# Patient Record
Sex: Female | Born: 1955 | Race: Black or African American | Hispanic: No | Smoking: Never smoker
Health system: Southern US, Community
[De-identification: ages and names within clinical notes are randomized; demographics above are authoritative.]

## PROBLEM LIST (undated history)

## (undated) DIAGNOSIS — J939 Pneumothorax, unspecified: Secondary | ICD-10-CM

## (undated) DIAGNOSIS — I10 Essential (primary) hypertension: Secondary | ICD-10-CM

## (undated) DIAGNOSIS — F0390 Unspecified dementia without behavioral disturbance: Secondary | ICD-10-CM

## (undated) DIAGNOSIS — J45909 Unspecified asthma, uncomplicated: Secondary | ICD-10-CM

## (undated) DIAGNOSIS — J449 Chronic obstructive pulmonary disease, unspecified: Secondary | ICD-10-CM

## (undated) HISTORY — PX: CHEST TUBE INSERTION: SHX231

---

## 2018-01-15 ENCOUNTER — Emergency Department (HOSPITAL_COMMUNITY): Payer: PRIVATE HEALTH INSURANCE

## 2018-01-15 ENCOUNTER — Inpatient Hospital Stay (HOSPITAL_COMMUNITY)
Admission: EM | Admit: 2018-01-15 | Discharge: 2018-01-17 | DRG: 201 | Disposition: A | Discharge status: Home / Self Care | Payer: PRIVATE HEALTH INSURANCE | Attending: Surgery | Admitting: Surgery

## 2018-01-15 ENCOUNTER — Inpatient Hospital Stay (HOSPITAL_COMMUNITY): Payer: PRIVATE HEALTH INSURANCE

## 2018-01-15 ENCOUNTER — Encounter (HOSPITAL_COMMUNITY): Payer: Self-pay | Admitting: Emergency Medicine

## 2018-01-15 DIAGNOSIS — I444 Left anterior fascicular block: Secondary | ICD-10-CM | POA: Diagnosis present

## 2018-01-15 DIAGNOSIS — Z79899 Other long term (current) drug therapy: Secondary | ICD-10-CM

## 2018-01-15 DIAGNOSIS — F0390 Unspecified dementia without behavioral disturbance: Secondary | ICD-10-CM | POA: Diagnosis present

## 2018-01-15 DIAGNOSIS — Z6827 Body mass index (BMI) 27.0-27.9, adult: Secondary | ICD-10-CM

## 2018-01-15 DIAGNOSIS — E668 Other obesity: Secondary | ICD-10-CM | POA: Diagnosis present

## 2018-01-15 DIAGNOSIS — R110 Nausea: Secondary | ICD-10-CM | POA: Diagnosis not present

## 2018-01-15 DIAGNOSIS — J9311 Primary spontaneous pneumothorax: Secondary | ICD-10-CM | POA: Diagnosis present

## 2018-01-15 DIAGNOSIS — J939 Pneumothorax, unspecified: Secondary | ICD-10-CM | POA: Diagnosis present

## 2018-01-15 DIAGNOSIS — J449 Chronic obstructive pulmonary disease, unspecified: Secondary | ICD-10-CM | POA: Diagnosis present

## 2018-01-15 DIAGNOSIS — I10 Essential (primary) hypertension: Secondary | ICD-10-CM | POA: Diagnosis present

## 2018-01-15 HISTORY — DX: Unspecified dementia, unspecified severity, without behavioral disturbance, psychotic disturbance, mood disturbance, and anxiety: F03.90

## 2018-01-15 HISTORY — DX: Pneumothorax, unspecified: J93.9

## 2018-01-15 HISTORY — DX: Essential (primary) hypertension: I10

## 2018-01-15 LAB — CBC WITH DIFFERENTIAL/PLATELET
ABS IMMATURE GRANULOCYTES: 0 10*3/uL (ref 0.0–0.1)
BASOS PCT: 1 %
Basophils Absolute: 0.1 10*3/uL (ref 0.0–0.1)
EOS ABS: 0.5 10*3/uL (ref 0.0–0.7)
Eosinophils Relative: 5 %
HEMATOCRIT: 47.5 % — AB (ref 36.0–46.0)
Hemoglobin: 14.9 g/dL (ref 12.0–15.0)
IMMATURE GRANULOCYTES: 0 %
Lymphocytes Relative: 27 %
Lymphs Abs: 2.5 10*3/uL (ref 0.7–4.0)
MCH: 29.9 pg (ref 26.0–34.0)
MCHC: 31.4 g/dL (ref 30.0–36.0)
MCV: 95.2 fL (ref 78.0–100.0)
MONO ABS: 1 10*3/uL (ref 0.1–1.0)
MONOS PCT: 11 %
NEUTROS ABS: 5.3 10*3/uL (ref 1.7–7.7)
Neutrophils Relative %: 56 %
PLATELETS: 261 10*3/uL (ref 150–400)
RBC: 4.99 MIL/uL (ref 3.87–5.11)
RDW: 12.2 % (ref 11.5–15.5)
WBC: 9.4 10*3/uL (ref 4.0–10.5)

## 2018-01-15 LAB — TROPONIN I: Troponin I: 0.03 ng/mL (ref <0.03)

## 2018-01-15 LAB — COMPREHENSIVE METABOLIC PANEL
ALT: 15 U/L (ref 0–44)
ANION GAP: 12 (ref 5–15)
AST: 26 U/L (ref 15–41)
Albumin: 3.9 g/dL (ref 3.5–5.0)
Alkaline Phosphatase: 110 U/L (ref 38–126)
BILIRUBIN TOTAL: 0.4 mg/dL (ref 0.3–1.2)
BUN: 14 mg/dL (ref 8–23)
CALCIUM: 9.5 mg/dL (ref 8.9–10.3)
CO2: 24 mmol/L (ref 22–32)
Chloride: 105 mmol/L (ref 98–111)
Creatinine, Ser: 0.9 mg/dL (ref 0.44–1.00)
Glucose, Bld: 149 mg/dL — ABNORMAL HIGH (ref 70–99)
POTASSIUM: 3.1 mmol/L — AB (ref 3.5–5.1)
Sodium: 141 mmol/L (ref 135–145)
TOTAL PROTEIN: 8.3 g/dL — AB (ref 6.5–8.1)

## 2018-01-15 LAB — BRAIN NATRIURETIC PEPTIDE: B Natriuretic Peptide: 17.8 pg/mL (ref 0.0–100.0)

## 2018-01-15 LAB — I-STAT TROPONIN, ED: Troponin i, poc: 0.01 ng/mL (ref 0.00–0.08)

## 2018-01-15 LAB — PROTIME-INR
INR: 1.07
PROTHROMBIN TIME: 13.8 s (ref 11.4–15.2)

## 2018-01-15 MED ORDER — LIDOCAINE HCL (PF) 2 % IJ SOLN
INTRAMUSCULAR | Status: AC
Start: 2018-01-15 — End: 2018-01-15
  Filled 2018-01-15: qty 2

## 2018-01-15 MED ORDER — MORPHINE SULFATE (PF) 4 MG/ML IV SOLN
2.0000 mg | Freq: Once | INTRAVENOUS | Status: AC
  Administered 2018-01-15: 2 mg via INTRAVENOUS
  Filled 2018-01-15: qty 1

## 2018-01-15 MED ORDER — FENTANYL CITRATE (PF) 100 MCG/2ML IJ SOLN
INTRAMUSCULAR | Status: AC | PRN
  Administered 2018-01-15: 100 ug via INTRAVENOUS

## 2018-01-15 MED ORDER — ETOMIDATE 2 MG/ML IV SOLN
5.0000 mg | Freq: Once | INTRAVENOUS | Status: AC
  Administered 2018-01-15: 5 mg via INTRAVENOUS
  Filled 2018-01-15: qty 10

## 2018-01-15 MED ORDER — FENTANYL CITRATE (PF) 100 MCG/2ML IJ SOLN
INTRAMUSCULAR | Status: AC
  Filled 2018-01-15: qty 2

## 2018-01-15 MED ORDER — OXYCODONE HCL 5 MG PO TABS
5.0000 mg | ORAL_TABLET | ORAL | Status: DC | PRN
  Administered 2018-01-15 – 2018-01-17 (×3): 5 mg via ORAL
  Filled 2018-01-15 (×3): qty 1

## 2018-01-15 MED ORDER — FENTANYL CITRATE (PF) 100 MCG/2ML IJ SOLN
50.0000 ug | Freq: Once | INTRAMUSCULAR | Status: AC
Start: 2018-01-15 — End: 2018-01-15
  Administered 2018-01-15: 50 ug via INTRAVENOUS
  Filled 2018-01-15: qty 2

## 2018-01-15 MED ORDER — ENOXAPARIN SODIUM 40 MG/0.4ML ~~LOC~~ SOLN
40.0000 mg | SUBCUTANEOUS | Status: DC
  Administered 2018-01-15 – 2018-01-16 (×2): 40 mg via SUBCUTANEOUS
  Filled 2018-01-15 (×2): qty 0.4

## 2018-01-15 MED ORDER — ALBUTEROL SULFATE (2.5 MG/3ML) 0.083% IN NEBU
5.0000 mg | INHALATION_SOLUTION | Freq: Once | RESPIRATORY_TRACT | Status: DC
  Filled 2018-01-15: qty 6

## 2018-01-15 MED ORDER — ETOMIDATE 2 MG/ML IV SOLN
INTRAVENOUS | Status: AC
Start: 2018-01-15 — End: 2018-01-15
  Filled 2018-01-15: qty 10

## 2018-01-15 MED ORDER — ETOMIDATE 2 MG/ML IV SOLN
INTRAVENOUS | Status: AC | PRN
  Administered 2018-01-15 (×2): 5 mg via INTRAVENOUS

## 2018-01-15 MED ORDER — SODIUM CHLORIDE 0.9% FLUSH
3.0000 mL | Freq: Two times a day (BID) | INTRAVENOUS | Status: DC
  Administered 2018-01-15 – 2018-01-16 (×4): 3 mL via INTRAVENOUS

## 2018-01-15 NOTE — ED Notes (Signed)
Upon entry to room chest tube noted to be out of bandages on R axillary area. MD notified, Pt in NAD, VSS.

## 2018-01-15 NOTE — ED Notes (Signed)
Spoke with Dr Laneta SimmersBartle and updated on trop and x-ray results.  He will place new admit order.

## 2018-01-15 NOTE — ED Notes (Signed)
Attempted report 

## 2018-01-15 NOTE — Progress Notes (Signed)
Pt arrived with neb tx running. Per EMS, tx had just been started. Lung sounds clear/diminished on L, Absent on R. Post tx, pt placed on 4L [place]. O2 increased to 6L per SpO2 low 90's.

## 2018-01-15 NOTE — ED Notes (Signed)
Dr Laneta SimmersBartle paged and responded.  Orders placed.

## 2018-01-15 NOTE — ED Provider Notes (Signed)
[facility] Provider Note   CSN: [id number] Arrival date & time: 01/15/18  0215     History   Chief Complaint Chief Complaint  Patient presents with  . Respiratory Distress   Level 5 caveat due to acuity of condition HPI Carolyn Huynh is a 62 y.o. female.  The history is provided by the patient and a relative.  Shortness of Breath  This is a new problem. Duration: Just prior to arrival. The problem has been rapidly worsening. Associated symptoms include cough. She has had prior hospitalizations. Associated medical issues include COPD.  Patient presents with shortness of breath.  Per family, she had an episode of coughing and then became acutely short of breath. She had been at her baseline.  [place] and [place] no local care.  Family reports she has mild dementia, and has had lung issues before.  Family thinks she has had partial lung surgery before.  She has  also required chest tube previously  Past Medical History:  Diagnosis Date  . Dementia   . Hypertension   . Pneumothorax     There are no active problems to display for this patient.   Past Surgical History:  Procedure Laterality Date  . CHEST TUBE INSERTION       OB History   None      Home Medications    Prior to Admission medications   Medication Sig Start Date End Date Taking? Authorizing Provider  albuterol (PROVENTIL HFA;VENTOLIN HFA) 108 (90 Base) MCG/ACT inhaler Inhale 1-2 puffs into the lungs every 6 (six) hours as needed for wheezing or shortness of breath.   Yes [provider]    Family History No family history on file.  Social History Social History   Tobacco Use  . Smoking status: Never Smoker  . Smokeless tobacco: Never Used  Substance Use Topics  . Alcohol use: Not Currently  . Drug use: Never     Allergies   Patient has no known allergies.   Review of Systems Review of Systems  Unable to  perform ROS: Acuity of condition  Respiratory: Positive for cough and shortness of breath.      Physical Exam Updated Vital Signs BP (!) 162/86 (BP Location: Right Arm)   Pulse (!) 122   Temp 98 F (36.7 C) (Oral)   Resp (!) 2   Wt 77.1 kg (170 lb)   SpO2 92%   Physical Exam CONSTITUTIONAL: Elderly, respiratory distress noted HEAD: Normocephalic/atraumatic EYES: EOMI ENMT: Mucous membranes moist NECK: supple no meningeal signs SPINE/BACK:entire spine nontender CV: Tachycardic LUNGS: Tachypnea, decreased breath sounds on the right, wheezing left lungs ABDOMEN: soft, nontender, obese GU:no cva tenderness NEURO: Pt is awake/alert/appropriate, moves all extremitiesx4.   EXTREMITIES: pulses normal/equal, full ROM SKIN: warm, color normal PSYCH: anxious  ED Treatments / Results  Labs (all labs ordered are listed, but only abnormal results are displayed) Labs Reviewed  CBC WITH DIFFERENTIAL/PLATELET - Abnormal; Notable for the following components:      Result Value   HCT 47.5 (*)    All other components within normal limits  COMPREHENSIVE METABOLIC PANEL - Abnormal; Notable for the following components:   Potassium 3.1 (*)    Glucose, Bld 149 (*)    Total Protein 8.3 (*)    All other components within normal limits  BRAIN NATRIURETIC PEPTIDE  PROTIME-INR  I-STAT TROPONIN, ED    EKG EKG Interpretation  Date/Time:  Saturday January 15 2018 05:35:58 EDT Ventricular Rate:  84 PR Interval:    QRS Duration: 88 QT Interval:  377 QTC Calculation: 446 R Axis:   -59 Text Interpretation:  Sinus rhythm Left anterior fascicular block Abnormal R-wave progression, late transition Borderline T abnormalities, anterior leads No previous ECGs available Confirmed by Zadie Rhine ([id number]) on 01/15/2018 6:18:08 AM   Radiology Dg Chest Port 1 View  Result Date: 01/15/2018 CLINICAL DATA:  Chest tube came out. EXAM: PORTABLE CHEST 1 VIEW COMPARISON:  Radiographs earlier this day.  FINDINGS: Right-sided chest tube is no longer present. Small right apical and lateral pneumothorax, slightly increased in size from prior exam, with small inferior component not visualized. No recurrent mediastinal shift. Again seen streaky right basilar opacities. Unchanged heart size and mediastinal contours. Postsurgical change in the left lung with patchy opacities in the perihilar region. IMPRESSION: 1. Slight increased size of right pneumothorax after chest tube came out, remains small in size. Recommend continued radiographic follow-up. 2. Improving right basilar atelectasis. 3. Patchy opacities throughout the left lung, unchanged from most recent comparison. Electronically Signed   By: Rubye Oaks M.D.   On: 01/15/2018 06:23   Dg Chest Port 1 View  Result Date: 01/15/2018 CLINICAL DATA:  Pneumothorax, chest tube placement. EXAM: PORTABLE CHEST 1 VIEW COMPARISON:  Radiograph earlier this day. FINDINGS: Right-sided chest tube in place with significant decreased size of right pneumothorax. The side port of the chest tube is likely in the chest wall. Small residual pneumothorax apical and laterally. Improved and near completely resolved mediastinal shift. Streaky right basilar opacities. Surgical sutures in the left perihilar region. Patchy opacity throughout the left lung, with some improvement. IMPRESSION: 1. Significant decreased size right pneumothorax post chest tube placement with small residual apical and lateral component. Near completely resolved mediastinal shift. The side port of the chest tube is likely within the soft tissues, recommend repositioning. 2. Streaky right lower lobe opacity is likely atelectasis. 3. Patchy opacities throughout the left lung with improvement, likely improving atelectasis. Electronically Signed   By: Rubye Oaks M.D.   On: 01/15/2018 05:11   Dg Chest Portable 1 View  Result Date: 01/15/2018 CLINICAL DATA:  Shortness of breath. EXAM: PORTABLE CHEST 1 VIEW  COMPARISON:  None. FINDINGS: Large right sided pneumothorax with right lung collapse down medially. There does appear to be some tethering a in the right upper lung. Cardiomediastinal anatomy is shifted to the left. Left hemidiaphragm is elevated. There are fairly extensive staple lines in the left upper lung suggesting prior wedge resection or lobectomy. Patchy opacity in the left lung may be atelectatic although infection possible. The visualized bony structures of the thorax are intact. Telemetry leads overlie the chest. IMPRESSION: Large right pneumothorax with shift of cardiomediastinal anatomy to the left, raising concern for tension pneumothorax. Postsurgical changes in the left lung with diffuse patchy left lung opacity potentially atelectasis. Critical Value/emergent results were called by me at the time of interpretation on 01/15/2018 at 2:54 am to Dr. Zadie Rhine , who verbally acknowledged these results. Electronically Signed   By: Kennith Center M.D.   On: 01/15/2018 02:55    Procedures .Sedation Date/Time: 01/15/2018 3:42 AM Performed by: Zadie Rhine, MD Authorized by: Zadie Rhine, MD   Consent:    Consent obtained:  Written   Consent given by:  Patient   Alternatives discussed:  Analgesia without sedation Universal protocol:    Immediately prior to procedure a time out was called: yes   Indications:  Procedure performed:  Chest tube placement   Procedure necessitating sedation performed by:  Physician performing sedation   Intended level of sedation:  Deep Pre-sedation assessment:    Time since last food or drink:  4 hrs   ASA classification: class 2 - patient with mild systemic disease     Neck mobility: normal     Mouth opening:  2 finger widths   Mallampati score:  II - soft palate, uvula, fauces visible   Pre-sedation assessments completed and reviewed: cardiovascular function, mental status and pain level     Pre-sedation assessment completed:  01/15/2018 3:43  AM Immediate pre-procedure details:    Reassessment: Patient reassessed immediately prior to procedure     Reviewed: vital signs, relevant labs/tests and NPO status     Verified: bag valve mask available   Procedure details (see MAR for exact dosages):    Sedation:  Etomidate   Analgesia:  Fentanyl   Total Provider sedation time (minutes):  20 Post-procedure details:    Post-sedation assessment completed:  01/15/2018 4:50 AM   Attendance: Constant attendance by certified staff until patient recovered     Recovery: Patient returned to pre-procedure baseline     Post-sedation assessments completed and reviewed: airway patency, cardiovascular function, mental status, nausea/vomiting and respiratory function     Patient is stable for discharge or admission: yes     Patient tolerance:  Tolerated well, no immediate complications CHEST TUBE INSERTION Date/Time: 01/15/2018 3:43 AM Performed by: Zadie Rhine, MD Authorized by: Zadie Rhine, MD   Consent:    Consent obtained:  Written   Consent given by:  Patient   Alternatives discussed:  No treatment Pre-procedure details:    Skin preparation:  ChloraPrep Sedation:    Sedation type:  Deep Anesthesia (see MAR for exact dosages):    Anesthesia method:  Local infiltration   Local anesthetic:  Lidocaine 2% w/o epi Procedure details:    Placement location:  R lateral   Scalpel size:  11   Tube size (Fr):  20   Dissection instrument:  Kelly clamp and finger   Ultrasound guidance: no     Drainage characteristics:  Air only   Suture material:  0 silk   Dressing:  Xeroform gauze Post-procedure details:    Patient tolerance of procedure:  Tolerated well, no immediate complications  CRITICAL CARE Performed by: Joya Gaskins Total critical care time: 45 minutes Critical care time was exclusive of separately billable procedures and treating other patients. Critical care was necessary to treat or prevent imminent or life-threatening  deterioration. Critical care was time spent personally by me on the following activities: development of treatment plan with patient and/or surrogate as well as nursing, discussions with consultants, evaluation of patient's response to treatment, examination of patient, obtaining history from patient or surrogate, ordering and performing treatments and interventions, ordering and review of laboratory studies, ordering and review of radiographic studies, pulse oximetry and re-evaluation of patient's condition.   Medications Ordered in ED Medications  albuterol (PROVENTIL) (2.5 MG/3ML) 0.083% nebulizer solution 5 mg (5 mg Nebulization Not Given 01/15/18 0441)  lidocaine (XYLOCAINE) 2 % injection (has no administration in time range)  fentaNYL (SUBLIMAZE) 100 MCG/2ML injection (has no administration in time range)  fentaNYL (SUBLIMAZE) injection 50 mcg (50 mcg Intravenous Given 01/15/18 0343)  etomidate (AMIDATE) injection 5 mg (5 mg Intravenous Given 01/15/18 0356)  etomidate (AMIDATE) injection (5 mg Intravenous Given 01/15/18 0406)  fentaNYL (SUBLIMAZE) injection (100 mcg Intravenous Given 01/15/18 0410)  Initial Impression / Assessment and Plan / ED Course  I have reviewed the triage vital signs and the nursing notes.  Pertinent labs & imaging results that were available during my care of the patient were reviewed by me and considered in my medical decision making (see chart for details).     Patient seen on arrival I immediately recognized that she had a right large pneumothorax. She was placed on oxygen, and I consulted cardiothoracic surgery Dr. Laneta Simmers After discussion, we agreed to have me proceed with placing a 20 French tube thoracostomy.  Patient and family were able to consent for this procedure.  I used etomidate for procedural sedation.  Patient tolerated sedation and procedure very well.  A rush of air was noticed when I placed the chest tube and patient's vital signs immediately  improved. I  Was able to secure the tube without difficulty.  However while waiting in the ED the tube fell out movement.  Patient has had no worsening.  Her vital signs are appropriate.  Repeat chest x-ray shows small pneumothorax.  She is not hypoxic or tachycardic.  Discussed the case with Dr. Laneta Simmers, we will elect not to replace the tube at this time.  She will need to have serial radiographs.  He also mentioned that since she has had multiple tube thoracostomies previously she may end up needing a VATS procedure Location of the tube was left with sutures in place.  Patient is resting comfortably awaiting admission Final Clinical Impressions(s) / ED Diagnoses   Final diagnoses:  Primary spontaneous pneumothorax    ED Discharge Orders    None       Zadie Rhine, MD 01/15/18 [phone]

## 2018-01-15 NOTE — ED Notes (Signed)
Lunch tray ordered 

## 2018-01-15 NOTE — ED Triage Notes (Signed)
Pt transported from home by [facility], per EMS pt c/o shob tonight, no lung sounds can be heard on R side, # 18 est, Albuterol 15mg , atr 1, Solumedrol 125mg , Mg 2gm given. Pt baseline confused, per daughter they just moved to the area d/t dementia. Hx of pneumothorax

## 2018-01-15 NOTE — H&P (Signed)
Carolyn Huynh is an 62 y.o. female.   Chief Complaint: Spontaneous right pneumothorax HPI:   The patient is a 62 year old woman with no prior smoking history who presented to the emergency room overnight after developing shortness of breath following a coughing episode.  A chest x-ray showed a large right pneumothorax with some component of tension and shift of the mediastinum to the left.  A chest tube was inserted by the emergency room physician.  A follow-up chest x-ray showed near complete resolution of the pneumothorax but unfortunately due to the laxity of the patient's chest wall tissue when she sat up the chest tube was essentially out of the pleural space.  Since the pneumothorax was nearly completely resolved the tube was removed.  She has had a couple follow-up chest x-rays since then which have shown no reaccumulation of the pneumothorax and it is about 10% on the most recent chest x-ray.  She also developed some substernal chest discomfort in the emergency room and had an electrocardiogram that was unremarkable and negative troponin level x2.  Her chest discomfort resolved with pain medicine.  She reportedly had some family present with her in the emergency room but family is not here at this time.  The patient reports that she had a chest tube in the past and also had surgery on her left chest in 2000.  She does not remember any of the details of this and does have some history of dementia.  Past Medical History:  Diagnosis Date  . Dementia   . Hypertension   . Pneumothorax     Past Surgical History:  Procedure Laterality Date  . CHEST TUBE INSERTION      No family history on file. Social History:  reports that she has never smoked. She has never used smokeless tobacco. She reports that she drank alcohol. She reports that she does not use drugs.  Allergies: No Known Allergies  Medications Prior to Admission  Medication Sig Dispense Refill  . albuterol (PROVENTIL  HFA;VENTOLIN HFA) 108 (90 Base) MCG/ACT inhaler Inhale 1-2 puffs into the lungs every 6 (six) hours as needed for wheezing or shortness of breath.      Results for orders placed or performed during the hospital encounter of 01/15/18 (from the past 48 hour(s))  I-Stat Troponin, ED (not at [facility])     Status: None   Collection Time: 01/15/18  2:33 AM  Result Value Ref Range   Troponin i, poc 0.01 0.00 - 0.08 ng/mL   Comment 3            Comment: Due to the release kinetics of cTnI, a negative result within the first hours of the onset of symptoms does not rule out myocardial infarction with certainty. If myocardial infarction is still suspected, repeat the test at appropriate intervals.   CBC with Differential     Status: Abnormal   Collection Time: 01/15/18  2:35 AM  Result Value Ref Range   WBC 9.4 4.0 - 10.5 K/uL   RBC 4.99 3.87 - 5.11 MIL/uL   Hemoglobin 14.9 12.0 - 15.0 g/dL   HCT 47.5 (H) 36.0 - 46.0 %   MCV 95.2 78.0 - 100.0 fL   MCH 29.9 26.0 - 34.0 pg   MCHC 31.4 30.0 - 36.0 g/dL   RDW 12.2 11.5 - 15.5 %   Platelets 261 150 - 400 K/uL   Neutrophils Relative % 56 %   Neutro Abs 5.3 1.7 - 7.7 K/uL   Lymphocytes Relative  27 %   Lymphs Abs 2.5 0.7 - 4.0 K/uL   Monocytes Relative 11 %   Monocytes Absolute 1.0 0.1 - 1.0 K/uL   Eosinophils Relative 5 %   Eosinophils Absolute 0.5 0.0 - 0.7 K/uL   Basophils Relative 1 %   Basophils Absolute 0.1 0.0 - 0.1 K/uL   Immature Granulocytes 0 %   Abs Immature Granulocytes 0.0 0.0 - 0.1 K/uL    Comment: Performed at [facility], [place] [address], [place], [place] [address]  Brain natriuretic peptide     Status: None   Collection Time: 01/15/18  2:35 AM  Result Value Ref Range   B Natriuretic Peptide 17.8 0.0 - 100.0 pg/mL    Comment: Performed at [facility] [address], [place], [place] [address]  Comprehensive metabolic panel     Status: Abnormal   Collection Time: 01/15/18  2:35 AM  Result Value Ref Range    Sodium 141 135 - 145 mmol/L   Potassium 3.1 (L) 3.5 - 5.1 mmol/L   Chloride 105 98 - 111 mmol/L   CO2 24 22 - 32 mmol/L   Glucose, Bld 149 (H) 70 - 99 mg/dL   BUN 14 8 - 23 mg/dL   Creatinine, Ser 0.90 0.44 - 1.00 mg/dL   Calcium 9.5 8.9 - 10.3 mg/dL   Total Protein 8.3 (H) 6.5 - 8.1 g/dL   Albumin 3.9 3.5 - 5.0 g/dL   AST 26 15 - 41 U/L   ALT 15 0 - 44 U/L   Alkaline Phosphatase 110 38 - 126 U/L   Total Bilirubin 0.4 0.3 - 1.2 mg/dL   GFR calc non Af Amer >60 >60 mL/min   GFR calc Af Amer >60 >60 mL/min    Comment: (NOTE) The eGFR has been calculated using the CKD EPI equation. This calculation has not been validated in all clinical situations. eGFR's persistently <60 mL/min signify possible Chronic Kidney Disease.    Anion gap 12 5 - 15    Comment: Performed at [facility] [address], [place], [place] [address]  Protime-INR     Status: None   Collection Time: 01/15/18  2:43 AM  Result Value Ref Range   Prothrombin Time 13.8 11.4 - 15.2 seconds   INR 1.07     Comment: Performed at [facility] [address], [place], [place] [address]  Troponin I     Status: Abnormal   Collection Time: 01/15/18  8:54 AM  Result Value Ref Range   Troponin I 0.03 (HH) <0.03 ng/mL    Comment: CRITICAL RESULT CALLED TO, READ BACK BY AND VERIFIED WITH: Arville Lime RN AT 1031 01/15/18 BY [facility] Performed at [facility], [place] [address], [place], [place] [address]    Dg Chest Port 1 View  Result Date: 01/15/2018 CLINICAL DATA:  Pneumothorax EXAM: PORTABLE CHEST 1 VIEW COMPARISON:  Chest x-rays from earlier same day. FINDINGS: The small RIGHT apical pneumothorax appears stable compared to the most recent chest x-ray obtained at 8:25 a.m., perhaps slightly smaller compared to the earlier chest x-ray labeled 5:49 a.m. Heart size and mediastinal contours are stable. No new lung findings. Postsurgical changes again noted on the LEFT. IMPRESSION: 1. Small pneumothorax at  the RIGHT lung apex, not significantly changed compared to the most recent chest x-ray obtained at 8:25 a.m., likely smaller compared to the earlier chest x-ray obtained at 5:49 a.m. 2. No new findings. Electronically Signed  By: Franki Cabot M.D.   On: 01/15/2018 12:11   Dg Chest Portable 1 View  Result Date: 01/15/2018 CLINICAL DATA:  Follow-up pneumothorax EXAM: PORTABLE CHEST 1 VIEW COMPARISON:  01/15/2018 FINDINGS: Cardiac shadow is stable. There are changes consistent with small right-sided pneumothorax. The overall appearance is stable. Postsurgical changes on the left are noted with associated scarring. No bony abnormality is noted. IMPRESSION: Stable small right pneumothorax Electronically Signed   By: Inez Catalina M.D.   On: 01/15/2018 08:39   Dg Chest Port 1 View  Result Date: 01/15/2018 CLINICAL DATA:  Chest tube came out. EXAM: PORTABLE CHEST 1 VIEW COMPARISON:  Radiographs earlier this day. FINDINGS: Right-sided chest tube is no longer present. Small right apical and lateral pneumothorax, slightly increased in size from prior exam, with small inferior component not visualized. No recurrent mediastinal shift. Again seen streaky right basilar opacities. Unchanged heart size and mediastinal contours. Postsurgical change in the left lung with patchy opacities in the perihilar region. IMPRESSION: 1. Slight increased size of right pneumothorax after chest tube came out, remains small in size. Recommend continued radiographic follow-up. 2. Improving right basilar atelectasis. 3. Patchy opacities throughout the left lung, unchanged from most recent comparison. Electronically Signed   By: Jeb Levering M.D.   On: 01/15/2018 06:23   Dg Chest Port 1 View  Result Date: 01/15/2018 CLINICAL DATA:  Pneumothorax, chest tube placement. EXAM: PORTABLE CHEST 1 VIEW COMPARISON:  Radiograph earlier this day. FINDINGS: Right-sided chest tube in place with significant decreased size of right pneumothorax. The  side port of the chest tube is likely in the chest wall. Small residual pneumothorax apical and laterally. Improved and near completely resolved mediastinal shift. Streaky right basilar opacities. Surgical sutures in the left perihilar region. Patchy opacity throughout the left lung, with some improvement. IMPRESSION: 1. Significant decreased size right pneumothorax post chest tube placement with small residual apical and lateral component. Near completely resolved mediastinal shift. The side port of the chest tube is likely within the soft tissues, recommend repositioning. 2. Streaky right lower lobe opacity is likely atelectasis. 3. Patchy opacities throughout the left lung with improvement, likely improving atelectasis. Electronically Signed   By: Jeb Levering M.D.   On: 01/15/2018 05:11   Dg Chest Portable 1 View  Result Date: 01/15/2018 CLINICAL DATA:  Shortness of breath. EXAM: PORTABLE CHEST 1 VIEW COMPARISON:  None. FINDINGS: Large right sided pneumothorax with right lung collapse down medially. There does appear to be some tethering a in the right upper lung. Cardiomediastinal anatomy is shifted to the left. Left hemidiaphragm is elevated. There are fairly extensive staple lines in the left upper lung suggesting prior wedge resection or lobectomy. Patchy opacity in the left lung may be atelectatic although infection possible. The visualized bony structures of the thorax are intact. Telemetry leads overlie the chest. IMPRESSION: Large right pneumothorax with shift of cardiomediastinal anatomy to the left, raising concern for tension pneumothorax. Postsurgical changes in the left lung with diffuse patchy left lung opacity potentially atelectasis. Critical Value/emergent results were called by me at the time of interpretation on 01/15/2018 at 2:54 am to Dr. Ripley Fraise , who verbally acknowledged these results. Electronically Signed   By: Misty Stanley M.D.   On: 01/15/2018 02:55    Review of  Systems  Unable to perform ROS: Dementia    Blood pressure (!) 173/85, pulse 91, temperature 98 F (36.7 C), temperature source Oral, resp. rate (!) 29, height '5\' 4"'$  (1.626 m), weight  77.1 kg (170 lb), SpO2 100 %. Physical Exam  Constitutional: She is oriented to person, place, and time. She appears well-developed and well-nourished. No distress.  HENT:  Head: Normocephalic and atraumatic.  Mouth/Throat: Oropharynx is clear and moist.  Eyes: Pupils are equal, round, and reactive to light. EOM are normal.  Neck: Normal range of motion. Neck supple. No JVD present.  Cardiovascular: Normal rate, regular rhythm and normal heart sounds.  No murmur heard. Respiratory: Effort normal and breath sounds normal. No respiratory distress. She has no wheezes. She exhibits no tenderness.  Left posterior lateral thoracotomy scar  GI: Soft. Bowel sounds are normal. She exhibits no distension. There is no tenderness.  Old laparotomy scar  Musculoskeletal: Normal range of motion. She exhibits no edema.  Lymphadenopathy:    She has no cervical adenopathy.  Neurological: She is alert and oriented to person, place, and time.  Skin: Skin is warm and dry.  Psychiatric: She has a normal mood and affect.  She obviously has some dementia.  She has been telling me all kinds of bizarre stories that could not possibly be true.    CLINICAL DATA:  Pneumothorax  EXAM: PORTABLE CHEST 1 VIEW  COMPARISON:  Chest x-rays from earlier same day.  FINDINGS: The small RIGHT apical pneumothorax appears stable compared to the most recent chest x-ray obtained at 8:25 a.m., perhaps slightly smaller compared to the earlier chest x-ray labeled 5:49 a.m.  Heart size and mediastinal contours are stable. No new lung findings. Postsurgical changes again noted on the LEFT.  IMPRESSION: 1. Small pneumothorax at the RIGHT lung apex, not significantly changed compared to the most recent chest x-ray obtained at 8:25 a.m.,  likely smaller compared to the earlier chest x-ray obtained at 5:49 a.m. 2. No new findings.   Electronically Signed   By: Franki Cabot M.D.   On: 01/15/2018 12:11  Assessment/Plan  This 62 year old woman presented with a spontaneous right pneumothorax with complete collapse of the right lung and shift of the mediastinum to the left.  This is been resolved with a chest tube which unfortunately fell out.  Sequential follow-up chest x-rays have shown no reaccumulation of the pneumothorax.  She was admitted for continued observation and will have a follow-up chest x-ray in the morning.  Gaye Pollack, MD 01/15/2018, 1:55 PM

## 2018-01-16 ENCOUNTER — Inpatient Hospital Stay (HOSPITAL_COMMUNITY): Payer: PRIVATE HEALTH INSURANCE

## 2018-01-16 LAB — HIV ANTIBODY (ROUTINE TESTING W REFLEX): HIV Screen 4th Generation wRfx: NONREACTIVE

## 2018-01-16 LAB — GLUCOSE, CAPILLARY: GLUCOSE-CAPILLARY: 104 mg/dL — AB (ref 70–99)

## 2018-01-16 NOTE — Discharge Summary (Signed)
Physician Discharge Summary  Patient ID: Carolyn Huynh MRN: [id number] DOB/AGE: [id number] 64 y.o.  Admit date: 01/15/2018 Discharge date: 01/17/2018  Admission Diagnoses: Patient Active Problem List   Diagnosis Date Noted  . Pneumothorax 01/15/2018    Discharge Diagnoses:  Active Problems:   Pneumothorax   Discharged Condition: good  HPI:  The patient is a 62 year old woman with no prior smoking history who presented to the emergency room overnight after developing shortness of breath following a coughing episode.  A chest x-ray showed a large right pneumothorax with some component of tension and shift of the mediastinum to the left.  A chest tube was inserted by the emergency room physician.  A follow-up chest x-ray showed near complete resolution of the pneumothorax but unfortunately due to the laxity of the patient's chest wall tissue when she sat up the chest tube was essentially out of the pleural space.  Since the pneumothorax was nearly completely resolved the tube was removed.  She has had a couple follow-up chest x-rays since then which have shown no reaccumulation of the pneumothorax and it is about 10% on the most recent chest x-ray.  She also developed some substernal chest discomfort in the emergency room and had an electrocardiogram that was unremarkable and negative troponin level x2.  Her chest discomfort resolved with pain medicine.  She reportedly had some family present with her in the emergency room but family is not here at this time.  The patient reports that she had a chest tube in the past and also had surgery on her left chest in 2000.  She does not remember any of the details of this and does have some history of dementia.    Hospital Course:  Carolyn Huynh is a 62 year old female who was admitted on 01/15/2018 for a pneumothorax with mediastinal shift. A chest tube was inserted nu the emergency room physician however, when the patient sat up the chest tube was pulled out  of the pleural space and subsequently removed. On xray she had near complete resolution of her pneumothorax. We have admitted her for observation. She underwent a chest xray on 01/16/2018 which showed a slightly improved right apical pneumothorax. She is tolerating room air. We encouraged ambulation. On 8/5 her xray was very stable.. Today, she is deemed stable for discharge.    Consults: None  Significant Diagnostic Studies:   CLINICAL DATA:  Pneumothorax.  EXAM: PORTABLE CHEST 1 VIEW  COMPARISON:  01/15/2018  FINDINGS: The cardiomediastinal silhouette is unchanged. Postsurgical changes are again noted in the left upper lung with similar appearance of adjacent lung opacity, presumably scarring. A small right apical pneumothorax has decreased in size. Coarsened appearance of the interstitial markings bilaterally is similar to the prior study and likely reflects an element of chronic lung disease. No sizable pleural effusion is evident.  IMPRESSION: Decreased size of small right apical pneumothorax.   Electronically Signed   By: Sebastian Ache M.D.   On: 01/16/2018 08:04  Treatments:  Right chest tube insertion.   Discharge Exam: Blood pressure (!) 129/59, pulse 61, temperature 98 F (36.7 C), temperature source Oral, resp. rate 14, height 5\' 4"  (1.626 m), weight 73.7 kg (162 lb 7.7 oz), SpO2 94 %.     General appearance: alert, cooperative and no distress Heart: regular rate and rhythm Lungs: clear to auscultation bilaterally Abdomen: soft, non-tender Extremities: no calf tenderness Wound: incis healing well   Disposition: Discharge disposition: 01-Home or Self Care  Discharge Instructions    Discharge patient   Complete by:  As directed    Discharge disposition:  01-Home or Self Care   Discharge patient date:  01/17/2018     Allergies as of 01/17/2018   No Known Allergies     Medication List    TAKE these medications   albuterol 108 (90 Base)  MCG/ACT inhaler Commonly known as:  PROVENTIL HFA;VENTOLIN HFA Inhale 1-2 puffs into the lungs every 6 (six) hours as needed for wheezing or shortness of breath.      Follow-up Information    Alleen BorneBartle, Bryan K, MD Follow up.   Specialty:  Cardiothoracic Surgery Why:  office will contact you.  On the date of the appointment to see Dr. Laneta SimmersBartle obtain a chest [facility] 1/2-hour prior to the appointment.  It is located in the same office complex on the first floor. Contact information: [address] [place] [address] [phone]           Signed: Rowe ClackWayne E Dezmond Downie 01/17/2018, 7:38 AM

## 2018-01-16 NOTE — Progress Notes (Addendum)
      [address]       [place] [id number]             [phone]         Subjective: Had some pain yesterday but the pain medication helped  Objective: Vital signs in last 24 hours: Temp:  [97.9 F (36.6 C)-98.1 F (36.7 C)] 97.9 F (36.6 C) (08/04 0456) Pulse Rate:  [70-95] 74 (08/04 0458) Cardiac Rhythm: Normal sinus rhythm (08/04 0710) Resp:  [17-33] 26 (08/04 0458) BP: (110-173)/(53-85) 120/53 (08/04 0456) SpO2:  [97 %-100 %] 97 % (08/04 0458) Weight:  [74.2 kg (163 lb 9.3 oz)] 74.2 kg (163 lb 9.3 oz) (08/04 0458)     Intake/Output from previous day: 08/03 0701 - 08/04 0700 In: 960 [P.O.:960] Out: -  Intake/Output this shift: No intake/output data recorded.  General appearance: alert, cooperative and no distress Heart: regular rate and rhythm, S1, S2 normal, no murmur, click, rub or gallop Lungs: clear to auscultation bilaterally Abdomen: soft, non-tender; bowel sounds normal; no masses,  no organomegaly Extremities: extremities normal, atraumatic, no cyanosis or edema Wound: chest tube site is c/d/i  Lab Results: Recent Labs    01/15/18 0235  WBC 9.4  HGB 14.9  HCT 47.5*  PLT 261   BMET:  Recent Labs    01/15/18 0235  NA 141  K 3.1*  CL 105  CO2 24  GLUCOSE 149*  BUN 14  CREATININE 0.90  CALCIUM 9.5    PT/INR:  Recent Labs    01/15/18 0243  LABPROT 13.8  INR 1.07   ABG No results found for: PHART, HCO3, TCO2, ACIDBASEDEF, O2SAT CBG (last 3)  Recent Labs    01/16/18 0642  GLUCAP 104*    Assessment/Plan: 1. CV-NSR in the 60s, BP well controlled 2. Pulm-tolerating room air with good oxygen saturation. CXR today shows decreased size of the small right apical pneumothorax. Continue albuterol nebs PRN 3. Blood glucose level well controlled 4. Continue lovenox for DVT prophylaxis.   Plan: CXR this morning is stable. Will plan to get another CXR in the morning and discharge if stable. Of note, the patient is asking  to shower today.    LOS: 1 day    Sharlene Doryessa N Conte 01/16/2018   Chart reviewed, patient examined, agree with above. CXR is stable with small right apical ptx.  She denies chest pain.

## 2018-01-17 ENCOUNTER — Inpatient Hospital Stay (HOSPITAL_COMMUNITY): Payer: PRIVATE HEALTH INSURANCE

## 2018-01-17 LAB — GLUCOSE, CAPILLARY: Glucose-Capillary: 84 mg/dL (ref 70–99)

## 2018-01-17 NOTE — Progress Notes (Signed)
Vitals stable and iv removed and intact. Pt educated and discharged with instructions. Pt has purse and belongings including glasses. Pt tx via wheelchair to meet family. Vitals stable and iv removed and intact.  Lacy DuverneyJennifer Hazeline Charnley, RN

## 2018-01-17 NOTE — Progress Notes (Signed)
[address]       [name] [address]             [phone]         Subjective: Mild nausea, o/w feels pretty well, minor shortness of breath at times  Objective: Vital signs in last 24 hours: Temp:  [98 F (36.7 C)-98.2 F (36.8 C)] 98 F (36.7 C) (08/05 0536) Pulse Rate:  [61-79] 61 (08/05 0536) Cardiac Rhythm: Normal sinus rhythm (08/04 1936) Resp:  [14-26] 14 (08/05 0536) BP: (129-159)/(59-88) 129/59 (08/05 0536) SpO2:  [94 %-100 %] 94 % (08/05 0536) Weight:  [73.7 kg (162 lb 7.7 oz)] 73.7 kg (162 lb 7.7 oz) (08/05 0536)  Hemodynamic parameters for last 24 hours:    Intake/Output from previous day: 08/04 0701 - 08/05 0700 In: 960 [P.O.:960] Out: -  Intake/Output this shift: No intake/output data recorded.  General appearance: alert, cooperative and no distress Heart: regular rate and rhythm Lungs: clear to auscultation bilaterally Abdomen: soft, non-tender Extremities: no calf tenderness Wound: incis healing well  Lab Results: Recent Labs    01/15/18 0235  WBC 9.4  HGB 14.9  HCT 47.5*  PLT 261   BMET:  Recent Labs    01/15/18 0235  NA 141  K 3.1*  CL 105  CO2 24  GLUCOSE 149*  BUN 14  CREATININE 0.90  CALCIUM 9.5    PT/INR:  Recent Labs    01/15/18 0243  LABPROT 13.8  INR 1.07   ABG No results found for: PHART, HCO3, TCO2, ACIDBASEDEF, O2SAT CBG (last 3)  Recent Labs    01/16/18 0642 01/17/18 0624  GLUCAP 104* 84    Meds Scheduled Meds: . albuterol  5 mg Nebulization Once  . enoxaparin (LOVENOX) injection  40 mg Subcutaneous Q24H  . sodium chloride flush  3 mL Intravenous Q12H   Continuous Infusions: PRN Meds:.oxyCODONE  Xrays Portable Chest 1 View  Result Date: 01/16/2018 CLINICAL DATA:  Pneumothorax. EXAM: PORTABLE CHEST 1 VIEW COMPARISON:  01/15/2018 FINDINGS: The cardiomediastinal silhouette is unchanged. Postsurgical changes are again noted in the left upper lung with similar appearance of  adjacent lung opacity, presumably scarring. A small right apical pneumothorax has decreased in size. Coarsened appearance of the interstitial markings bilaterally is similar to the prior study and likely reflects an element of chronic lung disease. No sizable pleural effusion is evident. IMPRESSION: Decreased size of small right apical pneumothorax. Electronically Signed   By: Sebastian Ache M.D.   On: 01/16/2018 08:04   Dg Chest Port 1 View  Result Date: 01/15/2018 CLINICAL DATA:  Pneumothorax EXAM: PORTABLE CHEST 1 VIEW COMPARISON:  Chest x-rays from earlier same day. FINDINGS: The small RIGHT apical pneumothorax appears stable compared to the most recent chest x-ray obtained at 8:25 a.m., perhaps slightly smaller compared to the earlier chest x-ray labeled 5:49 a.m. Heart size and mediastinal contours are stable. No new lung findings. Postsurgical changes again noted on the LEFT. IMPRESSION: 1. Small pneumothorax at the RIGHT lung apex, not significantly changed compared to the most recent chest x-ray obtained at 8:25 a.m., likely smaller compared to the earlier chest x-ray obtained at 5:49 a.m. 2. No new findings. Electronically Signed   By: Bary Richard M.D.   On: 01/15/2018 12:11   Dg Chest Portable 1 View  Result Date: 01/15/2018 CLINICAL DATA:  Follow-up pneumothorax EXAM: PORTABLE CHEST 1 VIEW COMPARISON:  01/15/2018 FINDINGS: Cardiac shadow is stable. There are changes consistent with small  right-sided pneumothorax. The overall appearance is stable. Postsurgical changes on the left are noted with associated scarring. No bony abnormality is noted. IMPRESSION: Stable small right pneumothorax Electronically Signed   By: Alcide CleverMark  Lukens M.D.   On: 01/15/2018 08:39    Assessment/Plan:  1 conts to do well overall, sats good on RA 2 CXR is stable 3 some HTN at times- instructed to follow up with PMD- she understands 4 stable for discharge    LOS: 2 days    Rowe ClackWayne E Bianka Liberati 01/17/2018

## 2018-01-17 NOTE — Discharge Instructions (Signed)

## 2018-01-22 ENCOUNTER — Encounter (HOSPITAL_COMMUNITY): Payer: Self-pay | Admitting: Emergency Medicine

## 2018-01-22 ENCOUNTER — Emergency Department (HOSPITAL_COMMUNITY): Payer: Medicaid - Out of State

## 2018-01-22 ENCOUNTER — Inpatient Hospital Stay (HOSPITAL_COMMUNITY)
Admission: EM | Admit: 2018-01-22 | Discharge: 2018-01-29 | DRG: 200 | Disposition: A | Discharge status: Home / Self Care | Payer: Medicaid - Out of State | Attending: Cardiothoracic Surgery | Admitting: Cardiothoracic Surgery

## 2018-01-22 ENCOUNTER — Other Ambulatory Visit: Payer: Self-pay

## 2018-01-22 ENCOUNTER — Inpatient Hospital Stay (HOSPITAL_COMMUNITY): Payer: Medicaid - Out of State

## 2018-01-22 DIAGNOSIS — J9383 Other pneumothorax: Secondary | ICD-10-CM

## 2018-01-22 DIAGNOSIS — Z886 Allergy status to analgesic agent status: Secondary | ICD-10-CM

## 2018-01-22 DIAGNOSIS — J849 Interstitial pulmonary disease, unspecified: Secondary | ICD-10-CM | POA: Diagnosis present

## 2018-01-22 DIAGNOSIS — J930 Spontaneous tension pneumothorax: Secondary | ICD-10-CM | POA: Diagnosis present

## 2018-01-22 DIAGNOSIS — Z5329 Procedure and treatment not carried out because of patient's decision for other reasons: Secondary | ICD-10-CM | POA: Diagnosis present

## 2018-01-22 DIAGNOSIS — Z6827 Body mass index (BMI) 27.0-27.9, adult: Secondary | ICD-10-CM | POA: Diagnosis not present

## 2018-01-22 DIAGNOSIS — Z419 Encounter for procedure for purposes other than remedying health state, unspecified: Secondary | ICD-10-CM

## 2018-01-22 DIAGNOSIS — J939 Pneumothorax, unspecified: Secondary | ICD-10-CM | POA: Diagnosis present

## 2018-01-22 DIAGNOSIS — F0390 Unspecified dementia without behavioral disturbance: Secondary | ICD-10-CM | POA: Diagnosis present

## 2018-01-22 DIAGNOSIS — J9311 Primary spontaneous pneumothorax: Secondary | ICD-10-CM

## 2018-01-22 DIAGNOSIS — I119 Hypertensive heart disease without heart failure: Secondary | ICD-10-CM | POA: Diagnosis present

## 2018-01-22 DIAGNOSIS — Z09 Encounter for follow-up examination after completed treatment for conditions other than malignant neoplasm: Secondary | ICD-10-CM

## 2018-01-22 DIAGNOSIS — Z9689 Presence of other specified functional implants: Secondary | ICD-10-CM

## 2018-01-22 DIAGNOSIS — R001 Bradycardia, unspecified: Secondary | ICD-10-CM | POA: Diagnosis present

## 2018-01-22 DIAGNOSIS — E669 Obesity, unspecified: Secondary | ICD-10-CM | POA: Diagnosis present

## 2018-01-22 DIAGNOSIS — Z9889 Other specified postprocedural states: Secondary | ICD-10-CM

## 2018-01-22 LAB — BASIC METABOLIC PANEL
ANION GAP: 10 (ref 5–15)
BUN: 10 mg/dL (ref 8–23)
CALCIUM: 9.2 mg/dL (ref 8.9–10.3)
CO2: 28 mmol/L (ref 22–32)
CREATININE: 1.11 mg/dL — AB (ref 0.44–1.00)
Chloride: 104 mmol/L (ref 98–111)
GFR calc non Af Amer: 51 mL/min — ABNORMAL LOW (ref >60)
GFR, EST AFRICAN AMERICAN: 59 mL/min — AB (ref >60)
Glucose, Bld: 121 mg/dL — ABNORMAL HIGH (ref 70–99)
Potassium: 3.8 mmol/L (ref 3.5–5.1)
SODIUM: 142 mmol/L (ref 135–145)

## 2018-01-22 LAB — CBC
HCT: 39.7 % (ref 36.0–46.0)
HEMOGLOBIN: 12.6 g/dL (ref 12.0–15.0)
MCH: 30.2 pg (ref 26.0–34.0)
MCHC: 31.7 g/dL (ref 30.0–36.0)
MCV: 95.2 fL (ref 78.0–100.0)
PLATELETS: 269 10*3/uL (ref 150–400)
RBC: 4.17 MIL/uL (ref 3.87–5.11)
RDW: 12.4 % (ref 11.5–15.5)
WBC: 16.4 10*3/uL — AB (ref 4.0–10.5)

## 2018-01-22 LAB — CREATININE, SERUM
Creatinine, Ser: 0.95 mg/dL (ref 0.44–1.00)
GFR calc Af Amer: >60 mL/min (ref >60)
GFR calc non Af Amer: >60 mL/min (ref >60)

## 2018-01-22 LAB — MRSA PCR SCREENING: MRSA by PCR: NEGATIVE

## 2018-01-22 MED ORDER — KCL IN DEXTROSE-NACL 10-5-0.45 MEQ/L-%-% IV SOLN
INTRAVENOUS | Status: DC
  Administered 2018-01-22 – 2018-01-23 (×2): via INTRAVENOUS
  Filled 2018-01-22 (×2): qty 1000

## 2018-01-22 MED ORDER — SODIUM CHLORIDE 0.9 % IV BOLUS
1000.0000 mL | Freq: Once | INTRAVENOUS | Status: AC
  Administered 2018-01-22: 1000 mL via INTRAVENOUS

## 2018-01-22 MED ORDER — MORPHINE SULFATE (PF) 4 MG/ML IV SOLN
4.0000 mg | Freq: Once | INTRAVENOUS | Status: AC
  Administered 2018-01-22: 4 mg via INTRAVENOUS
  Filled 2018-01-22: qty 1

## 2018-01-22 MED ORDER — ENOXAPARIN SODIUM 40 MG/0.4ML ~~LOC~~ SOLN
40.0000 mg | SUBCUTANEOUS | Status: AC
  Administered 2018-01-22 – 2018-01-23 (×2): 40 mg via SUBCUTANEOUS
  Filled 2018-01-22 (×2): qty 0.4

## 2018-01-22 MED ORDER — FENTANYL CITRATE (PF) 100 MCG/2ML IJ SOLN
100.0000 ug | Freq: Once | INTRAMUSCULAR | Status: AC
Start: 2018-01-22 — End: 2018-01-22
  Administered 2018-01-22: 100 ug via INTRAVENOUS

## 2018-01-22 MED ORDER — DOCUSATE SODIUM 100 MG PO CAPS
100.0000 mg | ORAL_CAPSULE | Freq: Two times a day (BID) | ORAL | Status: DC
  Administered 2018-01-22 – 2018-01-29 (×14): 100 mg via ORAL
  Filled 2018-01-22 (×15): qty 1

## 2018-01-22 MED ORDER — MIDAZOLAM HCL 2 MG/2ML IJ SOLN
1.0000 mg | Freq: Once | INTRAMUSCULAR | Status: AC
  Administered 2018-01-22: 06:00:00 via INTRAVENOUS
  Filled 2018-01-22: qty 2

## 2018-01-22 MED ORDER — ACETAMINOPHEN 650 MG RE SUPP: 650.0000 mg | Freq: Four times a day (QID) | RECTAL | Status: DC | PRN

## 2018-01-22 MED ORDER — FENTANYL CITRATE (PF) 100 MCG/2ML IJ SOLN
50.0000 ug | INTRAMUSCULAR | Status: DC | PRN
  Administered 2018-01-22 – 2018-01-26 (×18): 50 ug via INTRAVENOUS
  Filled 2018-01-22 (×19): qty 2

## 2018-01-22 MED ORDER — IOHEXOL 300 MG/ML  SOLN
75.0000 mL | Freq: Once | INTRAMUSCULAR | Status: AC | PRN
  Administered 2018-01-22: 75 mL via INTRAVENOUS

## 2018-01-22 MED ORDER — LIDOCAINE HCL 2 % IJ SOLN
20.0000 mL | Freq: Once | INTRAMUSCULAR | Status: AC
  Administered 2018-01-22: 400 mg
  Filled 2018-01-22: qty 20

## 2018-01-22 MED ORDER — FENTANYL CITRATE (PF) 100 MCG/2ML IJ SOLN
INTRAMUSCULAR | Status: AC
  Filled 2018-01-22: qty 2

## 2018-01-22 MED ORDER — LEVALBUTEROL HCL 0.63 MG/3ML IN NEBU
0.6300 mg | INHALATION_SOLUTION | Freq: Four times a day (QID) | RESPIRATORY_TRACT | Status: DC | PRN
  Administered 2018-01-27: 0.63 mg via RESPIRATORY_TRACT
  Filled 2018-01-22: qty 3

## 2018-01-22 MED ORDER — HYDROCODONE-ACETAMINOPHEN 5-325 MG PO TABS
1.0000 | ORAL_TABLET | ORAL | Status: DC | PRN
  Administered 2018-01-23 – 2018-01-25 (×6): 2 via ORAL
  Administered 2018-01-25: 1 via ORAL
  Administered 2018-01-26 – 2018-01-27 (×5): 2 via ORAL
  Administered 2018-01-27: 1 via ORAL
  Administered 2018-01-28 (×2): 2 via ORAL
  Administered 2018-01-28: 1 via ORAL
  Administered 2018-01-29 (×2): 2 via ORAL
  Filled 2018-01-22 (×10): qty 2
  Filled 2018-01-22: qty 1
  Filled 2018-01-22 (×2): qty 2
  Filled 2018-01-22: qty 1
  Filled 2018-01-22 (×2): qty 2
  Filled 2018-01-22: qty 1
  Filled 2018-01-22: qty 2

## 2018-01-22 MED ORDER — ONDANSETRON HCL 4 MG/2ML IJ SOLN
4.0000 mg | Freq: Once | INTRAMUSCULAR | Status: AC
  Administered 2018-01-22: 4 mg via INTRAVENOUS
  Filled 2018-01-22: qty 2

## 2018-01-22 MED ORDER — ADULT MULTIVITAMIN W/MINERALS CH
1.0000 | ORAL_TABLET | Freq: Every day | ORAL | Status: DC
  Administered 2018-01-22 – 2018-01-29 (×8): 1 via ORAL
  Filled 2018-01-22 (×8): qty 1

## 2018-01-22 MED ORDER — ACETAMINOPHEN 325 MG PO TABS
650.0000 mg | ORAL_TABLET | Freq: Four times a day (QID) | ORAL | Status: DC | PRN
  Administered 2018-01-22 – 2018-01-27 (×5): 650 mg via ORAL
  Filled 2018-01-22 (×5): qty 2

## 2018-01-22 MED ORDER — POLYETHYLENE GLYCOL 3350 17 G PO PACK
17.0000 g | PACK | Freq: Every day | ORAL | Status: DC | PRN
  Administered 2018-01-25 – 2018-01-27 (×2): 17 g via ORAL
  Filled 2018-01-22 (×3): qty 1

## 2018-01-22 MED ORDER — SODIUM CHLORIDE 0.9% FLUSH
3.0000 mL | Freq: Two times a day (BID) | INTRAVENOUS | Status: DC
  Administered 2018-01-22 – 2018-01-28 (×12): 3 mL via INTRAVENOUS

## 2018-01-22 MED ORDER — ALUM & MAG HYDROXIDE-SIMETH 200-200-20 MG/5ML PO SUSP
30.0000 mL | ORAL | Status: DC | PRN
  Administered 2018-01-22: 30 mL via ORAL
  Filled 2018-01-22: qty 30

## 2018-01-22 MED ORDER — MIDAZOLAM HCL 2 MG/2ML IJ SOLN
1.0000 mg | Freq: Once | INTRAMUSCULAR | Status: AC
  Administered 2018-01-22: 1 mg via INTRAVENOUS

## 2018-01-22 NOTE — ED Notes (Signed)
Carolyn MerinoShery Huynh [phone] (Daughter)

## 2018-01-22 NOTE — ED Notes (Signed)
Patient signed consent form for chest tube placement .

## 2018-01-22 NOTE — H&P (Signed)
[address]       [place] [address]             [phone]        [facility] Medical Record #[id number] Date of Birth: [birth year]  Referring: Dr Patria Mane Primary Care: Patient, [name] Primary Cardiologist:No primary care provider on file.  Chief Complaint:    Chief Complaint  Patient presents with  . Shortness of Breath  Patient examined, chest x-ray images personally reviewed, patient discussed with Dr. Patria Mane for coordination of care  History of Present Illness:     62 year old obese AA female non-smoker presents with recurrent right spontaneous pneumothorax after being hospitalized with same problem last week.  Her x-ray earlier this a.m. showed a 90% right pneumothorax and a 20 French chest tube was placed by Dr. Patria Mane with reexpansion of the lung.  The patient will be admitted for chest tube management and subsequent VATS procedure.  The patient is a 62 year old woman with no prior smoking history who presented to the emergency room overnight after developing shortness of breath following a coughing episode.  A chest x-ray showed a large right pneumothorax with some component of tension and shift of the mediastinum to the left.  A chest tube was inserted by the emergency room physician.  A follow-up chest x-ray showed near complete resolution of the pneumothorax but unfortunately due to the laxity of the patient's chest wall tissue when she sat up the chest tube was essentially out of the pleural space.  Since the pneumothorax was nearly completely resolved the tube was removed.  She has had a couple follow-up chest x-rays since then which have shown no reaccumulation of the pneumothorax and it is about 10% on the most recent chest x-ray.  She also developed some substernal chest discomfort in the emergency room and had an electrocardiogram that was unremarkable and negative troponin level x2.  Her chest discomfort resolved with pain medicine.   She reportedly had some family present with her in the emergency room but family is not here at this time.  The patient reports that she had a chest tube in the past and also had surgery on her left chest in 2000.  She does not remember any of the details of this and does have some history of dementia.    Current Activity/ Functional Status: Patient with a sedentary functional status lives with daughter   Zubrod Score: At the time of surgery this patient's most appropriate activity status/level should be described as: []     0    Normal activity, no symptoms []     1    Restricted in physical strenuous activity but ambulatory, able to do out light work []     2    Ambulatory and capable of self care, unable to do work activities, up and about                 more than 50%  Of the time                            []     3    Only limited self care, in bed greater than 50% of waking hours []     4    Completely disabled, no self care, confined to bed or chair []     5    Moribund  Past Medical History:  Diagnosis Date  .  Dementia   . Hypertension   . Pneumothorax     Past Surgical History:  Procedure Laterality Date  . CHEST TUBE INSERTION      Social History   Tobacco Use  Smoking Status Never Smoker  Smokeless Tobacco Never Used   Social History   Substance and Sexual Activity  Alcohol Use Not Currently     Allergies  Allergen Reactions  . Aspirin Shortness Of Breath    Current Facility-Administered Medications  Medication Dose Route Frequency Provider Last Rate Last Dose  . acetaminophen (TYLENOL) tablet 650 mg  650 mg Oral Q6H PRN Donata Clay, Theron Arista, MD       Or  . acetaminophen (TYLENOL) suppository 650 mg  650 mg Rectal Q6H PRN Donata Clay, Theron Arista, MD      . dextrose 5 % and 0.45 % NaCl with KCl 10 mEq/L infusion   Intravenous Continuous Donata Clay, Theron Arista, MD      . docusate sodium (COLACE) capsule 100 mg  100 mg Oral BID Donata Clay, Theron Arista, MD      . enoxaparin (LOVENOX)  injection 40 mg  40 mg Subcutaneous Q24H Donata Clay, Theron Arista, MD      . HYDROcodone-acetaminophen (NORCO/VICODIN) 5-325 MG per tablet 1-2 tablet  1-2 tablet Oral Q4H PRN Donata Clay, Theron Arista, MD      . levalbuterol Pauline Aus) nebulizer solution 0.63 mg  0.63 mg Nebulization Q6H PRN Kerin Perna, MD      . multivitamin with minerals tablet 1 tablet  1 tablet Oral Daily Donata Clay, Theron Arista, MD      . polyethylene glycol [facility] / Ethelene Hal) packet 17 g  17 g Oral Daily PRN Donata Clay, Theron Arista, MD      . sodium chloride flush (NS) 0.9 % injection 3 mL  3 mL Intravenous Q12H Kerin Perna, MD       Current Outpatient Medications  Medication Sig Dispense Refill  . albuterol (PROVENTIL HFA;VENTOLIN HFA) 108 (90 Base) MCG/ACT inhaler Inhale 1-2 puffs into the lungs every 6 (six) hours as needed for wheezing or shortness of breath.       (Not in a hospital admission)  No family history on file.   Review of Systems:  Patient states she is right-hand dominant Patient denies any previous cardiac disease Patient has a large left thoracotomy scar for surgery in 2000 and [place] for spontaneous pneumothorax Patient has history of weight loss over several years Patient denies dysphasia or active dental complaints Patient denies diabetes  ROS      Cardiac Review of Systems: Y or  [    ]= no  Chest Pain [ y from chest tube   ]  Resting SOB [   ] Exertional SOB  [ y ]  Orthopnea [  ]   Pedal Edema [   ]    Palpitations [  ] Syncope  [  ]   Presyncope [   ]  General Review of Systems: [Y] = yes [  ]=no Constitional: recent weight change [  ]; anorexia [  ]; fatigue [  ]; nausea [  ]; night sweats [  ]; fever [  ]; or chills [  ]  Dental: Last Dentist visit:   Eye : blurred vision [  ]; diplopia [   ]; vision changes [  ];  Amaurosis fugax[  ]; Resp: cough [  ];  wheezing[  ];  hemoptysis[  ]; shortness of breath[  ]; paroxysmal nocturnal  dyspnea[  ]; dyspnea on exertion[  ]; or orthopnea[  ];  GI:  gallstones[  ], vomiting[  ];  dysphagia[  ]; melena[  ];  hematochezia [  ]; heartburn[  ];   Hx of  Colonoscopy[  ]; GU: kidney stones [  ]; hematuria[  ];   dysuria [  ];  nocturia[  ];  history of     obstruction [  ]; urinary frequency [  ]             Skin: rash, swelling[  ];, hair loss[  ];  peripheral edema[  ];  or itching[  ]; Musculosketetal: myalgias[  ];  joint swelling[  ];  joint erythema[  ];  joint pain[  ];  back pain[  ];  Heme/Lymph: bruising[  ];  bleeding[  ];  anemia[  ];  Neuro: TIA[  ];  headaches[  ];  stroke[  ];  vertigo[  ];  seizures[  ];   paresthesias[  ];  difficulty walking[  ]; patient has dementia lives with daughter 69  Psych:depression[  ]; anxiety[  ];  Endocrine: diabetes[  ];  thyroid dysfunction[  ];                 Physical Exam: BP 128/69   Pulse 76   Temp 98.3 F (36.8 C) (Oral)   Resp (!) 25   SpO2 98%       Physical Exam  General: Elderly obese female no acute distress right chest tube in place HEENT: Normocephalic pupils equal , dentition adequate Neck: Supple without JVD, adenopathy, or bruit Chest: Clear to auscultation, symmetrical breath sounds, no rhonchi, no tenderness             or deformity.  No air leak with cough     Left posterior lateral thoracotomy scar Cardiovascular: Regular rate and rhythm, no murmur, no gallop, peripheral pulses             palpable in all extremities Abdomen:  Soft, nontender, no palpable mass or organomegaly.  Previous laparotomy scar Extremities: Warm, well-perfused, no clubbing cyanosis edema or tenderness,              no venous stasis changes of the legs Rectal/GU: Deferred Neuro: Grossly non--focal and symmetrical throughout.  Dementia with evidence of separation from reality Skin: Clean and dry without rash or ulceration  Diagnostic Studies & Laboratory data:     Recent Radiology Findings:   Dg Chest Portable 1  View  Result Date: 01/22/2018 CLINICAL DATA:  Right-sided chest tube placement. EXAM: PORTABLE CHEST 1 VIEW COMPARISON:  Chest radiograph performed earlier today at 4:41 a.m. FINDINGS: There has been interval placement of a right apical chest tube, with re-expansion of the right lung. A residual small right-sided pneumothorax is noted. Mild bilateral atelectasis is seen. No significant pleural effusion is identified. Associated prominent soft tissue air is noted along the right chest wall. The cardiomediastinal silhouette is borderline normal in size. No acute osseous abnormalities are identified. IMPRESSION: 1. Interval placement of right apical chest tube, with re-expansion of the right lung. Residual small right-sided pneumothorax noted. Mild bilateral atelectasis seen. 2. Prominent soft tissue air noted along  the right chest wall. Electronically Signed   By: Roanna RaiderJeffery  Chang M.D.   On: 01/22/2018 06:16   Dg Chest Portable 1 View  Result Date: 01/22/2018 CLINICAL DATA:  Acute onset of shortness of breath and productive cough. Generalized chest congestion. EXAM: PORTABLE CHEST 1 VIEW COMPARISON:  Chest radiograph performed 01/17/2018 FINDINGS: There is complete collapse of the right lung, aside from minimal tethering near the apex, with leftward mediastinal shift, compatible with tension pneumothorax. Mild left-sided atelectasis is noted. Postoperative change and opacity are again noted at the left upper lung zone. No pleural effusion is seen. The cardiomediastinal silhouette is normal in size. No acute osseous abnormalities are identified. IMPRESSION: Complete collapse of the right lung, with leftward mediastinal shift, compatible with tension pneumothorax. Critical Value/emergent results were called by telephone at the time of interpretation on 01/22/2018 at 4:55 am to Dr. Azalia BilisKEVIN CAMPOS, who verbally acknowledged these results. Electronically Signed   By: Roanna RaiderJeffery  Chang M.D.   On: 01/22/2018 04:57     I  have independently reviewed the above radiologic studies and discussed with the patient   Recent Lab Findings: Lab Results  Component Value Date   WBC 9.4 01/15/2018   HGB 14.9 01/15/2018   HCT 47.5 (H) 01/15/2018   PLT 261 01/15/2018   GLUCOSE 121 (H) 01/22/2018   ALT 15 01/15/2018   AST 26 01/15/2018   NA 142 01/22/2018   K 3.8 01/22/2018   CL 104 01/22/2018   CREATININE 1.11 (H) 01/22/2018   BUN 10 01/22/2018   CO2 28 01/22/2018   INR 1.07 01/15/2018      Assessment / Plan:      Second right spontaneous pneumothorax in the past 10 days. Right chest tube placed by Dr. Patria Maneampos with reexpansion of the lung.  Patient will need more definitive therapy.  Plan CT scan of chest and right VATS for blood resection pleurodesis Monday, August 13.  Plan discussed with patient and I will contact patient's daughter discussed plan of care.   @ME1 @ 01/22/2018 9:03 AM

## 2018-01-22 NOTE — ED Notes (Signed)
Dr. Patria Maneampos at bedside inserting chest tube at right chest .

## 2018-01-22 NOTE — ED Provider Notes (Addendum)
[facility] EMERGENCY DEPARTMENT Provider Note   CSN: [id number] Arrival date & time: 01/22/18  0353     History   Chief Complaint Chief Complaint  Patient presents with  . Shortness of Breath    HPI Julianne RiceDorothy A Bonito is a 62 y.o. female.  HPI Patient is a 62 year old female who was recently hospitalized with a spontaneous pneumothorax.  She was treated with chest tube thoracostomy last hospitalization but it unfortunately came out early in her course.  She was managed with oxygen and maintained to 10% pneumothorax that did not increase in size and that she was discharged.  She presents back with worsening shortness of breath and productive cough.  She states that she coughed and her breathing acutely worsened.  No productive cough.  No fevers.  Mild right-sided chest pain at this time  Past Medical History:  Diagnosis Date  . Dementia   . Hypertension   . Pneumothorax     Patient Active Problem List   Diagnosis Date Noted  . Pneumothorax 01/15/2018    Past Surgical History:  Procedure Laterality Date  . CHEST TUBE INSERTION       OB History   None      Home Medications    Prior to Admission medications   Medication Sig Start Date End Date Taking? Authorizing Provider  albuterol (PROVENTIL HFA;VENTOLIN HFA) 108 (90 Base) MCG/ACT inhaler Inhale 1-2 puffs into the lungs every 6 (six) hours as needed for wheezing or shortness of breath.   Yes [provider]    Family History No family history on file.  Social History Social History   Tobacco Use  . Smoking status: Never Smoker  . Smokeless tobacco: Never Used  Substance Use Topics  . Alcohol use: Not Currently  . Drug use: Never     Allergies   Aspirin   Review of Systems Review of Systems  All other systems reviewed and are negative.    Physical Exam Updated Vital Signs BP 109/78 (BP Location: Left Arm)   Pulse 92   Temp 98.3 F (36.8 C) (Oral)   Resp 18    SpO2 100%   Physical Exam  Constitutional: She is oriented to person, place, and time. She appears well-developed and well-nourished. No distress.  HENT:  Head: Normocephalic and atraumatic.  Eyes: EOM are normal.  Neck: Normal range of motion.  Cardiovascular: Normal rate, regular rhythm and normal heart sounds.  Pulmonary/Chest:  Decreased breath sounds on the right  Abdominal: Soft. She exhibits no distension. There is no tenderness.  Musculoskeletal: Normal range of motion.  Neurological: She is alert and oriented to person, place, and time.  Skin: Skin is warm and dry.  Psychiatric: She has a normal mood and affect. Judgment normal.  Nursing note and vitals reviewed.    ED Treatments / Results  Labs (all labs ordered are listed, but only abnormal results are displayed) Labs Reviewed  BASIC METABOLIC PANEL - Abnormal; Notable for the following components:      Result Value   Glucose, Bld 121 (*)    Creatinine, Ser 1.11 (*)    GFR calc non Af Amer 51 (*)    GFR calc Af Amer 59 (*)    All other components within normal limits  CBC    EKG None  Radiology Dg Chest Portable 1 View  Result Date: 01/22/2018 CLINICAL DATA:  Right-sided chest tube placement. EXAM: PORTABLE CHEST 1 VIEW COMPARISON:  Chest radiograph performed earlier today at  4:41 a.m. FINDINGS: There has been interval placement of a right apical chest tube, with re-expansion of the right lung. A residual small right-sided pneumothorax is noted. Mild bilateral atelectasis is seen. No significant pleural effusion is identified. Associated prominent soft tissue air is noted along the right chest wall. The cardiomediastinal silhouette is borderline normal in size. No acute osseous abnormalities are identified. IMPRESSION: 1. Interval placement of right apical chest tube, with re-expansion of the right lung. Residual small right-sided pneumothorax noted. Mild bilateral atelectasis seen. 2. Prominent soft tissue air  noted along the right chest wall. Electronically Signed   By: Roanna Raider M.D.   On: 01/22/2018 06:16   Dg Chest Portable 1 View  Result Date: 01/22/2018 CLINICAL DATA:  Acute onset of shortness of breath and productive cough. Generalized chest congestion. EXAM: PORTABLE CHEST 1 VIEW COMPARISON:  Chest radiograph performed 01/17/2018 FINDINGS: There is complete collapse of the right lung, aside from minimal tethering near the apex, with leftward mediastinal shift, compatible with tension pneumothorax. Mild left-sided atelectasis is noted. Postoperative change and opacity are again noted at the left upper lung zone. No pleural effusion is seen. The cardiomediastinal silhouette is normal in size. No acute osseous abnormalities are identified. IMPRESSION: Complete collapse of the right lung, with leftward mediastinal shift, compatible with tension pneumothorax. Critical Value/emergent results were called by telephone at the time of interpretation on 01/22/2018 at 4:55 am to Dr. Azalia Bilis, who verbally acknowledged these results. Electronically Signed   By: Roanna Raider M.D.   On: 01/22/2018 04:57    Procedures CHEST TUBE INSERTION Performed by: Azalia Bilis, MD Authorized by: Azalia Bilis, MD   Consent:    Consent obtained:  Verbal   Consent given by:  Patient   Risks discussed:  Bleeding, damage to surrounding structures, infection and pain Pre-procedure details:    Skin preparation:  ChloraPrep Sedation:    Sedation type:  Anxiolysis Anesthesia (see MAR for exact dosages):    Anesthesia method:  Local infiltration   Local anesthetic:  Lidocaine 2% WITH epi Procedure details:    Placement location:  R lateral   Scalpel size:  11   Tube size (Fr):  20   Dissection instrument:  Finger and Kelly clamp   Tube connected to:  Suction   Drainage characteristics:  Air only   Suture material:  0 silk   Dressing:  Petrolatum-impregnated gauze Post-procedure details:    Post-insertion  x-ray findings: tube in good position     Patient tolerance of procedure:  Tolerated with difficulty  .Critical Care Performed by: Azalia Bilis, MD Authorized by: Azalia Bilis, MD     CRITICAL CARE Performed by: Azalia Bilis Total critical care time: 31 minutes Critical care time was exclusive of separately billable procedures and treating other patients. Critical care was necessary to treat or prevent imminent or life-threatening deterioration. Critical care was time spent personally by me on the following activities: development of treatment plan with patient and/or surrogate as well as nursing, discussions with consultants, evaluation of patient's response to treatment, examination of patient, obtaining history from patient or surrogate, ordering and performing treatments and interventions, ordering and review of laboratory studies, ordering and review of radiographic studies, pulse oximetry and re-evaluation of patient's condition.   Medications Ordered in ED Medications  midazolam (VERSED) injection 1 mg ( Intravenous Given 01/22/18 0532)  morphine 4 MG/ML injection 4 mg (4 mg Intravenous Given 01/22/18 0533)  lidocaine (XYLOCAINE) 2 % (with pres) injection 400 mg (400  mg Infiltration Given 01/22/18 0533)  midazolam (VERSED) injection 1 mg (1 mg Intravenous Given 01/22/18 0553)  fentaNYL (SUBLIMAZE) injection 100 mcg (100 mcg Intravenous Given 01/22/18 0557)     Initial Impression / Assessment and Plan / ED Course  I have reviewed the triage vital signs and the nursing notes.  Pertinent labs & imaging results that were available during my care of the patient were reviewed by me and considered in my medical decision making (see chart for details).    Patient with recurrent right-sided pneumothorax.  Tachycardic at this time.  On a nonrebreather.  There is some likely tension.  20 French chest tube will be placed in the right lateral chest.  New insertion site will be required.   Patient tolerated the procedure although with some difficulty and pain given the size of her breast and soft tissue on the lateral aspect of her chest wall.  Discussed case with Dr. Maren Beach who will admit the patient to the hospital and continue to manage her spontaneous pneumothorax.  Patient and family updated.     Final Clinical Impressions(s) / ED Diagnoses   Final diagnoses:  Recurrent spontaneous pneumothorax    ED Discharge Orders    None       Azalia Bilis, MD 01/22/18 9147    Azalia Bilis, MD 01/22/18 [phone]

## 2018-01-22 NOTE — Progress Notes (Signed)
CT surgery p.m. Rounds  Patient resting comfortably with right chest tube, shortness of breath improved after chest tube placement I discussed the procedure of right VATS for stapling of blebs to treat recurrent spontaneous pneumothorax with the patient's daughter, Lavonna RuaSheri on the phone with discussion of the benefits of the procedure as well as the risks and the family understands and agrees.  Surgery scheduled for p.m. Monday, August 12

## 2018-01-22 NOTE — ED Triage Notes (Signed)
Patient reports SOB with productive cough and chest congestion this evening , SOB increases with exertion , denies fever or chills , right chest tube placement/admission 01/15/18 for right pneumothorax.

## 2018-01-23 ENCOUNTER — Inpatient Hospital Stay (HOSPITAL_COMMUNITY): Payer: Medicaid - Out of State

## 2018-01-23 LAB — POCT I-STAT 3, ART BLOOD GAS (G3+)
Acid-Base Excess: 2 mmol/L (ref 0.0–2.0)
Bicarbonate: 28 mmol/L (ref 20.0–28.0)
O2 Saturation: 95 %
TCO2: 29 mmol/L (ref 22–32)
pCO2 arterial: 46.8 mmHg (ref 32.0–48.0)
pH, Arterial: 7.385 (ref 7.350–7.450)
pO2, Arterial: 78 mmHg — ABNORMAL LOW (ref 83.0–108.0)

## 2018-01-23 LAB — COMPREHENSIVE METABOLIC PANEL
ALT: 14 U/L (ref 0–44)
ALT: 15 U/L (ref 0–44)
AST: 19 U/L (ref 15–41)
AST: 21 U/L (ref 15–41)
Albumin: 2.7 g/dL — ABNORMAL LOW (ref 3.5–5.0)
Albumin: 2.9 g/dL — ABNORMAL LOW (ref 3.5–5.0)
Alkaline Phosphatase: 75 U/L (ref 38–126)
Alkaline Phosphatase: 83 U/L (ref 38–126)
Anion gap: 8 (ref 5–15)
Anion gap: 4 — ABNORMAL LOW (ref 5–15)
BUN: 10 mg/dL (ref 8–23)
BUN: 6 mg/dL — ABNORMAL LOW (ref 8–23)
CO2: 26 mmol/L (ref 22–32)
CO2: 31 mmol/L (ref 22–32)
Calcium: 8.5 mg/dL — ABNORMAL LOW (ref 8.9–10.3)
Calcium: 8.8 mg/dL — ABNORMAL LOW (ref 8.9–10.3)
Chloride: 105 mmol/L (ref 98–111)
Chloride: 104 mmol/L (ref 98–111)
Creatinine, Ser: 0.83 mg/dL (ref 0.44–1.00)
Creatinine, Ser: 0.82 mg/dL (ref 0.44–1.00)
GFR calc Af Amer: >60 mL/min (ref >60)
GFR calc Af Amer: >60 mL/min (ref >60)
GFR calc non Af Amer: >60 mL/min (ref >60)
GFR calc non Af Amer: >60 mL/min (ref >60)
Glucose, Bld: 121 mg/dL — ABNORMAL HIGH (ref 70–99)
Glucose, Bld: 107 mg/dL — ABNORMAL HIGH (ref 70–99)
Potassium: 3.9 mmol/L (ref 3.5–5.1)
Potassium: 4 mmol/L (ref 3.5–5.1)
Sodium: 139 mmol/L (ref 135–145)
Sodium: 139 mmol/L (ref 135–145)
Total Bilirubin: 0.5 mg/dL (ref 0.3–1.2)
Total Bilirubin: 0.4 mg/dL (ref 0.3–1.2)
Total Protein: 5.5 g/dL — ABNORMAL LOW (ref 6.5–8.1)
Total Protein: 6.5 g/dL (ref 6.5–8.1)

## 2018-01-23 LAB — URINALYSIS, ROUTINE W REFLEX MICROSCOPIC
Bilirubin Urine: NEGATIVE
Glucose, UA: 50 mg/dL — AB
Hgb urine dipstick: NEGATIVE
Ketones, ur: NEGATIVE mg/dL
Leukocytes, UA: NEGATIVE
Nitrite: NEGATIVE
Protein, ur: NEGATIVE mg/dL
Specific Gravity, Urine: 1.006 (ref 1.005–1.030)
pH: 6 (ref 5.0–8.0)

## 2018-01-23 LAB — CBC
HCT: 37.3 % (ref 36.0–46.0)
HCT: 41.6 % (ref 36.0–46.0)
Hemoglobin: 11.6 g/dL — ABNORMAL LOW (ref 12.0–15.0)
Hemoglobin: 13.2 g/dL (ref 12.0–15.0)
MCH: 29.7 pg (ref 26.0–34.0)
MCH: 30.3 pg (ref 26.0–34.0)
MCHC: 31.1 g/dL (ref 30.0–36.0)
MCHC: 31.7 g/dL (ref 30.0–36.0)
MCV: 95.6 fL (ref 78.0–100.0)
MCV: 95.4 fL (ref 78.0–100.0)
Platelets: 215 10*3/uL (ref 150–400)
Platelets: 242 10*3/uL (ref 150–400)
RBC: 3.9 MIL/uL (ref 3.87–5.11)
RBC: 4.36 MIL/uL (ref 3.87–5.11)
RDW: 12.6 % (ref 11.5–15.5)
RDW: 12.4 % (ref 11.5–15.5)
WBC: 6.9 10*3/uL (ref 4.0–10.5)
WBC: 7.4 10*3/uL (ref 4.0–10.5)

## 2018-01-23 LAB — APTT: aPTT: 33 seconds (ref 24–36)

## 2018-01-23 LAB — PROTIME-INR
INR: 1.22
INR: 1.12
Prothrombin Time: 15.3 seconds — ABNORMAL HIGH (ref 11.4–15.2)
Prothrombin Time: 14.3 seconds (ref 11.4–15.2)

## 2018-01-23 LAB — ABO/RH: ABO/RH(D): A POS

## 2018-01-23 LAB — PREPARE RBC (CROSSMATCH)

## 2018-01-23 MED ORDER — CEFAZOLIN SODIUM-DEXTROSE 2-4 GM/100ML-% IV SOLN
2.0000 g | INTRAVENOUS | Status: AC
  Filled 2018-01-23: qty 100

## 2018-01-23 MED ORDER — CEFAZOLIN SODIUM-DEXTROSE 2-4 GM/100ML-% IV SOLN
2.0000 g | INTRAVENOUS | Status: DC
  Filled 2018-01-23: qty 100

## 2018-01-23 MED ORDER — ONDANSETRON HCL 4 MG/2ML IJ SOLN
4.0000 mg | Freq: Four times a day (QID) | INTRAMUSCULAR | Status: DC | PRN
  Administered 2018-01-24 – 2018-01-28 (×5): 4 mg via INTRAVENOUS
  Filled 2018-01-23 (×5): qty 2

## 2018-01-23 MED ORDER — SODIUM CHLORIDE 0.45 % IV SOLN
INTRAVENOUS | Status: DC
  Administered 2018-01-24: 07:00:00 via INTRAVENOUS

## 2018-01-23 MED ORDER — KCL IN DEXTROSE-NACL 10-5-0.45 MEQ/L-%-% IV SOLN
INTRAVENOUS | Status: DC
  Filled 2018-01-23: qty 1000

## 2018-01-23 NOTE — Progress Notes (Signed)
ABG ordered on RA. Pt on 2L , taken off O2 and RT will draw ABG in 30 mins. RN made aware.

## 2018-01-23 NOTE — Plan of Care (Signed)
  Problem: Clinical Measurements: Goal: Respiratory complications will improve Outcome: Progressing   Problem: Clinical Measurements: Goal: Cardiovascular complication will be avoided Outcome: Progressing   Problem: Nutrition: Goal: Adequate nutrition will be maintained Outcome: Progressing   Problem: Safety: Goal: Ability to remain free from injury will improve Outcome: Progressing   Problem: Skin Integrity: Goal: Risk for impaired skin integrity will decrease Outcome: Progressing   

## 2018-01-23 NOTE — Progress Notes (Signed)
Carolyn Huynh, but has a diagnosis of Dementia. Carolyn refusing surgery. Daughter and other family members visited to sign consent for Carolyn. Dr.Van Maudie Flakesrigt spoke with her daughter who she lives with and another daughter on the phone that lives in [place] and explained that they wouldn't force her to have the surgery if she didn't agree to have it. Family tried to convince Carolyn to have surgery and she became very angry and agitated and pushed the call bell and asked that her family be removed from the room and band from returning. Dr.Van Trigt aware.

## 2018-01-23 NOTE — Plan of Care (Signed)
  Problem: Clinical Measurements: Goal: Respiratory complications will improve Outcome: Progressing Goal: Cardiovascular complication will be avoided Outcome: Progressing   Problem: Nutrition: Goal: Adequate nutrition will be maintained Outcome: Progressing   Problem: Elimination: Goal: Will not experience complications related to urinary retention Outcome: Progressing   Problem: Pain Managment: Goal: General experience of comfort will improve Outcome: Progressing   Problem: Safety: Goal: Ability to remain free from injury will improve Outcome: Progressing   Problem: Skin Integrity: Goal: Risk for impaired skin integrity will decrease Outcome: Progressing   

## 2018-01-23 NOTE — Progress Notes (Signed)
CT Surgery  Medically stable but now refusing VATS Will let daughter speak with patient on planned visit this pm May treat spont pntx with chest tube only Keep npo for now

## 2018-01-23 NOTE — Progress Notes (Signed)
Procedure(s) (LRB): VIDEO ASSISTED THORACOSCOPY BLEB RESECTION AND PLEURODESIS (Right) Subjective: cxr without pntx No air leak Pt having some pain at tube site  Objective: Vital signs in last 24 hours: Temp:  [97.1 F (36.2 C)-97.9 F (36.6 C)] 97.1 F (36.2 C) (08/11 0700) Pulse Rate:  [52-77] 76 (08/11 0900) Cardiac Rhythm: Normal sinus rhythm (08/11 0845) Resp:  [15-52] 22 (08/11 0900) BP: (100-155)/(55-113) 152/79 (08/11 0900) SpO2:  [92 %-100 %] 99 % (08/11 0900) Weight:  [74.2 kg] 74.2 kg (08/11 0500)  Hemodynamic parameters for last 24 hours:    Intake/Output from previous day: 08/10 0701 - 08/11 0700 In: 2816.2 [P.O.:840; I.V.:994.6; IV Piggyback:981.6] Out: 1985 [Urine:1900; Chest Tube:85] Intake/Output this shift: Total I/O In: 456.1 [P.O.:360; I.V.:96.1] Out: 40 [Chest Tube:40]       Exam    General- alert and comfortable,     Neck- no JVD, no cervical adenopathy palpable, no carotid bruit   Lungs- clear without rales, wheezes   Cor- regular rate and rhythm, no murmur , gallop   Abdomen- soft, non-tender   Extremities - warm, non-tender, minimal edema   Neuro- oriented, appropriate, no focal weakness- dementia   Lab Results: Recent Labs    01/22/18 1005 01/23/18 0324  WBC 16.4* 6.9  HGB 12.6 11.6*  HCT 39.7 37.3  PLT 269 215   BMET:  Recent Labs    01/22/18 0431 01/22/18 1005 01/23/18 0324  NA 142  --  139  K 3.8  --  3.9  CL 104  --  105  CO2 28  --  26  GLUCOSE 121*  --  121*  BUN 10  --  10  CREATININE 1.11* 0.95 0.83  CALCIUM 9.2  --  8.5*    PT/INR:  Recent Labs    01/23/18 0324  LABPROT 15.3*  INR 1.22   ABG No results found for: PHART, HCO3, TCO2, ACIDBASEDEF, O2SAT CBG (last 3)  No results for input(s): GLUCAP in the last 72 hours.  Assessment/Plan: S/P Procedure(s) (LRB): VIDEO ASSISTED THORACOSCOPY BLEB RESECTION AND PLEURODESIS (Right) L VATS in am I have discussed benefits/risks with patient and  daughter   LOS: 1 day    Kathlee Nationseter Van Trigt III 01/23/2018

## 2018-01-24 ENCOUNTER — Inpatient Hospital Stay (HOSPITAL_COMMUNITY): Payer: Medicaid - Out of State

## 2018-01-24 ENCOUNTER — Encounter (HOSPITAL_COMMUNITY)
Admission: EM | Disposition: A | Discharge status: Home / Self Care | Payer: Self-pay | Source: Home / Self Care | Attending: Cardiothoracic Surgery

## 2018-01-24 LAB — BASIC METABOLIC PANEL
Anion gap: 6 (ref 5–15)
BUN: 6 mg/dL — ABNORMAL LOW (ref 8–23)
CO2: 30 mmol/L (ref 22–32)
Calcium: 8.8 mg/dL — ABNORMAL LOW (ref 8.9–10.3)
Chloride: 103 mmol/L (ref 98–111)
Creatinine, Ser: 0.85 mg/dL (ref 0.44–1.00)
GFR calc Af Amer: >60 mL/min (ref >60)
GFR calc non Af Amer: >60 mL/min (ref >60)
Glucose, Bld: 110 mg/dL — ABNORMAL HIGH (ref 70–99)
Potassium: 4.2 mmol/L (ref 3.5–5.1)
Sodium: 139 mmol/L (ref 135–145)

## 2018-01-24 SURGERY — VIDEO ASSISTED THORACOSCOPY
Anesthesia: General | Site: Chest | Laterality: Right

## 2018-01-24 NOTE — Progress Notes (Signed)
When RN was speaking with patient today regarding surgery, she stated "I don't know why ya'll keep asking about it, I'm not having surgery." RN will express concerns to day shift RN.

## 2018-01-24 NOTE — Progress Notes (Signed)
Procedure(s) (LRB): VIDEO ASSISTED THORACOSCOPY BLEB RESECTION AND PLEURODESIS (Right) Subjective: Refuses VATS even though she understands that spont L PNTX will keep recurring Place chest tube to water seal  Objective: Vital signs in last 24 hours: Temp:  [97.7 F (36.5 C)-98.5 F (36.9 C)] 97.8 F (36.6 C) (08/12 0718) Pulse Rate:  [57-82] 59 (08/12 0700) Cardiac Rhythm: Sinus bradycardia (08/12 0400) Resp:  [15-38] 15 (08/12 0700) BP: (102-168)/(52-85) 119/60 (08/12 0700) SpO2:  [83 %-100 %] 100 % (08/12 0700) Weight:  [75.3 kg] 75.3 kg (08/12 0500)  Hemodynamic parameters for last 24 hours:  nsr  Intake/Output from previous day: 08/11 0701 - 08/12 0700 In: 1295.1 [P.O.:1080; I.V.:215.1] Out: 1900 [Urine:1760; Chest Tube:140] Intake/Output this shift: No intake/output data recorded.  No air leak CXR with expansion of L lung, tube in place  Lab Results: Recent Labs    01/23/18 0324 01/23/18 1147  WBC 6.9 7.4  HGB 11.6* 13.2  HCT 37.3 41.6  PLT 215 242   BMET:  Recent Labs    01/23/18 1147 01/24/18 0249  NA 139 139  K 4.0 4.2  CL 104 103  CO2 31 30  GLUCOSE 107* 110*  BUN 6* 6*  CREATININE 0.82 0.85  CALCIUM 8.8* 8.8*    PT/INR:  Recent Labs    01/23/18 1147  LABPROT 14.3  INR 1.12   ABG    Component Value Date/Time   PHART 7.385 01/23/2018 1129   HCO3 28.0 01/23/2018 1129   TCO2 29 01/23/2018 1129   O2SAT 95.0 01/23/2018 1129   CBG (last 3)  No results for input(s): GLUCAP in the last 72 hours.  Assessment/Plan: S/P Procedure(s) (LRB): VIDEO ASSISTED THORACOSCOPY BLEB RESECTION AND PLEURODESIS (Right) Chest tube therapy for treatment of recurrent spont pntx - patient declines recommended VATS therapy   LOS: 2 days    Kathlee Nationseter Van Trigt III 01/24/2018

## 2018-01-24 NOTE — Plan of Care (Signed)
Patient refusing VATS procedure. Education given by MD and Rn. Pleural chest tube remains at water seal. Stepdown orders in place. Problem: Clinical Measurements: Goal: Ability to maintain clinical measurements within normal limits will improve Outcome: Progressing Goal: Will remain free from infection Outcome: Progressing   Problem: Education: Goal: Knowledge of General Education information will improve Description Including pain rating scale, medication(s)/side effects and non-pharmacologic comfort measures Outcome: Not Progressing   Problem: Health Behavior/Discharge Planning: Goal: Ability to manage health-related needs will improve Outcome: Not Progressing

## 2018-01-25 ENCOUNTER — Inpatient Hospital Stay (HOSPITAL_COMMUNITY): Payer: Medicaid - Out of State

## 2018-01-25 MED ORDER — SORBITOL 70 % SOLN
60.0000 mL | Freq: Every day | Status: AC | PRN
  Administered 2018-01-28: 60 mL via ORAL
  Filled 2018-01-25: qty 60

## 2018-01-25 NOTE — Plan of Care (Signed)
  Problem: Clinical Measurements: Goal: Will remain free from infection 01/25/2018 1147 by Almetta LovelyPittman, Zerline Melchior, RN Outcome: Progressing 01/25/2018 1051 by Almetta LovelyPittman, Maleena Eddleman, RN Outcome: Progressing Goal: Respiratory complications will improve 01/25/2018 1147 by Almetta LovelyPittman, Mivaan Corbitt, RN Outcome: Progressing 01/25/2018 1051 by Almetta LovelyPittman, Damarco Keysor, RN Outcome: Progressing Goal: Cardiovascular complication will be avoided 01/25/2018 1147 by Almetta LovelyPittman, Jennett Tarbell, RN Outcome: Progressing 01/25/2018 1051 by Almetta LovelyPittman, Amri Lien, RN Outcome: Progressing   Problem: Activity: Goal: Risk for activity intolerance will decrease Outcome: Progressing   Problem: Nutrition: Goal: Adequate nutrition will be maintained Outcome: Progressing

## 2018-01-25 NOTE — Progress Notes (Signed)
Procedure(s) (LRB): VIDEO ASSISTED THORACOSCOPY BLEB RESECTION AND PLEURODESIS (Right) Subjective: Mild pain fromchest tube CXR ok Patient will not agree to VATS as recommended Transfer to stepdown  Objective: Vital signs in last 24 hours: Temp:  [97.5 F (36.4 C)-98.2 F (36.8 C)] 98.2 F (36.8 C) (08/13 1145) Pulse Rate:  [51-76] 62 (08/13 1400) Cardiac Rhythm: Sinus bradycardia (08/13 1200) Resp:  [14-26] 22 (08/13 1400) BP: (100-171)/(50-77) 148/72 (08/13 1400) SpO2:  [96 %-100 %] 100 % (08/13 1400) Weight:  [76.2 kg] 76.2 kg (08/13 0600)  Hemodynamic parameters for last 24 hours:  stable  Intake/Output from previous day: 08/12 0701 - 08/13 0700 In: 1323.6 [P.O.:780; I.V.:543.6] Out: 1690 [Urine:1650; Chest Tube:40] Intake/Output this shift: Total I/O In: 660 [P.O.:660] Out: 2000 [Urine:2000]       Exam    General- alert and comfortable    Neck- no JVD, no cervical adenopathy palpable, no carotid bruit   Lungs- clear without rales, wheezes   Cor- regular rate and rhythm, no murmur , gallop   Abdomen- soft, non-tender   Extremities - warm, non-tender, minimal edema   Neuro- oriented, appropriate, no focal weakness    Lab Results: Recent Labs    01/23/18 0324 01/23/18 1147  WBC 6.9 7.4  HGB 11.6* 13.2  HCT 37.3 41.6  PLT 215 242   BMET:  Recent Labs    01/23/18 1147 01/24/18 0249  NA 139 139  K 4.0 4.2  CL 104 103  CO2 31 30  GLUCOSE 107* 110*  BUN 6* 6*  CREATININE 0.82 0.85  CALCIUM 8.8* 8.8*    PT/INR:  Recent Labs    01/23/18 1147  LABPROT 14.3  INR 1.12   ABG    Component Value Date/Time   PHART 7.385 01/23/2018 1129   HCO3 28.0 01/23/2018 1129   TCO2 29 01/23/2018 1129   O2SAT 95.0 01/23/2018 1129   CBG (last 3)  No results for input(s): GLUCAP in the last 72 hours.  Assessment/Plan: S/P Procedure(s) (LRB): VIDEO ASSISTED THORACOSCOPY BLEB RESECTION AND PLEURODESIS (Right) CXR in am Leave tube to water seal Transfer  to 2 C  LOS: 3 days    Kathlee Nationseter Van Trigt III 01/25/2018

## 2018-01-25 NOTE — Plan of Care (Signed)
  Problem: Clinical Measurements: Goal: Will remain free from infection Outcome: Progressing Goal: Respiratory complications will improve Outcome: Progressing Goal: Cardiovascular complication will be avoided Outcome: Progressing   Problem: Activity: Goal: Risk for activity intolerance will decrease Outcome: Progressing   Problem: Nutrition: Goal: Adequate nutrition will be maintained Outcome: Progressing   

## 2018-01-26 ENCOUNTER — Inpatient Hospital Stay (HOSPITAL_COMMUNITY): Payer: Medicaid - Out of State

## 2018-01-26 LAB — TYPE AND SCREEN
ABO/RH(D): A POS
Antibody Screen: NEGATIVE
Unit division: 0
Unit division: 0

## 2018-01-26 LAB — BPAM RBC
Blood Product Expiration Date: 201908112359
Blood Product Expiration Date: 201908202359
ISSUE DATE / TIME: 201908111727
Unit Type and Rh: 600
Unit Type and Rh: 6200

## 2018-01-26 NOTE — Progress Notes (Signed)
Procedure(s) (LRB): VIDEO ASSISTED THORACOSCOPY BLEB RESECTION AND PLEURODESIS (Right) Subjective: recuurent R spontaneous PNTX Chest tube in good position, lung up No air leak with strong cough Patient still refused VATS Plan move to 4 E, prob remove chest tube tomorrow after clamping trial  Objective: Vital signs in last 24 hours: Temp:  [97.7 F (36.5 C)-98.2 F (36.8 C)] 97.7 F (36.5 C) (08/14 0739) Pulse Rate:  [51-78] 64 (08/14 0800) Cardiac Rhythm: Normal sinus rhythm (08/14 0800) Resp:  [13-28] 17 (08/14 0800) BP: (117-171)/(57-92) 159/80 (08/14 0800) SpO2:  [93 %-100 %] 98 % (08/14 0800) Weight:  [74.5 kg] 74.5 kg (08/14 0500)  Hemodynamic parameters for last 24 hours:    Intake/Output from previous day: 08/13 0701 - 08/14 0700 In: 840 [P.O.:840] Out: 2660 [Urine:2650; Chest Tube:10] Intake/Output this shift: No intake/output data recorded.       Exam    General- alert and comfortable    Neck- no JVD, no cervical adenopathy palpable, no carotid bruit   Lungs- clear without rales, wheezes   Cor- regular rate and rhythm, no murmur , gallop   Abdomen- soft, non-tender   Extremities - warm, non-tender, minimal edema   Neuro- oriented, appropriate, no focal weakness   Lab Results: Recent Labs    01/23/18 1147  WBC 7.4  HGB 13.2  HCT 41.6  PLT 242   BMET:  Recent Labs    01/23/18 1147 01/24/18 0249  NA 139 139  K 4.0 4.2  CL 104 103  CO2 31 30  GLUCOSE 107* 110*  BUN 6* 6*  CREATININE 0.82 0.85  CALCIUM 8.8* 8.8*    PT/INR:  Recent Labs    01/23/18 1147  LABPROT 14.3  INR 1.12   ABG    Component Value Date/Time   PHART 7.385 01/23/2018 1129   HCO3 28.0 01/23/2018 1129   TCO2 29 01/23/2018 1129   O2SAT 95.0 01/23/2018 1129   CBG (last 3)  No results for input(s): GLUCAP in the last 72 hours.  Assessment/Plan: S/P Procedure(s) (LRB): VIDEO ASSISTED THORACOSCOPY BLEB RESECTION AND PLEURODESIS (Right) Mobilize leave chest tube  to water seal another day   LOS: 4 days    Kathlee Nationseter Van Trigt III 01/26/2018

## 2018-01-26 NOTE — Progress Notes (Signed)
Patient received from 2H in stable condition and oriented to room and unit. Telemetry applied and CCMD notified. Chest tube to water seal and no leak observed.

## 2018-01-27 ENCOUNTER — Inpatient Hospital Stay (HOSPITAL_COMMUNITY): Payer: Medicaid - Out of State

## 2018-01-27 DIAGNOSIS — J9311 Primary spontaneous pneumothorax: Secondary | ICD-10-CM

## 2018-01-27 NOTE — Discharge Summary (Signed)
Physician Discharge Summary       [address] [place].[address]       [name] [address]             [phone]    Patient ID: Carolyn Huynh MRN: [id number] DOB/AGE: September 12, 1955 62 y.o.  Admit date: 01/22/2018 Discharge date: 01/28/2018  Admission Diagnoses: Recurrent, right spontaneous pneumothorax  Discharge Diagnoses:  1. History of hypertension 2. History of dementia   Procedure (s): 20 French right chest tube placed by Dr. Patria Mane on 01/22/2018.  History of Presenting Illness: This is a 62 year old obese AA female non-smoker who presented with recurrent right spontaneous pneumothorax after being hospitalized with the same problem last week.  Her x-ray earlier this a.m. showed a 90% right pneumothorax and a 20 French chest tube was placed by Dr. Patria Mane with reexpansion of the lung.  The patient will be admitted for chest tube management and subsequent VATS procedure.  The patient is a 62 year old woman with no prior smoking history who presented to the emergency room overnight after developing shortness of breath following a coughing episode. A chest x-ray showed a large right pneumothorax with some component of tension and shift of the mediastinum to the left. A chest tube was inserted by the emergency room physician. A follow-up chest x-ray showed near complete resolution of the pneumothorax but unfortunately due to the laxity of the patient's chest wall tissue when she sat up the chest tube was essentially out of the pleural space. Since the pneumothorax was nearly completely resolved, the tube was removed. She has had a couple follow-up chest x-rays since then which have shown no reaccumulation of the pneumothorax and it is about 10% on the most recent chest x-ray. She also developed some substernal chest discomfort in the emergency room and had an electrocardiogram that was unremarkable and negative troponin level x2. Her chest discomfort resolved with pain medicine. She  reportedly had some family present with her in the emergency room but family was present when Dr. Donata Clay was evaluating the patient. The patient reports that she had a chest tube in the past and also had surgery on her left chest in 2000. She does not remember any of the details of this and does have some history of dementia. The plan was to obtain a CT scan of chest. She likely would need right  VATS for bleb resection and pleurodesis. This was going to be done on  Monday, August 13.  This plan was discussed with patient and the patient's daughter would also be contacted to discuss this  plan of care.  Brief Hospital Course:  She remained afebrile and hemodynamically stable. Chest x ray on 01/23/2018 showed a stable 5% right pneumothorax, mild right base atelectasis, chronic interstitial lung disease, and mild cardiomegaly. Chest tube was on water seal. Patient refused to have surgery. She remained in the ICU and was stable for transfer on 01/26/2018. Daily chest x rays were obtained. She continued to have a minimal right apical pneumothorax.  Her chest tube was accidentally pulled out the morning of 08/15. Chest xray this afternoon shows tiny right apical pneumothorax slightly decreased in size since prior exam . She is on 2 liters of oxygen, will order home oxygen. Follow up chest x ray on 08/16 showed stable right apical pneumothorax .    Latest Vital Signs: Blood pressure 122/61, pulse (!) 58, temperature 97.8 F (36.6 C), temperature source Oral, resp. rate 18, height 5\' 4"  (1.626 m), weight 71 kg,  SpO2 100 %.  Physical Exam:  General appearance: alert, cooperative and no distress Heart: regular rate and rhythm, S1, S2 normal, no murmur, click, rub or gallop Lungs: clear to auscultation bilaterally Abdomen: soft, non-tender; bowel sounds normal; no masses,  no organomegaly Extremities: extremities normal, atraumatic, no cyanosis or edema Wound: clean and dry  Discharge Condition:  good  Recent laboratory studies:  Lab Results  Component Value Date   WBC 7.4 01/23/2018   HGB 13.2 01/23/2018   HCT 41.6 01/23/2018   MCV 95.4 01/23/2018   PLT 242 01/23/2018   Lab Results  Component Value Date   NA 139 01/24/2018   K 4.2 01/24/2018   CL 103 01/24/2018   CO2 30 01/24/2018   CREATININE 0.85 01/24/2018   GLUCOSE 110 (H) 01/24/2018      Diagnostic Studies: Dg Chest 2 View  Result Date: 01/27/2018 CLINICAL DATA:  Follow-up right lung surgery EXAM: CHEST - 2 VIEW COMPARISON:  01/27/2018 FINDINGS: Heart is upper limits normal in size. Tiny right apical pneumothorax noted, slightly decreased in size since prior study. Minimal bibasilar atelectasis or scarring and small right effusion. No acute bony abnormality. IMPRESSION: Tiny right apical pneumothorax. Slightly decreased since prior study. Minimal bibasilar atelectasis or scarring.  Small right effusion. Electronically Signed   By: Charlett NoseKevin  Dover M.D.   On: 01/27/2018 13:26   Dg Chest 2 View  Result Date: 01/24/2018 CLINICAL DATA:  Chest pain EXAM: CHEST - 2 VIEW COMPARISON:  January 23, 2018 FINDINGS: Chest tube on the right is unchanged in position. There is a rather minimal right apical pneumothorax, stable. No tension component. There is subcutaneous air on the right, also present 1 day prior. There is postoperative change in the left upper lobe with areas of scarring. There is mild bibasilar atelectasis. There is no edema or consolidation. Heart size and pulmonary vascularity are normal. No adenopathy. No bone lesions. IMPRESSION: Stable chest tube position on the right with rather minimal right apical pneumothorax. No tension component. Subcutaneous air noted on the right. Postoperative change noted on the left. Areas of scarring and mild atelectatic change are stable. No new opacity. Stable cardiac silhouette. Electronically Signed   By: Bretta BangWilliam  Woodruff III M.D.   On: 01/24/2018 07:41   Ct Chest W Contrast  Result  Date: 01/22/2018 CLINICAL DATA:  Recurrent spontaneous right pneumothorax. Pre pleurodesis evaluation. A history of treated non-small cell lung cancer with locoregional recurrence was also given. EXAM: CT CHEST WITH CONTRAST TECHNIQUE: Multidetector CT imaging of the chest was performed during intravenous contrast administration. CONTRAST:  75mL OMNIPAQUE IOHEXOL 300 MG/ML  SOLN COMPARISON:  Chest radiographs dating back to 01/15/2018. FINDINGS: Cardiovascular: Mildly enlarged heart. Minimal atheromatous aortic calcification. Mediastinum/Nodes: Small bilateral thyroid nodules. The largest is in the left lobe, measuring 8 mm. No enlarged lymph nodes. Lungs/Pleura: Right chest tube and right lower lobe atelectasis. Approximately 5% residual right pneumothorax. Right lateral subcutaneous emphysema. Small amount of patchy and calcific density in the medial and anterior aspects of the left upper lobe. The calcific density is curvilinear, suggesting surgical staples. No lung nodules seen. Bilateral bullous changes. No pleural fluid. Upper Abdomen: Dilated intrahepatic ducts. Musculoskeletal: Thoracic and lower cervical spine degenerative changes. IMPRESSION: 1. Approximately 5% residual right pneumothorax. 2. Right chest tube and right lower lobe atelectasis. 3. Small amount of patchy and calcific density in the medial and anterior aspects of the left upper lobe. This may represent postsurgical changes and scarring. 4. Sub-centimeter thyroid nodule(s) noted, too small  to characterize, but most likely benign in the absence of known clinical risk factors for thyroid carcinoma. 5. Centrilobular emphysema. 6. Mild calcific aortic atherosclerosis 7. Dilated intrahepatic ducts. This could be normal if the patient has had a previous cholecystectomy. If not, this would be suspicious for biliary obstruction. Aortic Atherosclerosis (ICD10-I70.0) and Emphysema (ICD10-J43.9). Electronically Signed   By: Beckie Salts M.D.   On:  01/22/2018 09:45   Dg Chest Port 1 View  Result Date: 01/27/2018 CLINICAL DATA:  Recurrent spontaneous pneumothorax. Chest tube fell out this a.m. EXAM: PORTABLE CHEST 1 VIEW COMPARISON:  01/26/2018. FINDINGS: Previously identified right chest tube is not visualized. Tiny right sided pneumothorax again noted. No prominent pneumothorax. Postsurgical changes left lung. Bilateral pleural-parenchymal thickening again noted consistent scarring. Heart size stable. Degenerative change thoracic spine. IMPRESSION: Previously identified right chest tube is not visualized. This is consistent with the patient's history of the tube falling out this a.m. Tiny right-sided pneumothorax is again noted. No prominent pneumothorax noted. Electronically Signed   By: Maisie Fus  Register   On: 01/27/2018 09:25   Dg Chest Port 1 View  Result Date: 01/26/2018 CLINICAL DATA:  Chest tube placement. EXAM: PORTABLE CHEST 1 VIEW COMPARISON:  Radiograph of January 25, 2018. FINDINGS: Stable cardiomediastinal silhouette. Right-sided chest tube is unchanged in position. Stable minimal right apical pneumothorax is noted. Stable mild right basilar subsegmental atelectasis is noted with minimal right pleural effusion. Stable postsurgical changes are noted in the left upper lobe. Stable left basilar atelectasis or scarring is noted with minimal pleural effusion. Bony thorax is unremarkable. IMPRESSION: Stable position of right-sided chest tube with minimal right apical pneumothorax. Stable bibasilar subsegmental atelectasis or scarring is noted with minimal pleural effusions. Electronically Signed   By: Lupita Raider, M.D.   On: 01/26/2018 09:50   Dg Chest Port 1 View  Result Date: 01/25/2018 CLINICAL DATA:  Chest tube EXAM: PORTABLE CHEST 1 VIEW COMPARISON:  01/24/2018 FINDINGS: Right chest tube remains in place with small residual right apical pneumothorax, stable. Postoperative changes on the left. Mild cardiomegaly, bibasilar atelectasis.  IMPRESSION: No significant change since prior study. Stable residual tiny right apical pneumothorax. Electronically Signed   By: Charlett Nose M.D.   On: 01/25/2018 07:29   Portable Chest 1 View  Result Date: 01/23/2018 CLINICAL DATA:  Chest tube. EXAM: PORTABLE CHEST 1 VIEW COMPARISON:  Yesterday. FINDINGS: Stable right chest tube and approximately 5% right pneumothorax compared to the CT yesterday. This is much smaller than on the previous radiograph. Interval enlargement of the cardiac silhouette. Stable prominence of the interstitial markings. Interval small amount of linear density at the right lung base. Diffuse osteopenia. Thoracic spine degenerative changes. Decreased right lateral subcutaneous emphysema. IMPRESSION: 1. Stable approximately 5% right pneumothorax with a right chest tube in place. 2. Interval mild right basilar atelectasis. 3. Interval mild cardiomegaly. 4. Stable chronic interstitial lung disease. Electronically Signed   By: Beckie Salts M.D.   On: 01/23/2018 07:48   Dg Chest Portable 1 View  Result Date: 01/22/2018 CLINICAL DATA:  Right-sided chest tube placement. EXAM: PORTABLE CHEST 1 VIEW COMPARISON:  Chest radiograph performed earlier today at 4:41 a.m. FINDINGS: There has been interval placement of a right apical chest tube, with re-expansion of the right lung. A residual small right-sided pneumothorax is noted. Mild bilateral atelectasis is seen. No significant pleural effusion is identified. Associated prominent soft tissue air is noted along the right chest wall. The cardiomediastinal silhouette is borderline normal in size. No acute  osseous abnormalities are identified. IMPRESSION: 1. Interval placement of right apical chest tube, with re-expansion of the right lung. Residual small right-sided pneumothorax noted. Mild bilateral atelectasis seen. 2. Prominent soft tissue air noted along the right chest wall. Electronically Signed   By: Roanna Raider M.D.   On: 01/22/2018  06:16   Dg Chest Portable 1 View  Result Date: 01/22/2018 CLINICAL DATA:  Acute onset of shortness of breath and productive cough. Generalized chest congestion. EXAM: PORTABLE CHEST 1 VIEW COMPARISON:  Chest radiograph performed 01/17/2018 FINDINGS: There is complete collapse of the right lung, aside from minimal tethering near the apex, with leftward mediastinal shift, compatible with tension pneumothorax. Mild left-sided atelectasis is noted. Postoperative change and opacity are again noted at the left upper lung zone. No pleural effusion is seen. The cardiomediastinal silhouette is normal in size. No acute osseous abnormalities are identified. IMPRESSION: Complete collapse of the right lung, with leftward mediastinal shift, compatible with tension pneumothorax. Critical Value/emergent results were called by telephone at the time of interpretation on 01/22/2018 at 4:55 am to Dr. Azalia Bilis, who verbally acknowledged these results. Electronically Signed   By: Roanna Raider M.D.   On: 01/22/2018 04:57   Dg Chest Port 1 View  Result Date: 01/17/2018 CLINICAL DATA:  Recent pneumothorax EXAM: PORTABLE CHEST 1 VIEW COMPARISON:  January 16, 2018 FINDINGS: Small right apical pneumothorax is unchanged. No tension component. There is postoperative change on the left with areas of patchy opacity in the left upper lobe region, concerning for potential pneumonia. Lungs elsewhere clear. Heart is upper normal in size with pulmonary vascularity normal. No adenopathy. There is degenerative change in the thoracic spine. IMPRESSION: Small right apical pneumothorax persists without tension component. Patchy opacity left upper lobe is stable. There is postoperative change in this area. Question pneumonia versus scarring in this area. Both entities may exist concurrently. No new opacity evident.  Stable cardiac silhouette. Electronically Signed   By: Bretta Bang III M.D.   On: 01/17/2018 07:41   Portable Chest 1  View  Result Date: 01/16/2018 CLINICAL DATA:  Pneumothorax. EXAM: PORTABLE CHEST 1 VIEW COMPARISON:  01/15/2018 FINDINGS: The cardiomediastinal silhouette is unchanged. Postsurgical changes are again noted in the left upper lung with similar appearance of adjacent lung opacity, presumably scarring. A small right apical pneumothorax has decreased in size. Coarsened appearance of the interstitial markings bilaterally is similar to the prior study and likely reflects an element of chronic lung disease. No sizable pleural effusion is evident. IMPRESSION: Decreased size of small right apical pneumothorax. Electronically Signed   By: Sebastian Ache M.D.   On: 01/16/2018 08:04   Dg Chest Port 1 View  Result Date: 01/15/2018 CLINICAL DATA:  Pneumothorax EXAM: PORTABLE CHEST 1 VIEW COMPARISON:  Chest x-rays from earlier same day. FINDINGS: The small RIGHT apical pneumothorax appears stable compared to the most recent chest x-ray obtained at 8:25 a.m., perhaps slightly smaller compared to the earlier chest x-ray labeled 5:49 a.m. Heart size and mediastinal contours are stable. No new lung findings. Postsurgical changes again noted on the LEFT. IMPRESSION: 1. Small pneumothorax at the RIGHT lung apex, not significantly changed compared to the most recent chest x-ray obtained at 8:25 a.m., likely smaller compared to the earlier chest x-ray obtained at 5:49 a.m. 2. No new findings. Electronically Signed   By: Bary Richard M.D.   On: 01/15/2018 12:11   Dg Chest Portable 1 View  Result Date: 01/15/2018 CLINICAL DATA:  Follow-up pneumothorax EXAM: PORTABLE  CHEST 1 VIEW COMPARISON:  01/15/2018 FINDINGS: Cardiac shadow is stable. There are changes consistent with small right-sided pneumothorax. The overall appearance is stable. Postsurgical changes on the left are noted with associated scarring. No bony abnormality is noted. IMPRESSION: Stable small right pneumothorax Electronically Signed   By: Alcide Clever M.D.   On:  01/15/2018 08:39   Dg Chest Port 1 View  Result Date: 01/15/2018 CLINICAL DATA:  Chest tube came out. EXAM: PORTABLE CHEST 1 VIEW COMPARISON:  Radiographs earlier this day. FINDINGS: Right-sided chest tube is no longer present. Small right apical and lateral pneumothorax, slightly increased in size from prior exam, with small inferior component not visualized. No recurrent mediastinal shift. Again seen streaky right basilar opacities. Unchanged heart size and mediastinal contours. Postsurgical change in the left lung with patchy opacities in the perihilar region. IMPRESSION: 1. Slight increased size of right pneumothorax after chest tube came out, remains small in size. Recommend continued radiographic follow-up. 2. Improving right basilar atelectasis. 3. Patchy opacities throughout the left lung, unchanged from most recent comparison. Electronically Signed   By: Rubye Oaks M.D.   On: 01/15/2018 06:23   Dg Chest Port 1 View  Result Date: 01/15/2018 CLINICAL DATA:  Pneumothorax, chest tube placement. EXAM: PORTABLE CHEST 1 VIEW COMPARISON:  Radiograph earlier this day. FINDINGS: Right-sided chest tube in place with significant decreased size of right pneumothorax. The side port of the chest tube is likely in the chest wall. Small residual pneumothorax apical and laterally. Improved and near completely resolved mediastinal shift. Streaky right basilar opacities. Surgical sutures in the left perihilar region. Patchy opacity throughout the left lung, with some improvement. IMPRESSION: 1. Significant decreased size right pneumothorax post chest tube placement with small residual apical and lateral component. Near completely resolved mediastinal shift. The side port of the chest tube is likely within the soft tissues, recommend repositioning. 2. Streaky right lower lobe opacity is likely atelectasis. 3. Patchy opacities throughout the left lung with improvement, likely improving atelectasis. Electronically  Signed   By: Rubye Oaks M.D.   On: 01/15/2018 05:11   Dg Chest Portable 1 View  Result Date: 01/15/2018 CLINICAL DATA:  Shortness of breath. EXAM: PORTABLE CHEST 1 VIEW COMPARISON:  None. FINDINGS: Large right sided pneumothorax with right lung collapse down medially. There does appear to be some tethering a in the right upper lung. Cardiomediastinal anatomy is shifted to the left. Left hemidiaphragm is elevated. There are fairly extensive staple lines in the left upper lung suggesting prior wedge resection or lobectomy. Patchy opacity in the left lung may be atelectatic although infection possible. The visualized bony structures of the thorax are intact. Telemetry leads overlie the chest. IMPRESSION: Large right pneumothorax with shift of cardiomediastinal anatomy to the left, raising concern for tension pneumothorax. Postsurgical changes in the left lung with diffuse patchy left lung opacity potentially atelectasis. Critical Value/emergent results were called by me at the time of interpretation on 01/15/2018 at 2:54 am to Dr. Zadie Rhine , who verbally acknowledged these results. Electronically Signed   By: Kennith Center M.D.   On: 01/15/2018 02:55         Discharge Medications: Allergies as of 01/28/2018      Reactions   Aspirin Shortness Of Breath      Medication List    TAKE these medications   acetaminophen 325 MG tablet Commonly known as:  TYLENOL Take 2 tablets (650 mg total) by mouth every 6 (six) hours as needed for mild pain (  or Fever >/= 101).   albuterol 108 (90 Base) MCG/ACT inhaler Commonly known as:  PROVENTIL HFA;VENTOLIN HFA Inhale 1-2 puffs into the lungs every 6 (six) hours as needed for wheezing or shortness of breath.   HYDROcodone-acetaminophen 5-325 MG tablet Commonly known as:  NORCO/VICODIN Take 1-2 tablets by mouth every 4 (four) hours as needed for moderate pain.            Durable Medical Equipment  (From admission, onward)         Start      Ordered   01/28/18 0753  For home use only DME oxygen  Once    Question Answer Comment  Mode or (Route) Nasal cannula   Liters per Minute 2   Frequency Continuous (stationary and portable oxygen unit needed)   Oxygen conserving device Yes   Oxygen delivery system Gas      01/28/18 0752          Follow Up Appointments: Follow-up Information    Donata ClayVan Trigt, Theron AristaPeter, MD. Go on 02/07/2018.   Specialty:  Cardiothoracic Surgery Why:  PA[facility]LAT CXR [facility] which is in the same building as Dr. Zenaida NieceVan Trigt's office) on 02/07/2018 at 3:30 pm. Contact information: [address] [place] [address] [phone]           Signed: Bernadette Hoitessa N ContePA-C 01/28/2018, 7:52 AM

## 2018-01-27 NOTE — Progress Notes (Signed)
Pt refused morning ambulation endorsing she would walk after breakfast. Pt educated on necessity of ambulation but still declined.

## 2018-01-27 NOTE — Progress Notes (Signed)
@  [phone] NT called this RN to room 2/2 pt's Chest tube "on floor". Pt assessed and tube found to be completely out. NT was rounding on pt who was in bed. Pt unaware of tube removal and in no acute distress. Pt unable to recall how tube came out. Site immediately covered with vaseline gauze and 4x4s to provide occlusive dressing. Dr. Donata ClayVan Trigt notified and he endorsed to notify PA Tessa [phone]. PA paged and returned page @[phone]  confirming pt would be seen this AM. Morning Chest X-ray presently being taken and all VSS with SpO2 @100 % on 2L [place]. Will continue to monitor.

## 2018-01-27 NOTE — Progress Notes (Addendum)
      [address]       [place] [id number]             [phone]        Procedure(s) (LRB): VIDEO ASSISTED THORACOSCOPY BLEB RESECTION AND PLEURODESIS (Right) Subjective: Chest tube accidentally pulled out per nurse this am CXR doesn't look significantly changed  Objective: Vital signs in last 24 hours: Temp:  [97.6 F (36.4 C)-98.3 F (36.8 C)] 97.8 F (36.6 C) (08/15 0412) Pulse Rate:  [55-81] 75 (08/15 0650) Cardiac Rhythm: Normal sinus rhythm (08/15 0700) Resp:  [15-24] 18 (08/15 0650) BP: (118-167)/(61-88) 139/62 (08/15 0650) SpO2:  [90 %-100 %] 100 % (08/15 0650) Weight:  [71.4 kg] 71.4 kg (08/15 0215)  Hemodynamic parameters for last 24 hours:    Intake/Output from previous day: 08/14 0701 - 08/15 0700 In: 360 [P.O.:360] Out: 920 [Urine:100; Chest Tube:820] Intake/Output this shift: No intake/output data recorded.  General appearance: alert, cooperative and no distress Heart: regular rate and rhythm Lungs: clear to auscultation bilaterally Abdomen: benign Extremities: no calf tenderness Wound: dressing CDI  Lab Results: No results for input(s): WBC, HGB, HCT, PLT in the last 72 hours. BMET: No results for input(s): NA, K, CL, CO2, GLUCOSE, BUN, CREATININE, CALCIUM in the last 72 hours.  PT/INR: No results for input(s): LABPROT, INR in the last 72 hours. ABG    Component Value Date/Time   PHART 7.385 01/23/2018 1129   HCO3 28.0 01/23/2018 1129   TCO2 29 01/23/2018 1129   O2SAT 95.0 01/23/2018 1129   CBG (last 3)  No results for input(s): GLUCAP in the last 72 hours.  Meds Scheduled Meds: . docusate sodium  100 mg Oral BID  . multivitamin with minerals  1 tablet Oral Daily  . sodium chloride flush  3 mL Intravenous Q12H   Continuous Infusions: . dextrose 5 % and 0.45 % NaCl with KCl 10 mEq/L Stopped (01/23/18 1400)   PRN Meds:.acetaminophen **OR** acetaminophen, alum & mag hydroxide-simeth, fentaNYL (SUBLIMAZE) injection,  HYDROcodone-acetaminophen, levalbuterol, ondansetron (ZOFRAN) IV, polyethylene glycol, sorbitol  Xrays Dg Chest Port 1 View  Result Date: 01/26/2018 CLINICAL DATA:  Chest tube placement. EXAM: PORTABLE CHEST 1 VIEW COMPARISON:  Radiograph of January 25, 2018. FINDINGS: Stable cardiomediastinal silhouette. Right-sided chest tube is unchanged in position. Stable minimal right apical pneumothorax is noted. Stable mild right basilar subsegmental atelectasis is noted with minimal right pleural effusion. Stable postsurgical changes are noted in the left upper lobe. Stable left basilar atelectasis or scarring is noted with minimal pleural effusion. Bony thorax is unremarkable. IMPRESSION: Stable position of right-sided chest tube with minimal right apical pneumothorax. Stable bibasilar subsegmental atelectasis or scarring is noted with minimal pleural effusions. Electronically Signed   By: Lupita RaiderJames  Green Jr, M.D.   On: 01/26/2018 09:50    Assessment/Plan: S/P Procedure(s) (LRB): VIDEO ASSISTED THORACOSCOPY BLEB RESECTION AND PLEURODESIS (Right)  1 doing well , CXR is stable- she has some SOB- will repeat CXR around noon. She says she has home O2- poss d/c later today or am     LOS: 5 days    Rowe ClackWayne E Gold 01/27/2018 Observe overnight after chest tube was dislodged Follow-up chest x-ray today shows only tiny right apical pneumothorax. Check x-ray in a.m. if still stable patient will be ready for discharge patient examined and medical record reviewed,agree with above note. Kathlee Nationseter Van Trigt III 01/27/2018

## 2018-01-27 NOTE — Discharge Instructions (Signed)

## 2018-01-28 ENCOUNTER — Inpatient Hospital Stay (HOSPITAL_COMMUNITY): Payer: Medicaid - Out of State

## 2018-01-28 MED ORDER — HYDROCODONE-ACETAMINOPHEN 5-325 MG PO TABS: 1.0000 | ORAL_TABLET | ORAL | 0 refills | Status: DC | PRN

## 2018-01-28 MED ORDER — ACETAMINOPHEN 325 MG PO TABS: 650.0000 mg | ORAL_TABLET | Freq: Four times a day (QID) | ORAL | Status: DC | PRN

## 2018-01-28 MED ORDER — NITROGLYCERIN 0.4 MG SL SUBL: 0.4000 mg | SUBLINGUAL_TABLET | SUBLINGUAL | Status: DC | PRN

## 2018-01-28 NOTE — Progress Notes (Signed)
After trying to print AVS for pt was not able to due to "d/c reconcilliation not complete. Dr. Donata ClayVan Trigt notified. Was asked to pg Gershon CraneWayne Gold PA who was pged. Was notified Gershon CraneWayne Gold who was scrubbed in surgery would fix the problem as soon as he could. Will continue to monitor. Francee Piccoloerek RCharity fundraiser

## 2018-01-28 NOTE — Progress Notes (Addendum)
      [address]       [place] [id number]             [phone]        Procedure(s) (LRB): VIDEO ASSISTED THORACOSCOPY BLEB RESECTION AND PLEURODESIS (Right) Subjective: Feels okay this morning.   Objective: Vital signs in last 24 hours: Temp:  [97.6 F (36.4 C)-98.7 F (37.1 C)] 97.8 F (36.6 C) (08/16 0406) Pulse Rate:  [56-68] 58 (08/16 0406) Cardiac Rhythm: Normal sinus rhythm (08/16 0004) Resp:  [18-19] 18 (08/16 0406) BP: (122-135)/(53-63) 122/61 (08/16 0406) SpO2:  [97 %-100 %] 100 % (08/16 0406) Weight:  [71 kg] 71 kg (08/16 0406)     Intake/Output from previous day: 08/15 0701 - 08/16 0700 In: 480 [P.O.:480] Out: -  Intake/Output this shift: No intake/output data recorded.  General appearance: alert, cooperative and no distress Heart: regular rate and rhythm, S1, S2 normal, no murmur, click, rub or gallop Lungs: clear to auscultation bilaterally Abdomen: soft, non-tender; bowel sounds normal; no masses,  no organomegaly Extremities: extremities normal, atraumatic, no cyanosis or edema Wound: clean and dry  Lab Results: No results for input(s): WBC, HGB, HCT, PLT in the last 72 hours. BMET: No results for input(s): NA, K, CL, CO2, GLUCOSE, BUN, CREATININE, CALCIUM in the last 72 hours.  PT/INR: No results for input(s): LABPROT, INR in the last 72 hours. ABG    Component Value Date/Time   PHART 7.385 01/23/2018 1129   HCO3 28.0 01/23/2018 1129   TCO2 29 01/23/2018 1129   O2SAT 95.0 01/23/2018 1129   CBG (last 3)  No results for input(s): GLUCAP in the last 72 hours.  Assessment/Plan: S/P Procedure(s) (LRB): VIDEO ASSISTED THORACOSCOPY BLEB RESECTION AND PLEURODESIS (Right)  1. Chest tube was dislodged yesterday. Small right apical pneumothorax remains unchanged this morning. She remains on 2L [place] with excellent oxygenation. Will order home oxygen. 2. Remains hemodynamically stable nad in NSR in the 60s. BP well controlled.  3.  No recent labs.  4. Remove all chest tube sutures today before discharge  Plan: Home today if okay with Dr. Donata ClayVan Trigt.    LOS: 6 days    Sharlene Doryessa N Conte 01/28/2018  Patient examined and chest x-ray reviewed No pneumothorax on morning x-ray Patient ready for discharge She understands there is a definite risk of recurrent spontaneous right pneumothorax since she did not agree to recommended VATS with bleb resection and pleurodesis She is recommended to return to the ED this weekend if she has return symptoms of spontaneous pneumothorax Discharge instructions reviewed with patient including review of symptoms of recurrent spontaneous pneumothorax Lovett SoxPeter Cassandria Drew MD

## 2018-01-28 NOTE — Progress Notes (Signed)
Pt called out c/o pain to mid chest 8/10. EKG done shows NSR. Pt also c/o gas pain. Pt states " I have passed gas with some relief. Pt v/s: HR: 69, BP: 134/64. R: 18, O2: 100 on 2L placed. Carolyn Huynh notified.

## 2018-01-28 NOTE — Progress Notes (Addendum)
Spoke with Gershon CraneWayne Gold, PA, patient will be discharge 8/17.

## 2018-01-28 NOTE — Care Management Note (Signed)
Case Management Note Donn PieriniKristi Zareen Jamison RN, BSN Unit 4E- RN Care Coordinator  [phone]  Patient Details  Name: Julianne RiceDorothy A Treadway MRN: [id number] Date of Birth: 02/15/1956  Subjective/Objective:   Pt admitted with spont. pntx s/p chest tube placement                 Action/Plan: PTA pt lived at home- per pt she has home 02 with [facility]. 2L baseline. No further CM needs noted for transition home.   Expected Discharge Date:  01/28/18               Expected Discharge Plan:  Home/Self Care  In-House Referral:  NA  Discharge planning Services  CM Consult  Post Acute Care Choice:  Durable Medical Equipment Choice offered to:     DME Arranged:  Oxygen DME Agency:  [facility]  HH [facility]    [facility]:     Status of Service:  Completed, signed off  If discussed at Long Length of Stay Meetings, dates discussed:    Discharge Disposition: home/self care   Additional Comments:  Darrold SpanWebster, Undra Harriman Hall, RN 01/28/2018, 11:03 AM

## 2018-01-29 LAB — CREATININE, SERUM
Creatinine, Ser: 0.8 mg/dL (ref 0.44–1.00)
GFR calc Af Amer: >60 mL/min (ref >60)
GFR calc non Af Amer: >60 mL/min (ref >60)

## 2018-01-29 NOTE — Plan of Care (Signed)
Patient is adequate for discharge.  

## 2018-01-29 NOTE — Plan of Care (Signed)
Care plans reviewed and patient is progressing.  

## 2018-01-29 NOTE — Progress Notes (Signed)
Carolyn Huynh given discharge instructions.  Discussed new prescriptions and medication changes.  Discussed follow up appointments and activities.  Discussed signs and symptoms to watch for and when to follow up with her physician.  Verbalized understanding.

## 2018-01-29 NOTE — Progress Notes (Signed)
Discharge instructions given to Carolyn Huynh.  Discussed prescriptions, medication changes and side effects.  Discussed follow up appointments and activities.  Discussed signs and symptoms to watch for and when to contact her physician.  Verbalized understanding.

## 2018-01-29 NOTE — Progress Notes (Signed)
      [address]       [place], [id number]             [phone]        Procedure(s) (LRB): VIDEO ASSISTED THORACOSCOPY BLEB RESECTION AND PLEURODESIS (Right) Subjective: Minor nausea, otherwise feels ok  Objective: Vital signs in last 24 hours: Temp:  [98 F (36.7 C)-98.1 F (36.7 C)] 98.1 F (36.7 C) (08/16 2152) Pulse Rate:  [78] 78 (08/16 2152) Cardiac Rhythm: Normal sinus rhythm (08/17 0700) Resp:  [13-22] 22 (08/16 2152) BP: (139-147)/(63-70) 139/63 (08/16 2152) SpO2:  [98 %] 98 % (08/16 2152) Weight:  [71.7 kg] 71.7 kg (08/17 0423)  Hemodynamic parameters for last 24 hours:    Intake/Output from previous day: No intake/output data recorded. Intake/Output this shift: No intake/output data recorded.  General appearance: alert, cooperative and no distress Heart: regular rate and rhythm Lungs: clear to auscultation bilaterally Abdomen: benign Extremities: no edema Wound: incis healing well  Lab Results: No results for input(s): WBC, HGB, HCT, PLT in the last 72 hours. BMET:  Recent Labs    01/29/18 0630  CREATININE 0.80    PT/INR: No results for input(s): LABPROT, INR in the last 72 hours. ABG    Component Value Date/Time   PHART 7.385 01/23/2018 1129   HCO3 28.0 01/23/2018 1129   TCO2 29 01/23/2018 1129   O2SAT 95.0 01/23/2018 1129   CBG (last 3)  No results for input(s): GLUCAP in the last 72 hours.  Meds Scheduled Meds: . docusate sodium  100 mg Oral BID  . multivitamin with minerals  1 tablet Oral Daily  . sodium chloride flush  3 mL Intravenous Q12H   Continuous Infusions: . dextrose 5 % and 0.45 % NaCl with KCl 10 mEq/L Stopped (01/23/18 1400)   PRN Meds:.acetaminophen **OR** acetaminophen, alum & mag hydroxide-simeth, fentaNYL (SUBLIMAZE) injection, HYDROcodone-acetaminophen, levalbuterol, nitroGLYCERIN, ondansetron (ZOFRAN) IV, polyethylene glycol  Xrays Dg Chest 2 View  Result Date: 01/28/2018 CLINICAL DATA:   Pneumothorax on right EXAM: CHEST - 2 VIEW COMPARISON:  01/27/2018 FINDINGS: Small right apical and right lateral pneumothorax unchanged. Small right effusion unchanged. Mild right lower lobe atelectasis. Left upper lobe clips in surrounding density unchanged from prior studies. Negative for heart failure IMPRESSION: Small right pneumothorax and small right effusion unchanged. Mild right lower lobe atelectasis also unchanged. Electronically Signed   By: Marlan Palauharles  Clark M.D.   On: 01/28/2018 09:36   Dg Chest 2 View  Result Date: 01/27/2018 CLINICAL DATA:  Follow-up right lung surgery EXAM: CHEST - 2 VIEW COMPARISON:  01/27/2018 FINDINGS: Heart is upper limits normal in size. Tiny right apical pneumothorax noted, slightly decreased in size since prior study. Minimal bibasilar atelectasis or scarring and small right effusion. No acute bony abnormality. IMPRESSION: Tiny right apical pneumothorax. Slightly decreased since prior study. Minimal bibasilar atelectasis or scarring.  Small right effusion. Electronically Signed   By: Charlett NoseKevin  Dover M.D.   On: 01/27/2018 13:26    Assessment/Plan: S/P Procedure(s) (LRB): VIDEO ASSISTED THORACOSCOPY BLEB RESECTION AND PLEURODESIS (Right) Plan for discharge: see discharge orders hemodyn stable in sinus, good sats on RA   LOS: 7 days    Rowe ClackWayne E Gold 01/29/2018 Pager [phone]

## 2018-01-29 NOTE — Plan of Care (Signed)
°  Problem: Coping: °Goal: Level of anxiety will decrease °Outcome: Progressing °  °

## 2018-01-31 ENCOUNTER — Inpatient Hospital Stay (HOSPITAL_COMMUNITY): Payer: Medicaid - Out of State

## 2018-01-31 ENCOUNTER — Inpatient Hospital Stay (HOSPITAL_COMMUNITY)
Admission: EM | Admit: 2018-01-31 | Discharge: 2018-02-06 | DRG: 163 | Disposition: A | Discharge status: Home / Self Care | Payer: Medicaid - Out of State | Attending: Cardiothoracic Surgery | Admitting: Cardiothoracic Surgery

## 2018-01-31 ENCOUNTER — Other Ambulatory Visit: Payer: Self-pay

## 2018-01-31 ENCOUNTER — Emergency Department (HOSPITAL_COMMUNITY): Payer: Medicaid - Out of State

## 2018-01-31 ENCOUNTER — Encounter (HOSPITAL_COMMUNITY): Payer: Self-pay | Admitting: *Deleted

## 2018-01-31 DIAGNOSIS — I10 Essential (primary) hypertension: Secondary | ICD-10-CM | POA: Diagnosis present

## 2018-01-31 DIAGNOSIS — Z9981 Dependence on supplemental oxygen: Secondary | ICD-10-CM

## 2018-01-31 DIAGNOSIS — Z4682 Encounter for fitting and adjustment of non-vascular catheter: Secondary | ICD-10-CM

## 2018-01-31 DIAGNOSIS — R51 Headache: Secondary | ICD-10-CM | POA: Diagnosis not present

## 2018-01-31 DIAGNOSIS — J9311 Primary spontaneous pneumothorax: Secondary | ICD-10-CM | POA: Diagnosis not present

## 2018-01-31 DIAGNOSIS — Z419 Encounter for procedure for purposes other than remedying health state, unspecified: Secondary | ICD-10-CM

## 2018-01-31 DIAGNOSIS — J9601 Acute respiratory failure with hypoxia: Secondary | ICD-10-CM | POA: Diagnosis not present

## 2018-01-31 DIAGNOSIS — J449 Chronic obstructive pulmonary disease, unspecified: Secondary | ICD-10-CM | POA: Diagnosis not present

## 2018-01-31 DIAGNOSIS — F0390 Unspecified dementia without behavioral disturbance: Secondary | ICD-10-CM | POA: Diagnosis present

## 2018-01-31 DIAGNOSIS — J9383 Other pneumothorax: Secondary | ICD-10-CM | POA: Diagnosis not present

## 2018-01-31 DIAGNOSIS — J9382 Other air leak: Secondary | ICD-10-CM | POA: Diagnosis not present

## 2018-01-31 DIAGNOSIS — R0602 Shortness of breath: Secondary | ICD-10-CM | POA: Diagnosis present

## 2018-01-31 DIAGNOSIS — J939 Pneumothorax, unspecified: Secondary | ICD-10-CM | POA: Diagnosis present

## 2018-01-31 DIAGNOSIS — Z9889 Other specified postprocedural states: Secondary | ICD-10-CM

## 2018-01-31 LAB — BASIC METABOLIC PANEL
ANION GAP: 9 (ref 5–15)
BUN: 12 mg/dL (ref 8–23)
CALCIUM: 9.2 mg/dL (ref 8.9–10.3)
CO2: 29 mmol/L (ref 22–32)
Chloride: 104 mmol/L (ref 98–111)
Creatinine, Ser: 0.8 mg/dL (ref 0.44–1.00)
GFR calc Af Amer: >60 mL/min (ref >60)
GFR calc non Af Amer: >60 mL/min (ref >60)
GLUCOSE: 166 mg/dL — AB (ref 70–99)
Potassium: 3.2 mmol/L — ABNORMAL LOW (ref 3.5–5.1)
Sodium: 142 mmol/L (ref 135–145)

## 2018-01-31 LAB — CBC
HCT: 39.6 % (ref 36.0–46.0)
HEMOGLOBIN: 13.2 g/dL (ref 12.0–15.0)
MCH: 31.4 pg (ref 26.0–34.0)
MCHC: 33.3 g/dL (ref 30.0–36.0)
MCV: 94.1 fL (ref 78.0–100.0)
Platelets: 294 10*3/uL (ref 150–400)
RBC: 4.21 MIL/uL (ref 3.87–5.11)
RDW: 13.3 % (ref 11.5–15.5)
WBC: 8.1 10*3/uL (ref 4.0–10.5)

## 2018-01-31 LAB — I-STAT TROPONIN, ED: TROPONIN I, POC: 0 ng/mL (ref 0.00–0.08)

## 2018-01-31 MED ORDER — MORPHINE SULFATE (PF) 4 MG/ML IV SOLN
4.0000 mg | Freq: Once | INTRAVENOUS | Status: AC
  Administered 2018-01-31: 4 mg via INTRAVENOUS
  Filled 2018-01-31: qty 1

## 2018-01-31 MED ORDER — MORPHINE SULFATE (PF) 2 MG/ML IV SOLN
2.0000 mg | INTRAVENOUS | Status: DC | PRN
  Administered 2018-01-31 – 2018-02-01 (×2): 2 mg via INTRAVENOUS
  Filled 2018-01-31 (×2): qty 1

## 2018-01-31 MED ORDER — LEVALBUTEROL HCL 0.63 MG/3ML IN NEBU
0.6300 mg | INHALATION_SOLUTION | Freq: Three times a day (TID) | RESPIRATORY_TRACT | Status: DC | PRN
  Administered 2018-02-01: 0.63 mg via RESPIRATORY_TRACT
  Filled 2018-01-31: qty 3

## 2018-01-31 MED ORDER — HYDRALAZINE HCL 20 MG/ML IJ SOLN
10.0000 mg | Freq: Four times a day (QID) | INTRAMUSCULAR | Status: DC | PRN
  Administered 2018-02-01: 10 mg via INTRAVENOUS
  Filled 2018-01-31: qty 1

## 2018-01-31 MED ORDER — ALUM & MAG HYDROXIDE-SIMETH 200-200-20 MG/5ML PO SUSP: 30.0000 mL | ORAL | Status: DC | PRN

## 2018-01-31 MED ORDER — CEFAZOLIN SODIUM-DEXTROSE 2-4 GM/100ML-% IV SOLN
2.0000 g | INTRAVENOUS | Status: AC
  Administered 2018-02-01: 2 g via INTRAVENOUS
  Filled 2018-01-31 (×2): qty 100

## 2018-01-31 MED ORDER — ONDANSETRON HCL 4 MG/2ML IJ SOLN
4.0000 mg | Freq: Once | INTRAMUSCULAR | Status: AC
  Administered 2018-01-31: 4 mg via INTRAVENOUS
  Filled 2018-01-31: qty 2

## 2018-01-31 MED ORDER — ACETAMINOPHEN 325 MG PO TABS
650.0000 mg | ORAL_TABLET | Freq: Four times a day (QID) | ORAL | Status: DC | PRN
  Administered 2018-01-31 – 2018-02-01 (×2): 650 mg via ORAL
  Filled 2018-01-31 (×2): qty 2

## 2018-01-31 MED ORDER — OXYCODONE HCL 5 MG PO TABS
5.0000 mg | ORAL_TABLET | ORAL | Status: DC | PRN
  Administered 2018-02-01: 5 mg via ORAL
  Filled 2018-01-31: qty 1

## 2018-01-31 MED ORDER — LIDOCAINE HCL (PF) 1 % IJ SOLN
0.0000 mL | Freq: Once | INTRAMUSCULAR | Status: AC | PRN
  Administered 2018-01-31: 30 mL via INTRADERMAL
  Filled 2018-01-31: qty 30

## 2018-01-31 MED ORDER — KETAMINE HCL 50 MG/5ML IJ SOSY
1.0000 mg/kg | PREFILLED_SYRINGE | Freq: Once | INTRAMUSCULAR | Status: AC
  Administered 2018-01-31: 71 mg via INTRAVENOUS
  Filled 2018-01-31: qty 10

## 2018-01-31 MED ORDER — KETAMINE HCL 10 MG/ML IJ SOLN
INTRAMUSCULAR | Status: AC | PRN
  Administered 2018-01-31: 30 mg via INTRAVENOUS

## 2018-01-31 MED ORDER — ONDANSETRON HCL 4 MG PO TABS: 4.0000 mg | ORAL_TABLET | Freq: Three times a day (TID) | ORAL | Status: DC | PRN

## 2018-01-31 MED ORDER — DOCUSATE SODIUM 100 MG PO CAPS: 100.0000 mg | ORAL_CAPSULE | Freq: Two times a day (BID) | ORAL | Status: DC | PRN

## 2018-01-31 NOTE — H&P (Addendum)
[address]       [place] [address]             [phone]        [facility] Medical Record #[id number] Date of Birth: 1955-10-09  Referring: [facility] ED ( Dr. Rhunette Croft) Primary Care: Patient, [name] Primary Cardiologist:No primary care provider on file.  Chief Complaint:    Chief Complaint  Patient presents with  . Shortness of Breath   History of Present Illness:      Carolyn Huynh is a 62 yo AA female well known to [facility].  She has a history of pneumothorax with surgery to left chest back in 2000, however patient does not remember much of this hospitalization.  She has been admitted several times over the past 2 weeks for spontaneous pneumothorax.  Initially on 01/15/2018, with chest tube placement and resolution of pneumothorax. She was again admitted on 01/22/18 for recurrent pneumothorax which again required chest tube placement.  At that time it was felt patient should undergo Right VATS with bleb resection and pleurodesis. However, she refused to have the procedure.  Her chest tube fell out during that hospitalization with CXR showing a < 5% residual pneumothorax.  She was discharged home on 01/27/2018.  She again presented to the ED early this morning with complaints of shortness of breath with decreased saturations.  She was treated with nebulizers enroute to ED.  CXR was obtained and showed a large right sided pneumothorax.  She underwent a right sided chest tube placement and [facility] was consulted.  Dr. Donata Clay stated the patient would require a VATs procedure and suggested the patient be transferred to [facility] for further care.  Currently the patient complains of a headache.  She also has some pain at her chest tube site.    She originally stated she would proceed with surgery.  However on evaluation of the patient she states she wishes to speak with her family prior to making a final decision.  She was again counseled that she would likely  continue to have recurrent pneumthoraces without surgical intervention.  She states she is aware of the risk.  Current Activity/ Functional Status: Patient is independent with mobility/ambulation, transfers, ADL's, IADL's.   Past Medical History:  Diagnosis Date  . Dementia   . Hypertension   . Pneumothorax     Past Surgical History:  Procedure Laterality Date  . CHEST TUBE INSERTION      Social History   Tobacco Use  Smoking Status Never Smoker  Smokeless Tobacco Never Used   Social History   Substance and Sexual Activity  Alcohol Use Not Currently     Allergies  Allergen Reactions  . Aspirin Shortness Of Breath    No current facility-administered medications for this encounter.    Current Outpatient Medications  Medication Sig Dispense Refill  . acetaminophen (TYLENOL) 325 MG tablet Take 2 tablets (650 mg total) by mouth every 6 (six) hours as needed for mild pain (or Fever >/= 101).    Marland Kitchen albuterol (PROVENTIL HFA;VENTOLIN HFA) 108 (90 Base) MCG/ACT inhaler Inhale 1-2 puffs into the lungs every 6 (six) hours as needed for wheezing or shortness of breath.    Marland Kitchen HYDROcodone-acetaminophen (NORCO/VICODIN) 5-325 MG tablet Take 1-2 tablets by mouth every 4 (four) hours as needed for moderate pain. 30 tablet 0    (Not in a hospital admission)  History reviewed. No pertinent family history.  Review of Systems:   Review of Systems  Musculoskeletal: Positive for myalgias.   Constitutional: negative Ears, nose, mouth, throat, and face: negative Respiratory: positive for asthma, pleurisy/chest pain and shortness of breath Cardiovascular: positive for chest pain and at chest tube site Gastrointestinal: negative Musculoskeletal:negative Neurological: positive for headaches     Cardiac Review of Systems: Y or  [    ]= no  Chest Pain [ Y, at chest tube site   ]  Resting SOB [Y ] Exertional SOB  [  ]  Orthopnea [  ]   Pedal Edema [   ]    Palpitations [  ] Syncope  [  ]    Presyncope [   ]  General Review of Systems: [Y] = yes [  ]=no Constitional: recent weight change [ N ]; anorexia [  ]; fatigue [  ]; nausea Klaus.Mock[N  ]; night sweats [  ]; fever [ N ]; or chills [  ]                                                               Dental: Last Dentist visit:   Eye : blurred vision [  ]; diplopia [   ]; vision changes [  ];  Amaurosis fugax[  ]; Resp: cough [  ];  wheezing[  ];  hemoptysis[  ]; shortness of breath[ Y ]; paroxysmal nocturnal dyspnea[  ]; dyspnea on exertion[  ]; or orthopnea[  ];  GI:  gallstones[  ], vomiting[  ];  dysphagia[  ]; melena[  ];  hematochezia [  ]; heartburn[  ];   Hx of  Colonoscopy[  ]; GU: kidney stones [  ]; hematuria[  ];   dysuria [  ];  nocturia[  ];  history of     obstruction [  ]; urinary frequency [  ]             Skin: rash, swelling[  ];, hair loss[  ];  peripheral edema[  ];  or itching[  ]; Musculosketetal: myalgias[  ];  joint swelling[  ];  joint erythema[  ];  joint pain[  ];  back pain[  ];  Heme/Lymph: bruising[  ];  bleeding[  ];  anemia[  ];  Neuro: TIA[  ];  headaches[Y  ];  stroke[  ];  vertigo[  ];  seizures[  ];   paresthesias[  ];  difficulty walking[  ];  Psych:depression[  ]; anxiety[  ];  Endocrine: diabetes[  ];  thyroid dysfunction[  ];       Physical Exam: BP (!) 152/74   Pulse (!) 59   Temp 98.1 F (36.7 C) (Oral)   Resp 17   Ht 5\' 4"  (1.626 m)   Wt 70.8 kg   SpO2 100%   BMI 26.78 kg/m    General appearance: alert, cooperative and no distress Head: Normocephalic, without obvious abnormality, atraumatic Neck: no adenopathy, no carotid bruit, no JVD, supple, symmetrical, trachea midline and thyroid not enlarged, symmetric, no tenderness/mass/nodules Resp: clear to auscultation bilaterally Cardio: regular rate and rhythm, S1, S2 normal, no murmur, click, rub or gallop GI: soft, non-tender; bowel sounds normal; no masses,  no organomegaly Extremities: extremities normal, atraumatic, no  cyanosis or edema Neurologic: Grossly normal  Diagnostic Studies & Laboratory data:     Recent Radiology Findings:   Dg Chest 1 View  Result Date: 01/31/2018 CLINICAL DATA:  Right-sided pneumothorax status post chest tube treatment now placed on water seal. EXAM: CHEST  1 VIEW COMPARISON:  Portable chest x-ray of earlier today FINDINGS: There has been near total re-expansion of the right pneumothorax. The mediastinal shift has been reduced. There is patchy airspace opacity at the right lung base. The left lung is adequately inflated. Patchy airspace opacity persists in the mid upper left lung. The heart is mildly enlarged. The pulmonary vascularity is not engorged. There is calcification in the wall of the aortic arch. The chest tube tip projects over the posterior interspace of the right seventh and eighth ribs. IMPRESSION: Successful near total re-expansion of the right lung. Patchy airspace opacity at the right lung base. Persistent patchy density in the left upper lobe. Electronically Signed   By: David  SwazilandJordan M.D.   On: 01/31/2018 10:40   Dg Chest 1 View  Result Date: 01/31/2018 CLINICAL DATA:  Chest tube placement EXAM: CHEST  1 VIEW COMPARISON:  01/31/2018 FINDINGS: Right chest tube has been placed with continued near complete collapse of the right lung and near 100% pneumothorax. This is not significantly changed. Patchy airspace opacities in the left lung, both left upper lobe and left base have increased slightly since prior study. IMPRESSION: No significant change in near complete right pneumothorax following right chest tube placement. Worsening patchy left airspace disease. Electronically Signed   By: Charlett NoseKevin  Dover M.D.   On: 01/31/2018 10:09   Dg Chest Portable 1 View  Result Date: 01/31/2018 CLINICAL DATA:  Increased shortness of breath and decreased oxygen saturation this morning. History of COPD, previous pneumothorax. EXAM: PORTABLE CHEST 1 VIEW COMPARISON:  Portable chest x-ray of  January 28, 2018 FINDINGS: There is been interval reaccumulation of a near-total right-sided pneumothorax. Minimal shift of the mediastinum from right to left is present and new. On the left there is patchy increased density in the upper lobe which is stable. There is no left-sided pneumothorax nor pleural effusion. The cardiac silhouette is mildly enlarged though stable. IMPRESSION: Near-total right-sided pneumothorax has developed since the earlier study of August 16. This report was discussed by me by telephone with Dr. Harolyn RutherfordShawn Joy at 7:30 a.m. on 31 January 2018. Electronically Signed   By: David  SwazilandJordan M.D.   On: 01/31/2018 07:31     I have independently reviewed the above radiologic studies and discussed with the patient   Recent Lab Findings: Lab Results  Component Value Date   WBC 8.1 01/31/2018   HGB 13.2 01/31/2018   HCT 39.6 01/31/2018   PLT 294 01/31/2018   GLUCOSE 166 (H) 01/31/2018   ALT 15 01/23/2018   AST 21 01/23/2018   NA 142 01/31/2018   K 3.2 (L) 01/31/2018   CL 104 01/31/2018   CREATININE 0.80 01/31/2018   BUN 12 01/31/2018   CO2 29 01/31/2018   INR 1.12 01/23/2018    Assessment / Plan:    1. 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 She will continue to have recurrent pneumothorax without treatment.  We continue to recommend VATS procedure, which is planned for tomorrow morning... Currently patient is agreeable to proceed.  However she does wish to speak with her husband and family prior to proceeding 2. CV- NSR, +HTN- will order prn hydralazine for HTN 3. Headache- chronic for patient, will start  Tylenol 4. Pulm- chest tube in place, no air leak present, follow up CXR shows near total re-expansion of her right lung 5. Admit to [facility], NPO at midnight, for VATs in AM  I  spent 55 minutes counseling the patient face to face.   Carolyn Peon Barrett PA-C 01/31/2018 4:27 PM  Agree with above  assessment , exam, and plan. I have discussed the benefits and risks of VATS for recurrent spontaneous pneumothorax with the patient. We discussed the anesthesia, the incisions, and expected recovery.We hope to proceed with surgery in am with patients consent.

## 2018-01-31 NOTE — ED Provider Notes (Signed)
Physical Exam  BP (!) 159/83 (BP Location: Right Arm)   Pulse 64   Temp 98.1 F (36.7 C) (Oral)   Resp (!) 23   Ht 5\' 4"  (1.626 m)   Wt 70.8 kg   SpO2 100%   BMI 26.78 kg/m   Physical Exam  ED Course/Procedures   Clinical Course as of Feb 01 1415  Mon Jan 31, 2018  [id number] Spoke with Dr. Dorris FetchHendrickson, CT surgery. States he is unavailable for several hours due to currently being in surgery. Recommends we call Dr. Donata ClayVan Trigt directly since he treated her last time.    [SJ]  [id number] Paged Dr. Donata ClayVan Trigt using the number given to me by Dr. Dorris FetchHendrickson.   [SJ]  0913 Thomas from [facility] called stating Dr. Donata ClayVan Trigt requesting [facility]. Requesting admit orders be placed, Telemetry inpatient on 4E or 2C.   [SJ]    Clinical Course User Index [SJ] Joy, Shawn C, PA-C   Procedure notes: Sedation Critical Care Chest tube placement   .Sedation Date/Time: 01/31/2018 9:35 AM Performed by: Derwood KaplanNanavati, Mohamad Bruso, MD Authorized by: Derwood KaplanNanavati, Gaelyn Tukes, MD   Consent:    Consent obtained:  Verbal and written   Consent given by:  Patient   Risks discussed:  Allergic reaction, dysrhythmia, inadequate sedation, nausea, prolonged hypoxia resulting in organ damage, prolonged sedation necessitating reversal, respiratory compromise necessitating ventilatory assistance and intubation and vomiting   Alternatives discussed:  Analgesia without sedation, anxiolysis and regional anesthesia Universal protocol:    Procedure explained and questions answered to patient or proxy's satisfaction: yes     Relevant documents present and verified: yes     Test results available and properly labeled: yes     Imaging studies available: yes     Required blood products, implants, devices, and special equipment available: yes     Site/side marked: yes     Immediately prior to procedure a time out was called: yes     Patient identity confirmation method:  Verbally with patient Indications:   Procedure necessitating sedation performed by:  Physician performing sedation   Intended level of sedation:  Deep Pre-sedation assessment:    Time since last food or drink:  9    ASA classification: class 4 - patient with severe systemic disease that is a constant threat to life     Neck mobility: normal     Mouth opening:  3 or more finger widths   Thyromental distance:  4 finger widths   Mallampati score:  I - soft palate, uvula, fauces, pillars visible   Pre-sedation assessments completed and reviewed: airway patency, cardiovascular function, hydration status, mental status, nausea/vomiting, pain level, respiratory function and temperature   Immediate pre-procedure details:    Reassessment: Patient reassessed immediately prior to procedure     Reviewed: vital signs, relevant labs/tests and NPO status     Verified: bag valve mask available, emergency equipment available, intubation equipment available, IV patency confirmed, oxygen available and suction available   Procedure details (see MAR for exact dosages):    Preoxygenation:  Nasal cannula   Sedation:  Ketamine   Intra-procedure monitoring:  Blood pressure monitoring, cardiac monitor, continuous pulse oximetry, frequent LOC assessments, frequent vital sign checks and continuous capnometry   Intra-procedure events: none     Total Provider sedation time (minutes):  35 Post-procedure details:    Attendance: Constant attendance by certified staff until patient recovered     Recovery: Patient returned to pre-procedure baseline  Post-sedation assessments completed and reviewed: airway patency, cardiovascular function, hydration status, mental status, nausea/vomiting, pain level, respiratory function and temperature     Patient is stable for discharge or admission: yes     Patient tolerance:  Tolerated well, no immediate complications CHEST TUBE INSERTION Date/Time: 01/31/2018 10:10 AM Performed by: Derwood KaplanNanavati, Farhana Fellows, MD Authorized by:  Derwood KaplanNanavati, Waynetta Metheny, MD   Consent:    Consent obtained:  Written   Consent given by:  Patient   Risks discussed:  Bleeding and incomplete drainage   Alternatives discussed:  No treatment Universal protocol:    Procedure explained and questions answered to patient or proxy's satisfaction: yes     Relevant documents present and verified: yes     Test results available and properly labeled: yes     Imaging studies available: yes     Required blood products, implants, devices, and special equipment available: yes     Site/side marked: yes     Immediately prior to procedure a time out was called: yes     Patient identity confirmed:  Arm band Pre-procedure details:    Skin preparation:  ChloraPrep   Preparation: Patient was prepped and draped in the usual sterile fashion   Sedation:    Sedation type:  Deep Anesthesia (see MAR for exact dosages):    Anesthesia method:  Local infiltration   Local anesthetic:  Lidocaine 1% WITH epi Procedure details:    Placement location:  R lateral   Scalpel size:  10   Tube size (Fr):  20   Dissection instrument:  Finger and Kelly clamp   Ultrasound guidance: no     Tension pneumothorax: no     Tube connected to:  Suction   Suture material:  0 silk   Dressing:  4x4 sterile gauze and Xeroform gauze Post-procedure details:    Post-insertion x-ray findings: tube in good position     Patient tolerance of procedure:  Tolerated well, no immediate complications Comments:     Xray was done while I still had not secured the tube to confirm placement given the multiple tracts patients had formed and since it seemed like the tube might be too deep, Tube was pulled back as indeed it was deep. .Critical Care Performed by: Derwood KaplanNanavati, Woodard Perrell, MD Authorized by: Derwood KaplanNanavati, Alajah Witman, MD   Critical care provider statement:    Critical care time (minutes):  36   Critical care start time:  01/31/2018 9:30 AM   Critical care end time:  01/31/2018 2:32 PM   Critical care time  was exclusive of:  Separately billable procedures and treating other patients   Critical care was necessary to treat or prevent imminent or life-threatening deterioration of the following conditions:  Respiratory failure   Critical care was time spent personally by me on the following activities:  Development of treatment plan with patient or surrogate, ordering and review of laboratory studies, ordering and review of radiographic studies, pulse oximetry, re-evaluation of patient's condition, review of old charts, ordering and performing treatments and interventions, obtaining history from patient or surrogate, examination of patient, evaluation of patient's response to treatment and discussions with consultants    MDM         Derwood KaplanNanavati, Marqueze Ramcharan, MD 01/31/18 1433

## 2018-01-31 NOTE — Sedation Documentation (Signed)
Patient pain 7/10 after medication intervention.

## 2018-01-31 NOTE — ED Notes (Signed)
Bed: [id number] Expected date:  Expected time:  Means of arrival:  Comments: EMS 62 yo female Resp distress/COPD/hx pneumothorax O2 sat 86%

## 2018-01-31 NOTE — ED Provider Notes (Signed)
[facility]-EMERGENCY DEPT Provider Note   CSN: [id number] Arrival date & time: 01/31/18  0554     History   Chief Complaint Chief Complaint  Patient presents with  . Shortness of Breath    HPI Carolyn RiceDorothy A Huynh is a 62 y.o. female.  HPI   Carolyn Huynh is a 62 y.o. female, with a history of HTN, dementia, and right pneumothorax, presenting to the ED with shortness of breath beginning around 3 AM this morning.  States she got out of bed to use the restroom and felt short of breath with pain on the right side of the chest.  Pain is currently moderate, throughout the right chest, nonradiating.  Patient states, "It feels just like it did when I had my collapsed long before." Denies cough, fever, N/V/D, abdominal pain, or any other complaints.   I spoke with the patient regarding the need for VATS procedure.  I explained the purpose of the procedure and the likelihood that she will continue to have pneumothoraces if the underlying problem is not found and corrected.  She states she is now ready to have this procedure performed.   Past Medical History:  Diagnosis Date  . Dementia   . Hypertension   . Pneumothorax     Patient Active Problem List   Diagnosis Date Noted  . Pneumothorax on right 01/22/2018  . Pneumothorax 01/15/2018    Past Surgical History:  Procedure Laterality Date  . CHEST TUBE INSERTION       OB History   None      Home Medications    Prior to Admission medications   Medication Sig Start Date End Date Taking? Authorizing Provider  acetaminophen (TYLENOL) 325 MG tablet Take 2 tablets (650 mg total) by mouth every 6 (six) hours as needed for mild pain (or Fever >/= 101). 01/28/18  Yes Conte, Tessa N, PA-C  albuterol (PROVENTIL HFA;VENTOLIN HFA) 108 (90 Base) MCG/ACT inhaler Inhale 1-2 puffs into the lungs every 6 (six) hours as needed for wheezing or shortness of breath.   Yes [provider]  HYDROcodone-acetaminophen  (NORCO/VICODIN) 5-325 MG tablet Take 1-2 tablets by mouth every 4 (four) hours as needed for moderate pain. 01/28/18  Yes Sharlene Doryonte, Tessa N, PA-C    Family History History reviewed. No pertinent family history.  Social History Social History   Tobacco Use  . Smoking status: Never Smoker  . Smokeless tobacco: Never Used  Substance Use Topics  . Alcohol use: Not Currently  . Drug use: Never     Allergies   Aspirin   Review of Systems Review of Systems  Constitutional: Negative for chills, diaphoresis and fever.  Respiratory: Positive for shortness of breath. Negative for cough.   Cardiovascular: Positive for chest pain. Negative for leg swelling.  Gastrointestinal: Negative for abdominal pain, diarrhea, nausea and vomiting.  All other systems reviewed and are negative.    Physical Exam Updated Vital Signs Pulse (!) 111   Temp 98.3 F (36.8 C) (Oral)   Resp (!) 36   SpO2 97%   Physical Exam  Constitutional: She appears well-developed and well-nourished. No distress.  HENT:  Head: Normocephalic and atraumatic.  Eyes: Conjunctivae are normal.  Neck: Neck supple.  Cardiovascular: Regular rhythm, normal heart sounds and intact distal pulses. Tachycardia present.  Pulmonary/Chest: Tachypnea noted. She has decreased breath sounds in the right middle field and the right lower field.  Abdominal: Soft. There is no tenderness. There is no guarding.  Musculoskeletal: She  exhibits no edema.  Lymphadenopathy:    She has no cervical adenopathy.  Neurological: She is alert.  Skin: Skin is warm and dry. She is not diaphoretic.  Psychiatric: She has a normal mood and affect. Her behavior is normal.  Nursing note and vitals reviewed.    ED Treatments / Results  Labs (all labs ordered are listed, but only abnormal results are displayed) Labs Reviewed  BASIC METABOLIC PANEL - Abnormal; Notable for the following components:      Result Value   Potassium 3.2 (*)    Glucose, Bld  166 (*)    All other components within normal limits  CBC  I-STAT TROPONIN, ED    EKG None  Radiology Dg Chest 1 View  Result Date: 01/31/2018 CLINICAL DATA:  Right-sided pneumothorax status post chest tube treatment now placed on water seal. EXAM: CHEST  1 VIEW COMPARISON:  Portable chest x-ray of earlier today FINDINGS: There has been near total re-expansion of the right pneumothorax. The mediastinal shift has been reduced. There is patchy airspace opacity at the right lung base. The left lung is adequately inflated. Patchy airspace opacity persists in the mid upper left lung. The heart is mildly enlarged. The pulmonary vascularity is not engorged. There is calcification in the wall of the aortic arch. The chest tube tip projects over the posterior interspace of the right seventh and eighth ribs. IMPRESSION: Successful near total re-expansion of the right lung. Patchy airspace opacity at the right lung base. Persistent patchy density in the left upper lobe. Electronically Signed   By: David  SwazilandJordan M.D.   On: 01/31/2018 10:40   Dg Chest 1 View  Result Date: 01/31/2018 CLINICAL DATA:  Chest tube placement EXAM: CHEST  1 VIEW COMPARISON:  01/31/2018 FINDINGS: Right chest tube has been placed with continued near complete collapse of the right lung and near 100% pneumothorax. This is not significantly changed. Patchy airspace opacities in the left lung, both left upper lobe and left base have increased slightly since prior study. IMPRESSION: No significant change in near complete right pneumothorax following right chest tube placement. Worsening patchy left airspace disease. Electronically Signed   By: Charlett NoseKevin  Dover M.D.   On: 01/31/2018 10:09   Dg Chest Portable 1 View  Result Date: 01/31/2018 CLINICAL DATA:  Increased shortness of breath and decreased oxygen saturation this morning. History of COPD, previous pneumothorax. EXAM: PORTABLE CHEST 1 VIEW COMPARISON:  Portable chest x-ray of January 28, 2018 FINDINGS: There is been interval reaccumulation of a near-total right-sided pneumothorax. Minimal shift of the mediastinum from right to left is present and new. On the left there is patchy increased density in the upper lobe which is stable. There is no left-sided pneumothorax nor pleural effusion. The cardiac silhouette is mildly enlarged though stable. IMPRESSION: Near-total right-sided pneumothorax has developed since the earlier study of August 16. This report was discussed by me by telephone with Dr. Harolyn RutherfordShawn Joy at 7:30 a.m. on 31 January 2018. Electronically Signed   By: David  [place]Jordan M.D.   On: 01/31/2018 07:31    Procedures .Critical Care Performed by: Anselm PancoastJoy, Shawn C, PA-C Authorized by: Anselm PancoastJoy, Shawn C, PA-C   Critical care provider statement:    Critical care time (minutes):  35   Critical care time was exclusive of:  Separately billable procedures and treating other patients   Critical care was necessary to treat or prevent imminent or life-threatening deterioration of the following conditions:  Respiratory failure   Critical care was  time spent personally by me on the following activities:  Development of treatment plan with patient or surrogate, discussions with consultants, evaluation of patient's response to treatment, examination of patient, obtaining history from patient or surrogate, review of old charts, re-evaluation of patient's condition, pulse oximetry, ordering and review of radiographic studies, ordering and review of laboratory studies and ordering and performing treatments and interventions   I assumed direction of critical care for this patient from another provider in my specialty: no     (including critical care time)  Medications Ordered in ED Medications  ondansetron (ZOFRAN) injection 4 mg (4 mg Intravenous Given 01/31/18 0825)  morphine 4 MG/ML injection 4 mg (4 mg Intravenous Given 01/31/18 0827)  lidocaine (PF) (XYLOCAINE) 1 % injection 0-30 mL (30 mLs Intradermal  Given by Other 01/31/18 0941)  ketamine 50 mg in normal saline 5 mL (10 mg/mL) syringe (71 mg Intravenous Given 01/31/18 0944)  ketamine (KETALAR) injection (30 mg Intravenous Given 01/31/18 0951)  morphine 4 MG/ML injection 4 mg (4 mg Intravenous Given 01/31/18 1056)  morphine 4 MG/ML injection 4 mg (4 mg Intravenous Given 01/31/18 1431)     Initial Impression / Assessment and Plan / ED Course  I have reviewed the triage vital signs and the nursing notes.  Pertinent labs & imaging results that were available during my care of the patient were reviewed by me and considered in my medical decision making (see chart for details).  Clinical Course as of Jan 31 1625  Mon Jan 31, 2018  2130 Spoke with Dr. Dorris Fetch, CT surgery. States he is unavailable for several hours due to currently being in surgery. Recommends we call Dr. Donata Clay directly since he treated her last time.    [SJ]  [phone] Paged Dr. Donata Clay using the number given to me by Dr. Dorris Fetch.   [SJ]  0913 Thomas from [facility] called stating Dr. Donata Clay requesting patient be transferred to [facility]. Requesting admit orders be placed, Telemetry inpatient on 4E or 2C.   [SJ]  1435 Spoke with Dr. Donata Clay.  States at 4:00 if the patient does not yet have an assigned bed at [facility], please admit via the hospitalist here at [facility].  Patient would then be transferred early tomorrow morning. Patient may eat and drink, n.p.o. at midnight.   [SJ]  1505 Spoke with Dr. Marland Mcalpine, hospitalist. States patient can not be admitted here at [facility] and then be transferred for admission at [facility].   [SJ]    Clinical Course User Index [SJ] Joy, Shawn C, PA-C    Presents with shortness of breath, increased work of breathing and hypoxia.  Noted to have large pneumothorax on the right.  Chest tube placed by Dr. Rhunette Croft with evidence of reinflation of the right lung.  Patient also clinically improved with resolution in her shortness of  breath. Admitted via CT surgery.  End of shift patient care handoff report given to Arthor Captain, PA-C. Plan: Patient is awaiting telemetry bed at [facility].  Plan is for VATS procedure in the morning at [facility]. May need to transfer to [facility] ED later this evening so patient is not still in the [facility] ED when her surgery time arrives.     Findings and plan of care discussed with Dr. Bebe Shaggy and then Dr. Rhunette Croft after EDP shift change. Drs. [place]line and [place] both personally evaluated and examined this patient.  Vitals:   01/31/18 1530 01/31/18 1545 01/31/18 1600 01/31/18 1615  BP: Marland Kitchen)  149/74 (!) 158/77 (!) 147/83 (!) 152/74  Pulse: 64  63 (!) 59  Resp: (!) 22 (!) 24 17 17   Temp:      TempSrc:      SpO2: 100%  100% 100%  Weight:      Height:         Final Clinical Impressions(s) / ED Diagnoses   Final diagnoses:  Pneumothorax on right  Acute respiratory failure with hypoxia [facility])    ED Discharge Orders    None       Concepcion Living 01/31/18 1626    Zadie Rhine, MD 02/01/18 [phone]

## 2018-01-31 NOTE — ED Notes (Signed)
NRB placed per dr. Bebe Shaggywickline

## 2018-01-31 NOTE — Sedation Documentation (Signed)
Patient adequately sedated. Patient VSS at this time.  Provider first cut to right chest at this time. Patient tolerated well. Respiratory at bedside monitoring patient respiratory status.

## 2018-01-31 NOTE — ED Provider Notes (Signed)
Patient re-evaluated. Remains stable from respiratory and VS standpoint. [facility] to [facility].   Pricilla LovelessGoldston, Milia Warth, MD 01/31/18 [phone]

## 2018-01-31 NOTE — Progress Notes (Signed)
2 attempts at PIV unsuccessful. Pt refuses 2nd assess by VAS Team at this time. RN made aware.

## 2018-01-31 NOTE — Sedation Documentation (Signed)
Physician securing chest tube in place with sutures after xray verification.

## 2018-01-31 NOTE — Sedation Documentation (Addendum)
Patient pain 10/10. Pain located where right sided chest tube was inserted. Morphine given to help with pain. Patient vitals remain stable at this time.

## 2018-01-31 NOTE — Sedation Documentation (Signed)
Physician placed 20G chest tube to right patient chest. Xray at patient bedside to verify tube placement

## 2018-01-31 NOTE — ED Notes (Signed)
ED TO INPATIENT HANDOFF REPORT  Name/Age/Gender Carolyn Huynh 62 y.o. female  Code Status Code Status History    Date Active Date Inactive Code Status Order ID Comments User Context   01/22/2018 0825 01/29/2018 1719 Full Code [id number]  Ivin Poot, MD ED   01/15/2018 1101 01/17/2018 1509 Full Code [id number]  [facility]      Home/SNF/Other Home  Chief Complaint shob  Level of Care/Admitting Diagnosis ED Disposition    ED Disposition Condition [facility] Area: [place] [[id number]]  Level of Care: Telemetry [5]  Diagnosis: Recurrent spontaneous pneumothorax [735329]  Admitting Physician: Ivin Poot [[id number]]  Attending Physician: Prescott Gum, PETER [[id number]]  Estimated length of stay: 5 - 7 days  Certification:: I certify this patient will need inpatient services for at least 2 midnights  PT Class (Do Not Modify): Inpatient [101]  PT Acc Code (Do Not Modify): Private [1]       Medical History Past Medical History:  Diagnosis Date  . Dementia   . Hypertension   . Pneumothorax     Allergies Allergies  Allergen Reactions  . Aspirin Shortness Of Breath    IV Location/Drains/Wounds Patient Lines/Drains/Airways Status   Active Line/Drains/Airways    Name:   Placement date:   Placement time:   Site:   Days:   Peripheral IV 01/31/18 Right;Anterior Wrist   01/31/18    0650    Wrist   less than 1   Chest Tube 1 Right;Lateral Pleural 20 Fr.   01/31/18    1022    Pleural   less than 1   Incision (Closed) 01/25/18 Other (Comment) Right   01/25/18    1847     6          Labs/Imaging Results for orders placed or performed during the hospital encounter of 01/31/18 (from the past 48 hour(s))  CBC     Status: None   Collection Time: 01/31/18  6:19 AM  Result Value Ref Range   WBC 8.1 4.0 - 10.5 K/uL   RBC 4.21 3.87 - 5.11 MIL/uL   Hemoglobin 13.2 12.0 - 15.0 g/dL   HCT 39.6 36.0 - 46.0 %   MCV 94.1 78.0 - 100.0 fL   MCH  31.4 26.0 - 34.0 pg   MCHC 33.3 30.0 - 36.0 g/dL   RDW 13.3 11.5 - 15.5 %   Platelets 294 150 - 400 K/uL    Comment: Performed at [facility], [place] [address], [place], [place] [address]  I-Stat Troponin, ED (not at [facility])     Status: None   Collection Time: 01/31/18  6:23 AM  Result Value Ref Range   Troponin i, poc 0.00 0.00 - 0.08 ng/mL   Comment 3            Comment: Due to the release kinetics of cTnI, a negative result within the first hours of the onset of symptoms does not rule out myocardial infarction with certainty. If myocardial infarction is still suspected, repeat the test at appropriate intervals.   Basic metabolic panel     Status: Abnormal   Collection Time: 01/31/18  6:48 AM  Result Value Ref Range   Sodium 142 135 - 145 mmol/L   Potassium 3.2 (L) 3.5 - 5.1 mmol/L   Chloride 104 98 - 111 mmol/L   CO2 29 22 - 32 mmol/L   Glucose, Bld 166 (H) 70 - 99 mg/dL  BUN 12 8 - 23 mg/dL   Creatinine, Ser 0.80 0.44 - 1.00 mg/dL   Calcium 9.2 8.9 - 10.3 mg/dL   GFR calc non Af Amer >60 >60 mL/min   GFR calc Af Amer >60 >60 mL/min    Comment: (NOTE) The eGFR has been calculated using the CKD EPI equation. This calculation has not been validated in all clinical situations. eGFR's persistently <60 mL/min signify possible Chronic Kidney Disease.    Anion gap 9 5 - 15    Comment: Performed at [facility], [place] [address], [place], [place] [address]   Dg Chest 1 View  Result Date: 01/31/2018 CLINICAL DATA:  Right-sided pneumothorax status post chest tube treatment now placed on water seal. EXAM: CHEST  1 VIEW COMPARISON:  Portable chest x-ray of earlier today FINDINGS: There has been near total re-expansion of the right pneumothorax. The mediastinal shift has been reduced. There is patchy airspace opacity at the right lung base. The left lung is adequately inflated. Patchy airspace opacity persists in the mid upper left lung. The heart is  mildly enlarged. The pulmonary vascularity is not engorged. There is calcification in the wall of the aortic arch. The chest tube tip projects over the posterior interspace of the right seventh and eighth ribs. IMPRESSION: Successful near total re-expansion of the right lung. Patchy airspace opacity at the right lung base. Persistent patchy density in the left upper lobe. Electronically Signed   By: David  Martinique M.D.   On: 01/31/2018 10:40   Dg Chest 1 View  Result Date: 01/31/2018 CLINICAL DATA:  Chest tube placement EXAM: CHEST  1 VIEW COMPARISON:  01/31/2018 FINDINGS: Right chest tube has been placed with continued near complete collapse of the right lung and near 100% pneumothorax. This is not significantly changed. Patchy airspace opacities in the left lung, both left upper lobe and left base have increased slightly since prior study. IMPRESSION: No significant change in near complete right pneumothorax following right chest tube placement. Worsening patchy left airspace disease. Electronically Signed   By: Rolm Baptise M.D.   On: 01/31/2018 10:09   Dg Chest Portable 1 View  Result Date: 01/31/2018 CLINICAL DATA:  Increased shortness of breath and decreased oxygen saturation this morning. History of COPD, previous pneumothorax. EXAM: PORTABLE CHEST 1 VIEW COMPARISON:  Portable chest x-ray of January 28, 2018 FINDINGS: There is been interval reaccumulation of a near-total right-sided pneumothorax. Minimal shift of the mediastinum from right to left is present and new. On the left there is patchy increased density in the upper lobe which is stable. There is no left-sided pneumothorax nor pleural effusion. The cardiac silhouette is mildly enlarged though stable. IMPRESSION: Near-total right-sided pneumothorax has developed since the earlier study of August 16. This report was discussed by me by telephone with Dr. Arlean Hopping at 7:30 a.m. on 31 January 2018. Electronically Signed   By: David  Martinique M.D.   On:  01/31/2018 07:31    Pending Labs [employer] (From admission, onward)    Start     Ordered   Signed and Held  CBC  Once,   R     Signed and Held   Signed and Held  Blood gas, arterial on room air  Once,   R     Signed and Held   Signed and [employer]  Once,   R     Signed and Held   Signed and Held  APTT  Once,   R  Signed and Held   Signed and Held  Urinalysis, Routine w reflex microscopic  Once,   R     Signed and Held   Signed and Held  Type and screen [place]  Once,   R    Comments:  [place]    Signed and Held          Vitals/Pain Today's Vitals   01/31/18 1615 01/31/18 1630 01/31/18 1645 01/31/18 1700  BP: (!) 152/74 (!) 168/77 (!) 144/74 (!) 151/77  Pulse: (!) 59 64 62 (!) 59  Resp: 17 (!) 25 (!) 23 19  Temp:      TempSrc:      SpO2: 100% 100% 100% 100%  Weight:      Height:      PainSc:        Isolation Precautions No active isolations  Medications Medications  acetaminophen (TYLENOL) tablet 650 mg (has no administration in time range)  hydrALAZINE (APRESOLINE) injection 10 mg (has no administration in time range)  ondansetron (ZOFRAN) tablet 4 mg (has no administration in time range)  levalbuterol (XOPENEX) nebulizer solution 0.63 mg (has no administration in time range)  docusate sodium (COLACE) capsule 100 mg (has no administration in time range)  alum & mag hydroxide-simeth (MAALOX/MYLANTA) 200-200-20 MG/5ML suspension 30 mL (has no administration in time range)  oxyCODONE (Oxy IR/ROXICODONE) immediate release tablet 5 mg (has no administration in time range)  morphine 2 MG/ML injection 2 mg (has no administration in time range)  ondansetron (ZOFRAN) injection 4 mg (4 mg Intravenous Given 01/31/18 0825)  morphine 4 MG/ML injection 4 mg (4 mg Intravenous Given 01/31/18 0827)  lidocaine (PF) (XYLOCAINE) 1 % injection 0-30 mL (30 mLs Intradermal Given by Other 01/31/18 0941)  ketamine 50 mg in  normal saline 5 mL (10 mg/mL) syringe (71 mg Intravenous Given 01/31/18 0944)  ketamine (KETALAR) injection (30 mg Intravenous Given 01/31/18 0951)  morphine 4 MG/ML injection 4 mg (4 mg Intravenous Given 01/31/18 1056)  morphine 4 MG/ML injection 4 mg (4 mg Intravenous Given 01/31/18 1431)    Mobility walks with person assist

## 2018-01-31 NOTE — Progress Notes (Signed)
RN attempted to get pt to sign consent form for VATS procedure in the morning. Pt refused to sign consent form saying, "I need to talk to my husband and family before I have a procedure and will not give consent."

## 2018-01-31 NOTE — ED Notes (Signed)
Per report from admitting physician, patient is scheduled for 2nd case of the day tomorrow (02/01/18) [facility]. Patient is to be NPO after midnight.

## 2018-01-31 NOTE — ED Triage Notes (Signed)
Pt arrives home with c/o SOB. Originally sats were 86% and diminished throughout. Pt rec'd albuterol 5, and also a duoneb en route. Hx of COPD and right pneumothorax.

## 2018-01-31 NOTE — ED Provider Notes (Signed)
Patient seen/examined in the Emergency Department in conjunction with Midlevel Provider Joy Patient reports shortness of breath and chest pain.  History of pneumothorax Exam : Awake alert, tachypnea, decreased breath sounds on right. Plan: Strong suspicion for recurrent pneumothorax.  This will be her third PTX in 1 month.  She has already had 2 tube thoracostomies this month.  If chest x-ray confirms recurrent PTX, will consult CT surgery     ED ECG REPORT   Date: 01/31/2018 06:05am  Rate: 112   Rhythm: sinus tachycardia  QRS Axis: normal  Intervals: normal  ST/T Wave abnormalities: nonspecific ST changes  Conduction Disutrbances:none  Narrative Interpretation: PVCs noted   I have personally reviewed the EKG tracing and agree with the computerized printout as noted.    Zadie RhineWickline, Kenyon Eshleman, MD 01/31/18 [phone]

## 2018-01-31 NOTE — Sedation Documentation (Signed)
Chest tube dressed by EDMD Nanavati with xeroform gauze, 4x4 guaze, and mepore tape. Patient tolerated well. Patient AxOx4. Chest xray ordered to verify placement.

## 2018-01-31 NOTE — ED Notes (Signed)
[facility].

## 2018-02-01 ENCOUNTER — Inpatient Hospital Stay (HOSPITAL_COMMUNITY): Payer: Medicaid - Out of State

## 2018-02-01 ENCOUNTER — Ambulatory Visit: Payer: Self-pay

## 2018-02-01 ENCOUNTER — Inpatient Hospital Stay (HOSPITAL_COMMUNITY): Payer: Medicaid - Out of State | Admitting: Anesthesiology

## 2018-02-01 ENCOUNTER — Encounter (HOSPITAL_COMMUNITY)
Admission: EM | Disposition: A | Discharge status: Home / Self Care | Payer: Self-pay | Source: Home / Self Care | Attending: Cardiothoracic Surgery

## 2018-02-01 ENCOUNTER — Encounter (HOSPITAL_COMMUNITY): Payer: Self-pay | Admitting: Anesthesiology

## 2018-02-01 DIAGNOSIS — J939 Pneumothorax, unspecified: Secondary | ICD-10-CM | POA: Diagnosis present

## 2018-02-01 HISTORY — PX: VIDEO ASSISTED THORACOSCOPY: SHX5073

## 2018-02-01 LAB — BLOOD GAS, ARTERIAL
Acid-Base Excess: 4.7 mmol/L — ABNORMAL HIGH (ref 0.0–2.0)
Bicarbonate: 29.4 mmol/L — ABNORMAL HIGH (ref 20.0–28.0)
Drawn by: 51133
FIO2: 21
O2 Saturation: 94.6 %
Patient temperature: 98.6
pCO2 arterial: 49.8 mmHg — ABNORMAL HIGH (ref 32.0–48.0)
pH, Arterial: 7.389 (ref 7.350–7.450)
pO2, Arterial: 70.3 mmHg — ABNORMAL LOW (ref 83.0–108.0)

## 2018-02-01 LAB — CBC WITH DIFFERENTIAL/PLATELET
Abs Immature Granulocytes: 0 10*3/uL (ref 0.0–0.1)
Basophils Absolute: 0.1 10*3/uL (ref 0.0–0.1)
Basophils Relative: 1 %
Eosinophils Absolute: 0.4 10*3/uL (ref 0.0–0.7)
Eosinophils Relative: 5 %
HCT: 35.2 % — ABNORMAL LOW (ref 36.0–46.0)
Hemoglobin: 11.2 g/dL — ABNORMAL LOW (ref 12.0–15.0)
Immature Granulocytes: 0 %
Lymphocytes Relative: 19 %
Lymphs Abs: 1.3 10*3/uL (ref 0.7–4.0)
MCH: 30.6 pg (ref 26.0–34.0)
MCHC: 31.8 g/dL (ref 30.0–36.0)
MCV: 96.2 fL (ref 78.0–100.0)
Monocytes Absolute: 0.6 10*3/uL (ref 0.1–1.0)
Monocytes Relative: 9 %
Neutro Abs: 4.3 10*3/uL (ref 1.7–7.7)
Neutrophils Relative %: 66 %
Platelets: 222 10*3/uL (ref 150–400)
RBC: 3.66 MIL/uL — ABNORMAL LOW (ref 3.87–5.11)
RDW: 12.9 % (ref 11.5–15.5)
WBC: 6.6 10*3/uL (ref 4.0–10.5)

## 2018-02-01 LAB — URINALYSIS, ROUTINE W REFLEX MICROSCOPIC
Bilirubin Urine: NEGATIVE
Glucose, UA: NEGATIVE mg/dL
Hgb urine dipstick: NEGATIVE
Ketones, ur: NEGATIVE mg/dL
Nitrite: NEGATIVE
Protein, ur: NEGATIVE mg/dL
Specific Gravity, Urine: 1.028 (ref 1.005–1.030)
pH: 5 (ref 5.0–8.0)

## 2018-02-01 LAB — POCT I-STAT 3, ART BLOOD GAS (G3+)
Acid-Base Excess: 2 mmol/L (ref 0.0–2.0)
Bicarbonate: 29 mmol/L — ABNORMAL HIGH (ref 20.0–28.0)
O2 Saturation: 97 %
Patient temperature: 97.5
TCO2: 31 mmol/L (ref 22–32)
pCO2 arterial: 53.2 mmHg — ABNORMAL HIGH (ref 32.0–48.0)
pH, Arterial: 7.342 — ABNORMAL LOW (ref 7.350–7.450)
pO2, Arterial: 99 mmHg (ref 83.0–108.0)

## 2018-02-01 LAB — GLUCOSE, CAPILLARY: Glucose-Capillary: 138 mg/dL — ABNORMAL HIGH (ref 70–99)

## 2018-02-01 LAB — SURGICAL PCR SCREEN
MRSA, PCR: NEGATIVE
Staphylococcus aureus: NEGATIVE

## 2018-02-01 LAB — PREPARE RBC (CROSSMATCH)

## 2018-02-01 LAB — APTT: aPTT: 34 seconds (ref 24–36)

## 2018-02-01 LAB — PROTIME-INR
INR: 1.19
Prothrombin Time: 15 seconds (ref 11.4–15.2)

## 2018-02-01 SURGERY — VIDEO ASSISTED THORACOSCOPY
Anesthesia: General | Site: Chest | Laterality: Right

## 2018-02-01 MED ORDER — VASOPRESSIN 20 UNIT/ML IV SOLN
INTRAVENOUS | Status: DC | PRN
  Administered 2018-02-01: 3 [IU] via INTRAVENOUS

## 2018-02-01 MED ORDER — CEFAZOLIN SODIUM-DEXTROSE 2-4 GM/100ML-% IV SOLN
2.0000 g | Freq: Three times a day (TID) | INTRAVENOUS | Status: AC
  Administered 2018-02-01 – 2018-02-02 (×2): 2 g via INTRAVENOUS
  Filled 2018-02-01 (×3): qty 100

## 2018-02-01 MED ORDER — ROCURONIUM BROMIDE 50 MG/5ML IV SOSY
PREFILLED_SYRINGE | INTRAVENOUS | Status: DC | PRN
  Administered 2018-02-01: 50 mg via INTRAVENOUS
  Administered 2018-02-01: 10 mg via INTRAVENOUS

## 2018-02-01 MED ORDER — FENTANYL CITRATE (PF) 100 MCG/2ML IJ SOLN
INTRAMUSCULAR | Status: AC
  Filled 2018-02-01: qty 2

## 2018-02-01 MED ORDER — LACTATED RINGERS IV SOLN
INTRAVENOUS | Status: DC | PRN
  Administered 2018-02-01: 11:00:00 via INTRAVENOUS

## 2018-02-01 MED ORDER — ONDANSETRON HCL 4 MG/2ML IJ SOLN: 4.0000 mg | Freq: Once | INTRAMUSCULAR | Status: DC | PRN

## 2018-02-01 MED ORDER — ACETAMINOPHEN 160 MG/5ML PO SOLN: 1000.0000 mg | Freq: Four times a day (QID) | ORAL | Status: DC

## 2018-02-01 MED ORDER — TRAMADOL HCL 50 MG PO TABS
50.0000 mg | ORAL_TABLET | Freq: Four times a day (QID) | ORAL | Status: DC | PRN
  Administered 2018-02-02 – 2018-02-06 (×3): 100 mg via ORAL
  Filled 2018-02-01: qty 1
  Filled 2018-02-01 (×3): qty 2

## 2018-02-01 MED ORDER — SODIUM CHLORIDE 0.9% FLUSH: 9.0000 mL | INTRAVENOUS | Status: DC | PRN

## 2018-02-01 MED ORDER — LIDOCAINE 2% (20 MG/ML) 5 ML SYRINGE
INTRAMUSCULAR | Status: AC
  Filled 2018-02-01: qty 5

## 2018-02-01 MED ORDER — SODIUM CHLORIDE 0.9 % IV SOLN
INTRAVENOUS | Status: DC | PRN
  Administered 2018-02-01: 20 ug/min via INTRAVENOUS

## 2018-02-01 MED ORDER — ACETAMINOPHEN 500 MG PO TABS
1000.0000 mg | ORAL_TABLET | Freq: Four times a day (QID) | ORAL | Status: DC
  Administered 2018-02-01 – 2018-02-06 (×17): 1000 mg via ORAL
  Filled 2018-02-01 (×21): qty 2

## 2018-02-01 MED ORDER — SODIUM CHLORIDE 0.9 % IV SOLN
INTRAVENOUS | Status: DC | PRN
  Administered 2018-02-01: 250 mL via INTRAVENOUS

## 2018-02-01 MED ORDER — ONDANSETRON HCL 4 MG/2ML IJ SOLN: 4.0000 mg | Freq: Four times a day (QID) | INTRAMUSCULAR | Status: DC | PRN

## 2018-02-01 MED ORDER — ONDANSETRON HCL 4 MG/2ML IJ SOLN
4.0000 mg | Freq: Four times a day (QID) | INTRAMUSCULAR | Status: DC | PRN
  Administered 2018-02-01 – 2018-02-06 (×6): 4 mg via INTRAVENOUS
  Filled 2018-02-01 (×6): qty 2

## 2018-02-01 MED ORDER — NALOXONE HCL 0.4 MG/ML IJ SOLN: 0.4000 mg | INTRAMUSCULAR | Status: DC | PRN

## 2018-02-01 MED ORDER — SUGAMMADEX SODIUM 200 MG/2ML IV SOLN
INTRAVENOUS | Status: DC | PRN
  Administered 2018-02-01: 200 mg via INTRAVENOUS

## 2018-02-01 MED ORDER — PHENYLEPHRINE 40 MCG/ML (10ML) SYRINGE FOR IV PUSH (FOR BLOOD PRESSURE SUPPORT)
PREFILLED_SYRINGE | INTRAVENOUS | Status: DC | PRN
  Administered 2018-02-01 (×3): 80 ug via INTRAVENOUS
  Administered 2018-02-01: 120 ug via INTRAVENOUS

## 2018-02-01 MED ORDER — DIPHENHYDRAMINE HCL 50 MG/ML IJ SOLN: 12.5000 mg | Freq: Four times a day (QID) | INTRAMUSCULAR | Status: DC | PRN

## 2018-02-01 MED ORDER — ALBUTEROL SULFATE HFA 108 (90 BASE) MCG/ACT IN AERS
INHALATION_SPRAY | RESPIRATORY_TRACT | Status: DC | PRN
  Administered 2018-02-01 (×2): 6 via RESPIRATORY_TRACT

## 2018-02-01 MED ORDER — PROPOFOL 10 MG/ML IV BOLUS
INTRAVENOUS | Status: DC | PRN
  Administered 2018-02-01: 20 mg via INTRAVENOUS
  Administered 2018-02-01: 100 mg via INTRAVENOUS
  Administered 2018-02-01 (×2): 50 mg via INTRAVENOUS
  Administered 2018-02-01: 10 mg via INTRAVENOUS
  Administered 2018-02-01: 20 mg via INTRAVENOUS

## 2018-02-01 MED ORDER — BISACODYL 5 MG PO TBEC
10.0000 mg | DELAYED_RELEASE_TABLET | Freq: Every day | ORAL | Status: DC
  Administered 2018-02-01 – 2018-02-06 (×6): 10 mg via ORAL
  Filled 2018-02-01 (×6): qty 2

## 2018-02-01 MED ORDER — LIDOCAINE 2% (20 MG/ML) 5 ML SYRINGE
INTRAMUSCULAR | Status: DC | PRN
  Administered 2018-02-01: 10 mg via INTRAVENOUS
  Administered 2018-02-01: 50 mg via INTRAVENOUS

## 2018-02-01 MED ORDER — KCL IN DEXTROSE-NACL 20-5-0.45 MEQ/L-%-% IV SOLN
INTRAVENOUS | Status: DC
  Administered 2018-02-01 – 2018-02-04 (×3): via INTRAVENOUS
  Filled 2018-02-01 (×7): qty 1000

## 2018-02-01 MED ORDER — EPHEDRINE SULFATE-NACL 50-0.9 MG/10ML-% IV SOSY
PREFILLED_SYRINGE | INTRAVENOUS | Status: DC | PRN
  Administered 2018-02-01: 5 mg via INTRAVENOUS

## 2018-02-01 MED ORDER — DEXAMETHASONE SODIUM PHOSPHATE 10 MG/ML IJ SOLN
INTRAMUSCULAR | Status: AC
  Filled 2018-02-01: qty 1

## 2018-02-01 MED ORDER — FENTANYL CITRATE (PF) 250 MCG/5ML IJ SOLN
INTRAMUSCULAR | Status: AC
  Filled 2018-02-01: qty 5

## 2018-02-01 MED ORDER — MIDAZOLAM HCL 5 MG/5ML IJ SOLN
INTRAMUSCULAR | Status: DC | PRN
  Administered 2018-02-01: 1 mg via INTRAVENOUS

## 2018-02-01 MED ORDER — FENTANYL CITRATE (PF) 100 MCG/2ML IJ SOLN
INTRAMUSCULAR | Status: AC
  Administered 2018-02-01: 100 ug
  Filled 2018-02-01: qty 2

## 2018-02-01 MED ORDER — ENOXAPARIN SODIUM 40 MG/0.4ML ~~LOC~~ SOLN
40.0000 mg | SUBCUTANEOUS | Status: DC
  Administered 2018-02-02 – 2018-02-05 (×4): 40 mg via SUBCUTANEOUS
  Filled 2018-02-01 (×5): qty 0.4

## 2018-02-01 MED ORDER — ONDANSETRON HCL 4 MG/2ML IJ SOLN
INTRAMUSCULAR | Status: DC | PRN
  Administered 2018-02-01: 4 mg via INTRAVENOUS

## 2018-02-01 MED ORDER — DEXAMETHASONE SODIUM PHOSPHATE 10 MG/ML IJ SOLN
INTRAMUSCULAR | Status: DC | PRN
  Administered 2018-02-01: 10 mg via INTRAVENOUS

## 2018-02-01 MED ORDER — DIPHENHYDRAMINE HCL 12.5 MG/5ML PO ELIX
12.5000 mg | ORAL_SOLUTION | Freq: Four times a day (QID) | ORAL | Status: DC | PRN
  Filled 2018-02-01: qty 5

## 2018-02-01 MED ORDER — EPHEDRINE 5 MG/ML INJ
INTRAVENOUS | Status: AC
  Filled 2018-02-01: qty 10

## 2018-02-01 MED ORDER — SENNOSIDES-DOCUSATE SODIUM 8.6-50 MG PO TABS
1.0000 | ORAL_TABLET | Freq: Every day | ORAL | Status: DC
  Administered 2018-02-01 – 2018-02-05 (×5): 1 via ORAL
  Filled 2018-02-01 (×5): qty 1

## 2018-02-01 MED ORDER — PANTOPRAZOLE SODIUM 40 MG PO TBEC
40.0000 mg | DELAYED_RELEASE_TABLET | Freq: Every day | ORAL | Status: DC
  Administered 2018-02-01 – 2018-02-06 (×6): 40 mg via ORAL
  Filled 2018-02-01 (×6): qty 1

## 2018-02-01 MED ORDER — FENTANYL CITRATE (PF) 100 MCG/2ML IJ SOLN
INTRAMUSCULAR | Status: DC | PRN
  Administered 2018-02-01 (×2): 50 ug via INTRAVENOUS
  Administered 2018-02-01: 150 ug via INTRAVENOUS

## 2018-02-01 MED ORDER — PHENYLEPHRINE 40 MCG/ML (10ML) SYRINGE FOR IV PUSH (FOR BLOOD PRESSURE SUPPORT)
PREFILLED_SYRINGE | INTRAVENOUS | Status: AC
  Filled 2018-02-01: qty 10

## 2018-02-01 MED ORDER — 0.9 % SODIUM CHLORIDE (POUR BTL) OPTIME
TOPICAL | Status: DC | PRN
  Administered 2018-02-01: 1000 mL

## 2018-02-01 MED ORDER — FENTANYL CITRATE (PF) 100 MCG/2ML IJ SOLN
25.0000 ug | INTRAMUSCULAR | Status: DC | PRN
  Administered 2018-02-01 (×3): 50 ug via INTRAVENOUS

## 2018-02-01 MED ORDER — INSULIN ASPART 100 UNIT/ML ~~LOC~~ SOLN: 0.0000 [IU] | Freq: Four times a day (QID) | SUBCUTANEOUS | Status: DC

## 2018-02-01 MED ORDER — GLYCOPYRROLATE PF 0.2 MG/ML IJ SOSY
PREFILLED_SYRINGE | INTRAMUSCULAR | Status: AC
  Filled 2018-02-01: qty 1

## 2018-02-01 MED ORDER — ORAL CARE MOUTH RINSE
15.0000 mL | Freq: Two times a day (BID) | OROMUCOSAL | Status: DC
  Administered 2018-02-02 – 2018-02-04 (×6): 15 mL via OROMUCOSAL

## 2018-02-01 MED ORDER — FENTANYL 40 MCG/ML IV SOLN
INTRAVENOUS | Status: DC
  Administered 2018-02-01: 16:00:00 via INTRAVENOUS
  Administered 2018-02-01: 80 ug via INTRAVENOUS
  Administered 2018-02-02 (×3): 40 ug via INTRAVENOUS
  Administered 2018-02-02 (×2): 80 ug via INTRAVENOUS
  Administered 2018-02-02: 90 ug via INTRAVENOUS
  Administered 2018-02-02: 70 ug via INTRAVENOUS
  Administered 2018-02-03: 150 ug via INTRAVENOUS
  Administered 2018-02-03: 60 ug via INTRAVENOUS
  Administered 2018-02-03: 1000 ug via INTRAVENOUS
  Administered 2018-02-03: 50 ug via INTRAVENOUS
  Administered 2018-02-04: 80 ug via INTRAVENOUS
  Filled 2018-02-01 (×4): qty 25

## 2018-02-01 MED ORDER — ONDANSETRON HCL 4 MG/2ML IJ SOLN
INTRAMUSCULAR | Status: AC
  Filled 2018-02-01: qty 2

## 2018-02-01 MED ORDER — ROCURONIUM BROMIDE 50 MG/5ML IV SOSY
PREFILLED_SYRINGE | INTRAVENOUS | Status: AC
  Filled 2018-02-01: qty 10

## 2018-02-01 MED ORDER — PROPOFOL 10 MG/ML IV BOLUS
INTRAVENOUS | Status: AC
  Filled 2018-02-01: qty 20

## 2018-02-01 MED ORDER — POTASSIUM CHLORIDE 10 MEQ/50ML IV SOLN: 10.0000 meq | Freq: Every day | INTRAVENOUS | Status: DC | PRN

## 2018-02-01 MED ORDER — MIDAZOLAM HCL 2 MG/2ML IJ SOLN
INTRAMUSCULAR | Status: AC
  Filled 2018-02-01: qty 2

## 2018-02-01 MED ORDER — OXYCODONE HCL 5 MG PO TABS
5.0000 mg | ORAL_TABLET | ORAL | Status: DC | PRN
  Administered 2018-02-03: 5 mg via ORAL
  Administered 2018-02-03 – 2018-02-05 (×6): 10 mg via ORAL
  Filled 2018-02-01 (×2): qty 2
  Filled 2018-02-01: qty 1
  Filled 2018-02-01 (×5): qty 2

## 2018-02-01 SURGICAL SUPPLY — 71 items
BAG DECANTER FOR FLEXI CONT (MISCELLANEOUS) IMPLANT
BLADE SURG 10 STRL SS (BLADE) ×3 IMPLANT
BLADE SURG 11 STRL SS (BLADE) IMPLANT
CANISTER SUCT 3000ML PPV (MISCELLANEOUS) ×3 IMPLANT
CATH KIT ON Q 5IN SLV (PAIN MANAGEMENT) IMPLANT
CATH ROBINSON RED A/P 22FR (CATHETERS) IMPLANT
CATH THORACIC 28FR (CATHETERS) ×3 IMPLANT
CATH THORACIC 36FR (CATHETERS) IMPLANT
CATH THORACIC 36FR RT ANG (CATHETERS) IMPLANT
CLEANER TIP ELECTROSURG 2X2 (MISCELLANEOUS) ×3 IMPLANT
CONT SPEC 4OZ CLIKSEAL STRL BL (MISCELLANEOUS) ×6 IMPLANT
COVER SURGICAL LIGHT HANDLE (MISCELLANEOUS) ×3 IMPLANT
CUTTER ECHEON FLEX ENDO 45 340 (ENDOMECHANICALS) ×3 IMPLANT
DERMABOND ADVANCED (GAUZE/BANDAGES/DRESSINGS) ×2
DERMABOND ADVANCED .7 DNX12 (GAUZE/BANDAGES/DRESSINGS) ×1 IMPLANT
DRAPE LAPAROSCOPIC ABDOMINAL (DRAPES) ×3 IMPLANT
DRAPE SLUSH/WARMER DISC (DRAPES) ×3 IMPLANT
ELECT BLADE 4.0 EZ CLEAN MEGAD (MISCELLANEOUS) ×3
ELECT BLADE 6.5 EXT (BLADE) ×3 IMPLANT
ELECT REM PT RETURN 9FT ADLT (ELECTROSURGICAL) ×3
ELECTRODE BLDE 4.0 EZ CLN MEGD (MISCELLANEOUS) ×1 IMPLANT
ELECTRODE REM PT RTRN 9FT ADLT (ELECTROSURGICAL) ×1 IMPLANT
GAUZE SPONGE 4X4 12PLY STRL (GAUZE/BANDAGES/DRESSINGS) ×3 IMPLANT
GAUZE SPONGE 4X4 12PLY STRL LF (GAUZE/BANDAGES/DRESSINGS) ×3 IMPLANT
GLOVE BIO SURGEON STRL SZ7.5 (GLOVE) ×6 IMPLANT
GOWN STRL REUS W/ TWL LRG LVL3 (GOWN DISPOSABLE) ×3 IMPLANT
GOWN STRL REUS W/TWL LRG LVL3 (GOWN DISPOSABLE) ×6
KIT BASIN OR (CUSTOM PROCEDURE TRAY) ×3 IMPLANT
KIT SUCTION CATH 14FR (SUCTIONS) ×3 IMPLANT
KIT TURNOVER KIT B (KITS) ×3 IMPLANT
NS IRRIG 1000ML POUR BTL (IV SOLUTION) ×3 IMPLANT
PACK CHEST (CUSTOM PROCEDURE TRAY) ×3 IMPLANT
PAD ARMBOARD 7.5X6 YLW CONV (MISCELLANEOUS) ×6 IMPLANT
SEALANT SURG COSEAL 4ML (VASCULAR PRODUCTS) IMPLANT
SEALANT SURG COSEAL 8ML (VASCULAR PRODUCTS) ×3 IMPLANT
SOLUTION ANTI FOG 6CC (MISCELLANEOUS) ×3 IMPLANT
SPONGE TONSIL TAPE 1 RFD (DISPOSABLE) ×3 IMPLANT
STAPLE RELOAD 45MM GOLD (STAPLE) ×9 IMPLANT
SUT ETHILON 3 0 FSL (SUTURE) ×6 IMPLANT
SUT ETHILON 3 0 PS 1 (SUTURE) ×3 IMPLANT
SUT PROLENE 3 0 SH DA (SUTURE) IMPLANT
SUT PROLENE 4 0 RB 1 (SUTURE)
SUT PROLENE 4-0 RB1 .5 CRCL 36 (SUTURE) IMPLANT
SUT SILK  1 MH (SUTURE) ×4
SUT SILK 1 MH (SUTURE) ×2 IMPLANT
SUT SILK 2 0SH CR/8 30 (SUTURE) IMPLANT
SUT SILK 3 0SH CR/8 30 (SUTURE) IMPLANT
SUT VIC AB 1 CTX 18 (SUTURE) IMPLANT
SUT VIC AB 2 TP1 27 (SUTURE) IMPLANT
SUT VIC AB 2-0 CT1 27 (SUTURE) ×4
SUT VIC AB 2-0 CT1 TAPERPNT 27 (SUTURE) ×2 IMPLANT
SUT VIC AB 2-0 CT2 18 VCP726D (SUTURE) IMPLANT
SUT VIC AB 2-0 CTX 36 (SUTURE) IMPLANT
SUT VIC AB 2-0 UR6 27 (SUTURE) ×21 IMPLANT
SUT VIC AB 3-0 SH 18 (SUTURE) IMPLANT
SUT VIC AB 3-0 X1 27 (SUTURE) IMPLANT
SUT VICRYL 0 UR6 27IN ABS (SUTURE) ×3 IMPLANT
SUT VICRYL 2 TP 1 (SUTURE) IMPLANT
SWAB COLLECTION DEVICE MRSA (MISCELLANEOUS) IMPLANT
SWAB CULTURE ESWAB REG 1ML (MISCELLANEOUS) IMPLANT
SYSTEM SAHARA CHEST DRAIN ATS (WOUND CARE) ×3 IMPLANT
TAPE CLOTH SURG 4X10 WHT LF (GAUZE/BANDAGES/DRESSINGS) ×3 IMPLANT
TIP APPLICATOR SPRAY EXTEND 16 (VASCULAR PRODUCTS) IMPLANT
TOWEL GREEN STERILE (TOWEL DISPOSABLE) ×3 IMPLANT
TOWEL GREEN STERILE FF (TOWEL DISPOSABLE) ×3 IMPLANT
TRAP SPECIMEN MUCOUS 40CC (MISCELLANEOUS) IMPLANT
TRAY FOLEY MTR SLVR 16FR STAT (SET/KITS/TRAYS/PACK) ×3 IMPLANT
TROCAR BLADELESS 5MM (ENDOMECHANICALS) IMPLANT
TROCAR XCEL BLADELESS 5X75MML (TROCAR) ×6 IMPLANT
TUNNELER SHEATH ON-Q 11GX8 DSP (PAIN MANAGEMENT) IMPLANT
WATER STERILE IRR 1000ML POUR (IV SOLUTION) ×6 IMPLANT

## 2018-02-01 NOTE — Transfer of Care (Signed)
Immediate Anesthesia Transfer of Care Note  Patient: Carolyn Huynh  Procedure(s) Performed: VIDEO ASSISTED THORACOSCOPY, BLEB STAPLING (Right Chest)  Patient Location: PACU  Anesthesia Type:General  Level of Consciousness: awake, alert  and oriented  Airway & Oxygen Therapy: Patient Spontanous Breathing and Patient connected to face mask oxygen  Post-op Assessment: Report given to RN and Post -op Vital signs reviewed and stable  Post vital signs: Reviewed and stable  Last Vitals:  Vitals Value Taken Time  BP 138/75 02/01/2018  2:25 PM  Temp    Pulse 75 02/01/2018  2:31 PM  Resp 14 02/01/2018  2:31 PM  SpO2 100 % 02/01/2018  2:31 PM  Vitals shown include unvalidated device data.  Last Pain:  Vitals:   02/01/18 0745  TempSrc: Oral  PainSc:       Patients Stated Pain Goal: 2 (02/01/18 0057)  Complications: No apparent anesthesia complications

## 2018-02-01 NOTE — Op Note (Signed)
NAME: Carolyn RiceLEWIS, Sariyah A. MEDICAL RECORD [id number] ACCOUNT [id number] DATE OF BIRTH:[birth year] FACILITY[facility] LOCATION: [facility] PHYSICIAN:PETER VAN TRIGT III, MD  OPERATIVE REPORT  DATE OF PROCEDURE:  02/01/2018  OPERATION: 1.  Right VATS (video-assisted thoracoscopic surgery) with resection of right apical blebs. 2.  Mechanical right pleurodesis.  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   POSTOPERATIVE DIAGNOSES:  Recurrent spontaneous right pneumothorax with chronic obstructive pulmonary disease.   SURGEON:  Mikey BussingPeter Van Trigt III, MD  ASSISTANT:  Doree Fudgeonielle Zimmerman, PA-C  ANESTHESIA:  General.  INDICATIONS:  The patient is a 62 year old reformed smoker with history of COPD and remote history of left thoracotomy for treatment of recurrent left spontaneous or pneumothorax.  Within the past 3 to 4 weeks, the patient has had 3 episodes of  significant right spontaneous pneumothorax.  The first episode was treated with a chest tube.  The second episode was treated with a chest tube in right VATS.  Resection of apical blebs was recommended, but the patient refused.  The third episode  occurred within the past 36 hours and was treated with a chest tube placed in the Emergency Department.  At this time, the patient agreed to proceed with a right VATS for resection of the apical blebs to prevent more recurrent right pneumothorax.  I  discussed the procedure in detail with the patient and her family including the indications and benefits as well as the associated risks of recurrent pneumothorax, infection, stroke, bleeding, and death.  She agreed to proceed with surgery under what I  felt was an informed consent.  DESCRIPTION OF PROCEDURE:  The patient was brought to the operating room and placed supine on the operating table after informed consent had been documented and all final issues addressed with the patient and  family.  A double-lumen endotracheal tube was  placed by the anesthesia team and checked with bronchoscopy.  The patient was turned right side up, and the right chest was prepped and draped as a sterile field.  A proper time-out was performed.  A small incision was made at the tip of the scapula,  and the VATS camera was inserted.  The lung was moderately well collapsed.  There were adhesions at the apex.  There was no malignancy noted.  Two additional VATS incisions were made anteriorly and posteriorly, and using the VATS instruments, camera and the Endo-GIA stapling device, 2 large blebs were resected from the right lung apex, and the staple lines were coated with a fine layer of  medical adhesive--CoSeal.  No further blebs were noted.  A mechanical pleurodesis was then performed using the cautery scratch pad around the circumference of the right upper chest.  A 28-French chest tube was placed and directed to the apex and brought  out through a separate incision.  All 3 of the VATS incisions were closed in layers using Vicryl.  The chest tube site anteriorly, which was the site of the Emergency Department chest tube, was cleaned and closed with interrupted layers of Vicryl and nylon.  The patient was then rolled  supine and the chest tube connected to the Pleur-Evac drainage system.  A chest x-ray was performed in the operating room which showed the tube in good position without pneumothorax, and she was then transported to the recovery room in stable condition.  LN/NUANCE  D:02/01/2018 T:02/01/2018 JOB:[id number]

## 2018-02-01 NOTE — Plan of Care (Signed)
Discussed with family and patient about her concerns for surgery and wants family to be present when doctor explains before signing her consent.  Called family and asked them to be here at [phone] for her surgery at [phone]

## 2018-02-01 NOTE — Anesthesia Procedure Notes (Signed)
Procedure Name: Intubation Date/Time: 02/01/2018 11:46 AM Performed by: Leonides GrillsEllender, Ryan P, MD Pre-anesthesia Checklist: Patient identified, Emergency Drugs available, Suction available and Patient being monitored Patient Re-evaluated:Patient Re-evaluated prior to induction Oxygen Delivery Method: Circle System Utilized Preoxygenation: Pre-oxygenation with 100% oxygen Induction Type: IV induction Ventilation: Mask ventilation without difficulty Laryngoscope Size: Miller and 2 Grade View: Grade I Tube type: Oral Endobronchial tube: Left, EBT position confirmed by auscultation and EBT position confirmed by fiberoptic bronchoscope Number of attempts: 1 Airway Equipment and Method: Stylet and Oral airway Placement Confirmation: ETT inserted through vocal cords under direct vision,  positive ETCO2 and breath sounds checked- equal and bilateral Secured at: 28 cm Tube secured with: Tape Dental Injury: Teeth and Oropharynx as per pre-operative assessment  Comments: Unable to determine position of first DLT, Vt dropped from 500 to 40 with unkown cause during bronchoscope and listening for right sided breast sounds. Decision made to pull original EBT and place new EBT - performed by Dr. Bradley FerrisEllender.

## 2018-02-01 NOTE — Anesthesia Procedure Notes (Signed)
Arterial Line Insertion Start/End8/20/2019 10:08 AM, 02/01/2018 10:12 AM Performed by: Elliot DallyHuggins, Jolissa Kapral, CRNA, CRNA  Preanesthetic checklist: patient identified, IV checked, site marked, risks and benefits discussed, surgical consent, monitors and equipment checked, pre-op evaluation, timeout performed and anesthesia consent Patient sedated Right, radial was placed Catheter size: 20 G Hand hygiene performed   Attempts: 2 Procedure performed without using ultrasound guided technique. Following insertion, Biopatch. Post procedure assessment: normal  Patient tolerated the procedure well with no immediate complications.

## 2018-02-01 NOTE — Plan of Care (Signed)
  Problem: Clinical Measurements: Goal: Ability to maintain clinical measurements within normal limits will improve Outcome: Progressing   Problem: Activity: Goal: Risk for activity intolerance will decrease Outcome: Progressing   Problem: Nutrition: Goal: Adequate nutrition will be maintained Outcome: Progressing   Problem: Elimination: Goal: Will not experience complications related to urinary retention Outcome: Progressing   Problem: Pain Managment: Goal: General experience of comfort will improve Outcome: Progressing   

## 2018-02-01 NOTE — Anesthesia Procedure Notes (Signed)
Central Venous Catheter Insertion Performed by: Leonides GrillsEllender, Ryan P, MD, anesthesiologist Start/End8/20/2019 10:35 AM, 02/01/2018 10:45 AM Patient location: Pre-op. Preanesthetic checklist: patient identified, IV checked, site marked, risks and benefits discussed, surgical consent, monitors and equipment checked, pre-op evaluation, timeout performed and anesthesia consent Position: Trendelenburg Lidocaine 1% used for infiltration and patient sedated Hand hygiene performed  and maximum sterile barriers used  Catheter size: 8 Fr Total catheter length 16. Central line was placed.Double lumen Procedure performed using ultrasound guided technique. Ultrasound Notes:anatomy identified, needle tip was noted to be adjacent to the nerve/plexus identified, no ultrasound evidence of intravascular and/or intraneural injection and image(s) printed for medical record Attempts: 1 Following insertion, dressing applied, line sutured and Biopatch. Post procedure assessment: blood return through all ports, free fluid flow and no air  Patient tolerated the procedure well with no immediate complications.

## 2018-02-01 NOTE — Progress Notes (Signed)
Pre Procedure note for inpatients:   Carolyn Huynh has been scheduled for Procedure(s): VIDEO ASSISTED THORACOSCOPY, BLEB STAPLING (Right) today. The various methods of treatment have been discussed with the patient. After consideration of the risks, benefits and treatment options the patient has consented to the planned procedure.   The patient has been seen and labs reviewed. There are no changes in the patient's condition to prevent proceeding with the planned procedure today.  Recent labs:  Lab Results  Component Value Date   WBC 6.6 02/01/2018   HGB 11.2 (L) 02/01/2018   HCT 35.2 (L) 02/01/2018   PLT 222 02/01/2018   GLUCOSE 166 (H) 01/31/2018   ALT 15 01/23/2018   AST 21 01/23/2018   NA 142 01/31/2018   K 3.2 (L) 01/31/2018   CL 104 01/31/2018   CREATININE 0.80 01/31/2018   BUN 12 01/31/2018   CO2 29 01/31/2018   INR 1.19 02/01/2018    Mikey BussingPeter Van Trigt III, MD 02/01/2018 8:19 AM

## 2018-02-01 NOTE — Brief Op Note (Addendum)
01/31/2018 - 02/01/2018  2:07 PM  PATIENT:  Carolyn Huynh  62 y.o. female  PRE-OPERATIVE DIAGNOSIS:  RECURRENT RIGHT PTX  POST-OPERATIVE DIAGNOSIS:  RECURRENT RIGHT PTX  PROCEDURE:  RIGHT VIDEO ASSISTED THORACOSCOPY, STAPLING BLEBS APEX RIGHT UPPER LOBE x 2, and MECHANICAL PLEURODESIS  SURGEON:  Surgeon(s) and Role:    Kerin PernaVan Trigt, Peter, MD - Primary  PHYSICIAN ASSISTANT: Doree Fudgeonielle Zimmerman PA-C  ANESTHESIA:   general  EBL:  150 mL   BLOOD ADMINISTERED: 1 unit PRBC  DRAINS: 28 French chest tube placed in the right pleural space   SPECIMEN:  Source of Specimen:  Apex RUL blebs  DISPOSITION OF SPECIMEN:  PATHOLOGY  COUNTS CORRECT:  YES  DICTATION: .Dragon Dictation  PLAN OF CARE: Admit to inpatient   PATIENT DISPOSITION:  PACU - hemodynamically stable.   Delay start of Pharmacological VTE agent (>24hrs) due to surgical blood loss or risk of bleeding: yes

## 2018-02-01 NOTE — Anesthesia Procedure Notes (Signed)
Procedure Name: Intubation Date/Time: 02/01/2018 11:31 AM Performed by: Hart RobinsonsByrd, Andreyah Natividad R, CRNA Pre-anesthesia Checklist: Patient identified, Emergency Drugs available, Suction available and Patient being monitored Patient Re-evaluated:Patient Re-evaluated prior to induction Oxygen Delivery Method: Circle System Utilized Preoxygenation: Pre-oxygenation with 100% oxygen Induction Type: IV induction Ventilation: Mask ventilation without difficulty Laryngoscope Size: Miller and 2 Grade View: Grade I Tube type: Oral Endobronchial tube: Left, EBT position confirmed by fiberoptic bronchoscope and EBT position confirmed by auscultation and 37 Fr Number of attempts: 1 Airway Equipment and Method: Stylet and Oral airway Placement Confirmation: ETT inserted through vocal cords under direct vision,  positive ETCO2 and breath sounds checked- equal and bilateral Secured at: 29 cm Tube secured with: Tape Dental Injury: Teeth and Oropharynx as per pre-operative assessment  Comments: Intubated without difficulty, C-spine neutrality maintained, Dentition unchanged from pre-procedure.

## 2018-02-01 NOTE — Progress Notes (Signed)
Patient ID: Carolyn Huynh, female   DOB: 07/11/1955, 62 y.o.   MRN: [id number] EVENING ROUNDS NOTE :     [address]       [place] [id number]             [phone]                 Day of Surgery Procedure(s) (LRB): VIDEO ASSISTED THORACOSCOPY, BLEB STAPLING (Right)  Total Length of Stay:  LOS: 1 day  BP (!) 148/71   Pulse (!) 55   Temp (!) 97.5 F (36.4 C)   Resp 18   Ht 5\' 4"  (1.626 m)   Wt 70.8 kg   SpO2 100%   BMI 26.78 kg/m   .Intake/Output      08/19 0701 - 08/20 0700 08/20 0701 - 08/21 0700   P.O. 330    I.V. (mL/kg)  1200 (16.9)   Other  100   Total Intake(mL/kg) 330 (4.7) 1300 (18.4)   Urine (mL/kg/hr) 200 (0.1) 1615 (2.2)   Stool 0    Blood  150   Chest Tube 26 75   Total Output 226 1840   Net +104 -540        Urine Occurrence 1 x      .  ceFAZolin (ANCEF) IV    . dextrose 5 % and 0.45 % NaCl with KCl 20 mEq/L    . potassium chloride       Lab Results  Component Value Date   WBC 6.6 02/01/2018   HGB 11.2 (L) 02/01/2018   HCT 35.2 (L) 02/01/2018   PLT 222 02/01/2018   GLUCOSE 166 (H) 01/31/2018   ALT 15 01/23/2018   AST 21 01/23/2018   NA 142 01/31/2018   K 3.2 (L) 01/31/2018   CL 104 01/31/2018   CREATININE 0.80 01/31/2018   BUN 12 01/31/2018   CO2 29 01/31/2018   INR 1.19 02/01/2018    Surgery today  Stable No air leak  Delight OvensEdward B Abdulahad Mederos MD  Beeper [phone] Office [phone] 02/01/2018 5:23 PM

## 2018-02-01 NOTE — Anesthesia Preprocedure Evaluation (Addendum)
Anesthesia Evaluation  Patient identified by MRN, date of birth, ID band Patient awake    Reviewed: Allergy & Precautions, NPO status , Patient's Chart, lab work & pertinent test results  Airway Mallampati: III  TM Distance: >3 FB Neck ROM: Full    Dental  (+) Edentulous Upper   Pulmonary asthma , COPD,  COPD inhaler and oxygen dependent,    Pulmonary exam normal breath sounds clear to auscultation       Cardiovascular hypertension, Normal cardiovascular exam Rhythm:Regular Rate:Normal  ECG: ST, rate 112   Neuro/Psych PSYCHIATRIC DISORDERS Dementia negative neurological ROS     GI/Hepatic negative GI ROS, Neg liver ROS,   Endo/Other  negative endocrine ROS  Renal/GU negative Renal ROS     Musculoskeletal negative musculoskeletal ROS (+)   Abdominal   Peds  Hematology  (+) anemia ,   Anesthesia Other Findings  RECURRENT RIGHT PTX  Reproductive/Obstetrics                            Anesthesia Physical Anesthesia Plan  ASA: IV  Anesthesia Plan: General   Post-op Pain Management:    Induction: Intravenous  PONV Risk Score and Plan: 3 and Ondansetron, Dexamethasone, Midazolam and Treatment may vary due to age or medical condition  Airway Management Planned: Double Lumen EBT  Additional Equipment: Arterial line, CVP and Ultrasound Guidance Line Placement  Intra-op Plan:   Post-operative Plan: Extubation in OR  Informed Consent: I have reviewed the patients History and Physical, chart, labs and discussed the procedure including the risks, benefits and alternatives for the proposed anesthesia with the patient or authorized representative who has indicated his/her understanding and acceptance.   Dental advisory given  Plan Discussed with: CRNA  Anesthesia Plan Comments:       Anesthesia Quick Evaluation

## 2018-02-02 ENCOUNTER — Inpatient Hospital Stay (HOSPITAL_COMMUNITY): Payer: Medicaid - Out of State

## 2018-02-02 ENCOUNTER — Encounter (HOSPITAL_COMMUNITY): Payer: Self-pay | Admitting: Cardiothoracic Surgery

## 2018-02-02 ENCOUNTER — Ambulatory Visit: Payer: Self-pay | Admitting: Surgery

## 2018-02-02 LAB — BASIC METABOLIC PANEL
ANION GAP: 4 — AB (ref 5–15)
BUN: 7 mg/dL — ABNORMAL LOW (ref 8–23)
CHLORIDE: 106 mmol/L (ref 98–111)
CO2: 28 mmol/L (ref 22–32)
Calcium: 8.2 mg/dL — ABNORMAL LOW (ref 8.9–10.3)
Creatinine, Ser: 0.7 mg/dL (ref 0.44–1.00)
Glucose, Bld: 141 mg/dL — ABNORMAL HIGH (ref 70–99)
POTASSIUM: 3.8 mmol/L (ref 3.5–5.1)
SODIUM: 138 mmol/L (ref 135–145)

## 2018-02-02 LAB — POCT I-STAT 3, ART BLOOD GAS (G3+)
Acid-Base Excess: 3 mmol/L — ABNORMAL HIGH (ref 0.0–2.0)
Bicarbonate: 28.2 mmol/L — ABNORMAL HIGH (ref 20.0–28.0)
O2 Saturation: 99 %
Patient temperature: 98.4
TCO2: 30 mmol/L (ref 22–32)
pCO2 arterial: 43.7 mmHg (ref 32.0–48.0)
pH, Arterial: 7.417 (ref 7.350–7.450)
pO2, Arterial: 133 mmHg — ABNORMAL HIGH (ref 83.0–108.0)

## 2018-02-02 LAB — GLUCOSE, CAPILLARY
Glucose-Capillary: 130 mg/dL — ABNORMAL HIGH (ref 70–99)
Glucose-Capillary: 156 mg/dL — ABNORMAL HIGH (ref 70–99)
Glucose-Capillary: 97 mg/dL (ref 70–99)

## 2018-02-02 LAB — CBC
HCT: 35.8 % — ABNORMAL LOW (ref 36.0–46.0)
HEMOGLOBIN: 11.2 g/dL — AB (ref 12.0–15.0)
MCH: 30.1 pg (ref 26.0–34.0)
MCHC: 31.3 g/dL (ref 30.0–36.0)
MCV: 96.2 fL (ref 78.0–100.0)
PLATELETS: 238 10*3/uL (ref 150–400)
RBC: 3.72 MIL/uL — AB (ref 3.87–5.11)
RDW: 12.7 % (ref 11.5–15.5)
WBC: 12.1 10*3/uL — AB (ref 4.0–10.5)

## 2018-02-02 MED ORDER — SODIUM CHLORIDE 0.9% FLUSH
10.0000 mL | Freq: Two times a day (BID) | INTRAVENOUS | Status: DC
  Administered 2018-02-02 – 2018-02-05 (×8): 10 mL

## 2018-02-02 MED ORDER — SODIUM CHLORIDE 0.9% FLUSH: 10.0000 mL | INTRAVENOUS | Status: DC | PRN

## 2018-02-02 MED ORDER — POTASSIUM CHLORIDE CRYS ER 10 MEQ PO TBCR
10.0000 meq | EXTENDED_RELEASE_TABLET | Freq: Every day | ORAL | Status: DC
  Administered 2018-02-02 – 2018-02-06 (×5): 10 meq via ORAL
  Filled 2018-02-02 (×5): qty 1

## 2018-02-02 MED ORDER — METOCLOPRAMIDE HCL 5 MG/ML IJ SOLN
10.0000 mg | Freq: Four times a day (QID) | INTRAMUSCULAR | Status: AC
  Administered 2018-02-02 (×4): 10 mg via INTRAVENOUS
  Filled 2018-02-02 (×4): qty 2

## 2018-02-02 MED ORDER — ONDANSETRON HCL 4 MG/2ML IJ SOLN: 4.0000 mg | Freq: Four times a day (QID) | INTRAMUSCULAR | Status: DC | PRN

## 2018-02-02 MED ORDER — CHLORHEXIDINE GLUCONATE CLOTH 2 % EX PADS
6.0000 | MEDICATED_PAD | Freq: Every day | CUTANEOUS | Status: DC
  Administered 2018-02-02 – 2018-02-04 (×3): 6 via TOPICAL

## 2018-02-02 MED ORDER — HYDROCHLOROTHIAZIDE 12.5 MG PO CAPS
12.5000 mg | ORAL_CAPSULE | Freq: Every day | ORAL | Status: DC
  Administered 2018-02-02 – 2018-02-06 (×5): 12.5 mg via ORAL
  Filled 2018-02-02 (×5): qty 1

## 2018-02-02 NOTE — Plan of Care (Signed)
Pt refused to get up to chair this morning prior to rounds. RN educated pt on need for early ambulation/activity in order to progress. Pt accepting and understood. Pt agreed to get up to chair after breakfast along with getting washed up.  RN will pass this information to oncoming day shift RN.  Problem: Activity: Goal: Risk for activity intolerance will decrease Outcome: Progressing

## 2018-02-02 NOTE — Progress Notes (Addendum)
      [address]       [place] [id number]             [phone]      1 Day Post-Op Procedure(s) (LRB): VIDEO ASSISTED THORACOSCOPY, BLEB STAPLING (Right)   Subjective:  Patient complains of pain 9/10.  She also has some nausea this morning. Objective: Vital signs in last 24 hours: Temp:  [97.5 F (36.4 C)-98.4 F (36.9 C)] 97.5 F (36.4 C) (08/21 0700) Pulse Rate:  [55-85] 76 (08/21 0800) Cardiac Rhythm: Normal sinus rhythm;Sinus bradycardia (08/21 0408) Resp:  [9-31] 20 (08/21 0734) BP: (116-161)/(50-88) 133/55 (08/21 0800) SpO2:  [97 %-100 %] 100 % (08/21 0800) Arterial Line BP: (105-169)/(73-119) 141/89 (08/20 1630)  Intake/Output from previous day: 08/20 0701 - 08/21 0700 In: 2758.4 [I.V.:2418.4; IV Piggyback:240.1] Out: 3652 [Urine:3365; Blood:150; Chest Tube:137]  General appearance: alert, cooperative and no distress Heart: regular rate and rhythm Lungs: clear to auscultation bilaterally Abdomen: soft, non-tender; bowel sounds normal; no masses,  no organomegaly Extremities: extremities normal, atraumatic, no cyanosis or edema Wound: clean and dry  Lab Results: Recent Labs    02/01/18 0519 02/02/18 0500  WBC 6.6 12.1*  HGB 11.2* 11.2*  HCT 35.2* 35.8*  PLT 222 238   BMET:  Recent Labs    01/31/18 0648 02/02/18 0500  NA 142 138  K 3.2* 3.8  CL 104 106  CO2 29 28  GLUCOSE 166* 141*  BUN 12 7*  CREATININE 0.80 0.70  CALCIUM 9.2 8.2*    PT/INR:  Recent Labs    02/01/18 0519  LABPROT 15.0  INR 1.19   ABG    Component Value Date/Time   PHART 7.417 02/02/2018 0502   HCO3 28.2 (H) 02/02/2018 0502   TCO2 30 02/02/2018 0502   O2SAT 99.0 02/02/2018 0502   CBG (last 3)  Recent Labs    02/01/18 1820 02/02/18 0011 02/02/18 0644  GLUCAP 138* 156* 130*    Assessment/Plan: S/P Procedure(s) (LRB): VIDEO ASSISTED THORACOSCOPY, BLEB STAPLING (Right)  1. Chest tube- + air leak, leave chest tube on suction today 2. CV-  NSR, remains hypertensive, will start low dose HCTZ, continue prn Hydralazine 3. Pulm- CXR with trace apical pneumothorax on right, continue IS 4. Renal-creatinine is stable 5. GI- nausea, will start Zofran, Reglan 6. Decrease IV fluids, tolerated oral intake this morning 7. Lovenox for DVT prophylaxis 8. Dispo- patient stable, will [facility], repeat CXR in AM   LOS: 2 days    [name] Barrett 02/02/2018 Patient examined, agree with above assessment and plan. Doing well 1 day after right VATS and resection of apical blebs.  Minimal air leak today.  Chest x-ray satisfactory.  Postoperative pain treated with PCA, tramadol.  Holding Toradol since patient is allergic to aspirin.  Transfer to stepdown 2C Lovett SoxPeter Aren Cherne MD

## 2018-02-02 NOTE — Plan of Care (Addendum)
Pt in bed, states wants to finish dinner before transfer. [facility]. Pt on fentanyl, PCA pump. PRN meds given as ordered. Pt ambulated this shift, [id number], tolerated well. Pt sat in chair as well for 4 and half hours. Chest tube still in place. No new changes at this time.   Transported to new unit without any difficulty at 1730. Vital signs stable throughout.  Foley taken out upon transfer to new unit. Dois DavenportSandra RN, assuming care. No new complaints at this time.    Problem: Health Behavior/Discharge Planning: Goal: Ability to manage health-related needs will improve 02/02/2018 1809 by Araceli BoucheJammeh-Loum, Lejend Dalby, RN Outcome: Progressing 02/02/2018 1809 by Araceli BoucheJammeh-Loum, Wadell Craddock, RN Outcome: Progressing   Problem: Clinical Measurements: Goal: Ability to maintain clinical measurements within normal limits will improve 02/02/2018 1809 by Araceli BoucheJammeh-Loum, Emmily Pellegrin, RN Outcome: Progressing 02/02/2018 1809 by Araceli BoucheJammeh-Loum, Aleda Madl, RN Outcome: Progressing Goal: Will remain free from infection 02/02/2018 1809 by Araceli BoucheJammeh-Loum, Kowen Kluth, RN Outcome: Progressing 02/02/2018 1809 by Araceli BoucheJammeh-Loum, Merl Guardino, RN Outcome: Progressing Goal: Diagnostic test results will improve 02/02/2018 1809 by Araceli BoucheJammeh-Loum, Durante Violett, RN Outcome: Progressing 02/02/2018 1809 by Araceli BoucheJammeh-Loum, Randle Shatzer, RN Outcome: Progressing Goal: Respiratory complications will improve 02/02/2018 1809 by Araceli BoucheJammeh-Loum, Ilea Hilton, RN Outcome: Progressing 02/02/2018 1809 by Araceli BoucheJammeh-Loum, Idil Maslanka, RN Outcome: Progressing Goal: Cardiovascular complication will be avoided 02/02/2018 1809 by Araceli BoucheJammeh-Loum, Deionna Marcantonio, RN Outcome: Progressing 02/02/2018 1809 by Araceli BoucheJammeh-Loum, Namon Villarin, RN Outcome: Progressing   Problem: Activity: Goal: Risk for activity intolerance will decrease 02/02/2018 1809 by Araceli BoucheJammeh-Loum, Aleaya Latona, RN Outcome: Progressing 02/02/2018 1809 by Araceli BoucheJammeh-Loum, Jayah Balthazar, RN Outcome: Progressing   Problem: Nutrition: Goal: Adequate nutrition will be maintained 02/02/2018 1809 by  Araceli BoucheJammeh-Loum, Hedi Barkan, RN Outcome: Progressing 02/02/2018 1809 by Araceli BoucheJammeh-Loum, Herby Amick, RN Outcome: Progressing   Problem: Coping: Goal: Level of anxiety will decrease 02/02/2018 1809 by Araceli BoucheJammeh-Loum, Varnell Donate, RN Outcome: Progressing 02/02/2018 1809 by Araceli BoucheJammeh-Loum, Udell Mazzocco, RN Outcome: Progressing   Problem: Elimination: Goal: Will not experience complications related to bowel motility 02/02/2018 1809 by Araceli BoucheJammeh-Loum, Hadlei Stitt, RN Outcome: Progressing 02/02/2018 1809 by Araceli BoucheJammeh-Loum, Kalista Laguardia, RN Outcome: Progressing Goal: Will not experience complications related to urinary retention 02/02/2018 1809 by Araceli BoucheJammeh-Loum, Emarion Toral, RN Outcome: Progressing 02/02/2018 1809 by Araceli BoucheJammeh-Loum, Margit Batte, RN Outcome: Progressing   Problem: Pain Managment: Goal: General experience of comfort will improve 02/02/2018 1809 by Araceli BoucheJammeh-Loum, Khamiyah Grefe, RN Outcome: Progressing 02/02/2018 1809 by Araceli BoucheJammeh-Loum, Isaiah Cianci, RN Outcome: Progressing   Problem: Safety: Goal: Ability to remain free from injury will improve 02/02/2018 1809 by Araceli BoucheJammeh-Loum, Lucynda Rosano, RN Outcome: Progressing 02/02/2018 1809 by Araceli BoucheJammeh-Loum, Kaniyah Lisby, RN Outcome: Progressing   Problem: Skin Integrity: Goal: Risk for impaired skin integrity will decrease 02/02/2018 1809 by Araceli BoucheJammeh-Loum, Fronnie Urton, RN Outcome: Progressing 02/02/2018 1809 by Araceli BoucheJammeh-Loum, Mahreen Schewe, RN Outcome: Progressing

## 2018-02-02 NOTE — Plan of Care (Signed)

## 2018-02-02 NOTE — Anesthesia Postprocedure Evaluation (Signed)
Anesthesia Post Note  Patient: Carolyn Huynh  Procedure(s) Performed: VIDEO ASSISTED THORACOSCOPY, BLEB STAPLING (Right Chest)     Patient location during evaluation: PACU Anesthesia Type: General Level of consciousness: awake Pain management: pain level controlled Vital Signs Assessment: post-procedure vital signs reviewed and stable Respiratory status: spontaneous breathing, nonlabored ventilation, respiratory function stable and patient connected to nasal cannula oxygen Cardiovascular status: blood pressure returned to baseline and stable Postop Assessment: no apparent nausea or vomiting Anesthetic complications: no    Last Vitals:  Vitals:   02/02/18 0700 02/02/18 0734  BP: (!) 121/50   Pulse: 63   Resp: (!) 23 20  Temp: (!) 36.4 C   SpO2: 100% 100%    Last Pain:  Vitals:   02/02/18 0734  TempSrc:   PainSc: 0-No pain                 Ryan P Ellender

## 2018-02-02 NOTE — Discharge Summary (Addendum)
Physician Discharge Summary  Patient ID: Carolyn RiceDorothy A Huynh MRN: [id number] DOB/AGE: 64/11/1955 64 y.o.  Admit date: 01/31/2018 Discharge date: 02/06/2018  Admission Diagnoses:  Patient Active Problem List   Diagnosis Date Noted  . Pneumothorax, right 02/01/2018  . 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 Pneumothorax on right 01/22/2018  . Pneumothorax 01/15/2018   Discharge Diagnoses:   Patient Active Problem List   Diagnosis Date Noted   S/P Right VATS with Resection of Blebs, Pleurodesis   . Pneumothorax, right 02/01/2018  . 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 Pneumothorax on right 01/22/2018  . Pneumothorax 01/15/2018   Discharged Condition: good  History of Present Illness:  Mrs. Carolyn Huynh is a 62 yo AA female well known to [facility].  She has a history of pneumothorax with surgery to left chest back in 2000, however patient does not remember much of this hospitalization.  She has been admitted several times over the past 2 weeks for spontaneous pneumothorax.  Initially on 01/15/2018, with chest tube placement and resolution of pneumothorax. She was again admitted on 01/22/18 for recurrent pneumothorax which again required chest tube placement.  At that time it was felt patient should undergo Right VATS with bleb resection and pleurodesis. However, she refused to have the procedure.  Her chest tube fell out during that hospitalization with CXR showing a < 5% residual pneumothorax.  She was discharged home on 01/27/2018.  She again presented to the ED early this morning with complaints of shortness of breath with decreased saturations.  She was treated with nebulizers enroute to ED.  CXR was obtained and showed a large right sided pneumothorax.  She underwent a right sided chest tube placement and [facility] was consulted.  Dr. Donata ClayVan Huynh stated the patient would require a VATs procedure and suggested the patient be [facility].  Currently the  patient complains of a headache.  She also has some pain at her chest tube site.    She originally stated she would proceed with surgery.  However on evaluation of the patient she states she wishes to speak with her family prior to making a final decision.  She was again counseled that she would likely continue to have recurrent pneumthoraces without surgical intervention.  She states she is aware of the risk.    Hospital Course:   [facility] the patient remained clinically stable.  She remained agreeable to proceed with VATS procedure.  She was taken to the operating room on 02/01/2018.  She underwent Right VATS with stapling of apical blebs x 2 and mechanical pleurodesis.  She tolerated the procedure without difficulty, was extubated and taken to the SICU in stable condition.  The patient remained clinically stable post operatively.  Her arterial line was removed without difficulty.  Her chest tube initially had an air leak and remained on suction.  CXR showed a trace apical pneumothorax.  She was nauseated and treated with zofran and reglan with relief of symptoms.  The patient was hypertensive and started on low dose HCTZ.  She was medically stable for transfer to the telemetry unit on 02/02/2018.  She continued to make progress.  Her blood pressure was improved with use of HCTZ.  Her chest x-ray remained stable with a tiny apical pneumothorax.  Her chest tube advanced out of the chest.  She remained free from air leak and her chest tube was removed on 02/04/2018.  Follow up CXR showed stable appearance of right hydropneumothorax.  The patient has been weaned off oxygen.  She is ambulating independently.  Her pain is well controlled.  She is medically stable for discharge home today.  Significant Diagnostic Studies: CT SCAN  1. Approximately 5% residual right pneumothorax. 2. Right chest tube and right lower lobe atelectasis. 3. Small amount of patchy and calcific density in the  medial and anterior aspects of the left upper lobe. This may represent postsurgical changes and scarring. 4. Sub-centimeter thyroid nodule(s) noted, too small to characterize, but most likely benign in the absence of known clinical risk factors for thyroid carcinoma. 5. Centrilobular emphysema. 6. Mild calcific aortic atherosclerosis 7. Dilated intrahepatic ducts. This could be normal if the patient has had a previous cholecystectomy. If not, this would be suspicious for biliary obstruction.  Treatments: surgery:   1.  Right VATS (video-assisted thoracoscopic surgery) with resection of right apical blebs. 2.  Mechanical right pleurodesis.  Discharge Exam: Blood pressure 107/85, pulse 76, temperature 97.9 F (36.6 C), temperature source Oral, resp. rate 20, height 5\' 4"  (1.626 m), weight 70.8 kg, SpO2 99 %.  General appearance: alert, cooperative and no distress Heart: regular rate and rhythm Lungs: clear to auscultation bilaterally Abdomen: soft, non-tender; bowel sounds normal; no masses,  no organomegaly Extremities: extremities normal, atraumatic, no cyanosis or edema Wound: clean and dry  Disposition: Home  Discharge Medications:   Allergies as of 02/06/2018      Reactions   Aspirin Shortness Of Breath      Medication List    STOP taking these medications   HYDROcodone-acetaminophen 5-325 MG tablet Commonly known as:  NORCO/VICODIN     TAKE these medications   acetaminophen 325 MG tablet Commonly known as:  TYLENOL Take 2 tablets (650 mg total) by mouth every 6 (six) hours as needed for mild pain (or Fever >/= 101).   albuterol 108 (90 Base) MCG/ACT inhaler Commonly known as:  PROVENTIL HFA;VENTOLIN HFA Inhale 1-2 puffs into the lungs every 6 (six) hours as needed for wheezing or shortness of breath.   hydrochlorothiazide 12.5 MG capsule Commonly known as:  MICROZIDE Take 1 capsule (12.5 mg total) by mouth daily.   ondansetron 4 MG tablet Commonly known  as:  ZOFRAN Take 1 tablet (4 mg total) by mouth every 8 (eight) hours as needed for nausea or vomiting.   oxyCODONE 5 MG immediate release tablet Commonly known as:  Oxy IR/ROXICODONE Take 1 tablet (5 mg total) by mouth every 4 (four) hours as needed for severe pain.   potassium chloride 10 MEQ tablet Commonly known as:  K-DUR,KLOR-CON Take 1 tablet (10 mEq total) by mouth daily.      Follow-up Information    [facility] Follow up on 02/21/2018.   Specialty:  Cardiothoracic Surgery Why:  Appointment is at 3:00, please get CXR at 2:30 at [facility] located on first floor of our office building Contact information: [address], [address] [place] [id number] [phone]       Primary Care Physician Follow up.   Why:  Please establish care with a family medicine doctor, for further management of hypertension          Signed: [name] Barrett 02/06/2018, 10:00 AM  patient examined and medical record reviewed,agree with above note. Kathlee Nationseter Van Huynh III 02/17/2018

## 2018-02-03 ENCOUNTER — Inpatient Hospital Stay (HOSPITAL_COMMUNITY): Payer: Medicaid - Out of State

## 2018-02-03 LAB — CBC
HEMATOCRIT: 34.3 % — AB (ref 36.0–46.0)
Hemoglobin: 10.8 g/dL — ABNORMAL LOW (ref 12.0–15.0)
MCH: 30.3 pg (ref 26.0–34.0)
MCHC: 31.5 g/dL (ref 30.0–36.0)
MCV: 96.3 fL (ref 78.0–100.0)
PLATELETS: 220 10*3/uL (ref 150–400)
RBC: 3.56 MIL/uL — AB (ref 3.87–5.11)
RDW: 13 % (ref 11.5–15.5)
WBC: 11.6 10*3/uL — AB (ref 4.0–10.5)

## 2018-02-03 LAB — COMPREHENSIVE METABOLIC PANEL
ALT: 22 U/L (ref 0–44)
AST: 24 U/L (ref 15–41)
Albumin: 2.6 g/dL — ABNORMAL LOW (ref 3.5–5.0)
Alkaline Phosphatase: 75 U/L (ref 38–126)
Anion gap: 5 (ref 5–15)
BUN: 6 mg/dL — AB (ref 8–23)
CHLORIDE: 105 mmol/L (ref 98–111)
CO2: 31 mmol/L (ref 22–32)
CREATININE: 0.7 mg/dL (ref 0.44–1.00)
Calcium: 8.4 mg/dL — ABNORMAL LOW (ref 8.9–10.3)
GFR calc Af Amer: >60 mL/min (ref >60)
Glucose, Bld: 103 mg/dL — ABNORMAL HIGH (ref 70–99)
Potassium: 3.5 mmol/L (ref 3.5–5.1)
Sodium: 141 mmol/L (ref 135–145)
Total Bilirubin: 0.6 mg/dL (ref 0.3–1.2)
Total Protein: 6 g/dL — ABNORMAL LOW (ref 6.5–8.1)

## 2018-02-03 NOTE — Progress Notes (Addendum)
patient examined and medical record reviewed,agree with above note. Carolyn Huynh 02/04/2018       [address]       [place] [id number]             [phone]      2 Days Post-Op Procedure(s) (LRB): VIDEO ASSISTED THORACOSCOPY, BLEB STAPLING (Right)   Subjective:  Feels a little more short of breath this morning.  Pain is a little better.  Objective: Vital signs in last 24 hours: Temp:  [97.5 F (36.4 C)-98.7 F (37.1 C)] 98 F (36.7 C) (08/22 0355) Pulse Rate:  [54-121] 121 (08/21 1930) Cardiac Rhythm: Normal sinus rhythm (08/21 2000) Resp:  [17-27] 18 (08/22 0402) BP: (98-164)/(50-84) 126/76 (08/22 0355) SpO2:  [95 %-100 %] 100 % (08/22 0402)  Intake/Output from previous day: 08/21 0701 - 08/22 0700 In: 808.8 [P.O.:240; I.V.:568.8] Out: 1524 [Urine:1370; Chest Tube:154]  General appearance: alert, cooperative and no distress Heart: regular rate and rhythm Lungs: clear to auscultation bilaterally Abdomen: soft, non-tender; bowel sounds normal; no masses,  no organomegaly Extremities: extremities normal, atraumatic, no cyanosis or edema Wound: clean and dry,   Lab Results: Recent Labs    02/02/18 0500 02/03/18 0407  WBC 12.1* 11.6*  HGB 11.2* 10.8*  HCT 35.8* 34.3*  PLT 238 220   BMET:  Recent Labs    02/02/18 0500 02/03/18 0407  NA 138 141  K 3.8 3.5  CL 106 105  CO2 28 31  GLUCOSE 141* 103*  BUN 7* 6*  CREATININE 0.70 0.70  CALCIUM 8.2* 8.4*    PT/INR:  Recent Labs    02/01/18 0519  LABPROT 15.0  INR 1.19   ABG    Component Value Date/Time   PHART 7.417 02/02/2018 0502   HCO3 28.2 (H) 02/02/2018 0502   TCO2 30 02/02/2018 0502   O2SAT 99.0 02/02/2018 0502   CBG (last 3)  Recent Labs    02/02/18 0011 02/02/18 0644 02/02/18 1109  GLUCAP 156* 130* 97    Assessment/Plan: S/P Procedure(s) (LRB): VIDEO ASSISTED THORACOSCOPY, BLEB STAPLING (Right)  1. Chest tube- no air leak present, however CXR shows  increase in pneumothorax on right this morning, will leave on suction 2. CV- NSR, HTN improved, continue low dose HCTZ 3. Pulm- no acute issues, weaning oxygen as tolerated, good use of IS... Increase in pneumothorax, CT remains in place on suction 4. IV fluids to KVO 5. Lovenox for DVT 6. Dispo- patient stable, air leak resolved, however increase in pneumothorax on CXR, will eave chest tube on suction today   LOS: 3 days    Carolyn Huynh 02/03/2018  Path shows no malignancy, apical blebs patient  and medical record reviewed,agree with above note. Carolyn Huynh 02/04/2018

## 2018-02-03 NOTE — Progress Notes (Signed)
Refused to walk around claiming she still have pain. Encouraged to use fentanyl pca. Explained  the importance of ambulation.

## 2018-02-03 NOTE — Care Management Note (Signed)
Case Management Note  Patient Details  Name: Carolyn Huynh MRN: [id number] Date of Birth: 05/25/1956  Subjective/Objective:     Pt readmitted with pnemothrorax   - pt is now s/p VATs (recommended on last admit however pt refused at that time).            Action/Plan:PTA pt lived at home- pt [facility]  [facility].  [facility] - CM will follow   Expected Discharge Date:  (unknown)               Expected Discharge Plan:  Home w Home Health Services  In-House Referral:     Discharge planning Services  CM Consult  Post Acute Care Choice:    Choice offered to:     DME Arranged:    DME Agency:     HH Arranged:    HH Agency:     Status of Service:     If discussed at [employer] Meetings, dates discussed:    Additional Comments:  Cherylann ParrClaxton, Rochell Mabie S, RN 02/03/2018, 3:37 PM

## 2018-02-04 ENCOUNTER — Inpatient Hospital Stay (HOSPITAL_COMMUNITY): Payer: Medicaid - Out of State

## 2018-02-04 LAB — BPAM RBC
Blood Product Expiration Date: 201908272359
Unit Type and Rh: 600

## 2018-02-04 LAB — TYPE AND SCREEN
ABO/RH(D): A POS
Antibody Screen: NEGATIVE
Unit division: 0

## 2018-02-04 MED ORDER — FENTANYL 40 MCG/ML IV SOLN: INTRAVENOUS | Status: AC

## 2018-02-04 NOTE — Progress Notes (Signed)
PA took  off suction from chest tube  and clamped the tube,  Not in any distress continue to monitor.

## 2018-02-04 NOTE — Progress Notes (Signed)
Fentanyl PCA 17 ml wasted in the sink as witnessed by another Charity fundraiserN .

## 2018-02-04 NOTE — Progress Notes (Addendum)
      [address]       [place], [id number]             [phone]       3 Days Post-Op Procedure(s) (LRB): VIDEO ASSISTED THORACOSCOPY, BLEB STAPLING (Right)   Subjective:  No new complaints.  Continues to have pain.   Objective: Vital signs in last 24 hours: Temp:  [97.8 F (36.6 C)-98.5 F (36.9 C)] 97.8 F (36.6 C) (08/23 0750) Pulse Rate:  [68-80] 80 (08/22 1121) Cardiac Rhythm: Normal sinus rhythm (08/23 0739) Resp:  [12-28] 15 (08/23 0750) BP: (129-155)/(61-71) 155/71 (08/23 0750) SpO2:  [93 %-100 %] 100 % (08/23 0357) FiO2 (%):  [0 %-100 %] 0 % (08/22 1740)  Intake/Output from previous day: 08/22 0701 - 08/23 0700 In: 514 [P.O.:220; I.V.:294] Out: 2730 [Urine:2350; Chest Tube:380]  General appearance: alert, cooperative and no distress Heart: regular rate and rhythm Lungs: clear to auscultation bilaterally Abdomen: soft, non-tender; bowel sounds normal; no masses,  no organomegaly Extremities: extremities normal, atraumatic, no cyanosis or edema Wound: clean and dry  Lab Results: Recent Labs    02/02/18 0500 02/03/18 0407  WBC 12.1* 11.6*  HGB 11.2* 10.8*  HCT 35.8* 34.3*  PLT 238 220   BMET:  Recent Labs    02/02/18 0500 02/03/18 0407  NA 138 141  K 3.8 3.5  CL 106 105  CO2 28 31  GLUCOSE 141* 103*  BUN 7* 6*  CREATININE 0.70 0.70  CALCIUM 8.2* 8.4*    PT/INR: No results for input(s): LABPROT, INR in the last 72 hours. ABG    Component Value Date/Time   PHART 7.417 02/02/2018 0502   HCO3 28.2 (H) 02/02/2018 0502   TCO2 30 02/02/2018 0502   O2SAT 99.0 02/02/2018 0502   CBG (last 3)  Recent Labs    02/02/18 0011 02/02/18 0644 02/02/18 1109  GLUCAP 156* 130* 97    Assessment/Plan: S/P Procedure(s) (LRB): VIDEO ASSISTED THORACOSCOPY, BLEB STAPLING (Right)  1. Chest tube- no air leak, however chest tube is out of the chest----will speak about removing chest tube today 2. CV- NSR, + HTN- continue low dose  HCTZ, will require close follow up with PCP 3. Pulm- CXR with stable pneumothorax, + atelectasis, continue IS, possibly d/c chest tube today 4. Continue Lovenox 5. Dispo- patient stable, apical pneumothorax stable, however chest tube is not fully in chest, will discuss removal with Dr. Dorris FetchHendrickson   LOS: 4 days    [name] Barrett 02/04/2018  Chest x-ray with tiny apical space No air leak from chest tube after brief clamp trial for approximately 2 hours We will remove chest tube today, check PA lateral chest x-ray in a.m.  patient examined and medical record reviewed,agree with above note. Kathlee Nationseter Van Trigt III 02/04/2018

## 2018-02-05 ENCOUNTER — Inpatient Hospital Stay (HOSPITAL_COMMUNITY): Payer: Medicaid - Out of State

## 2018-02-05 NOTE — Progress Notes (Signed)
      [address]       [place] [id number]             [phone]      4 Days Post-Op Procedure(s) (LRB): VIDEO ASSISTED THORACOSCOPY, BLEB STAPLING (Right)   Subjective:  Continues to have pain.  Some intermittent shortness of breath at times.  Objective: Vital signs in last 24 hours: Temp:  [97.6 F (36.4 C)-98.4 F (36.9 C)] 97.7 F (36.5 C) (08/24 0823) Pulse Rate:  [87] 87 (08/23 1558) Cardiac Rhythm: Sinus bradycardia (08/24 0756) Resp:  [15-29] 17 (08/24 0823) BP: (133-153)/(65-73) 143/73 (08/24 0823) SpO2:  [100 %] 100 % (08/24 0823) FiO2 (%):  [0 %] 0 % (08/23 1303)  Intake/Output from previous day: 08/23 0701 - 08/24 0700 In: 939.9 [P.O.:720; I.V.:219.9] Out: 1200 [Urine:1200]  General appearance: alert, cooperative and no distress Heart: regular rate and rhythm Lungs: clear to auscultation bilaterally Abdomen: soft, non-tender; bowel sounds normal; no masses,  no organomegaly Extremities: extremities normal, atraumatic, no cyanosis or edema Wound: clean and dry  Lab Results: Recent Labs    02/03/18 0407  WBC 11.6*  HGB 10.8*  HCT 34.3*  PLT 220   BMET:  Recent Labs    02/03/18 0407  NA 141  K 3.5  CL 105  CO2 31  GLUCOSE 103*  BUN 6*  CREATININE 0.70  CALCIUM 8.4*    PT/INR: No results for input(s): LABPROT, INR in the last 72 hours. ABG    Component Value Date/Time   PHART 7.417 02/02/2018 0502   HCO3 28.2 (H) 02/02/2018 0502   TCO2 30 02/02/2018 0502   O2SAT 99.0 02/02/2018 0502   CBG (last 3)  Recent Labs    02/02/18 1109  GLUCAP 97    Assessment/Plan: S/P Procedure(s) (LRB): VIDEO ASSISTED THORACOSCOPY, BLEB STAPLING (Right)  1. CV- hemodynamically stable, continue HCTZ for BP control 2. Pulm- chest tube removed yesterday, stable appearance of apical space, small right pleural effusion, wean oxygen as tolerated 3. Dispo- patient stable, states her family wont be able to get her today, if she remains  clinically stable will plan for d/c in AM   LOS: 5 days    Carolyn Huynh 02/05/2018

## 2018-02-06 MED ORDER — ONDANSETRON HCL 4 MG PO TABS: 4.0000 mg | ORAL_TABLET | Freq: Three times a day (TID) | ORAL | 0 refills | Status: DC | PRN

## 2018-02-06 MED ORDER — POTASSIUM CHLORIDE CRYS ER 10 MEQ PO TBCR: 10.0000 meq | EXTENDED_RELEASE_TABLET | Freq: Every day | ORAL | 3 refills | Status: DC

## 2018-02-06 MED ORDER — OXYCODONE HCL 5 MG PO TABS: 5.0000 mg | ORAL_TABLET | ORAL | 0 refills | Status: DC | PRN

## 2018-02-06 MED ORDER — HYDROCHLOROTHIAZIDE 12.5 MG PO CAPS: 12.5000 mg | ORAL_CAPSULE | Freq: Every day | ORAL | 3 refills | Status: DC

## 2018-02-06 NOTE — Progress Notes (Addendum)
      [address]       [place], [id number]             [phone]      5 Days Post-Op Procedure(s) (LRB): VIDEO ASSISTED THORACOSCOPY, BLEB STAPLING (Right)   Subjective:  No new complaints.  Continues to have expected incisional soreness.  She does request a prescription for nausea medicine at discharge.    Objective: Vital signs in last 24 hours: Temp:  [97.8 F (36.6 C)-98.1 F (36.7 C)] 97.9 F (36.6 C) (08/25 0801) Pulse Rate:  [72-76] 76 (08/25 0300) Cardiac Rhythm: Normal sinus rhythm (08/25 0745) Resp:  [15-24] 20 (08/25 0801) BP: (107-142)/(60-85) 107/85 (08/25 0801) SpO2:  [94 %-100 %] 99 % (08/25 0300)  Intake/Output from previous day: 08/24 0701 - 08/25 0700 In: 87.3 [I.V.:87.3] Out: 1250 [Urine:1250] Intake/Output this shift: Total I/O In: -  Out: 200 [Urine:200]  General appearance: alert, cooperative and no distress Heart: regular rate and rhythm Lungs: clear to auscultation bilaterally Abdomen: soft, non-tender; bowel sounds normal; no masses,  no organomegaly Extremities: extremities normal, atraumatic, no cyanosis or edema Wound: clean and dry  Lab Results: No results for input(s): WBC, HGB, HCT, PLT in the last 72 hours. BMET: No results for input(s): NA, K, CL, CO2, GLUCOSE, BUN, CREATININE, CALCIUM in the last 72 hours.  PT/INR: No results for input(s): LABPROT, INR in the last 72 hours. ABG    Component Value Date/Time   PHART 7.417 02/02/2018 0502   HCO3 28.2 (H) 02/02/2018 0502   TCO2 30 02/02/2018 0502   O2SAT 99.0 02/02/2018 0502   CBG (last 3)  No results for input(s): GLUCAP in the last 72 hours.  Assessment/Plan: S/P Procedure(s) (LRB): VIDEO ASSISTED THORACOSCOPY, BLEB STAPLING (Right)  1. CV- hemodynamically stable, good BP control with HCTZ- will need to follow up with PCP 2.  Pulm- off oxygen, instructed for continued use of IS at discharge 3. Gi- nausea at times, requests prescription for discharge,  will provide 4. Dispo- patient stable, will d/c home today   LOS: 6 days    Carolyn Huynh 02/06/2018 Patient seen and examined, agree with above  Viviann SpareSteven C. Dorris FetchHendrickson, MD [facility] [phone]

## 2018-02-06 NOTE — Progress Notes (Signed)
Discharge instructions reviewed with pt, pt had no questions at this time. IV removed and cardiac monitor removed pt awaiting ride home.

## 2018-02-06 NOTE — Discharge Instructions (Signed)
Discharge Instructions:  1. You may shower, please wash incisions daily with soap and water and keep dry.  If you wish to cover wounds with dressing you may do so but please keep clean and change daily.  No tub baths or swimming until incisions have completely healed.  If your incisions become red or develop any drainage please call our office at [phone]  2. No Driving until cleared by Dr. Zenaida NieceVan Trigt's office and you are no longer using narcotic pain medications  3. Fever of 101.5 for at least 24 hours with no source, please contact our office at [phone]  4. Activity- up as tolerated, please walk at least 3 times per day.  Avoid strenuous activity for several weeks  6. If any questions or concerns arise, please do not hesitate to contact our office at [phone]    Pneumothorax A pneumothorax, commonly called a collapsed lung, is a condition in which air leaks from a lung and builds up in the space between the lung and the chest wall (pleural space). The air in a pneumothorax is trapped outside the lung and takes up space, preventing the lung from fully expanding. This is a condition that usually occurs suddenly. The buildup of air may be small or large. A small pneumothorax may go away on its own. When a pneumothorax is larger, it will often require medical treatment and hospitalization. What are the causes? A pneumothorax can sometimes happen quickly with no apparent cause. People with underlying lung problems, particularly COPD or emphysema, are at higher risk of pneumothorax. However, pneumothorax can happen quickly even in people with no prior known lung problems. Trauma, surgery, medical procedures, or injury to the chest wall can also cause a pneumothorax. What are the signs or symptoms? Sometimes a pneumothorax will have no symptoms. When symptoms are present, they can include:  Chest pain.  Shortness of breath.  Increased rate of breathing.  Bluish color to your lips or  skin (cyanosis).  How is this diagnosed? Pneumothorax is usually diagnosed by a chest X-ray or chest CT scan. Your health care provider will also take a medical history and perform a physical exam to determine why you may have a pneumothorax. How is this treated? A small pneumothorax may go away on its own without treatment. Extra oxygen can sometimes help a small pneumothorax go away more quickly. For a larger pneumothorax or a pneumothorax that is causing symptoms, a procedure is usually needed to drain the air.In some cases, the health care provider may drain the air using a needle. In other cases, a chest tube may be inserted into the pleural space. A chest tube is a small tube placed between the ribs and into the pleural space. This removes the extra air and allows the lung to expand back to its normal size. A large pneumothorax will usually require a hospital stay. If there is ongoing air leakage into the pleural space, then the chest tube may need to remain in place for several days until the air leak has healed. In some cases, surgery may be needed. Follow these instructions at home:  Only take over-the-counter or prescription medicines as directed by your health care provider.  If a cough or pain makes it difficult for you to sleep at night, try sleeping in a semi-upright position in a recliner or by using 2 or 3 pillows.  Rest and limit activity as directed by your health care provider.  If you had a chest tube and it was  removed, ask your health care provider when it is okay to remove the dressing. Until your health care provider says you can remove the dressing, do not allow it to get wet.  Do not smoke. Smoking is a risk factor for pneumothorax.  Do not fly in an airplane or scuba dive until your health care provider says it is okay.  Follow up with your health care provider as directed. Get help right away if:  You have increasing chest pain or shortness of breath.  You have a  cough that is not controlled with suppressants.  You begin coughing up blood.  You have pain that is getting worse or is not controlled with medicines.  You cough up thick, discolored mucus (sputum) that is yellow to green in color.  You have redness, increasing pain, or discharge at the site where a chest tube had been in place (if your pneumothorax was treated with a chest tube).  The site where your chest tube was located opens up.  You feel air coming out of the site where the chest tube was placed.  You have a fever or persistent symptoms for more than 2-3 days.  You have a fever and your symptoms suddenly get worse. This information is not intended to replace advice given to you by your health care provider. Make sure you discuss any questions you have with your health care provider. Document Released: 06/01/2005 Document Revised: 11/07/2015 Document Reviewed: 10/25/2013 Elsevier Interactive Patient Education  2018 [employer].   Hypertension Hypertension is another name for high blood pressure. High blood pressure forces your heart to work harder to pump blood. This can cause problems over time. There are two numbers in a blood pressure reading. There is a top number (systolic) over a bottom number (diastolic). It is best to have a blood pressure below 120/80. Healthy choices can help lower your blood pressure. You may need medicine to help lower your blood pressure if:  Your blood pressure cannot be lowered with healthy choices.  Your blood pressure is higher than 130/80.  Follow these instructions at home: Eating and drinking  If directed, follow the DASH eating plan. This diet includes: ? Filling half of your plate at each meal with fruits and vegetables. ? Filling one quarter of your plate at each meal with whole grains. Whole grains include whole wheat pasta, brown rice, and whole grain bread. ? Eating or drinking low-fat dairy products, such as skim milk or low-fat  yogurt. ? Filling one quarter of your plate at each meal with low-fat (lean) proteins. Low-fat proteins include fish, skinless chicken, eggs, beans, and tofu. ? Avoiding fatty meat, cured and processed meat, or chicken with skin. ? Avoiding premade or processed food.  Eat less than 1,500 mg of salt (sodium) a day.  Limit alcohol use to no more than 1 drink a day for nonpregnant women and 2 drinks a day for men. One drink equals 12 oz of beer, 5 oz of wine, or 1 oz of hard liquor. Lifestyle  Work with your doctor to stay at a healthy weight or to lose weight. Ask your doctor what the best weight is for you.  Get at least 30 minutes of exercise that causes your heart to beat faster (aerobic exercise) most days of the week. This may include walking, swimming, or biking.  Get at least 30 minutes of exercise that strengthens your muscles (resistance exercise) at least 3 days a week. This may include lifting weights or  pilates.  Do not use any products that contain nicotine or tobacco. This includes cigarettes and e-cigarettes. If you need help quitting, ask your doctor.  Check your blood pressure at home as told by your doctor.  Keep all follow-up visits as told by your doctor. This is important. Medicines  Take over-the-counter and prescription medicines only as told by your doctor. Follow directions carefully.  Do not skip doses of blood pressure medicine. The medicine does not work as well if you skip doses. Skipping doses also puts you at risk for problems.  Ask your doctor about side effects or reactions to medicines that you should watch for. Contact a doctor if:  You think you are having a reaction to the medicine you are taking.  You have headaches that keep coming back (recurring).  You feel dizzy.  You have swelling in your ankles.  You have trouble with your vision. Get help right away if:  You get a very bad headache.  You start to feel confused.  You feel weak or  numb.  You feel faint.  You get very bad pain in your: ? Chest. ? Belly (abdomen).  You throw up (vomit) more than once.  You have trouble breathing. Summary  Hypertension is another name for high blood pressure.  Making healthy choices can help lower blood pressure. If your blood pressure cannot be controlled with healthy choices, you may need to take medicine. This information is not intended to replace advice given to you by your health care provider. Make sure you discuss any questions you have with your health care provider. Document Released: 11/18/2007 Document Revised: 04/29/2016 Document Reviewed: 04/29/2016 Elsevier Interactive Patient Education  [employer].

## 2018-02-07 ENCOUNTER — Ambulatory Visit: Payer: Medicaid - Out of State

## 2018-02-18 ENCOUNTER — Other Ambulatory Visit: Payer: Self-pay | Admitting: Cardiothoracic Surgery

## 2018-02-18 DIAGNOSIS — J939 Pneumothorax, unspecified: Secondary | ICD-10-CM

## 2018-02-21 ENCOUNTER — Ambulatory Visit (INDEPENDENT_AMBULATORY_CARE_PROVIDER_SITE_OTHER): Payer: Self-pay | Admitting: Physician Assistant

## 2018-02-21 ENCOUNTER — Ambulatory Visit
Admission: RE | Admit: 2018-02-21 | Discharge: 2018-02-21 | Disposition: A | Discharge status: Home / Self Care | Payer: PRIVATE HEALTH INSURANCE | Source: Ambulatory Visit | Attending: Cardiothoracic Surgery | Admitting: Cardiothoracic Surgery

## 2018-02-21 VITALS — BP 165/96 | HR 85 | Resp 20 | Ht 64.0 in | Wt 160.0 lb

## 2018-02-21 DIAGNOSIS — J939 Pneumothorax, unspecified: Secondary | ICD-10-CM

## 2018-02-21 DIAGNOSIS — Z09 Encounter for follow-up examination after completed treatment for conditions other than malignant neoplasm: Secondary | ICD-10-CM

## 2018-02-21 MED ORDER — OXYCODONE HCL 5 MG PO TABS: 5.0000 mg | ORAL_TABLET | Freq: Four times a day (QID) | ORAL | 0 refills | Status: DC | PRN

## 2018-02-21 MED ORDER — ALBUTEROL SULFATE HFA 108 (90 BASE) MCG/ACT IN AERS: 1.0000 | INHALATION_SPRAY | Freq: Four times a day (QID) | RESPIRATORY_TRACT | 3 refills | Status: DC | PRN

## 2018-02-21 NOTE — Progress Notes (Signed)
HPI: Patient returns for routine postoperative follow-up having undergone Right VATs on 02/01/2018. The patient's early postoperative recovery while in the hospital was notable for Hypertension and was started on low dose HCTZ for control of that. Since hospital discharge the patient reports that she continues to have a lot of pain and is requesting a refill on her oxycodone.  She is hard to get any other information out of, than her request for pain medication.  She has not changed her dressing or washed her incisions since her hospitalization.  She also admits to a cough with some production of sputum that is yellow in color with occasional blood at times.  She states she is mostly staying in the house.  She is ambulating minimal distance, but states that she requires assistance.  States that she just isn't strong enough to walk on her own.  She is requesting a referral to PT.  Current Outpatient Medications  Medication Sig Dispense Refill  . acetaminophen (TYLENOL) 325 MG tablet Take 2 tablets (650 mg total) by mouth every 6 (six) hours as needed for mild pain (or Fever >/= 101).    . hydrochlorothiazide (MICROZIDE) 12.5 MG capsule Take 1 capsule (12.5 mg total) by mouth daily. 30 capsule 3  . ondansetron (ZOFRAN) 4 MG tablet Take 1 tablet (4 mg total) by mouth every 8 (eight) hours as needed for nausea or vomiting. 20 tablet 0  . potassium chloride (K-DUR,KLOR-CON) 10 MEQ tablet Take 1 tablet (10 mEq total) by mouth daily. 30 tablet 3  . albuterol (PROVENTIL HFA;VENTOLIN HFA) 108 (90 Base) MCG/ACT inhaler Inhale 1-2 puffs into the lungs every 6 (six) hours as needed for wheezing or shortness of breath.    . oxyCODONE (OXY IR/ROXICODONE) 5 MG immediate release tablet Take 1 tablet (5 mg total) by mouth every 4 (four) hours as needed for severe pain. (Patient not taking: Reported on 02/21/2018) 30 tablet 0   No current facility-administered medications for this visit.     Physical Exam:  BP (!)  165/96   Pulse 85   Resp 20   Ht 5\' 4"  (1.626 m)   Wt 160 lb (72.6 kg)   SpO2 95% Comment: RA  BMI 27.46 kg/m    Gen: no apparent distress Heart: RRR Lungs: CTA bilaterally Incisions: well healed, no acute signs of infection  Diagnostic Tests:  CXR: no pneumothorax, significant pleural effusion  A/P:  1. S/p Right VATs with resection of Apical Blebs 2. Pain- patient with persistent pain, requesting refill on narcotic pain medication, encouraged her to use Tylenol for minor pain, and save Oxy 5 mg every 6 hours prn pain, 20 tablets given as she will not be provided with additional pain medication 3. Refer to PT per request for exercises to help patient get strength and endurance back 4. Refer to family medicine to establish primary care, she remains hypertensive at today's visit, will continue HCTZ and potassium supplementation. 5. RTC prn  Lowella Dandy, PA-C [facility] [phone]

## 2018-08-25 ENCOUNTER — Emergency Department (HOSPITAL_COMMUNITY): Payer: PRIVATE HEALTH INSURANCE

## 2018-08-25 ENCOUNTER — Encounter (HOSPITAL_COMMUNITY): Payer: Self-pay | Admitting: Emergency Medicine

## 2018-08-25 ENCOUNTER — Emergency Department (HOSPITAL_COMMUNITY)
Admission: EM | Admit: 2018-08-25 | Discharge: 2018-08-25 | Disposition: A | Discharge status: Home / Self Care | Payer: PRIVATE HEALTH INSURANCE | Attending: Emergency Medicine | Admitting: Emergency Medicine

## 2018-08-25 DIAGNOSIS — I10 Essential (primary) hypertension: Secondary | ICD-10-CM | POA: Insufficient documentation

## 2018-08-25 DIAGNOSIS — K529 Noninfective gastroenteritis and colitis, unspecified: Secondary | ICD-10-CM | POA: Insufficient documentation

## 2018-08-25 DIAGNOSIS — F0390 Unspecified dementia without behavioral disturbance: Secondary | ICD-10-CM | POA: Insufficient documentation

## 2018-08-25 DIAGNOSIS — Z79899 Other long term (current) drug therapy: Secondary | ICD-10-CM | POA: Insufficient documentation

## 2018-08-25 LAB — CBC WITH DIFFERENTIAL/PLATELET
Abs Immature Granulocytes: 0.04 10*3/uL (ref 0.00–0.07)
Basophils Absolute: 0 10*3/uL (ref 0.0–0.1)
Basophils Relative: 0 %
Eosinophils Absolute: 0.1 10*3/uL (ref 0.0–0.5)
Eosinophils Relative: 1 %
HCT: 42.3 % (ref 36.0–46.0)
Hemoglobin: 13.4 g/dL (ref 12.0–15.0)
Immature Granulocytes: 0 %
Lymphocytes Relative: 9 %
Lymphs Abs: 0.8 10*3/uL (ref 0.7–4.0)
MCH: 29.3 pg (ref 26.0–34.0)
MCHC: 31.7 g/dL (ref 30.0–36.0)
MCV: 92.6 fL (ref 80.0–100.0)
Monocytes Absolute: 0.3 10*3/uL (ref 0.1–1.0)
Monocytes Relative: 3 %
Neutro Abs: 8.4 10*3/uL — ABNORMAL HIGH (ref 1.7–7.7)
Neutrophils Relative %: 87 %
PLATELETS: 281 10*3/uL (ref 150–400)
RBC: 4.57 MIL/uL (ref 3.87–5.11)
RDW: 12.9 % (ref 11.5–15.5)
WBC: 9.7 10*3/uL (ref 4.0–10.5)
nRBC: 0 % (ref 0.0–0.2)

## 2018-08-25 LAB — URINALYSIS, ROUTINE W REFLEX MICROSCOPIC
Bacteria, UA: NONE SEEN
Bilirubin Urine: NEGATIVE
Glucose, UA: NEGATIVE mg/dL
Ketones, ur: NEGATIVE mg/dL
LEUKOCYTE UA: NEGATIVE
Nitrite: NEGATIVE
Protein, ur: NEGATIVE mg/dL
Specific Gravity, Urine: 1.005 (ref 1.005–1.030)
pH: 8 (ref 5.0–8.0)

## 2018-08-25 LAB — COMPREHENSIVE METABOLIC PANEL
ALT: 14 U/L (ref 0–44)
ANION GAP: 11 (ref 5–15)
AST: 19 U/L (ref 15–41)
Albumin: 3.4 g/dL — ABNORMAL LOW (ref 3.5–5.0)
Alkaline Phosphatase: 119 U/L (ref 38–126)
BUN: 10 mg/dL (ref 8–23)
CO2: 24 mmol/L (ref 22–32)
Calcium: 9.2 mg/dL (ref 8.9–10.3)
Chloride: 103 mmol/L (ref 98–111)
Creatinine, Ser: 0.83 mg/dL (ref 0.44–1.00)
GFR calc Af Amer: >60 mL/min (ref >60)
GFR calc non Af Amer: >60 mL/min (ref >60)
Glucose, Bld: 124 mg/dL — ABNORMAL HIGH (ref 70–99)
POTASSIUM: 3.7 mmol/L (ref 3.5–5.1)
Sodium: 138 mmol/L (ref 135–145)
Total Bilirubin: 0.7 mg/dL (ref 0.3–1.2)
Total Protein: 8.3 g/dL — ABNORMAL HIGH (ref 6.5–8.1)

## 2018-08-25 LAB — GRAM STAIN

## 2018-08-25 LAB — LIPASE, BLOOD: Lipase: 19 U/L (ref 11–51)

## 2018-08-25 MED ORDER — ONDANSETRON HCL 4 MG/2ML IJ SOLN
4.0000 mg | Freq: Once | INTRAMUSCULAR | Status: AC
  Administered 2018-08-25: 4 mg via INTRAVENOUS
  Filled 2018-08-25: qty 2

## 2018-08-25 MED ORDER — ONDANSETRON HCL 4 MG PO TABS: 4.0000 mg | ORAL_TABLET | Freq: Three times a day (TID) | ORAL | 0 refills | Status: DC | PRN

## 2018-08-25 MED ORDER — IOHEXOL 300 MG/ML  SOLN
100.0000 mL | Freq: Once | INTRAMUSCULAR | Status: AC | PRN
  Administered 2018-08-25: 100 mL via INTRAVENOUS

## 2018-08-25 MED ORDER — LACTATED RINGERS IV BOLUS
1000.0000 mL | Freq: Once | INTRAVENOUS | Status: AC
  Administered 2018-08-25: 1000 mL via INTRAVENOUS

## 2018-08-25 NOTE — ED Notes (Signed)
Pt ambulated to the bathroom w/o any difficulties  
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
## 2018-08-25 NOTE — ED Triage Notes (Addendum)
Pt arrives via EMS. Pt started feeling nauseous yesterday and having abd cramping; vomited once. BP 190/90, HR 70 CBG 159. Pt alert oriented for EMS. Denies CP/SOB.

## 2018-08-25 NOTE — ED Notes (Signed)
Patient transported to CT 

## 2018-08-26 LAB — URINE CULTURE: Culture: <10000 — AB

## 2018-09-27 ENCOUNTER — Encounter (HOSPITAL_COMMUNITY): Payer: Self-pay

## 2018-09-27 ENCOUNTER — Other Ambulatory Visit: Payer: Self-pay

## 2018-09-27 ENCOUNTER — Emergency Department (HOSPITAL_COMMUNITY): Payer: PRIVATE HEALTH INSURANCE

## 2018-09-27 ENCOUNTER — Emergency Department (HOSPITAL_COMMUNITY)
Admission: EM | Admit: 2018-09-27 | Discharge: 2018-09-27 | Disposition: A | Discharge status: Home / Self Care | Payer: PRIVATE HEALTH INSURANCE | Attending: Emergency Medicine | Admitting: Emergency Medicine

## 2018-09-27 DIAGNOSIS — J45909 Unspecified asthma, uncomplicated: Secondary | ICD-10-CM | POA: Insufficient documentation

## 2018-09-27 DIAGNOSIS — I10 Essential (primary) hypertension: Secondary | ICD-10-CM | POA: Insufficient documentation

## 2018-09-27 DIAGNOSIS — F0390 Unspecified dementia without behavioral disturbance: Secondary | ICD-10-CM | POA: Insufficient documentation

## 2018-09-27 DIAGNOSIS — J449 Chronic obstructive pulmonary disease, unspecified: Secondary | ICD-10-CM | POA: Insufficient documentation

## 2018-09-27 DIAGNOSIS — J9801 Acute bronchospasm: Secondary | ICD-10-CM | POA: Insufficient documentation

## 2018-09-27 HISTORY — DX: Chronic obstructive pulmonary disease, unspecified: J44.9

## 2018-09-27 HISTORY — DX: Unspecified asthma, uncomplicated: J45.909

## 2018-09-27 MED ORDER — ACETAMINOPHEN 325 MG PO TABS
650.0000 mg | ORAL_TABLET | Freq: Once | ORAL | Status: AC
  Administered 2018-09-27: 650 mg via ORAL
  Filled 2018-09-27: qty 2

## 2018-09-27 MED ORDER — ALBUTEROL SULFATE HFA 108 (90 BASE) MCG/ACT IN AERS
2.0000 | INHALATION_SPRAY | Freq: Once | RESPIRATORY_TRACT | Status: AC
  Administered 2018-09-27: 2 via RESPIRATORY_TRACT
  Filled 2018-09-27: qty 6.7

## 2018-09-27 MED ORDER — PREDNISONE 20 MG PO TABS
60.0000 mg | ORAL_TABLET | Freq: Once | ORAL | Status: AC
  Administered 2018-09-27: 60 mg via ORAL
  Filled 2018-09-27: qty 3
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
## 2018-09-27 NOTE — Discharge Instructions (Addendum)
We think that your symptoms are because of bronchospasm. Take 2 puffs of the inhaler every 4 hours if you have shortness of breath or cough. See your primary care doctor in 1 week.
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
## 2018-09-27 NOTE — ED Notes (Signed)
Bed: WA17 Expected date: 09/27/18 Expected time: 8:39 AM Means of arrival: Ambulance Comments: EMS SHOB, cough, no fever

## 2019-08-14 ENCOUNTER — Encounter (HOSPITAL_COMMUNITY): Payer: Self-pay | Admitting: *Deleted

## 2019-08-14 ENCOUNTER — Emergency Department (HOSPITAL_COMMUNITY)
Admission: EM | Admit: 2019-08-14 | Discharge: 2019-08-17 | Disposition: A | Discharge status: Skilled Nursing Facility | Payer: Self-pay | Attending: Emergency Medicine | Admitting: Emergency Medicine

## 2019-08-14 ENCOUNTER — Other Ambulatory Visit: Payer: Self-pay

## 2019-08-14 DIAGNOSIS — J449 Chronic obstructive pulmonary disease, unspecified: Secondary | ICD-10-CM | POA: Insufficient documentation

## 2019-08-14 DIAGNOSIS — Z20822 Contact with and (suspected) exposure to covid-19: Secondary | ICD-10-CM | POA: Insufficient documentation

## 2019-08-14 DIAGNOSIS — J45909 Unspecified asthma, uncomplicated: Secondary | ICD-10-CM | POA: Insufficient documentation

## 2019-08-14 DIAGNOSIS — Z751 Person awaiting admission to adequate facility elsewhere: Secondary | ICD-10-CM

## 2019-08-14 DIAGNOSIS — I10 Essential (primary) hypertension: Secondary | ICD-10-CM | POA: Insufficient documentation

## 2019-08-14 DIAGNOSIS — F22 Delusional disorders: Secondary | ICD-10-CM | POA: Insufficient documentation

## 2019-08-14 DIAGNOSIS — Z79899 Other long term (current) drug therapy: Secondary | ICD-10-CM | POA: Insufficient documentation

## 2019-08-14 DIAGNOSIS — F0390 Unspecified dementia without behavioral disturbance: Secondary | ICD-10-CM | POA: Insufficient documentation

## 2019-08-14 LAB — SALICYLATE LEVEL: Salicylate Lvl: <7 mg/dL — ABNORMAL LOW (ref 7.0–30.0)

## 2019-08-14 LAB — RESPIRATORY PANEL BY RT PCR (FLU A&B, COVID)
Influenza A by PCR: NEGATIVE
Influenza B by PCR: NEGATIVE
SARS Coronavirus 2 by RT PCR: NEGATIVE

## 2019-08-14 LAB — COMPREHENSIVE METABOLIC PANEL
ALT: 15 U/L (ref 0–44)
AST: 21 U/L (ref 15–41)
Albumin: 3.9 g/dL (ref 3.5–5.0)
Alkaline Phosphatase: 117 U/L (ref 38–126)
Anion gap: 8 (ref 5–15)
BUN: 14 mg/dL (ref 8–23)
CO2: 28 mmol/L (ref 22–32)
Calcium: 9.2 mg/dL (ref 8.9–10.3)
Chloride: 104 mmol/L (ref 98–111)
Creatinine, Ser: 1.09 mg/dL — ABNORMAL HIGH (ref 0.44–1.00)
GFR calc Af Amer: >60 mL/min (ref >60)
GFR calc non Af Amer: 53 mL/min — ABNORMAL LOW (ref >60)
Glucose, Bld: 131 mg/dL — ABNORMAL HIGH (ref 70–99)
Potassium: 3.7 mmol/L (ref 3.5–5.1)
Sodium: 140 mmol/L (ref 135–145)
Total Bilirubin: 0.4 mg/dL (ref 0.3–1.2)
Total Protein: 9 g/dL — ABNORMAL HIGH (ref 6.5–8.1)

## 2019-08-14 LAB — RAPID URINE DRUG SCREEN, HOSP PERFORMED
Amphetamines: NOT DETECTED
Barbiturates: NOT DETECTED
Benzodiazepines: NOT DETECTED
Cocaine: NOT DETECTED
Opiates: NOT DETECTED
Tetrahydrocannabinol: NOT DETECTED

## 2019-08-14 LAB — CBC
HCT: 44.9 % (ref 36.0–46.0)
Hemoglobin: 14.4 g/dL (ref 12.0–15.0)
MCH: 30.4 pg (ref 26.0–34.0)
MCHC: 32.1 g/dL (ref 30.0–36.0)
MCV: 94.9 fL (ref 80.0–100.0)
Platelets: 284 10*3/uL (ref 150–400)
RBC: 4.73 MIL/uL (ref 3.87–5.11)
RDW: 12.5 % (ref 11.5–15.5)
WBC: 10.6 10*3/uL — ABNORMAL HIGH (ref 4.0–10.5)
nRBC: 0 % (ref 0.0–0.2)

## 2019-08-14 LAB — URINALYSIS, ROUTINE W REFLEX MICROSCOPIC
Bilirubin Urine: NEGATIVE
Glucose, UA: NEGATIVE mg/dL
Hgb urine dipstick: NEGATIVE
Ketones, ur: 5 mg/dL — AB
Nitrite: NEGATIVE
Protein, ur: NEGATIVE mg/dL
Specific Gravity, Urine: 1.018 (ref 1.005–1.030)
pH: 5 (ref 5.0–8.0)

## 2019-08-14 LAB — ACETAMINOPHEN LEVEL: Acetaminophen (Tylenol), Serum: <10 ug/mL — ABNORMAL LOW (ref 10–30)

## 2019-08-14 LAB — ETHANOL: Alcohol, Ethyl (B): <10 mg/dL (ref <10)

## 2019-08-14 MED ORDER — GUAIFENESIN-DM 100-10 MG/5ML PO SYRP
5.0000 mL | ORAL_SOLUTION | ORAL | Status: DC | PRN
  Administered 2019-08-14 – 2019-08-17 (×5): 5 mL via ORAL
  Filled 2019-08-14 (×7): qty 5

## 2019-08-14 MED ORDER — HYDROCHLOROTHIAZIDE 12.5 MG PO CAPS
12.5000 mg | ORAL_CAPSULE | Freq: Every day | ORAL | Status: DC
  Administered 2019-08-15 – 2019-08-17 (×3): 12.5 mg via ORAL
  Filled 2019-08-14 (×3): qty 1

## 2019-08-14 MED ORDER — ACETAMINOPHEN 325 MG PO TABS
650.0000 mg | ORAL_TABLET | Freq: Four times a day (QID) | ORAL | Status: DC | PRN
  Administered 2019-08-14: 650 mg via ORAL
  Filled 2019-08-14: qty 2

## 2019-08-14 MED ORDER — ALBUTEROL SULFATE HFA 108 (90 BASE) MCG/ACT IN AERS: 1.0000 | INHALATION_SPRAY | Freq: Four times a day (QID) | RESPIRATORY_TRACT | Status: DC | PRN
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
## 2019-08-14 NOTE — ED Triage Notes (Signed)
Pt was in front of the office of her apartment complex.  Pt was refusing to leave and exhibiting delusion and hallucinations.  Pt has been wondering off.  Pt lives with her daughter.  Pt also doesn't eat because she believes the food is poisonous.  Pt refuses to sign paperwork to get social security or any other aid to help get her placed.  Pt IVC'd by [employer]. Pt is not currently on any medication.

## 2019-08-14 NOTE — ED Notes (Signed)
Report given to TCU, pt moved to room 31.

## 2019-08-14 NOTE — Progress Notes (Signed)
Patient meets inpatient criteria per Assunta Found, NP. Patient has been faxed out to the following facilities for review:   [facility]  [facility]  CSW will continue to follow and assist with disposition planning.   Drucilla Schmidt, MSW, LCSW-A Clinical Disposition Social Worker [facility]/TTS [phone]

## 2019-08-14 NOTE — BH Assessment (Signed)
[facility] Assessment Progress Note  Case was staffed with Rankin NP who recommended a inpatient admission to assist with stabilization (Geropsychiatry).

## 2019-08-14 NOTE — ED Notes (Signed)
COVID test resulted negative. Frequent dry cough noted. She is also overheard by Clinical research associate talking to self nearly constantly. She is appropriate when asks Clinical research associate a question. She did request some graham crackers and was given them along with drinking a ginger ale and a cup of water.
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
## 2019-08-14 NOTE — ED Notes (Signed)
Pt believes her last name is "Thurmond Butts" but daughter confirmed to [employer] that pt's last name is "Melvyn Neth."

## 2019-08-14 NOTE — ED Notes (Signed)
Consulted with Dr Effie Shy for cough med for her frequent cough.
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
## 2019-08-14 NOTE — ED Notes (Signed)
Dr Long at bedside

## 2019-08-15 ENCOUNTER — Encounter (HOSPITAL_COMMUNITY): Payer: Self-pay | Admitting: Registered Nurse

## 2019-08-15 ENCOUNTER — Emergency Department (HOSPITAL_COMMUNITY): Payer: Self-pay

## 2019-08-15 DIAGNOSIS — F22 Delusional disorders: Secondary | ICD-10-CM

## 2019-08-15 MED ORDER — QUETIAPINE FUMARATE 50 MG PO TABS
50.0000 mg | ORAL_TABLET | Freq: Every day | ORAL | Status: DC
  Administered 2019-08-16: 50 mg via ORAL
  Filled 2019-08-15 (×2): qty 1

## 2019-08-15 NOTE — ED Notes (Signed)
Awake and again chronically dry coughing and also can be heard talking to herself in a quiet tone of voice.
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
## 2019-08-15 NOTE — ED Notes (Signed)
Awake and coughing again. Asked if she wanted anything, if she needed her inhaler. She declined stating she wasn't short of breath but wanted a Coke. She ws given it and vitals rechecked. O2 little low, showing no signs of SOB or distress.
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
## 2019-08-16 LAB — URINE CULTURE: Culture: <10000 — AB
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
## 2019-08-16 NOTE — BH Assessment (Signed)
Per Assunta Found, NP, patient continues to meet criteria for inpatient treatment.  *Under Review:  Carolyn Huynh *Re-faxed referrals to the following facilities:   [facility]       [facility]    [facility]       [facility]       [facility]      [facility]        [facility]       [facility]       [facility]

## 2019-08-17 NOTE — BH Assessment (Signed)
[facility] Assessment Progress Note  Per Ophelia Shoulder, NP, this pt continues to require psychiatric hospitalization at this time.  Pt remains under IVC initiated by law enforcement and upheld by EDP Alona Bene, MD on 08/14/2019.  The following facilities have been contacted to seek placement for this pt, with results as noted:  Beds available, information sent, decision pending: [facility]  Not referred: [facility] (dementia is exclusionary) [facility] (dementia is exclusionary) [facility] (dementia is exclusionary) [facility] (dementia is exclusionary)  Unable to reach: [facility] (left message at 13:07)   At capacity: [facility] (possible availability on 08/18/2019) [facility] (closed due to Covid-19) [facility] (closed due to Covid-19)   Doylene Canning, [place] Behavioral Health Coordinator [phone]

## 2019-08-17 NOTE — Progress Notes (Signed)
Sheriff here to transport patient. Called [facility] to let them know she was on her way.

## 2019-08-17 NOTE — Consult Note (Signed)
[facility] Psych ED Progress Note  08/17/2019 11:50 AM Carolyn Huynh  MRN:  [id number] Subjective:  "I see what I see and hear what I hear"   Patient seen via telepsych by this provider; chart reviewed and consulted with Dr. Lucianne Muss on 08/17/19. On evaluation Carolyn Huynh is guarded, and appears irritated with asked orientation questions.  She believes she spoke with this writer in the past and seems to perseverate on this fixed delusion so much that she demonstrates difficulty in moving on subject wise.  She is somewhat difficult to verbally redirect, looks away and appears to shut down.  She denies audible or visual hallucinations but during the assessment is seen looking around the room responding to external stimulus.  Per nursing, she has been compliant with her medications but continues to present delusional.        Principal Problem: Paranoia (HCC) Diagnosis:  Principal Problem:   Paranoia (HCC)  Total Time spent with patient: 30 minutes  Past Psychiatric History: as outlined below  Past Medical History:  Past Medical History:  Diagnosis Date  . Asthma   . COPD (chronic obstructive pulmonary disease) (HCC)   . Dementia (HCC)   . Hypertension   . Pneumothorax     Past Surgical History:  Procedure Laterality Date  . CHEST TUBE INSERTION    . VIDEO ASSISTED THORACOSCOPY Right 02/01/2018   Procedure: VIDEO ASSISTED THORACOSCOPY, BLEB STAPLING;  Surgeon: Kerin Perna, MD;  Location: [facility] OR;  Service: Thoracic;  Laterality: Right;   Family History: History reviewed. No pertinent family history. Family Psychiatric  History: unknown Social History:  Social History   Substance and Sexual Activity  Alcohol Use Not Currently     Social History   Substance and Sexual Activity  Drug Use Never    Social History   Socioeconomic History  . Marital status: Significant Other    Spouse name: Not on file  . Number of children: Not on file  . Years of education: Not on file  .  Highest education level: Not on file  Occupational History  . Not on file  Tobacco Use  . Smoking status: Never Smoker  . Smokeless tobacco: Never Used  Substance and Sexual Activity  . Alcohol use: Not Currently  . Drug use: Never  . Sexual activity: Not on file  Other Topics Concern  . Not on file  Social History Narrative  . Not on file   Social Determinants of Health   Financial Resource Strain:   . Difficulty of Paying Living Expenses: Not on file  Food Insecurity:   . Worried About Programme researcher, broadcasting/film/video in the Last Year: Not on file  . Ran Out of Food in the Last Year: Not on file  Transportation Needs:   . Lack of Transportation (Medical): Not on file  . Lack of Transportation (Non-Medical): Not on file  Physical Activity:   . Days of Exercise per Week: Not on file  . Minutes of Exercise per Session: Not on file  Stress:   . Feeling of Stress : Not on file  Social Connections:   . Frequency of Communication with Friends and Family: Not on file  . Frequency of Social Gatherings with Friends and Family: Not on file  . Attends Religious Services: Not on file  . Active Member of Clubs or Organizations: Not on file  . Attends Banker Meetings: Not on file  . Marital Status: Not on file    Sleep:  Fair  Appetite:  Good  Current Medications: Current Facility-Administered Medications  Medication Dose Route Frequency Provider Last Rate Last Admin  . acetaminophen (TYLENOL) tablet 650 mg  650 mg Oral Q6H PRN Margette Fast, MD   650 mg at 08/14/19 2234  . albuterol (VENTOLIN HFA) 108 (90 Base) MCG/ACT inhaler 1-2 puff  1-2 puff Inhalation Q6H PRN Long, Wonda Olds, MD      . guaiFENesin-dextromethorphan (ROBITUSSIN DM) 100-10 MG/5ML syrup 5 mL  5 mL Oral Q4H PRN Daleen Bo, MD   5 mL at 08/17/19 0422  . hydrochlorothiazide (MICROZIDE) capsule 12.5 mg  12.5 mg Oral Daily Long, Wonda Olds, MD   12.5 mg at 08/17/19 0904  . QUEtiapine (SEROQUEL) tablet 50 mg  50 mg  Oral QHS Rankin, Shuvon B, NP   50 mg at 08/16/19 2110   Current Outpatient Medications  Medication Sig Dispense Refill  . acetaminophen (TYLENOL) 325 MG tablet Take 2 tablets (650 mg total) by mouth every 6 (six) hours as needed for mild pain (or Fever >/= 101).    Marland Kitchen albuterol (PROVENTIL HFA;VENTOLIN HFA) 108 (90 Base) MCG/ACT inhaler Inhale 1-2 puffs into the lungs every 6 (six) hours as needed for wheezing or shortness of breath. 8 g 3  . hydrochlorothiazide (MICROZIDE) 12.5 MG capsule Take 1 capsule (12.5 mg total) by mouth daily. 30 capsule 3  . ondansetron (ZOFRAN) 4 MG tablet Take 1 tablet (4 mg total) by mouth every 8 (eight) hours as needed for up to 10 doses for nausea or vomiting. 10 tablet 0  . oxyCODONE (OXY IR/ROXICODONE) 5 MG immediate release tablet Take 1 tablet (5 mg total) by mouth every 6 (six) hours as needed for severe pain. 20 tablet 0  . potassium chloride (K-DUR,KLOR-CON) 10 MEQ tablet Take 1 tablet (10 mEq total) by mouth daily. 30 tablet 3    Lab Results: No results found for this or any previous visit (from the past 48 hour(s)).  Blood Alcohol level:  Lab Results  Component Value Date   ETH <10 08/14/2019    Physical Findings: AIMS:  , ,  ,  ,    CIWA:    COWS:     Musculoskeletal: Strength & Muscle Tone: within normal limits Gait & Station: did not assess, patient laying in bed Patient leans: N/A  Psychiatric Specialty Exam: Physical Exam  Review of Systems  Blood pressure 109/73, pulse 64, temperature 97.7 F (36.5 C), temperature source Oral, resp. rate 20, SpO2 91 %.There is no height or weight on file to calculate BMI.  General Appearance: Bizarre and hair uncombed, fairly groomed in scrubs  Eye Contact:  Fair  Speech:  Clear and Coherent and Normal Rate  Volume:  Normal  Mood:  Anxious, Dysphoric and Irritable  Affect:  Congruent and Flat  Thought Process:  Descriptions of Associations: Tangential  Orientation:  Other:  due to active  psychosis only able to assess orientation to self  Thought Content:  Illogical, Delusions, Hallucinations: Visual patient appears to respond to external stimulus during assessment but minimizes this and Rumination  Suicidal Thoughts:  No  Homicidal Thoughts:  No  Memory:  Immediate;   Poor Recent;   Poor Remote;   Poor  Judgement:  Impaired  Insight:  Lacking  Psychomotor Activity:  Normal  Concentration:  Concentration: Poor and Attention Span: Poor  Recall:  unable to assess   Fund of Knowledge:  Fair  Language:  Fair  Akathisia:  NA  Handed:  Right  AIMS (if indicated):     Assets:  Desire for Improvement Social Support  ADL's:  Intact  Cognition:  Impaired,  Moderate  Sleep:   <6 hours    Treatment Plan Summary: Daily contact with patient to assess and evaluate symptoms and progress in treatment and Medication management   Continue Seroquel 50 mg Q hs for mood stabilization Continue hctz 12.5mg  daily for HTN Continue Robitussin DM 5ml orally q 4 hours for cough, prn Continue Albuterol (ventolin hfa) 108(90 base) MCG/ACT inhaler 1-2 puffs q 6 hours prn wheezing and SOB   SW to continue to seek gero inpatient psychiatric placement   Chales Abrahams, NP 08/17/2019, 11:50 AM

## 2019-08-17 NOTE — BH Assessment (Signed)
[facility] Assessment Progress Note  Per Ophelia Shoulder. FNP, this pt requires psychiatric hospitalization at this time.  Pt presents under IVC initiated by law enforcement, which EDP Alona Bene, MF has upheld.  At 13:58 Thayer Ohm calls from [facility] to report that pt has been accepted to their facility by Dr Beatris Ship.  Marlinda Mike concurs with this decision.  Pt's nurse, Raoul Pitch, has been notified, and agrees to call report to [phone].  Pt is to be transported via [facility].  [facility] stipulates that pt must arrive either before 23:00 tonight, or after 06:00 tomorrow.    Doylene Canning Behavioral Health Coordinator [phone]

## 2019-08-17 NOTE — ED Notes (Signed)
Sheriff called for transport for [employer]. States that they are currently on their way to [place] and once they are back they will take Ms. Prunty.

## 2019-09-03 DIAGNOSIS — F1021 Alcohol dependence, in remission: Secondary | ICD-10-CM

## 2019-11-13 IMAGING — DX DG CHEST 1V PORT
1 series · 1 of 1 positions shown · non-contrast
Comparison: 01/15/2018

CLINICAL DATA: Follow-up pneumothorax

EXAM:
PORTABLE CHEST 1 VIEW

[chest ap]
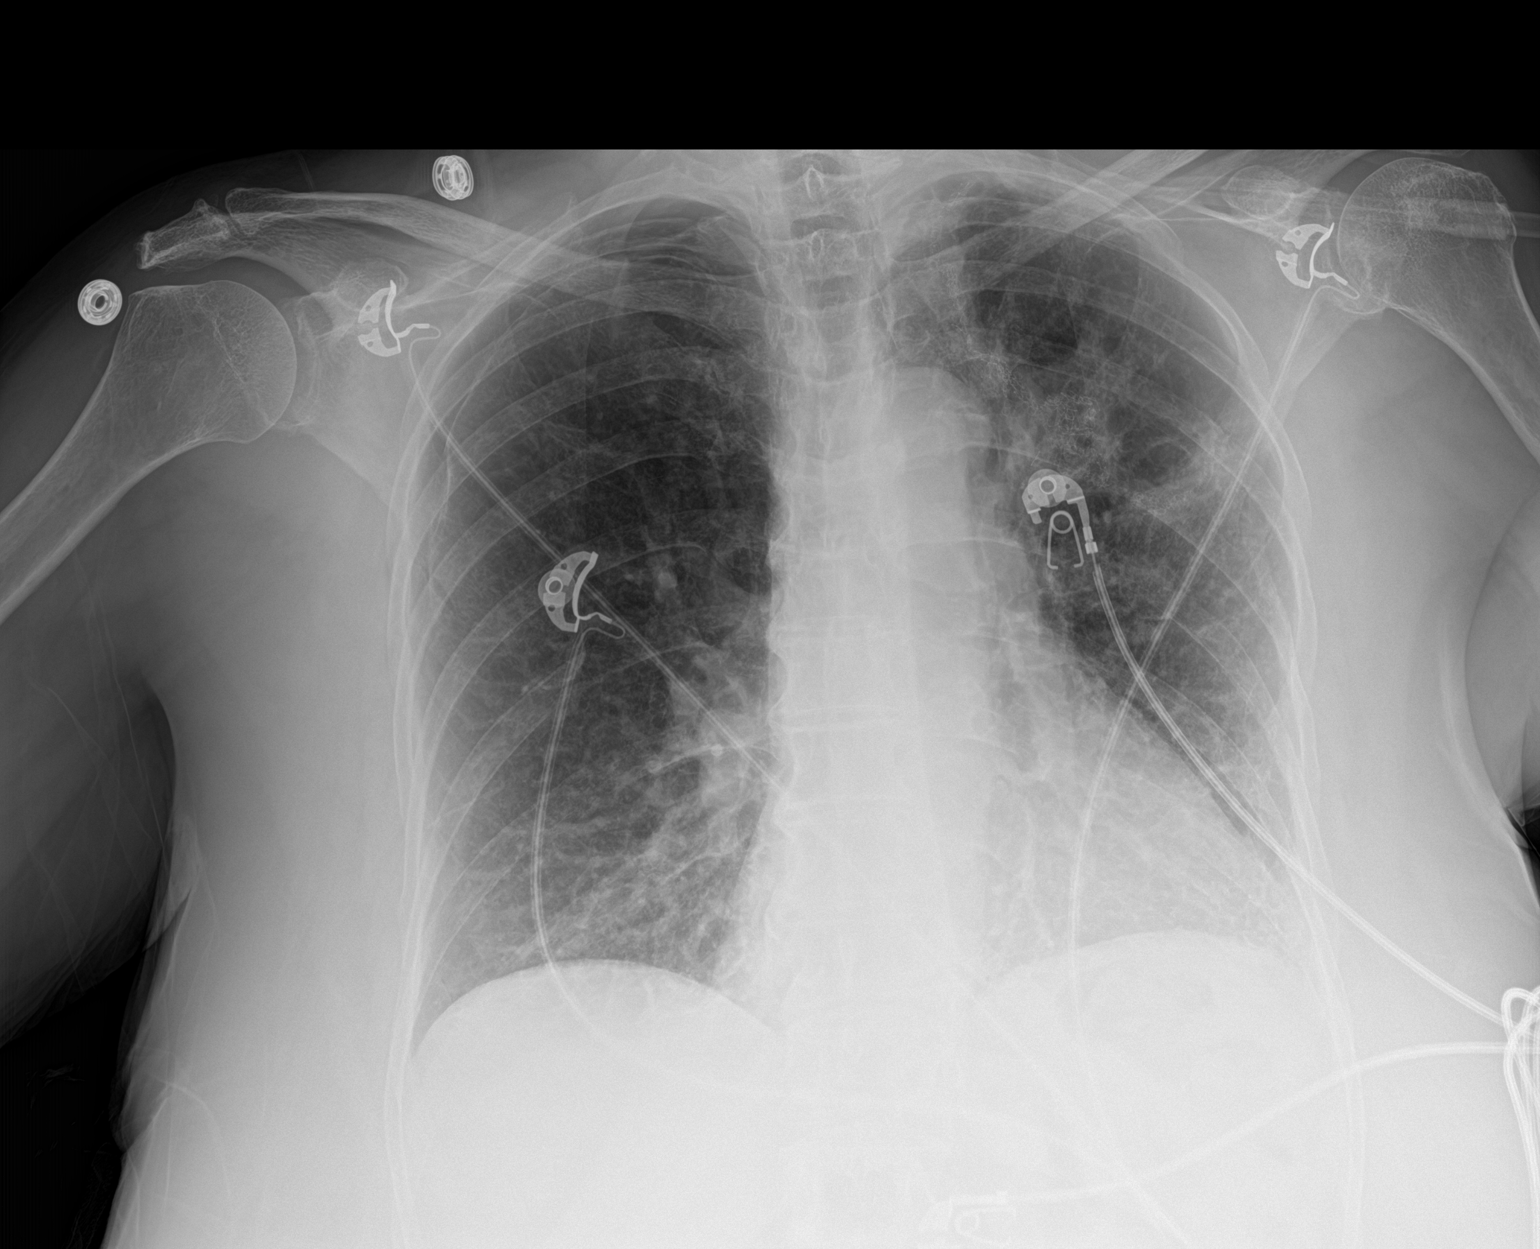

[1 of 1 positions shown; findings below may reference images not displayed]

FINDINGS: Cardiac shadow is stable. There are changes consistent with small
right-sided pneumothorax. The overall appearance is stable.
Postsurgical changes on the left are noted with associated scarring.

No bony abnormality is noted.
IMPRESSION: Stable small right pneumothorax

## 2019-11-13 IMAGING — DX DG CHEST 1V PORT
1 series · 1 of 1 positions shown · non-contrast
Comparison: Radiograph earlier this day.

CLINICAL DATA: Pneumothorax, chest tube placement.

EXAM:
PORTABLE CHEST 1 VIEW

[chest]
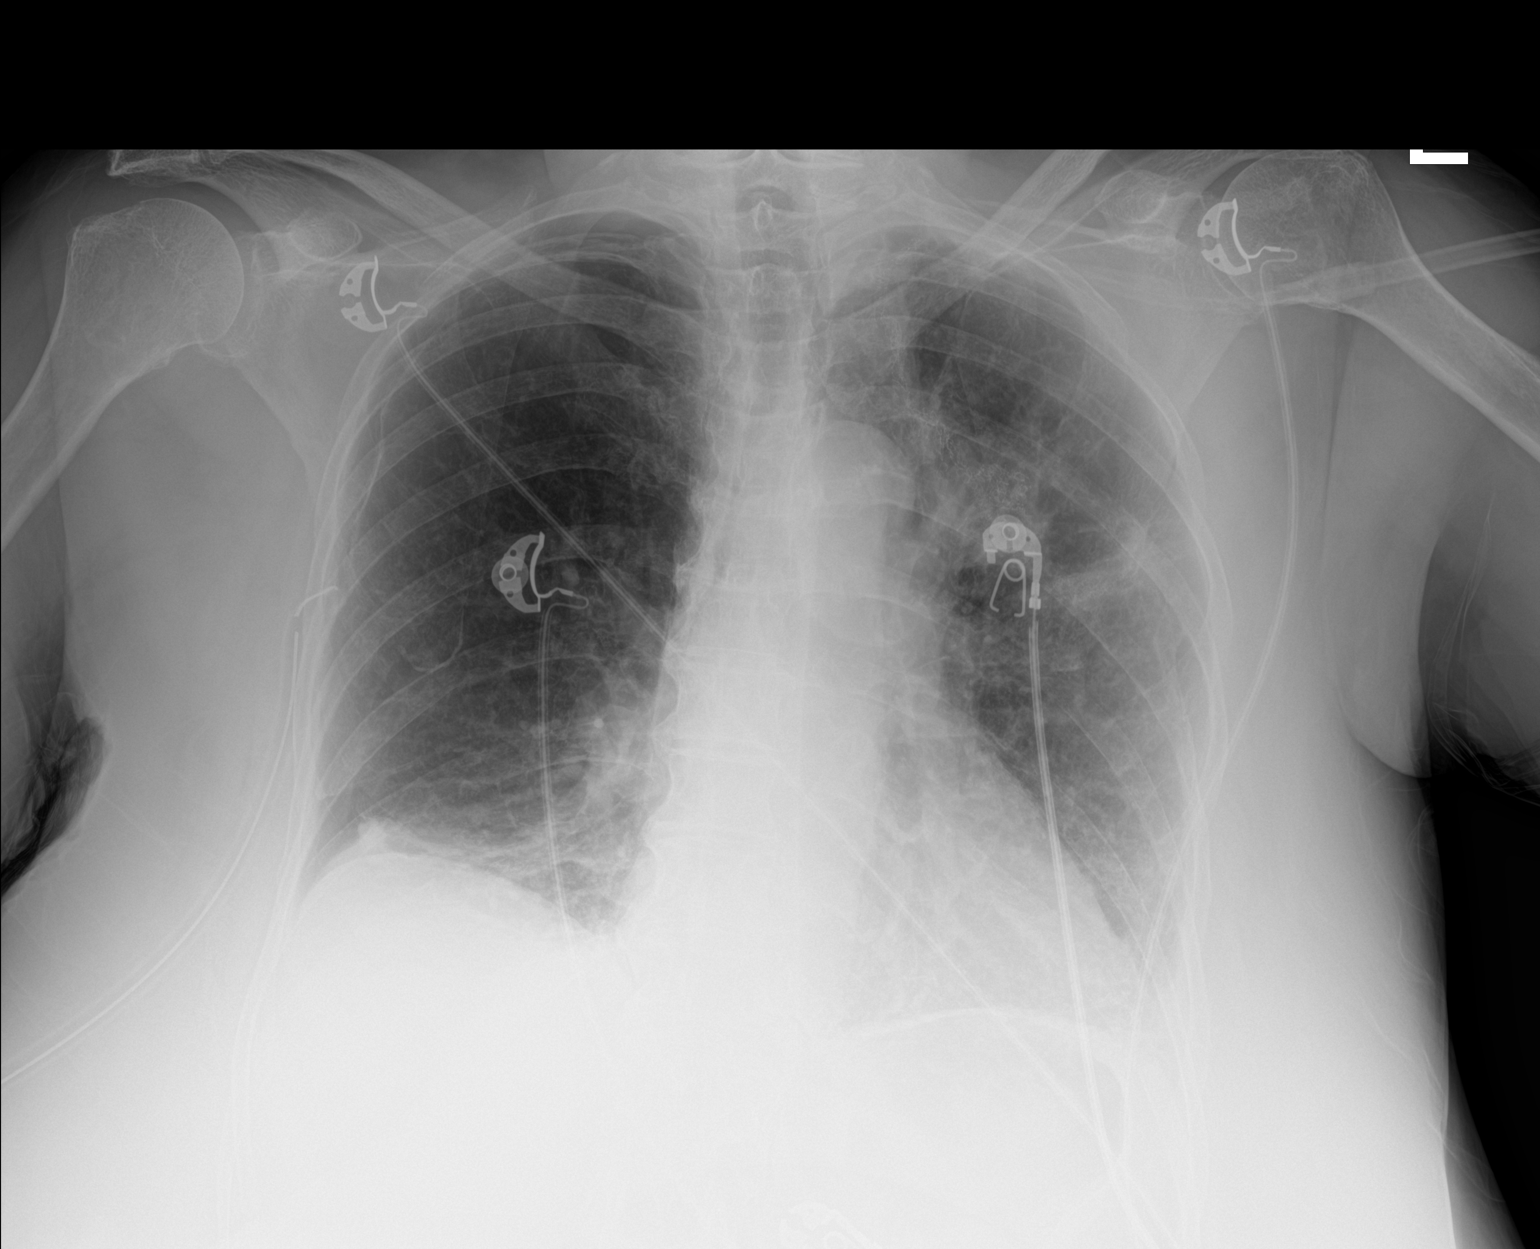

[1 of 1 positions shown; findings below may reference images not displayed]

FINDINGS: Right-sided chest tube in place with significant decreased size of
right pneumothorax. The side port of the chest tube is likely in the
chest wall. Small residual pneumothorax apical and laterally.
Improved and near completely resolved mediastinal shift. Streaky
right basilar opacities. Surgical sutures in the left perihilar
region. Patchy opacity throughout the left lung, with some
improvement.
IMPRESSION: 1. Significant decreased size right pneumothorax post chest tube
placement with small residual apical and lateral component. Near
completely resolved mediastinal shift. The side port of the chest
tube is likely within the soft tissues, recommend repositioning.
2. Streaky right lower lobe opacity is likely atelectasis.
3. Patchy opacities throughout the left lung with improvement,
likely improving atelectasis.

## 2019-11-13 IMAGING — DX DG CHEST 1V PORT
1 series · 1 of 1 positions shown · non-contrast
Comparison: None.

CLINICAL DATA: Shortness of breath.

EXAM:
PORTABLE CHEST 1 VIEW

[chest]
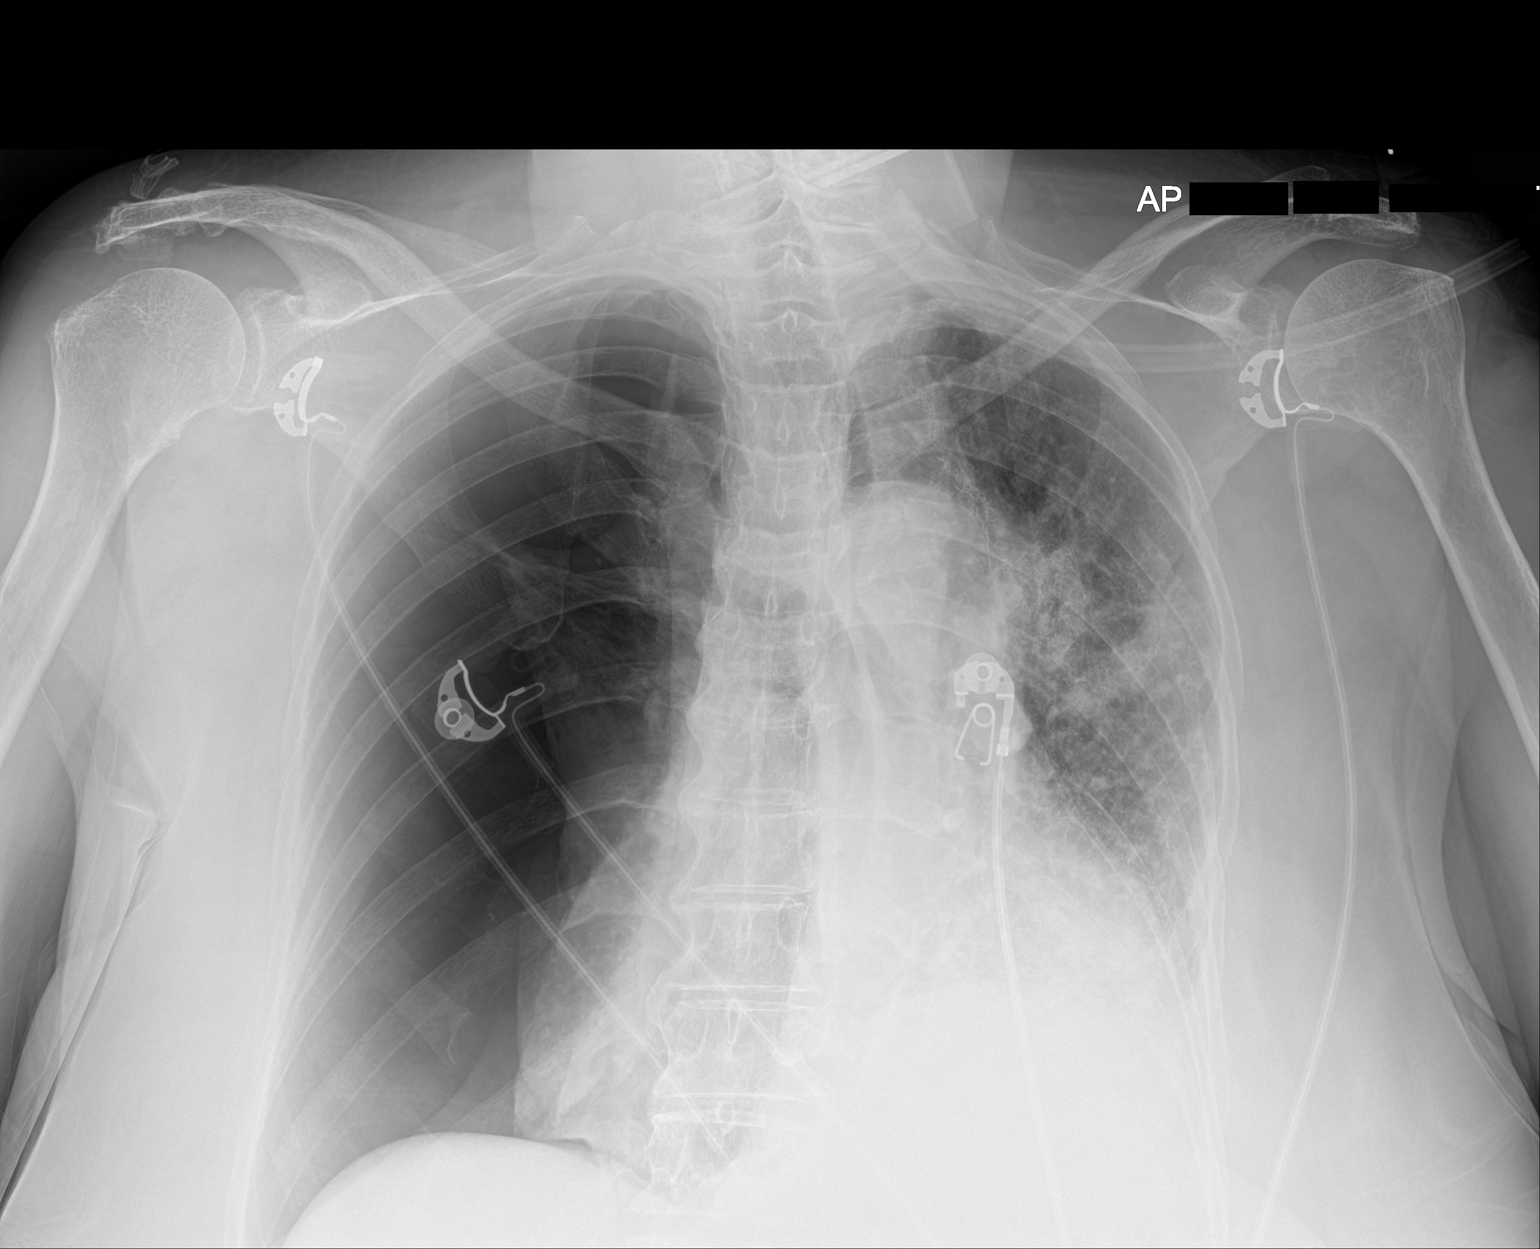

[1 of 1 positions shown; findings below may reference images not displayed]

FINDINGS: Large right sided pneumothorax with right lung collapse down
medially. There does appear to be some tethering a in the right
upper lung. Cardiomediastinal anatomy is shifted to the left. Left
hemidiaphragm is elevated. There are fairly extensive staple lines
in the left upper lung suggesting prior wedge resection or
lobectomy. Patchy opacity in the left lung may be atelectatic
although infection possible. The visualized bony structures of the
thorax are intact. Telemetry leads overlie the chest.
IMPRESSION: Large right pneumothorax with shift of cardiomediastinal anatomy to
the left, raising concern for tension pneumothorax.

Postsurgical changes in the left lung with diffuse patchy left lung
opacity potentially atelectasis.

Critical Value/emergent results were called by me at the time of
interpretation on 01/15/2018 at [DATE] to Dr. LJUBINKO NANCY , who
verbally acknowledged these results.

## 2019-11-20 IMAGING — DX DG CHEST 1V PORT
1 series · 1 of 1 positions shown · non-contrast
Comparison: Chest radiograph performed 01/17/2018

CLINICAL DATA: Acute onset of shortness of breath and productive
cough. Generalized chest congestion.

EXAM:
PORTABLE CHEST 1 VIEW

[chest]
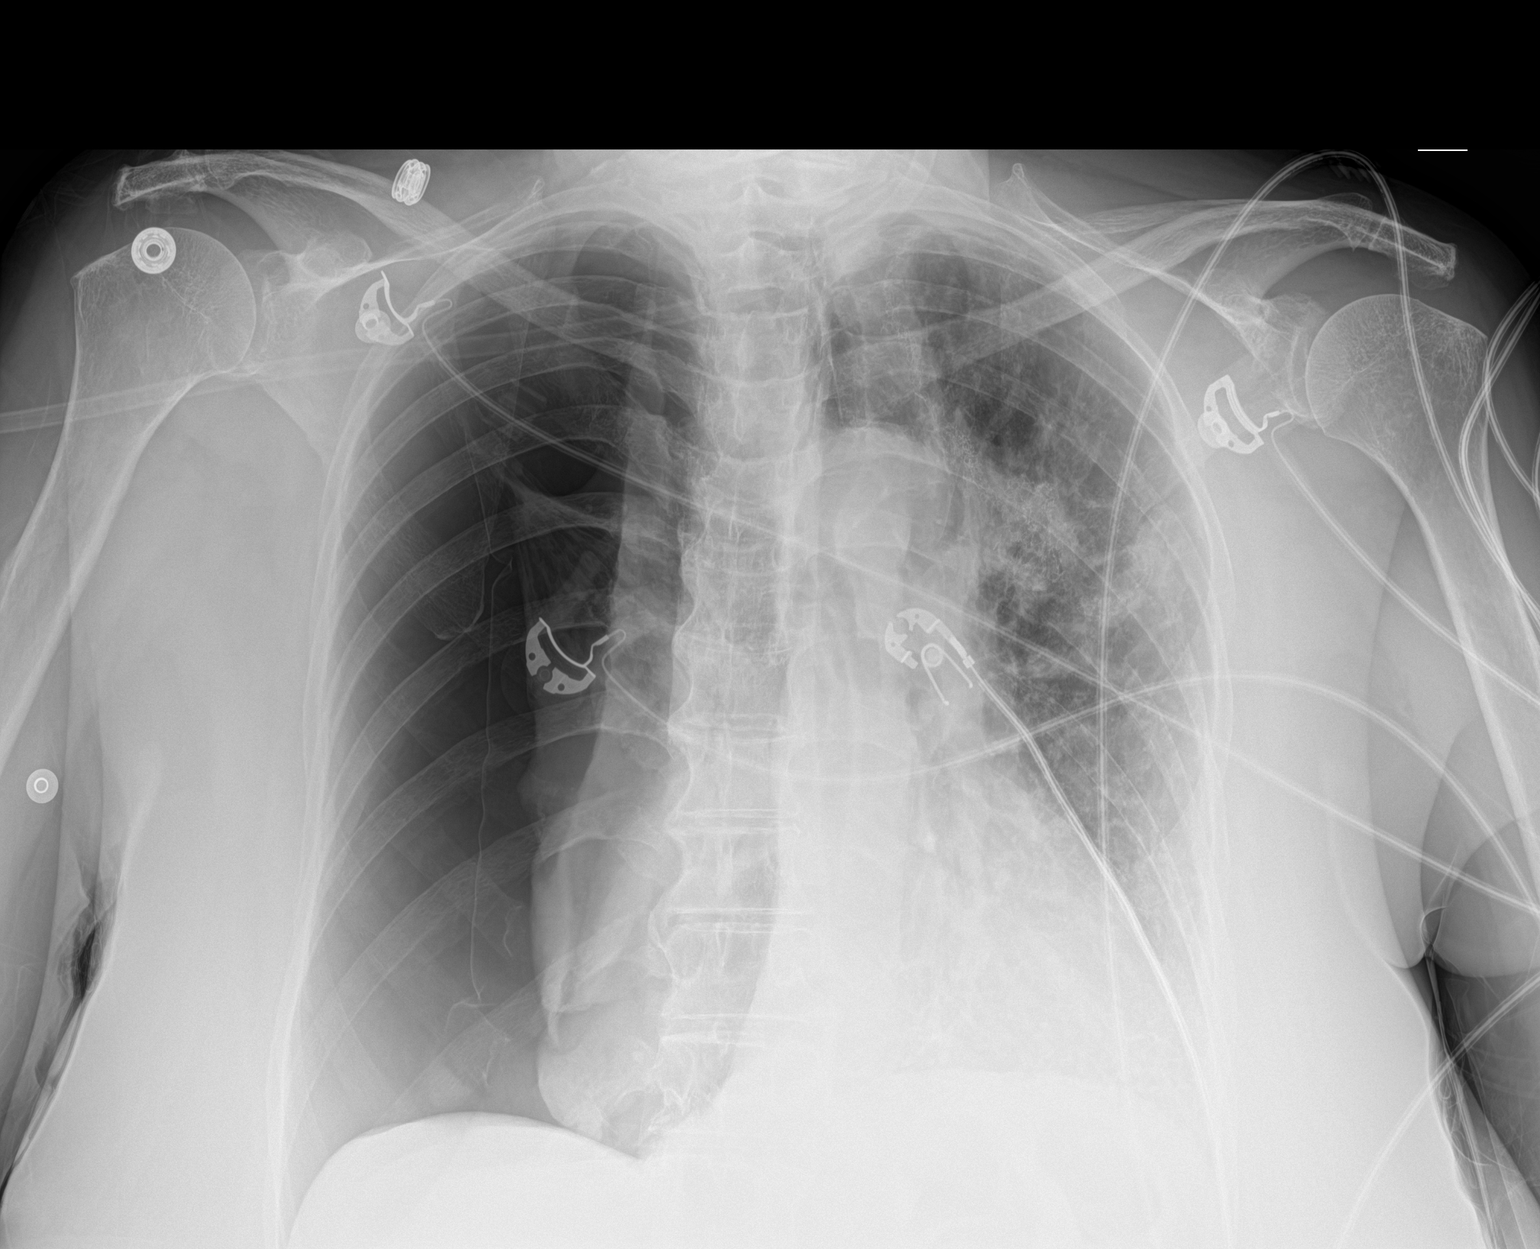

[1 of 1 positions shown; findings below may reference images not displayed]

FINDINGS: There is complete collapse of the right lung, aside from minimal
tethering near the apex, with leftward mediastinal shift, compatible
with tension pneumothorax.

Mild left-sided atelectasis is noted. Postoperative change and
opacity are again noted at the left upper lung zone. No pleural
effusion is seen.

The cardiomediastinal silhouette is normal in size. No acute osseous
abnormalities are identified.
IMPRESSION: Complete collapse of the right lung, with leftward mediastinal
shift, compatible with tension pneumothorax.

Critical Value/emergent results were called by telephone at the time
of interpretation on 01/22/2018 at [DATE] to Dr. SAVIO LOCKLEAR, who
verbally acknowledged these results.

## 2019-11-21 IMAGING — DX DG CHEST 1V PORT
1 series · 1 of 1 positions shown · non-contrast
Comparison: Yesterday.

CLINICAL DATA: Chest tube.

EXAM:
PORTABLE CHEST 1 VIEW

[chest ap]
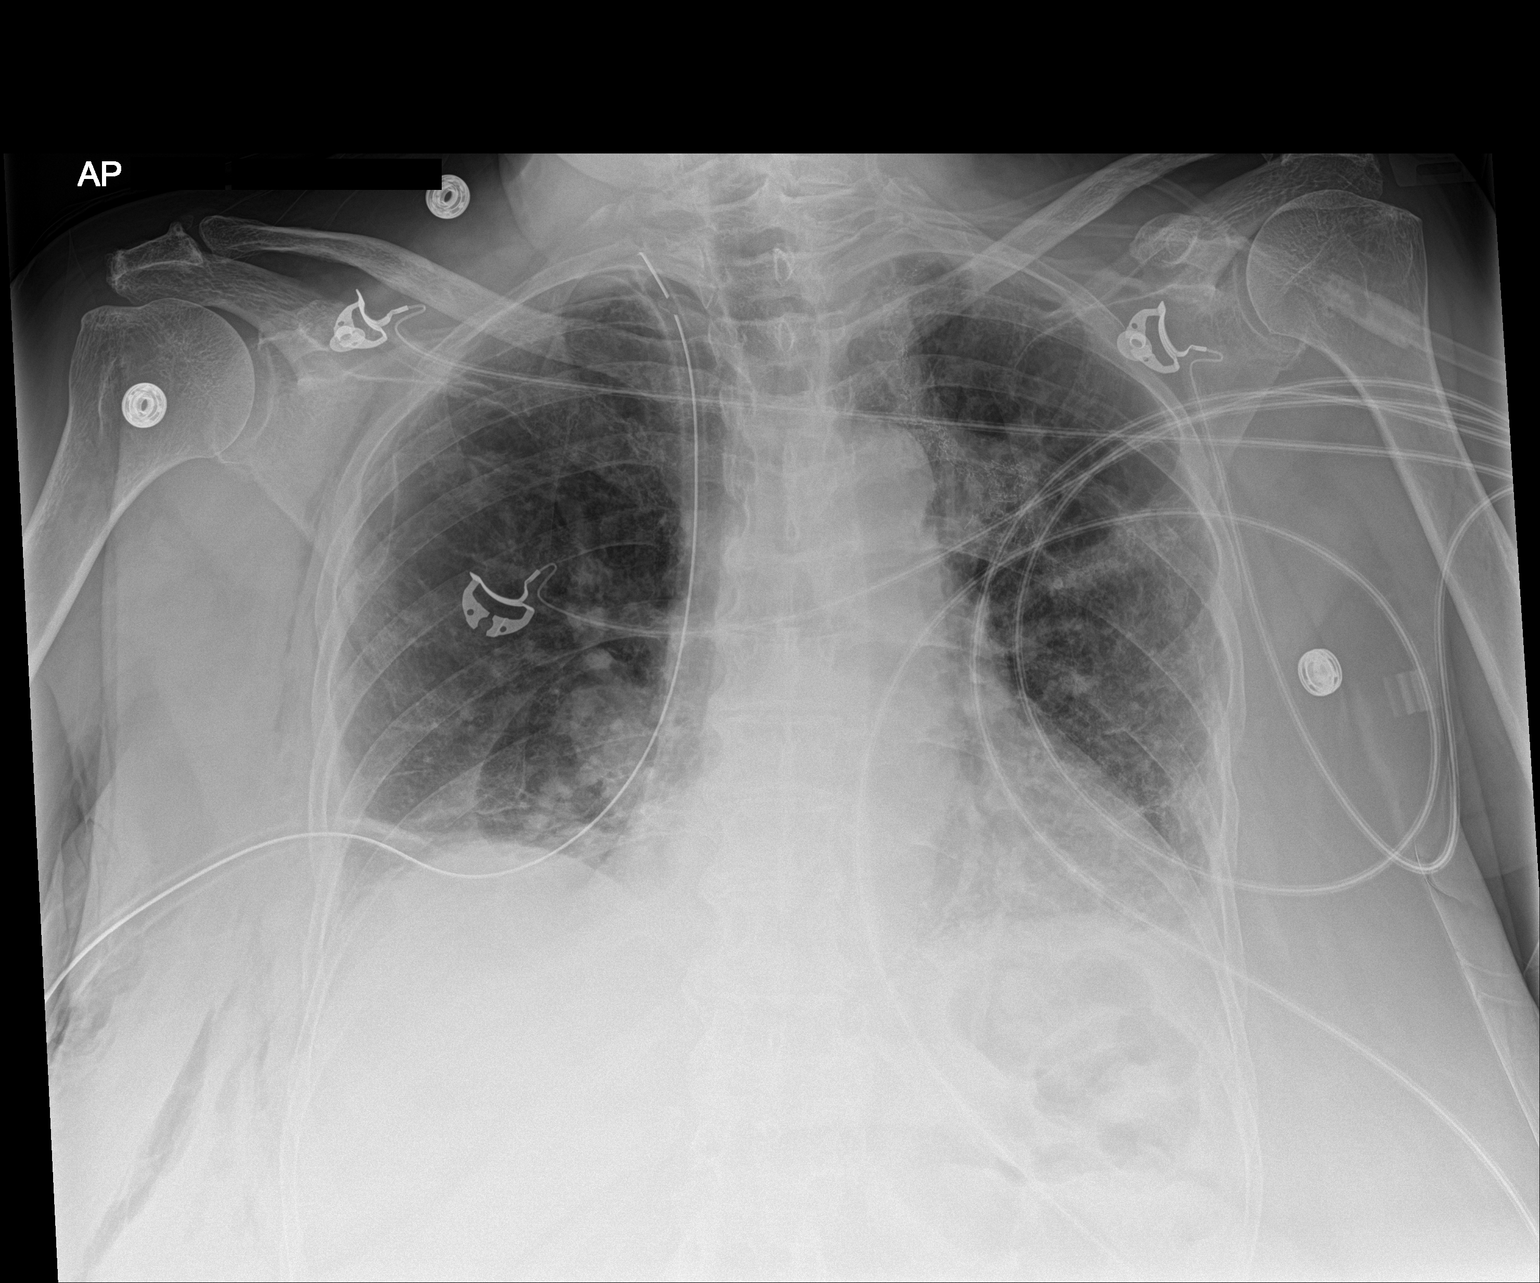

[1 of 1 positions shown; findings below may reference images not displayed]

FINDINGS: Stable right chest tube and approximately 5% right pneumothorax
compared to the CT yesterday. This is much smaller than on the
previous radiograph. Interval enlargement of the cardiac silhouette.
Stable prominence of the interstitial markings. Interval small
amount of linear density at the right lung base. Diffuse osteopenia.
Thoracic spine degenerative changes. Decreased right lateral
subcutaneous emphysema.
IMPRESSION: 1. Stable approximately 5% right pneumothorax with a right chest
tube in place.
2. Interval mild right basilar atelectasis.
3. Interval mild cardiomegaly.
4. Stable chronic interstitial lung disease.

## 2021-09-05 ENCOUNTER — Encounter (HOSPITAL_COMMUNITY): Payer: Self-pay | Admitting: Emergency Medicine

## 2021-09-05 ENCOUNTER — Telehealth: Payer: Self-pay

## 2021-09-05 ENCOUNTER — Other Ambulatory Visit: Payer: Self-pay

## 2021-09-05 ENCOUNTER — Emergency Department (HOSPITAL_COMMUNITY)
Admission: EM | Admit: 2021-09-05 | Discharge: 2021-09-09 | Disposition: A | Discharge status: Other Acute Inpatient Hospital | Payer: Self-pay | Attending: Emergency Medicine | Admitting: Emergency Medicine

## 2021-09-05 DIAGNOSIS — F0280 Dementia in other diseases classified elsewhere without behavioral disturbance: Secondary | ICD-10-CM | POA: Diagnosis present

## 2021-09-05 DIAGNOSIS — Z87898 Personal history of other specified conditions: Secondary | ICD-10-CM

## 2021-09-05 DIAGNOSIS — K922 Gastrointestinal hemorrhage, unspecified: Secondary | ICD-10-CM

## 2021-09-05 DIAGNOSIS — Z20822 Contact with and (suspected) exposure to covid-19: Secondary | ICD-10-CM | POA: Insufficient documentation

## 2021-09-05 DIAGNOSIS — N939 Abnormal uterine and vaginal bleeding, unspecified: Secondary | ICD-10-CM | POA: Insufficient documentation

## 2021-09-05 DIAGNOSIS — F22 Delusional disorders: Secondary | ICD-10-CM | POA: Insufficient documentation

## 2021-09-05 DIAGNOSIS — F29 Unspecified psychosis not due to a substance or known physiological condition: Secondary | ICD-10-CM | POA: Insufficient documentation

## 2021-09-05 DIAGNOSIS — R4182 Altered mental status, unspecified: Secondary | ICD-10-CM | POA: Insufficient documentation

## 2021-09-05 DIAGNOSIS — F0150 Vascular dementia without behavioral disturbance: Secondary | ICD-10-CM | POA: Diagnosis present

## 2021-09-05 DIAGNOSIS — F200 Paranoid schizophrenia: Secondary | ICD-10-CM | POA: Diagnosis present

## 2021-09-05 LAB — CBC WITH DIFFERENTIAL/PLATELET
Abs Immature Granulocytes: 0.02 10*3/uL (ref 0.00–0.07)
Basophils Absolute: 0 10*3/uL (ref 0.0–0.1)
Basophils Relative: 1 %
Eosinophils Absolute: 0.1 10*3/uL (ref 0.0–0.5)
Eosinophils Relative: 2 %
HCT: 38.7 % (ref 36.0–46.0)
Hemoglobin: 12.7 g/dL (ref 12.0–15.0)
Immature Granulocytes: 0 %
Lymphocytes Relative: 13 %
Lymphs Abs: 0.9 10*3/uL (ref 0.7–4.0)
MCH: 29.7 pg (ref 26.0–34.0)
MCHC: 32.8 g/dL (ref 30.0–36.0)
MCV: 90.6 fL (ref 80.0–100.0)
Monocytes Absolute: 0.5 10*3/uL (ref 0.1–1.0)
Monocytes Relative: 8 %
Neutro Abs: 5 10*3/uL (ref 1.7–7.7)
Neutrophils Relative %: 76 %
Platelets: 166 10*3/uL (ref 150–400)
RBC: 4.27 MIL/uL (ref 3.87–5.11)
RDW: 13.1 % (ref 11.5–15.5)
WBC: 6.6 10*3/uL (ref 4.0–10.5)
nRBC: 0 % (ref 0.0–0.2)

## 2021-09-05 LAB — URINALYSIS, ROUTINE W REFLEX MICROSCOPIC
Bacteria, UA: NONE SEEN
Bilirubin Urine: NEGATIVE
Glucose, UA: NEGATIVE mg/dL
Ketones, ur: NEGATIVE mg/dL
Nitrite: NEGATIVE
Protein, ur: NEGATIVE mg/dL
Specific Gravity, Urine: 1.01 (ref 1.005–1.030)
pH: 7 (ref 5.0–8.0)

## 2021-09-05 LAB — BASIC METABOLIC PANEL
Anion gap: 10 (ref 5–15)
BUN: 8 mg/dL (ref 8–23)
CO2: 24 mmol/L (ref 22–32)
Calcium: 8.6 mg/dL — ABNORMAL LOW (ref 8.9–10.3)
Chloride: 103 mmol/L (ref 98–111)
Creatinine, Ser: 0.86 mg/dL (ref 0.44–1.00)
GFR, Estimated: >60 mL/min (ref >60)
Glucose, Bld: 102 mg/dL — ABNORMAL HIGH (ref 70–99)
Potassium: 3.5 mmol/L (ref 3.5–5.1)
Sodium: 137 mmol/L (ref 135–145)

## 2021-09-05 LAB — POC OCCULT BLOOD, ED: Fecal Occult Bld: POSITIVE — AB

## 2021-09-05 LAB — RESP PANEL BY RT-PCR (FLU A&B, COVID) ARPGX2
Influenza A by PCR: NEGATIVE
Influenza B by PCR: NEGATIVE
SARS Coronavirus 2 by RT PCR: NEGATIVE

## 2021-09-05 MED ORDER — ACETAMINOPHEN 325 MG PO TABS
650.0000 mg | ORAL_TABLET | Freq: Once | ORAL | Status: AC
  Administered 2021-09-05: 650 mg via ORAL
  Filled 2021-09-05: qty 2
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
## 2021-09-05 NOTE — ED Notes (Signed)
Patient daughter, Kendriana Forston, updated about the status of the patient and plan of care.  ?

## 2021-09-05 NOTE — Discharge Instructions (Addendum)
Memory Care Facilities in [place], [place]  ?Also serving communities of [place]. ? ?[place] [facility] ?[address] [place], [place] [address] ?[phone] ?  ?[facility] ?[address] [place], [place] [address] ?[phone] ? ?[facility] ?[address] [place], [place] [address] ?[phone] ? ?[facility] ?[address], [place], [place] [address] ?[phone] ? ?[facility] ?[address] [place], [place] [address] ?[phone] ? ?[facility] ?[address] [place], [place] [address] ?[phone] ? ?[facility] ?[address] [place], [place] [address] ?[phone] ? ?[facility] ?[address] [place], [place] [address] ?[phone] ? ?[place] [facility] ?[address] [place], [place] [address] ?[phone] ? ?[address] [place], [place] [facility] ?[address], [place], [place] [address] ?[phone] ? ?[facility] [place] ?[address] [place], [place] [address] ?[phone] ? ?[facility] ?[address] [place], [place] [address] ?[phone] ? ? ?*The average cost of memory care in [place] is [phone] per month. This is higher than the national median of $5,395. Cheaper nearby regions include [place] with an average starting cost of $5,067. ? ? ? ? ? ? ?

## 2021-09-05 NOTE — Telephone Encounter (Signed)
RN case manager called requested a HFU for  pt being discharged today ws given 3/31 @ 9 :67 with Dr Posey Pronto  ?

## 2021-09-05 NOTE — ED Notes (Signed)
Daughter at bedside.

## 2021-09-05 NOTE — Discharge Planning (Signed)
Gwynevere Lizana J. Clydene Laming, [place], [place], [place] [phone] ? ?RNCM set up appointment with [facility] on 3/31 @ 0945.  Spoke with pt daughter at bedside and advised to please arrive 15 min early and take a picture ID and your current medications.   ?

## 2021-09-05 NOTE — Progress Notes (Addendum)
Inpatient Behavioral Health Gero Placement ? ?Pt meets inpatient criteria per Cliffton Asters, NP. There are no appropriate bed at [facility]. CSW sent [facility] notification requesting to review pt. CSW was informed by [facility] staff Rosalita Chessman. LCAS and there are no appropriate beds at this time at [facility]. Referral was sent to the following facilities;  ? ?Destination ?Service Provider Address Phone Fax  ?[facility]  [address] [place] [place] [address] [phone]  ?[facility]  [address] [place] [address] [phone]  ?[facility]  [address] [place] [address] [phone]  ?[facility]  [address] [place] [address] [phone]  ?[facility]  [address] [place] [address] [phone]  ?[facility]  [address], [place] [address] [phone]  ?[facility]  [address], [place] [address] [phone]  ?[facility]  [address] [place] [address] [place] [address] [phone]  ?[facility]  [address] [place] [address] [phone]  ?[facility]  [address] [place] [place] [address] [phone]  ?[facility]  [address] [place] [address] [phone]  ?[facility]  [address] [place], [place] [address] [phone]  ?[facility]  [address] [place] [address] [phone]  ? ? ?Situation ongoing,  CSW will follow up. ? ? ?Maryjean Ka, MSW, LCSWA ?09/05/2021  @ 9:54 PM ? ?

## 2021-09-05 NOTE — Progress Notes (Signed)
CSW spoke with patients family to inform them of patients PCP appointment next week and how to file for guardianship to place patient into a facility. CSW also told the family that they need to get patient insurance first. CSW suggested going to social services and applying for medicaid.  ?

## 2021-09-05 NOTE — ED Triage Notes (Signed)
Pt BIB EMS for vaginal bleeding that started today but is no longer occurring. Pt has both auditory and visual hallucinations with EMS stating that she has travelled all over [place] her [place] today  ? ? ?142/92 ?95% ?88NSR ?
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
## 2021-09-06 MED ORDER — QUETIAPINE FUMARATE 25 MG PO TABS
25.0000 mg | ORAL_TABLET | Freq: Every day | ORAL | Status: DC
  Filled 2021-09-06 (×2): qty 1

## 2021-09-06 MED ORDER — LORAZEPAM 2 MG/ML IJ SOLN: 1.0000 mg | Freq: Three times a day (TID) | INTRAMUSCULAR | Status: DC | PRN

## 2021-09-06 MED ORDER — LORAZEPAM 1 MG PO TABS: 1.0000 mg | ORAL_TABLET | Freq: Three times a day (TID) | ORAL | Status: DC | PRN

## 2021-09-06 NOTE — ED Provider Notes (Signed)
Emergency Medicine Observation Re-evaluation Note ? ?Carolyn Huynh is a 66 y.o. female, seen on rounds today.  Pt initially presented to the ED for complaints of Vaginal Bleeding and Hallucinations ?Currently, the patient is resting in room. ? ?Physical Exam  ?BP (!) 143/90 (BP Location: Right Arm)   Pulse 78   Temp 98.9 ?F (37.2 ?C) (Oral)   Resp 18   Ht 5\' 4"  (1.626 m)   Wt 47.2 kg   SpO2 99%   BMI 17.85 kg/m?  ?Physical Exam ?General: resting comfortably, NAD ?Lungs: normal WOB ?Psych: currently calm and resting ? ?ED Course / MDM  ?EKG:  ? ?I have reviewed the labs performed to date as well as medications administered while in observation.  Recent changes in the last 24 hours include none. ? ?Plan  ?Current plan is for gero psych admission. ? is not under involuntary commitment. ? ? ?  ?[name], DO ?09/06/21 1132 ? ?

## 2021-09-06 NOTE — ED Notes (Signed)
Patient lying quietly in the bed. Alert to self, states it is March 23. Unaware of where she is. Denies any needs at this time. ?
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
## 2021-09-06 NOTE — ED Notes (Signed)
Patient sitting on the side of the bed.inquires when lunch will be coming, told her 1230-1330. Denies any further needs. ?

## 2021-09-06 NOTE — ED Notes (Signed)
Updated daughter on pt and plan of care. ?

## 2021-09-06 NOTE — ED Notes (Signed)
Daughter Melvinia Ashby [phone] would like an update ?

## 2021-09-06 NOTE — ED Notes (Signed)
Patient ambulatory to the restroom without assistance. 
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
## 2021-09-06 NOTE — ED Notes (Signed)
Patient resting quietly in her bed, denies any needs at this time. ?

## 2021-09-07 NOTE — ED Provider Notes (Signed)
Emergency Medicine Observation Re-evaluation Note ? ?Carolyn Huynh is a 66 y.o. female, seen on rounds today.  Pt initially presented to the ED for complaints of Hallucinations ?Currently, the patient is sitting in room eating. ? ?Physical Exam  ?BP 136/87   Pulse 85   Temp 98.6 ?F (37 ?C) (Oral)   Resp 16   Ht 5\' 4"  (1.626 m)   Wt 47.2 kg   SpO2 100%   BMI 17.85 kg/m?  ?Physical Exam ?General: resting comfortably, NAD ?Lungs: normal WOB ?Psych: currently calm and resting ? ?ED Course / MDM  ?EKG:EKG Interpretation ? ?Date/Time:  Saturday September 06 2021 17:04:45 EDT ?Ventricular Rate:  71 ?PR Interval:  132 ?QRS Duration: 90 ?QT Interval:  400 ?QTC Calculation: 434 ?R Axis:   -44 ?Text Interpretation: rhythm indeterminate Left axis deviation Abnormal ECG No previous ECGs available Confirmed by 04-18-1975 (Zadie Rhine) on 09/07/2021 11:23:47 AM ? ?I have reviewed the labs performed to date as well as medications administered while in observation.  Recent changes in the last 24 hours include none. ? ?Plan  ?Current plan is for geri psych admission, TOC following. ? 09/09/2021 is not under involuntary commitment. ? ? ?  ?[name], DO ?09/07/21 1806 ? ?

## 2021-09-07 NOTE — Progress Notes (Signed)
CSW spoke with [facility] with [facility] who is requesting additional documentation of medical clearance. CSW faxed note stating medical clearance. ? ?Crissie Reese, MSW, LCSW-A, LCAS-A ?Phone: [phone] ?Disposition/TOC ? ?

## 2021-09-07 NOTE — ED Notes (Signed)
Patient in her room talking to no one, actively hallucinating. ?

## 2021-09-07 NOTE — ED Notes (Addendum)
Patient taking a shower. Bed linens changed.  ?

## 2021-09-07 NOTE — Progress Notes (Signed)
Per Elpidio Galea, patient meets criteria for inpatient treatment. There are no available or appropriate beds at [facility] today. CSW re-faxed referrals to the following facilities for review: ? ?[place] [address] [place] [address] [phone] --  ?[place] N/A [address], [place] [address] [id number] [phone] --  ?[place] [address] [place] [address] [phone] --  ?[place] N/A [address], [place] [id number] [phone] --  ?[facility]  Pending - Request Sent N/A [address] [place] [address] [phone] --  ?[place] [address] [place] [address] [phone] --  ?[facility]  Pending - Request Sent N/A [address] [place] [address] [phone] --  ?[place] N/A [address], [place] [address] [phone] --  ?[facility]  Pending - Request Sent N/A [place], [place] [address] [id number] --  ?[facility]  Pending - Request Sent N/A [address] [place] [address] [phone] --  ?[facility]  Pending - Request Sent N/A [address] [place] [address] [phone] --  ?[place] N/A [address] [place] [address] [id number] --  ?[place], [place] [address] [phone] --  ?[facility]  Pending - No Request Sent N/A [address] [place] [address] [phone] --  ?[facility]  Pending - No Request Sent N/A [address] [place], [place] [address] [phone] --  ?[facility]  Pending - No Request Sent N/A [address] [place] [address] [phone] --  ? ?TTS will continue to seek bed placement. ? ?Glennie Isle, MSW, LCSW-A, LCAS-A ?Phone: [phone] ?Disposition/TOC ? ?

## 2021-09-08 ENCOUNTER — Encounter (HOSPITAL_COMMUNITY): Payer: Self-pay | Admitting: Registered Nurse

## 2021-09-08 DIAGNOSIS — F0280 Dementia in other diseases classified elsewhere without behavioral disturbance: Secondary | ICD-10-CM | POA: Diagnosis present

## 2021-09-08 DIAGNOSIS — F0150 Vascular dementia without behavioral disturbance: Secondary | ICD-10-CM | POA: Diagnosis present

## 2021-09-08 DIAGNOSIS — F200 Paranoid schizophrenia: Secondary | ICD-10-CM

## 2021-09-08 DIAGNOSIS — Z87898 Personal history of other specified conditions: Secondary | ICD-10-CM

## 2021-09-08 LAB — CBC WITH DIFFERENTIAL/PLATELET
Abs Immature Granulocytes: 0.01 10*3/uL (ref 0.00–0.07)
Basophils Absolute: 0.1 10*3/uL (ref 0.0–0.1)
Basophils Relative: 1 %
Eosinophils Absolute: 0.4 10*3/uL (ref 0.0–0.5)
Eosinophils Relative: 7 %
HCT: 39.3 % (ref 36.0–46.0)
Hemoglobin: 12.5 g/dL (ref 12.0–15.0)
Immature Granulocytes: 0 %
Lymphocytes Relative: 19 %
Lymphs Abs: 1.2 10*3/uL (ref 0.7–4.0)
MCH: 29.2 pg (ref 26.0–34.0)
MCHC: 31.8 g/dL (ref 30.0–36.0)
MCV: 91.8 fL (ref 80.0–100.0)
Monocytes Absolute: 0.5 10*3/uL (ref 0.1–1.0)
Monocytes Relative: 7 %
Neutro Abs: 4.1 10*3/uL (ref 1.7–7.7)
Neutrophils Relative %: 66 %
Platelets: 328 10*3/uL (ref 150–400)
RBC: 4.28 MIL/uL (ref 3.87–5.11)
RDW: 13.2 % (ref 11.5–15.5)
WBC: 6.2 10*3/uL (ref 4.0–10.5)
nRBC: 0 % (ref 0.0–0.2)

## 2021-09-08 LAB — RESP PANEL BY RT-PCR (FLU A&B, COVID) ARPGX2
Influenza A by PCR: NEGATIVE
Influenza B by PCR: NEGATIVE
SARS Coronavirus 2 by RT PCR: NEGATIVE

## 2021-09-08 MED ORDER — PALIPERIDONE ER 3 MG PO TB24
3.0000 mg | ORAL_TABLET | Freq: Every day | ORAL | Status: DC
  Filled 2021-09-08 (×2): qty 1

## 2021-09-08 NOTE — ED Notes (Signed)
pt laying in bed turing her head to the R and states shes on the phone with her son. no phone present. 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 ?

## 2021-09-08 NOTE — ED Notes (Signed)
Tele-psych at bedside.

## 2021-09-08 NOTE — ED Notes (Addendum)
Pt accepted to [facility] by MD Marlou Porch to room L 30. RN gave report to [employer] ?

## 2021-09-08 NOTE — ED Notes (Addendum)
RN went in room to have pt sign voluntary paperwork for facility. Pt refused. Pt stated "if I leave here im going home" MD Effie Shy notified awaiting IVC paperwork ?
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
## 2021-09-08 NOTE — ED Provider Notes (Signed)
Emergency Medicine Observation Re-evaluation Note ? ?Carolyn Huynh is a 66 y.o. female, seen on rounds today.  Pt initially presented to the ED for complaints of Hallucinations ?Currently, the patient is resting in bed. ? ?Physical Exam  ?BP (!) 149/92   Pulse 77   Temp 98.2 ?F (36.8 ?C)   Resp 16   Ht 5\' 4"  (1.626 m)   Wt 47.2 kg   SpO2 99%   BMI 17.85 kg/m?  ?Physical Exam ?General: Nontoxic appearing ?Cardiac: Normal heart rate ?Lungs: Normal respiratory rate ?Psych: No internal responsiveness when evaluated at 1730 p.m. ? ?ED Course / MDM  ?EKG:EKG Interpretation ? ?Date/Time:  Saturday September 06 2021 17:04:45 EDT ?Ventricular Rate:  71 ?PR Interval:  132 ?QRS Duration: 90 ?QT Interval:  400 ?QTC Calculation: 434 ?R Axis:   -44 ?Text Interpretation: rhythm indeterminate Left axis deviation Abnormal ECG No previous ECGs available Confirmed by 04-18-1975 (Zadie Rhine) on 09/07/2021 11:23:47 AM ? ?I have reviewed the labs performed to date as well as medications administered while in observation.  Recent changes in the last 24 hours include she has been intermittently hallucinating, and refusing to take medications.  She has declined to take medicines because they were not given to her by her doctor.  She is defensive and seems paranoid.  Patient was delusional when talking to psychiatry today.  She requires inpatient psychiatry treatment.  I carried out involuntary commitment.  CBC repeated today and is improved from 3 days ago.  Doubt active GI bleed at this time.  She remains EPIC is cleared for treatment by psychiatry. ? ?Plan  ?Current plan is for admission to psychiatric facility. ? 09/09/2021 is  under involuntary commitment. ? ? ?  ?Carolyn Rice, MD ?09/08/21 1751 ? ?

## 2021-09-08 NOTE — ED Notes (Signed)
ED Provider at bedside. 

## 2021-09-08 NOTE — ED Notes (Signed)
Pt refusing meds. Pt stated "I only take medicine from my doctor" RN encouraged pt about meds, and what MD requesting. Pt becoming irritated and raising voice and stated "I aint taking them". RN hold med and will try again later  ?

## 2021-09-08 NOTE — ED Notes (Signed)
RN tried to call pts daughter, no answer  ?

## 2021-09-08 NOTE — Progress Notes (Signed)
[facility] LCSW Progress Note ?  ?09/08/2021    2:24 PM ? ?Carolyn Huynh  ? ?[id number]  ? ?Type of Contact and Topic:  Psychiatric Bed Placement  ? ?Pt accepted to [facility] L30   ? ?Patient meets inpatient criteria per Darrol Angel, NP  ? ?The attending provider will be Dr. Louis Meckel ? ?Call report to [phone] ? ?Marilu Favre, RN @ [facility] ED notified.    ? ?Pt scheduled  to arrive at [place].  ? ? ?Mariea Clonts, MSW, LCSW-A  ?2:25 PM 09/08/2021   ?  ? ?  ?  ? ? ? ? ?  ?

## 2021-09-09 ENCOUNTER — Other Ambulatory Visit: Payer: Self-pay

## 2021-09-09 ENCOUNTER — Inpatient Hospital Stay
Admission: AD | Admit: 2021-09-09 | Discharge: 2021-09-29 | DRG: 885 | Disposition: A | Discharge status: Home / Self Care | Payer: 59 | Source: Intra-hospital | Attending: Psychiatry | Admitting: Psychiatry

## 2021-09-09 ENCOUNTER — Encounter: Payer: Self-pay | Admitting: Psychiatry

## 2021-09-09 DIAGNOSIS — Z79899 Other long term (current) drug therapy: Secondary | ICD-10-CM

## 2021-09-09 DIAGNOSIS — F312 Bipolar disorder, current episode manic severe with psychotic features: Secondary | ICD-10-CM | POA: Diagnosis present

## 2021-09-09 DIAGNOSIS — F0152 Vascular dementia, unspecified severity, with psychotic disturbance: Secondary | ICD-10-CM | POA: Diagnosis present

## 2021-09-09 DIAGNOSIS — F22 Delusional disorders: Secondary | ICD-10-CM | POA: Diagnosis present

## 2021-09-09 DIAGNOSIS — J449 Chronic obstructive pulmonary disease, unspecified: Secondary | ICD-10-CM | POA: Diagnosis present

## 2021-09-09 DIAGNOSIS — F200 Paranoid schizophrenia: Principal | ICD-10-CM | POA: Diagnosis present

## 2021-09-09 DIAGNOSIS — Z20822 Contact with and (suspected) exposure to covid-19: Secondary | ICD-10-CM | POA: Diagnosis present

## 2021-09-09 DIAGNOSIS — I10 Essential (primary) hypertension: Secondary | ICD-10-CM | POA: Diagnosis present

## 2021-09-09 DIAGNOSIS — Z91199 Patient's noncompliance with other medical treatment and regimen due to unspecified reason: Secondary | ICD-10-CM

## 2021-09-09 MED ORDER — MAGNESIUM HYDROXIDE 400 MG/5ML PO SUSP: 30.0000 mL | Freq: Every day | ORAL | Status: DC | PRN

## 2021-09-09 MED ORDER — PALIPERIDONE ER 3 MG PO TB24
3.0000 mg | ORAL_TABLET | Freq: Every day | ORAL | Status: DC
  Filled 2021-09-09: qty 1

## 2021-09-09 MED ORDER — QUETIAPINE FUMARATE 25 MG PO TABS
25.0000 mg | ORAL_TABLET | Freq: Every day | ORAL | Status: DC
  Filled 2021-09-09: qty 1

## 2021-09-09 MED ORDER — LORAZEPAM 2 MG/ML IJ SOLN: 1.0000 mg | Freq: Three times a day (TID) | INTRAMUSCULAR | Status: DC | PRN

## 2021-09-09 MED ORDER — TRAZODONE HCL 100 MG PO TABS
100.0000 mg | ORAL_TABLET | Freq: Every evening | ORAL | Status: DC | PRN
  Filled 2021-09-09 (×2): qty 1

## 2021-09-09 MED ORDER — ALUM & MAG HYDROXIDE-SIMETH 200-200-20 MG/5ML PO SUSP: 30.0000 mL | ORAL | Status: DC | PRN

## 2021-09-09 MED ORDER — LORAZEPAM 1 MG PO TABS
1.0000 mg | ORAL_TABLET | Freq: Three times a day (TID) | ORAL | Status: DC | PRN
Start: 2021-09-09 — End: 2021-09-29

## 2021-09-09 NOTE — ED Notes (Addendum)
Room checked for contraband. Patient sitting up on side of the bed, calm and cooperative. Denies any needs at this time. ?

## 2021-09-09 NOTE — ED Notes (Signed)
Call to sheriff ?

## 2021-09-09 NOTE — ED Notes (Signed)
Patient's daughter called to make aware that she was being sent to [facility] today. ?

## 2021-09-09 NOTE — Tx Team (Signed)
Initial Treatment Plan ?09/09/2021 ?6:25 PM ?Carolyn Huynh ?[id number] ? ? ? ?PATIENT STRESSORS: ?Other: Does not know why she is here.   ? ? ?PATIENT STRENGTHS: ?Supportive family/friends  ? ? ?PATIENT IDENTIFIED PROBLEMS: ?States her issues are revolved around her COPD  ?  ?  ?  ?  ?  ?  ?  ?  ?  ? ?DISCHARGE CRITERIA:  ?Ability to meet basic life and health needs ?Improved stabilization in mood, thinking, and/or behavior ? ?PRELIMINARY DISCHARGE PLAN: ?Return to previous living arrangement ? ?PATIENT/FAMILY INVOLVEMENT: ?This treatment plan has been presented to and reviewed with the patient, Carolyn Huynh.  The patient has been given the opportunity to ask questions and make suggestions. ? ?Nolon Bussing, RN ?09/09/2021, 6:25 PM ?

## 2021-09-09 NOTE — Progress Notes (Signed)
Pt was accepted to [facility] This CSW will request that 1st shift follow up due to IVC being in process. And due to IVC being processed late which caused a delay for transport placement was delayed. CSW to assist and follow. ? ?Maryjean Ka, MSW, LCSWA ?09/09/2021 12:10 AM ? ? ?

## 2021-09-09 NOTE — ED Provider Notes (Signed)
Emergency Medicine Observation Re-evaluation Note ? ?Carolyn Huynh is a 66 y.o. female, seen on rounds today.  Pt initially presented to the ED for complaints of Hallucinations ?Currently, the patient is upright awake, alert, speaking clearly, though with grossly evident delusions. ? ?Physical Exam  ?BP 138/80   Pulse 79   Temp 97.9 ?F (36.6 ?C) (Oral)   Resp 16   Ht 5\' 4"  (1.626 m)   Wt 47.2 kg   SpO2 99%   BMI 17.85 kg/m?  ?Physical Exam ?General: No distress ?Cardiac: Regular rate and rhythm ?Lungs: No increased work of breathing ?Psych: Delusional, but pleasant ? ?ED Course / MDM  ?EKG:EKG Interpretation ? ?Date/Time:  Saturday September 06 2021 17:04:45 EDT ?Ventricular Rate:  71 ?PR Interval:  132 ?QRS Duration: 90 ?QT Interval:  400 ?QTC Calculation: 434 ?R Axis:   -44 ?Text Interpretation: rhythm indeterminate Left axis deviation Abnormal ECG No previous ECGs available Confirmed by 04-18-1975 (Zadie Rhine) on 09/07/2021 11:23:47 AM ? ?I have reviewed the labs performed to date as well as medications administered while in observation.  Recent changes in the last 24 hours include acceptance to affiliated behavioral health facility, Dr. 09/09/2021 is the accepting physician at [facility]. ? ?Plan  ?Current plan is for transfer to [facility]. ?[facility] is under involuntary commitment. ?  ? ?  ?[name], MD ?09/09/21 1004 ? ?

## 2021-09-09 NOTE — Progress Notes (Signed)
Patient admitted IVC  to the [facility] unit from [facility] ED with diagnosis of delusional thoughts and paranoia. Patient presents to unit  ambulatory, A&Ox2.  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 Patient also stated " I'm white under the mask". Patient is delusional. Patient denies SI, HI, AVH, anxiety and depression. Patient stated " I don't know why I'm here my daughter called EMS to pick me up because I'm sick, I got COPD, respiratory infection".  Patient  denies smoking or drug abuse and ETOH use. Emotional support and reassurance provided throughout admission intake. Afterwards, oriented patient to unit, room and call light, reviewed POC with all questions answered and understanding verbalized. Body assessment done patient observed with dry scaly skin, and long and thick toenails. Denies any needs at this time. Currently sitting in the day room among peers eating supper.  Will continue to monitor with ongoing Q 15 minute safety checks per unit protocol. ?

## 2021-09-09 NOTE — ED Notes (Signed)
Called [place] [employer] to arrange transport to [facility] ?

## 2021-09-09 NOTE — ED Notes (Signed)
Pt reports she did not want to take 10AM meds. ?

## 2021-09-10 DIAGNOSIS — F312 Bipolar disorder, current episode manic severe with psychotic features: Secondary | ICD-10-CM

## 2021-09-10 LAB — URINE CULTURE: Culture: >=100000 — AB

## 2021-09-10 MED ORDER — OLANZAPINE 5 MG PO TBDP
10.0000 mg | ORAL_TABLET | Freq: Every day | ORAL | Status: DC
  Filled 2021-09-10: qty 2

## 2021-09-10 NOTE — H&P (Signed)
Psychiatric Admission Assessment Adult ? ?Patient Identification: Carolyn Riceorothy A Huynh ?MRN:  [id number] ?Date of Evaluation:  09/10/2021 ?Chief Complaint:  Severe manic bipolar 1 disorder with psychotic behavior (HCC) [F31.2] ?Principal Diagnosis: Severe manic bipolar 1 disorder with psychotic behavior (HCC) ?Diagnosis:  Principal Problem: ?  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 ?History of Present Illness: Nicole CellaDorothy is a 66 year old African-American female with a long history of schizophrenia, delusional disorder, and alcohol abuse.  She presented to [facility] emergency room with her daughter stating that she was not caring for herself, not eating and drinking, losing weight and making statements like she is a doctor of internal medicine etc. during our interview she is pleasant and cooperative but quite delusional.  According to the notes she had not been drinking.  She denies depression.  She denies auditory or visual hallucinations although she is seen talking to people that are not there.  She has a history of auditory hallucinations.  She denies suicidal ideation or any past suicide attempts. ? ?Carolyn RiceDorothy A Huynh is a 66 y.o. year-old female presents to the ED with chief complaint of vaginal bleeding and dementia.  She is accompanied by her daughter.  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  Daughter reports that the symptoms have been gradually worsening over the past year or so.  Daughter does not think that she can take care of herself.  She currently lives at home with daughter.  Daughter reports that she has been not eating or drinking ? ?Associated Signs/Symptoms: ?Depression Symptoms:  Denied ?Duration of Depression Symptoms: No data recorded ?(Hypo) Manic Symptoms:  Delusions, ?Anxiety Symptoms:   None ?Psychotic Symptoms:  Delusions, ?Hallucinations:  Auditory ?Visual ?Ideas of Reference, ?PTSD Symptoms: ?NA ?Total Time spent with patient: 1 hour ? ?Past Psychiatric History: As above ? ?Is the patient at risk to self? Yes.    ?Has the patient been a risk to self in the past 6 months? Yes.    ?Has the patient been a risk to self within the distant past? Yes.    ?Is the patient a risk to others? No.  ?Has the patient been a risk to others in the past 6 months? No.  ?Has the patient been a risk to others within the distant past? No.  ? ?Prior Inpatient Therapy:   ?Prior Outpatient Therapy:   ? ?Alcohol Screening: 1. How often do you have a drink containing alcohol?: Never ?2. How many drinks containing alcohol do you have on a typical day when you are drinking?: 1 or 2 ?3. How often do you have six or more drinks on one occasion?: Never ?AUDIT-C Score: 0 ?4. How often during the last year have you found that you were not able to stop drinking once you had started?: Never ?5. How often during the last year have you failed to do what was normally expected from you because of drinking?: Never ?6. How often during the last year have you needed a first drink in the morning to get yourself going after a heavy drinking session?: Never ?7. How often during the last year have you had a feeling of guilt of remorse after drinking?: Never ?8. How often during the last year have you been unable to remember what happened the night before because you had been drinking?: Never ?9. Have you or someone else  been injured as a result of your drinking?: No ?10. Has a relative or friend or a doctor or another health worker been concerned about your drinking or suggested you cut down?: No ?Alcohol Use Disorder Identification Test Final Score (AUDIT): 0 ?Substance Abuse History in the last 12 months:  Yes.   ?Consequences of Substance Abuse: ?NA ?Previous Psychotropic Medications: Yes  ?Psychological Evaluations: No  ?Past Medical History:  ?Past Medical History:  ?Diagnosis Date  ? Asthma    ? COPD (chronic obstructive pulmonary disease) (HCC)   ? Dementia (HCC)   ? Hypertension   ? Pneumothorax   ?  ?Past Surgical History:  ?Procedure Laterality Date  ? CHEST TUBE INSERTION    ? VIDEO ASSISTED THORACOSCOPY Right 02/01/2018  ? Procedure: VIDEO ASSISTED THORACOSCOPY, BLEB STAPLING;  Surgeon: Kerin Perna, MD;  Location: [facility] OR;  Service: Thoracic;  Laterality: Right;  ? ?Family History: History reviewed. No pertinent family history. ?Family Psychiatric  History: Unknown ?Tobacco Screening:   ?Social History:  ?Social History  ? ?Substance and Sexual Activity  ?Alcohol Use Not Currently  ?   ?Social History  ? ?Substance and Sexual Activity  ?Drug Use Never  ?  ?Additional Social History: ?  ?   ?  ?  ?  ?  ?  ?  ?  ?  ?  ?  ? ?Allergies:   ?Allergies  ?Allergen Reactions  ? Aspirin Shortness Of Breath  ? Shellfish Allergy Anaphylaxis  ? Ivp Dye [Iodinated Contrast Media] Other (See Comments)  ?  Chest pain  ? ?Lab Results:  ?Results for orders placed or performed during the hospital encounter of 09/05/21 (from the past 48 hour(s))  ?Urine Culture     Status: Abnormal  ? Collection Time: 09/08/21 11:09 AM  ? Specimen: Urine, Clean Catch  ?Result Value Ref Range  ? Specimen Description URINE, CLEAN CATCH   ? Special Requests NONE   ? Culture (A)   ?  >=100,000 COLONIES/mL DIPHTHEROIDS(CORYNEBACTERIUM SPECIES) ?Standardized susceptibility testing for this organism is not available. ?Performed at [facility], [address] [place], [place] [address] ?  ? Report Status 09/10/2021 FINAL   ?Resp Panel by RT-PCR (Flu A&B, Covid) Urine, Clean Catch     Status: None  ? Collection Time: 09/08/21 12:14 PM  ? Specimen: Urine, Clean Catch; Nasopharyngeal(NP) swabs in vial transport medium  ?Result Value Ref Range  ? SARS Coronavirus 2 by RT PCR NEGATIVE NEGATIVE  ?  Comment: (NOTE) ?SARS-CoV-2 target nucleic acids are NOT DETECTED. ? ?The SARS-CoV-2 RNA is generally detectable in upper  respiratory ?specimens during the acute phase of infection. The lowest ?concentration of SARS-CoV-2 viral copies this assay can detect is ?138 copies/mL. A negative result does not preclude SARS-Cov-2 ?infection and should not be used as the sole basis for treatment or ?other patient management decisions. A negative result may occur with  ?improper specimen collection/handling, submission of specimen other ?than nasopharyngeal swab, presence of viral mutation(s) within the ?areas targeted by this assay, and inadequate number of viral ?copies(<138 copies/mL). A negative result must be combined with ?clinical observations, patient history, and epidemiological ?information. The expected result is Negative. ? ?Fact Sheet for Patients:  ?BloggerCourse.com ? ?Fact Sheet for Healthcare Providers:  ?SeriousBroker.it ? ?This test is no t yet approved or cleared by the [place] FDA and  ?has been authorized for detection and/or diagnosis of SARS-CoV-2 by ?FDA under an Emergency Use Authorization (EUA). This EUA will remain  ?  in effect (meaning this test can be used) for the duration of the ?COVID-19 declaration under Section 564(b)(1) of the Act, 21 ?U.S.C.section 360bbb-3(b)(1), unless the authorization is terminated  ?or revoked sooner.  ? ? ?  ? Influenza A by PCR NEGATIVE NEGATIVE  ? Influenza B by PCR NEGATIVE NEGATIVE  ?  Comment: (NOTE) ?The Xpert Xpress SARS-CoV-2/FLU/RSV plus assay is intended as an aid ?in the diagnosis of influenza from Nasopharyngeal swab specimens and ?should not be used as a sole basis for treatment. Nasal washings and ?aspirates are unacceptable for Xpert Xpress SARS-CoV-2/FLU/RSV ?testing. ? ?Fact Sheet for Patients: ?BloggerCourse.com ? ?Fact Sheet for Healthcare Providers: ?SeriousBroker.it ? ?This test is not yet approved or cleared by the [place] FDA and ?has been authorized for  detection and/or diagnosis of SARS-CoV-2 by ?FDA under an Emergency Use Authorization (EUA). This EUA will remain ?in effect (meaning this test can be used) for the duration of the ?COVID-19 declaration under Section 564(b)(1) of

## 2021-09-10 NOTE — Progress Notes (Signed)
Pt lying in bed with eyes closed; easily aroused when name called . Pt states that she feels "okay". She currently denies pain, Si/HI/AVH, anxiety and depression. She reports that she does not have any trouble sleeping and describes her appetites as "in the middle" and confirmed that her appetite is fair. No acute distress noted. ?

## 2021-09-10 NOTE — Progress Notes (Signed)
Recreation Therapy Notes ? ?  ?Date: 09/10/2021 ?  ?Time: 1:15pm  ?  ?Location: Craft room   ?  ?Behavioral response: N/A ?  ?Intervention Topic: Self-care ? ?Discussion/Intervention: ?Patient refused to attend group. ?  ?Clinical Observations/Feedback: ?Patient refused to attend group. ?  ?Santasia Rew LRT/CTRS ? ? ? ? ? ? ? ?Staci Carver ?09/10/2021 3:07 PM ?

## 2021-09-10 NOTE — BHH Suicide Risk Assessment (Signed)
[facility] Admission Suicide Risk Assessment ? ? ?Nursing information obtained from:  Patient ?Demographic factors:  Age 66 or older ?Current Mental Status:  NA ?Loss Factors:  NA ?Historical Factors:  NA ?Risk Reduction Factors:  NA ? ?Total Time spent with patient: 1 hour ?Principal Problem: Severe manic bipolar 1 disorder with psychotic behavior (HCC) ?Diagnosis:  Principal Problem: ?  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 ?Subjective Data: Carolyn Huynh is a 66 y.o. year-old female presents to the ED with chief complaint of vaginal bleeding and dementia.  She is accompanied by her daughter.  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  Daughter reports that the symptoms have been gradually worsening over the past year or so.  Daughter does not think that she can take care of herself.  She currently lives at home with daughter.  Daughter reports that she has been not eating or drinking ? ?Continued Clinical Symptoms:  ?Alcohol Use Disorder Identification Test Final Score (AUDIT): 0 ?The "Alcohol Use Disorders Identification Test", Guidelines for Use in Primary Care, Second Edition.  World Science writer [facility]). ?Score between 0-7:  no or low risk or alcohol related problems. ?Score between 8-15:  moderate risk of alcohol related problems. ?Score between 16-19:  high risk of alcohol related problems. ?Score 20 or above:  warrants further diagnostic evaluation for alcohol dependence and treatment. ? ? ?CLINICAL FACTORS:  ? Currently Psychotic ? ? ?Musculoskeletal: ?Strength & Muscle Tone: within normal limits ?Gait & Station: normal ?Patient leans: N/A ? ?Psychiatric Specialty Exam: ? ?Presentation  ?General Appearance: Appropriate for Environment ? ?Eye Contact:Good ? ?Speech:Clear and Coherent; Normal Rate ? ?Speech Volume:Normal ? ?Handedness:Right ? ? ?Mood and  Affect  ?Mood:Euthymic ? ?Affect:Congruent ? ? ?Thought Process  ?Thought Processes:Coherent; Disorganized ? ?Descriptions of Associations:Loose ? ?Orientation:Partial (self and place) ? ?Thought Content:Delusions ? ?History of Schizophrenia/Schizoaffective disorder:No ? ?Duration of Psychotic Symptoms:N/A ? ?Hallucinations:No data recorded ?Ideas of Reference:Delusions ? ?Suicidal Thoughts:No data recorded ?Homicidal Thoughts:No data recorded ? ?Sensorium  ?Memory:Immediate Poor; Remote Poor; Recent Poor ? ?Judgment:Impaired ? ?Insight:Poor ? ? ?Executive Functions  ?Concentration:Fair ? ?Attention Span:Fair ? ?Recall:Poor ? ?Fund of Knowledge:Fair ? ?Language:Good ? ? ?Psychomotor Activity  ?Psychomotor Activity:No data recorded ? ?Assets  ?Assets:Communication Skills; Desire for Improvement; Housing; Social Support ? ? ?Sleep  ?Sleep:No data recorded ? ? ?Physical Exam: ?Physical Exam ?ROS ?Blood pressure (!) 151/80, pulse 82, temperature (!) 97.4 ?F (36.3 ?C), resp. rate 20, height 5\' 2"  (1.575 m), weight 47.2 kg, SpO2 97 %. Body mass index is 19.02 kg/m?. ? ? ?COGNITIVE FEATURES THAT CONTRIBUTE TO RISK:  ?Loss of executive function   ? ?SUICIDE RISK:  ? Minimal: No identifiable suicidal ideation.  Patients presenting with no risk factors but with morbid ruminations; may be classified as minimal risk based on the severity of the depressive symptoms ? ?PLAN OF CARE: See orders ? ?I certify that inpatient services furnished can reasonably be expected to improve the patient's condition.  ? ? , DO ?09/10/2021, 10:30 AM ? ?

## 2021-09-10 NOTE — BHH Counselor (Signed)
CSW made attempt to complete PSA with pt. CSW will make second attempt to complete PSA at another time.  ? ?Susette Seminara [place], MSW, LCSW-A ?3/29/20234:56 PM  ?

## 2021-09-10 NOTE — Progress Notes (Signed)
Recreation Therapy Notes ? ?INPATIENT RECREATION THERAPY ASSESSMENT ? ?Patient Details ?Name: Carolyn Huynh ?MRN: [id number] ?DOB: 1956/05/11 ?Today's Date: 09/10/2021 ?      ?Information Obtained From: ?Patient ? ?Able to Participate in Assessment/Interview: ?Yes ? ?Patient Presentation: ?Responsive ? ?Reason for Admission (Per Patient): ?Active Symptoms ? ?Patient Stressors: ?  ? ?Coping Skills:   ?Isolation, Talk, Art, Avoidance ? ?Leisure Interests (2+):  ?Games - Jig-saw puzzles, Music - Listen, Individual - TV, Sports - [employer], Sports - Basketball, Sports - Exercise (Comment) Architect) ? ?Frequency of Recreation/Participation: ?Monthly ? ?Awareness of Community Resources:  ?Yes ? ?Community Resources:  ?Park ? ?Current Use: ?Yes ? ?If no, Barriers?: ?  ? ?Expressed Interest in [employer] Information: ?  ? ?[place] of Residence:  ?[place] ? ?Patient Main Form of Transportation: ?Car ? ?Patient Strengths:  ?Helping people ? ?Patient Identified Areas of Improvement:  ?Money ? ?Patient Goal for Hospitalization:  ?Get better to go home to my family ? ?Current SI (including self-harm):  ?No ? ?Current HI:  ?No ? ?Current AVH: ?No ? ?Staff Intervention Plan: ?Group Attendance, Collaborate with Interdisciplinary Treatment Team ? ?Consent to Intern Participation: ?N/A ? ?Brant Peets ?09/10/2021, 4:11 PM ?

## 2021-09-10 NOTE — Progress Notes (Signed)
Recreation Therapy Notes ? ?INPATIENT RECREATION TR PLAN ? ?Patient Details ?Name: Carolyn Huynh ?MRN: [id number] ?DOB: June 26, 1955 ?Today's Date: 09/10/2021 ? ?Rec Therapy Plan ?Is patient appropriate for Therapeutic Recreation?: Yes ?Treatment times per week: at least 3 ?Estimated Length of Stay: 5-7 days ?TR Treatment/Interventions: Group participation (Comment) ? ?Discharge Criteria ?Pt will be discharged from therapy if:: Discharged ?Treatment plan/goals/alternatives discussed and agreed upon by:: Patient/family ? ?Discharge Summary ?  ? ? ?Allysson Rinehimer ?09/10/2021, 4:14 PM ?

## 2021-09-10 NOTE — Progress Notes (Signed)
Patient presents alert and oriented to self and place, disoriented to situation. Patient thoughts are delusional and paranoia. Patient denies SI, HI, and denies AVH though observed responding to voices that are not there. Patient denies depression, anxiety or pain. Patient reports having trouble sleeping last might but then says she slept perfectly well. Patient non- compliant with scheduled meds.  Patient ate breakfast and lunch in the day room and spent most of shift in day room. Ongoing Q15 minutes safety check rounds per unit protocol.  ?

## 2021-09-10 NOTE — BHH Group Notes (Signed)
Pt refused group: chair yoga  ?

## 2021-09-10 NOTE — BH IP Treatment Plan (Signed)
Interdisciplinary Treatment and Diagnostic Plan Update ? ?09/10/2021 ?Time of Session: 9:30AM ?Carolyn Huynh ?MRN: [id number] ? ?Principal Diagnosis: Severe manic bipolar 1 disorder with psychotic behavior ([place]) ? ?Secondary Diagnoses: Principal Problem: ?  Severe manic bipolar 1 disorder with psychotic behavior ([place]) ? ? ?Current Medications:  ?Current Facility-Administered Medications  ?Medication Dose Route Frequency Provider Last Rate Last Admin  ? alum & mag hydroxide-simeth (MAALOX/MYLANTA) 200-200-20 MG/5ML suspension 30 mL  30 mL Oral Q4H PRN Parks Ranger, DO      ? LORazepam (ATIVAN) tablet 1 mg  1 mg Oral TID PRN Parks Ranger, DO      ? Or  ? LORazepam (ATIVAN) injection 1 mg  1 mg Intramuscular TID PRN Parks Ranger, DO      ? magnesium hydroxide (MILK OF MAGNESIA) suspension 30 mL  30 mL Oral Daily PRN Parks Ranger, DO      ? OLANZapine zydis (ZYPREXA) disintegrating tablet 10 mg  10 mg Oral QHS Parks Ranger, DO      ? traZODone (DESYREL) tablet 100 mg  100 mg Oral QHS PRN Parks Ranger, DO      ? ?PTA Medications: ?Medications Prior to Admission  ?Medication Sig Dispense Refill Last Dose  ? acetaminophen (TYLENOL) 325 MG tablet Take 2 tablets (650 mg total) by mouth every 6 (six) hours as needed for mild pain (or Fever >/= 101). (Patient not taking: Reported on 09/05/2021)     ? hydrochlorothiazide (MICROZIDE) 12.5 MG capsule Take 1 capsule (12.5 mg total) by mouth daily. (Patient not taking: Reported on 09/05/2021) 30 capsule 3   ? ondansetron (ZOFRAN) 4 MG tablet Take 1 tablet (4 mg total) by mouth every 8 (eight) hours as needed for up to 10 doses for nausea or vomiting. (Patient not taking: Reported on 09/05/2021) 10 tablet 0   ? potassium chloride (K-DUR,KLOR-CON) 10 MEQ tablet Take 1 tablet (10 mEq total) by mouth daily. (Patient not taking: Reported on 09/05/2021) 30 tablet 3   ? ? ?Patient Stressors: Other: Does not know why she is  here.   ? ?Patient Strengths: Supportive family/friends  ? ?Treatment Modalities: Medication Management, Group therapy, Case management,  ?1 to 1 session with clinician, Psychoeducation, Recreational therapy. ? ? ?Physician Treatment Plan for Primary Diagnosis: Severe manic bipolar 1 disorder with psychotic behavior ([place]) ?Long Term Goal(s): Improvement in symptoms so as ready for discharge  ? ?Short Term Goals: Compliance with prescribed medications will improve ? ?Medication Management: Evaluate patient's response, side effects, and tolerance of medication regimen. ? ?Therapeutic Interventions: 1 to 1 sessions, Unit Group sessions and Medication administration. ? ?Evaluation of Outcomes: Not Met ? ?Physician Treatment Plan for Secondary Diagnosis: Principal Problem: ?  Severe manic bipolar 1 disorder with psychotic behavior ([place]) ? ?Long Term Goal(s): Improvement in symptoms so as ready for discharge  ? ?Short Term Goals: Compliance with prescribed medications will improve    ? ?Medication Management: Evaluate patient's response, side effects, and tolerance of medication regimen. ? ?Therapeutic Interventions: 1 to 1 sessions, Unit Group sessions and Medication administration. ? ?Evaluation of Outcomes: Not Met ? ? ?RN Treatment Plan for Primary Diagnosis: Severe manic bipolar 1 disorder with psychotic behavior ([place]) ?Long Term Goal(s): Knowledge of disease and therapeutic regimen to maintain health will improve ? ?Short Term Goals: Ability to remain free from injury will improve, Ability to verbalize frustration and anger appropriately will improve, Ability to demonstrate self-control, Ability to participate in decision making will improve,  Ability to verbalize feelings will improve, Ability to identify and develop effective coping behaviors will improve, and Compliance with prescribed medications will improve ? ?Medication Management: RN will administer medications as ordered by provider, will assess and evaluate  patient's response and provide education to patient for prescribed medication. RN will report any adverse and/or side effects to prescribing provider. ? ?Therapeutic Interventions: 1 on 1 counseling sessions, Psychoeducation, Medication administration, Evaluate responses to treatment, Monitor vital signs and CBGs as ordered, Perform/monitor CIWA, COWS, AIMS and Fall Risk screenings as ordered, Perform wound care treatments as ordered. ? ?Evaluation of Outcomes: Not Met ? ? ?LCSW Treatment Plan for Primary Diagnosis: Severe manic bipolar 1 disorder with psychotic behavior ([place]) ?Long Term Goal(s): Safe transition to appropriate next level of care at discharge, Engage patient in therapeutic group addressing interpersonal concerns. ? ?Short Term Goals: Engage patient in aftercare planning with referrals and resources, Increase social support, Increase ability to appropriately verbalize feelings, Increase emotional regulation, Facilitate acceptance of mental health diagnosis and concerns, Identify triggers associated with mental health/substance abuse issues, and Increase skills for wellness and recovery ? ?Therapeutic Interventions: Assess for all discharge needs, 1 to 1 time with Education officer, museum, Explore available resources and support systems, Assess for adequacy in community support network, Educate family and significant other(s) on suicide prevention, Complete Psychosocial Assessment, Interpersonal group therapy. ? ?Evaluation of Outcomes: Not Met ? ? ?Progress in Treatment: ?Attending groups: No. ?Participating in groups: No. ?Taking medication as prescribed: No. ?Toleration medication: No. ?Family/Significant other contact made: No, will contact:  when given permission ?Patient understands diagnosis: No. ?Discussing patient identified problems/goals with staff: Yes. ?Medical problems stabilized or resolved: Yes. ?Denies suicidal/homicidal ideation: Yes. ?Issues/concerns per patient self-inventory: No. ?Other:  None ? ?New problem(s) identified: No, Describe:  none ? ?New Short Term/Long Term Goal(s): Patient to work towards elimination of symptoms of psychosis, medication management for mood stabilization; development of comprehensive mental wellness plan. ? ? ?Patient Goals:  "wanna go home" ? ? ?Discharge Plan or Barriers: CSW will assist pt with development of appropriate discharge/aftercare plan. ? ?Reason for Continuation of Hospitalization: Delusions  ?Hallucinations ?Medication stabilization ? ?Estimated Length of Stay: TBD ? ? ?Scribe for Treatment Team: ?Andersen Mckiver A [place], [place] ?09/10/2021 ?12:38 PM ?

## 2021-09-11 DIAGNOSIS — F312 Bipolar disorder, current episode manic severe with psychotic features: Secondary | ICD-10-CM | POA: Diagnosis not present

## 2021-09-11 MED ORDER — OLANZAPINE 5 MG PO TABS
10.0000 mg | ORAL_TABLET | Freq: Every day | ORAL | Status: DC
  Administered 2021-09-12 – 2021-09-28 (×16): 10 mg via ORAL
  Filled 2021-09-11 (×17): qty 2

## 2021-09-11 MED ORDER — OLANZAPINE 10 MG IM SOLR
10.0000 mg | Freq: Every day | INTRAMUSCULAR | Status: DC
  Administered 2021-09-11 – 2021-09-16 (×2): 10 mg via INTRAMUSCULAR
  Filled 2021-09-11 (×2): qty 10

## 2021-09-11 NOTE — Progress Notes (Signed)
[facility] MD Progress Note ? ?09/11/2021 10:20 AM ?Carolyn Huynh  ?MRN:  [id number] ?Subjective:  Carolyn Huynh is refusing her medications.  She was brought to the emergency room by her daughter for worsening psychosis and not eating or drinking.  She continues to believe that she is a physician.  She is very delusional.  She says that she does not need the medication and I told her that she can either take the shot or the pill is up to her. ? ?Principal Problem: Severe manic bipolar 1 disorder with psychotic behavior ([place]) ?Diagnosis: Principal Problem: ?  Severe manic bipolar 1 disorder with psychotic behavior ([place]) ? ?Total Time spent with patient: 15 minutes ? ?Past Psychiatric History: Noncompliance ? ?Past Medical History:  ?Past Medical History:  ?Diagnosis Date  ? Asthma   ? COPD (chronic obstructive pulmonary disease) ([place])   ? Dementia ([place])   ? Hypertension   ? Pneumothorax   ?  ?Past Surgical History:  ?Procedure Laterality Date  ? CHEST TUBE INSERTION    ? VIDEO ASSISTED THORACOSCOPY Right 02/01/2018  ? Procedure: VIDEO ASSISTED THORACOSCOPY, BLEB STAPLING;  Surgeon: Ivin Poot, MD;  Location: [facility];  Service: Thoracic;  Laterality: Right;  ? ?Family History: History reviewed. No pertinent family history. ? ?Social History:  ?Social History  ? ?Substance and Sexual Activity  ?Alcohol Use Not Currently  ?   ?Social History  ? ?Substance and Sexual Activity  ?Drug Use Never  ?  ?Social History  ? ?Socioeconomic History  ? Marital status: Significant Other  ?  Spouse name: Not on file  ? Number of children: Not on file  ? Years of education: Not on file  ? Highest education level: Not on file  ?Occupational History  ? Not on file  ?Tobacco Use  ? Smoking status: Never  ? Smokeless tobacco: Never  ?Vaping Use  ? Vaping Use: Never used  ?Substance and Sexual Activity  ? Alcohol use: Not Currently  ? Drug use: Never  ? Sexual activity: Not Currently  ?Other Topics Concern  ? Not on file  ?Social History Narrative   ? Not on file  ? ?Social Determinants of Health  ? ?Financial Resource Strain: Not on file  ?Food Insecurity: Not on file  ?Transportation Needs: Not on file  ?Physical Activity: Not on file  ?Stress: Not on file  ?Social Connections: Not on file  ? ?Additional Social History:  ?  ?  ?  ?  ?  ?  ?  ?  ?  ?  ?  ? ?Sleep: Poor ? ?Appetite:  Poor ? ?Current Medications: ?Current Facility-Administered Medications  ?Medication Dose Route Frequency Provider Last Rate Last Admin  ? alum & mag hydroxide-simeth (MAALOX/MYLANTA) 200-200-20 MG/5ML suspension 30 mL  30 mL Oral Q4H PRN Parks Ranger, DO      ? LORazepam (ATIVAN) tablet 1 mg  1 mg Oral TID PRN Parks Ranger, DO      ? Or  ? LORazepam (ATIVAN) injection 1 mg  1 mg Intramuscular TID PRN Parks Ranger, DO      ? magnesium hydroxide (MILK OF MAGNESIA) suspension 30 mL  30 mL Oral Daily PRN Parks Ranger, DO      ? OLANZapine (ZYPREXA) injection 10 mg  10 mg Intramuscular QHS Parks Ranger, DO      ? Or  ? OLANZapine (ZYPREXA) tablet 10 mg  10 mg Oral QHS Parks Ranger, DO      ?  traZODone (DESYREL) tablet 100 mg  100 mg Oral QHS PRN Parks Ranger, DO      ? ? ?Lab Results: No results found for this or any previous visit (from the past 48 hour(s)). ? ?Blood Alcohol level:  ?Lab Results  ?Component Value Date  ? ETH <10 08/14/2019  ? ? ?Metabolic Disorder Labs: ?No results found for: HGBA1C, MPG ?No results found for: PROLACTIN ?No results found for: CHOL, TRIG, HDL, CHOLHDL, VLDL, LDLCALC ? ?Physical Findings: ?AIMS:  , ,  ,  ,    ?CIWA:    ?COWS:    ? ?Musculoskeletal: ?Strength & Muscle Tone: within normal limits ?Gait & Station: normal ?Patient leans: N/A ? ?Psychiatric Specialty Exam: ? ?Presentation  ?General Appearance: Appropriate for Environment ? ?Eye Contact:Good ? ?Speech:Clear and Coherent; Normal Rate ? ?Speech Volume:Normal ? ?Handedness:Right ? ? ?Mood and Affect   ?Mood:Euthymic ? ?Affect:Congruent ? ? ?Thought Process  ?Thought Processes:Coherent; Disorganized ? ?Descriptions of Associations:Loose ? ?Orientation:Partial (self and place) ? ?Thought Content:Delusions ? ?History of Schizophrenia/Schizoaffective disorder:No ? ?Duration of Psychotic Symptoms:N/A ? ?Hallucinations:No data recorded ?Ideas of Reference:Delusions ? ?Suicidal Thoughts:No data recorded ?Homicidal Thoughts:No data recorded ? ?Sensorium  ?Memory:Immediate Poor; Remote Poor; Recent Poor ? ?Judgment:Impaired ? ?Insight:Poor ? ? ?Executive Functions  ?Concentration:Fair ? ?Attention Span:Fair ? ?Recall:Poor ? ?[place] ? ?Language:Good ? ? ?Psychomotor Activity  ?Psychomotor Activity:No data recorded ? ?Assets  ?Assets:Communication Skills; Desire for Improvement; Housing; Social Support ? ? ?Sleep  ?Sleep:No data recorded ? ? ?Physical Exam: ?Physical Exam ?Vitals and nursing note reviewed.  ?Constitutional:   ?   Appearance: Normal appearance. She is normal weight.  ?Neurological:  ?   General: No focal deficit present.  ?   Mental Status: She is alert and oriented to person, place, and time.  ?Psychiatric:     ?   Attention and Perception: Attention normal. She perceives auditory and visual hallucinations.     ?   Mood and Affect: Mood normal. Affect is angry.     ?   Speech: Speech normal.     ?   Behavior: Behavior is uncooperative and agitated.     ?   Thought Content: Thought content is paranoid and delusional.     ?   Cognition and Memory: Memory normal. Cognition is impaired.     ?   Judgment: Judgment is impulsive and inappropriate.  ? ?Review of Systems  ?Constitutional: Negative.   ?HENT: Negative.    ?Eyes: Negative.   ?Respiratory: Negative.    ?Cardiovascular: Negative.   ?Gastrointestinal: Negative.   ?Genitourinary: Negative.   ?Musculoskeletal: Negative.   ?Skin: Negative.   ?Neurological: Negative.   ?Endo/Heme/Allergies: Negative.   ?Psychiatric/Behavioral:  Positive for  hallucinations.   ?Blood pressure (!) 151/79, pulse 74, temperature 97.7 ?F (36.5 ?C), temperature source Oral, resp. rate 18, height 5\' 2"  (1.575 m), weight 47.2 kg, SpO2 96 %. Body mass index is 19.02 kg/m?. ? ? ?Treatment Plan Summary: ?Daily contact with patient to assess and evaluate symptoms and progress in treatment, Medication management, and Plan Zyprexa 10 mg at bedtime p.o. or IM. ? ?Parks Ranger, DO ?09/11/2021, 10:20 AM ? ?

## 2021-09-11 NOTE — Progress Notes (Signed)
Pt lying in bed with eyes open; calm, cooperative. Pt states that she feels "okay". She currently denies pain, SI/HI/AVH, anxiety and depression. She describes her sleep and appetite as "good" and says "I ate good." She states that her primary doctor gave her medication to her earlier today. No acute distress noted. ?

## 2021-09-11 NOTE — BHH Counselor (Signed)
Adult Comprehensive Assessment ? ?Patient ID: [name], female   DOB: [birth year], 66 y.o.   MRN: [id number] ? ?Information Source: ?Information source: Patient (Partial assessment completed with pt's daughter, Yajahira Tison) ? ?Current Stressors:  ?Patient states their primary concerns and needs for treatment are:: "hospital sent me here" ?Patient states their goals for this hospitilization and ongoing recovery are:: "getting better so I can go home" ?Educational / Learning stressors: Pt denies ?Employment / Job issues: Pt denies ?Family Relationships: Pt denies ?Financial / Lack of resources (include bankruptcy): Pt denies ?Housing / Lack of housing: Pt denies ?Physical health (include injuries & life threatening diseases): COPD ?Social relationships: Pt denies ?Substance abuse: Pt denies ?Bereavement / Loss: Pt denies ? ?Living/Environment/Situation:  ?Living Arrangements: Children ?Living conditions (as described by patient or guardian): Pt states that she lives in [place], sometimes lives alone and sometimes lives with her daughter. Her daughter states that she lives with her permanently ?How long has patient lived in current situation?: Daughter states pt has been living with her in [place] for at least a year ?What is atmosphere in current home: Comfortable ? ?Family History:  ?Marital status: Married ("married to [place]") ?Are you sexually active?:  (unable to assess) ?What is your sexual orientation?: unable to assess ?Has your sexual activity been affected by drugs, alcohol, medication, or emotional stress?: unable to assess ?Does patient have children?: Yes ?How many children?: 6 (4 girls, 2 boys) ?How is patient's relationship with their children?: "close with all of them" ? ?Childhood History:  ?By whom was/is the patient raised?: Both parents ?Additional childhood history information: "you don't wanna know" ?Description of patient's relationship with caregiver when they were a child: "idk, I didn't grow up  with my mom" ?Patient's description of current relationship with people who raised him/her: "they living yeah" ?How were you disciplined when you got in trouble as a child/adolescent?: "don't remember" ?Does patient have siblings?: Yes ?Number of Siblings: 3 (pt states she has one twin sister and 2 brothers) ?Description of patient's current relationship with siblings: "don't get along with my brother" ?Did patient suffer any verbal/emotional/physical/sexual abuse as a child?: No ?Did patient suffer from severe childhood neglect?: No ?Has patient ever been sexually abused/assaulted/raped as an adolescent or adult?: No ?Was the patient ever a victim of a crime or a disaster?: No ?Witnessed domestic violence?: No ?Has patient been affected by domestic violence as an adult?: No ? ?Education:  ?Highest grade of school patient has completed: "just got finished" ?Currently a student?: No ?Learning disability?: No ? 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 ?Financial Resources:   ?Financial resources: No income (Daughter states that pt didn't complete her disability application and her payments stopped) ?Does patient have a representative payee or guardian?: No (Pt's daughter states that she is working to get guardianship over pt in [place]) ? ?Alcohol/Substance Abuse:   ?What has been your use of drugs/alcohol within the last 12 months?: Pt denies ?If attempted suicide, did drugs/alcohol play a role in this?: No ?Alcohol/Substance Abuse Treatment Hx: Denies past history ?Has alcohol/substance abuse ever caused  legal  problems?: No ? ?Social Support System:   ?Forensic psychologist System: Fair ?Describe Community Support System: daughters, "have some friends" ?Type of faith/religion: "just Baptist" ?How does patient's faith help to cope with current illness?: "pray, go to church" ? ?Leisure/Recreation:   ?Do You Have Hobbies?: Yes ?Leisure and Hobbies: "knitting, puzzles" ? ?Strengths/Needs:   ?What is the patient's perception of their strengths?: Pt denies ?Patient states they can use these personal strengths during their treatment to contribute to their recovery: Pt denies ?Patient states these barriers may affect/interfere with their treatment: Pt denies ?Patient states these barriers may affect their return to the community: Pt denies ? ?Discharge Plan:   ?Currently receiving community mental health services: No ?Patient states concerns and preferences for aftercare planning are: Pt states that she is not interested in follow up services or referrals ?Patient states they will know when they are safe and ready for discharge when: "when my primary care doctor discharges me" ?Does patient have access to transportation?: Yes ?Does patient have financial barriers related to discharge medications?: No ?Will patient be returning to same living situation after discharge?: Yes (Pt's daughter states she can return home) ? ?Summary/Recommendations:   ?Summary and Recommendations (to be completed by the evaluator): Patient is a 66 year old female, [place] [facility] [place] [place] Patient reports she is married but daughter, Francille Wittmann, reports that she is single. Patient has no income and daughter reports that she used to received SSDI but did not file the paperwork and no longer receives an income. Patient is unemployed. She presents to the hospital following medical issues and bizarre behavior. Recent stressors include delusions, AVH, not taking medications and worsening dementia symptoms. She has a primary  diagnosis of Severe manic bipolar 1 disorder with psychotic behavior. 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 Patient does not have outpatient follow up and is not interested in referral. Daughter reports that she has a dementia diagnosis and pt gets angry when it is mentioned. Daughter stated that pt can return home but is interested in support for home aid resources.  Recommendations include: crisis stabilization, therapeutic milieu, encourage group attendance and participation, medication management for mood stabilization and development of comprehensive mental wellness plan. ? ?Brysan Mcevoy A [place]. 09/11/2021 ?

## 2021-09-11 NOTE — Progress Notes (Signed)
Recreation Therapy Notes ? ?Date: 09/11/2021 ? ?Time: 1:15pm   ? ?Location: Craft room  ? ?Behavioral response: Appropriate ? ?Intervention Topic:  Goals   ? ?Discussion/Intervention:  ?Group content on today was focused on goals. Patients described what goals are and how they define goals. Individuals expressed how they go about setting goals and reaching them. The group identified how important goals are and if they make short term goals to reach long term goals. Patients described how many goals they work on at a time and what affects them not reaching their goal. Individuals described how much time they put into planning and obtaining their goals. The group participated in the intervention ?My Goal Board? and made personal goal boards to help them achieve their goal. ?Clinical Observations/Feedback: ?Patient came to group late and was focused on what peers and staff had to say about goals. Individual was social with peers and staff while participating in the intervention.    ?[name] LRT/CTRS  ? ? ? ? ? ? ? ?Carolyn Huynh ?09/11/2021 3:10 PM ?

## 2021-09-11 NOTE — BHH Counselor (Signed)
CSW received verbal permission to contact pt's daughter, Janesa Dockery, [phone].  ? ?Carolyn Huynh, MSW, LCSW-A ?3/30/202312:29 PM  ? ?

## 2021-09-11 NOTE — BHH Group Notes (Signed)
B[facility] Group Notes:  (Nursing/MHT/Case Management/Adjunct) ? ?Date:  09/11/2021  ?Time:  10:00 AM ? ?Type of Therapy:  Psychoeducational Skills ? ?Participation Level:  Active ? ?Participation Quality:  Appropriate and Attentive ? ?Affect:  Appropriate ? ?Cognitive:  Appropriate ? ?Insight:  Appropriate and Good ? ?Engagement in Group:  Engaged ? ?Modes of Intervention:  Discussion ? ?Summary of Progress/Problems: ?The pt attended group and was actively engaged. The pt shared during discussions. ?[name] ?09/11/2021, 11:00 AM ?

## 2021-09-11 NOTE — Plan of Care (Signed)
  Problem: Education: Goal: Knowledge of [facility] Education information/materials will improve Outcome: Progressing Goal: Emotional status will improve Outcome: Progressing Goal: Mental status will improve Outcome: Progressing Goal: Verbalization of understanding the information provided will improve Outcome: Progressing   Problem: Activity: Goal: Interest or engagement in activities will improve Outcome: Progressing Goal: Sleeping patterns will improve Outcome: Progressing   Problem: Coping: Goal: Ability to verbalize frustrations and anger appropriately will improve Outcome: Progressing Goal: Ability to demonstrate self-control will improve Outcome: Progressing   Problem: Health Behavior/Discharge Planning: Goal: Identification of resources available to assist in meeting health care needs will improve Outcome: Progressing Goal: Compliance with treatment plan for underlying cause of condition will improve Outcome: Progressing   Problem: Physical Regulation: Goal: Ability to maintain clinical measurements within normal limits will improve Outcome: Progressing   Problem: Safety: Goal: Periods of time without injury will increase Outcome: Progressing   

## 2021-09-11 NOTE — BHH Suicide Risk Assessment (Signed)
BHH INPATIENT:  Family/Significant Other Suicide Prevention Education ? ?Suicide Prevention Education:  ?Education Completed; Emilia Beck,  (name of family member/significant other) has been identified by the patient as the family member/significant other with whom the patient will be residing, and identified as the person(s) who will aid the patient in the event of a mental health crisis (suicidal ideations/suicide attempt).  With written consent from the patient, the family member/significant other has been provided the following suicide prevention education, prior to the and/or following the discharge of the patient. ? ?The suicide prevention education provided includes the following: ?Suicide risk factors ?Suicide prevention and interventions ?National Suicide Hotline telephone number ?[facility] assessment telephone number ?[facility] Emergency Assistance 911 ?[place] and/or Residential Mobile Crisis Unit telephone number ? ?Request made of family/significant other to: ?Remove weapons (e.g., guns, rifles, knives), all items previously/currently identified as safety concern.   ?Remove drugs/medications (over-the-counter, prescriptions, illicit drugs), all items previously/currently identified as a safety concern. ? ?The family member/significant other verbalizes understanding of the suicide prevention education information provided.  The family member/significant other agrees to remove the items of safety concern listed above. ? ?Dominque Levandowski A [place] ?09/11/2021, 12:34 PM ?

## 2021-09-11 NOTE — Plan of Care (Signed)
°  Problem: Group Participation °Goal: STG - Patient will engage in groups without prompting or encouragement from LRT x3 group sessions within 5 recreation therapy group sessions °Description: STG - Patient will engage in groups without prompting or encouragement from LRT x3 group sessions within 5 recreation therapy group sessions °Outcome: Progressing °  °

## 2021-09-11 NOTE — Progress Notes (Signed)
Patient up and ambulating around the unit this morning. She states she slept well last night. Ate her breakfast in the dayroom, noted to be conversing with peers minimally. Patient denies any SI/HI at present. Continues to appear to be talking to someone who isn't in the room at times. Denies any A/v hallucinations though. She is cooperative with assessments, vital signs and the day to day processes on the unit.  ?

## 2021-09-11 NOTE — Progress Notes (Signed)
[facility] Second Physician Opinion Progress Note for Medication Administration to Non-consenting Patients (For Involuntarily Committed Patients) ? ?Patient: Carolyn Huynh ?Date of Birth: [phone] ?MRN: [id number] ? ?Reason for the Medication: The patient, without the benefit of the specific treatment measure, is incapable of participating in any available treatment plan that will give the patient a realistic opportunity of improving the patient's condition. There is, without the benefit of the specific treatment measure, a significant possibility that the patient will harm self or others before improvement of the patient's condition is realized. ? ?Consideration of Side Effects: Consideration of the side effects related to the medication plan has been given. ? ?Rationale for Medication Administration: Patient continues to show evidence of psychotic behavior with dangerous consequences for herself and those around her.  Her refusal of medication is based on psychotic delusional thinking.  Agree that the potential benefit outweighs risk and that it is appropriate to administer forced antipsychotic medication ? ? ? ?Mordecai Rasmussen, MD ?09/11/21  10:17 AM ? ? ?This documentation is good for (7) seven days from the date of the MD signature. New documentation must be completed every seven (7) days with detailed justification in the medical record if the patient requires continued non-emergent administration of psychotropic medications. ?
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
## 2021-09-12 ENCOUNTER — Encounter: Payer: Self-pay | Admitting: Internal Medicine

## 2021-09-12 DIAGNOSIS — F312 Bipolar disorder, current episode manic severe with psychotic features: Secondary | ICD-10-CM | POA: Diagnosis not present

## 2021-09-12 NOTE — Progress Notes (Signed)
Recreation Therapy Notes ? ? Date: 09/12/2021 ? ?Time: 1:15pm  ?  ?Location: Craft room   ?  ?Behavioral response: N/A ?  ?Intervention Topic: Social-Skills  ?  ?Discussion/Intervention: ?Patient refused to attend group. ?  ?Clinical Observations/Feedback: ?Patient refused to attend group. ?  ?Arnette Driggs LRT/CTRS ? ? ? ? ? ? ? ?Sabir Charters ?09/12/2021 3:37 PM ?

## 2021-09-12 NOTE — Progress Notes (Signed)
Patient presents alert and oriented to self only.  States she does not know the year, place or why she is here.  Patient expressed delusions of grandeur stating, "I'm a judge." , disoriented to situation. Patient affect is calm, thoughts of grandeur and behavior withdrawn and isolative.  Denies AVH, SI, HI, depression, anxiety or pain.  VSS.  Patient compliant with all scheduled meds.  Patient spent most of shift isolated to her room.  Ongoing Q15 minute safety check rounds per unit protocol. ? ? 09/12/21 2000  ?Psych Admission Type (Psych Patients Only)  ?Admission Status Involuntary  ?Psychosocial Assessment  ?Patient Complaints None  ?Eye Contact Brief  ?Facial Expression Flat  ?Affect Appropriate to circumstance  ?Speech Unremarkable  ?Interaction Seclusive  ?Motor Activity Slow  ?Appearance/Hygiene Unremarkable;In scrubs  ?Behavior Characteristics Guarded;Calm  ?Mood Euthymic  ?Thought Process  ?Coherency WDL  ?Content Other (Comment) ?(granduer thoughts "I'm a judge")  ?Delusions UTA ?(grandeur "I'm a gudge")  ?Perception WDL  ?Hallucination None reported or observed  ?Judgment Poor  ?Confusion Mild  ?Danger to Self  ?Current suicidal ideation? Denies  ?Danger to Others  ?Danger to Others None reported or observed  ? ? ?
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
## 2021-09-12 NOTE — Progress Notes (Signed)
[facility] MD Progress Note ? ?09/12/2021 11:20 AM ?Carolyn Huynh  ?MRN:  [id number] ?Subjective: Carolyn Huynh is pleasant and cooperative this morning.  She required Zyprexa IM last night because she refuses to take oral medication.  Her insight is very poor.  She remains delusional but pleasant.  She is on forced medications and hopefully after a few injections she will agree to take oral medication. No side effects. ? ?Principal Problem: Severe manic bipolar 1 disorder with psychotic behavior (HCC) ?Diagnosis: Principal Problem: ?  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 ?Total Time spent with patient: 15 minutes ? ?Past Psychiatric History: yes ? ?Past Medical History:  ?Past Medical History:  ?Diagnosis Date  ? Asthma   ? COPD (chronic obstructive pulmonary disease) (HCC)   ? Dementia (HCC)   ? Hypertension   ? Pneumothorax   ?  ?Past Surgical History:  ?Procedure Laterality Date  ? CHEST TUBE INSERTION    ? VIDEO ASSISTED THORACOSCOPY Right 02/01/2018  ? Procedure: VIDEO ASSISTED THORACOSCOPY, BLEB STAPLING;  Surgeon: Kerin Perna, MD;  Location: [facility] OR;  Service: Thoracic;  Laterality: Right;  ? ?Family History: History reviewed. No pertinent family history. ? ?Social History:  ?Social History  ? ?Substance and Sexual Activity  ?Alcohol Use Not Currently  ?   ?Social History  ? ?Substance and Sexual Activity  ?Drug Use Never  ?  ?Social History  ? ?Socioeconomic History  ? Marital status: Significant Other  ?  Spouse name: Not on file  ? Number of children: Not on file  ? Years of education: Not on file  ? Highest education level: Not on file  ?Occupational History  ? Not on file  ?Tobacco Use  ? Smoking status: Never  ? Smokeless tobacco: Never  ?Vaping Use  ? Vaping Use: Never used  ?Substance and Sexual Activity  ? Alcohol use: Not Currently  ? Drug use: Never  ? Sexual activity: Not Currently  ?Other Topics Concern  ? Not on file  ?Social History Narrative  ? Not on file  ? ?Social  Determinants of Health  ? ?Financial Resource Strain: Not on file  ?Food Insecurity: Not on file  ?Transportation Needs: Not on file  ?Physical Activity: Not on file  ?Stress: Not on file  ?Social Connections: Not on file  ? ?Additional Social History:  ?  ?  ?  ?  ?  ?  ?  ?  ?  ?  ?  ? ?Sleep: Good ? ?Appetite:  Fair ? ?Current Medications: ?Current Facility-Administered Medications  ?Medication Dose Route Frequency Provider Last Rate Last Admin  ? alum & mag hydroxide-simeth (MAALOX/MYLANTA) 200-200-20 MG/5ML suspension 30 mL  30 mL Oral Q4H PRN Sarina Ill, DO      ? LORazepam (ATIVAN) tablet 1 mg  1 mg Oral TID PRN Sarina Ill, DO      ? Or  ? LORazepam (ATIVAN) injection 1 mg  1 mg Intramuscular TID PRN Sarina Ill, DO      ? magnesium hydroxide (MILK OF MAGNESIA) suspension 30 mL  30 mL Oral Daily PRN Sarina Ill, DO      ? OLANZapine (ZYPREXA) injection 10 mg  10 mg Intramuscular QHS Sarina Ill, DO   10 mg at 09/11/21 2229  ? Or  ? OLANZapine (ZYPREXA) tablet 10 mg  10 mg Oral QHS Sarina Ill, DO      ? traZODone (DESYREL) tablet 100 mg  100  mg Oral QHS PRN Sarina Ill, DO      ? ? ?Lab Results: No results found for this or any previous visit (from the past 48 hour(s)). ? ?Blood Alcohol level:  ?Lab Results  ?Component Value Date  ? ETH <10 08/14/2019  ? ? ?Metabolic Disorder Labs: ?No results found for: HGBA1C, MPG ?No results found for: PROLACTIN ?No results found for: CHOL, TRIG, HDL, CHOLHDL, VLDL, LDLCALC ? ?Physical Findings: ?AIMS:  , ,  ,  ,    ?CIWA:    ?COWS:    ? ?Musculoskeletal: ?Strength & Muscle Tone: within normal limits ?Gait & Station: normal ?Patient leans: N/A ? ?Psychiatric Specialty Exam: ? ?Presentation  ?General Appearance: Appropriate for Environment ? ?Eye Contact:Good ? ?Speech:Clear and Coherent; Normal Rate ? ?Speech Volume:Normal ? ?Handedness:Right ? ? ?Mood and Affect   ?Mood:Euthymic ? ?Affect:Congruent ? ? ?Thought Process  ?Thought Processes:Coherent; Disorganized ? ?Descriptions of Associations:Loose ? ?Orientation:Partial (self and place) ? ?Thought Content:Delusions ? ?History of Schizophrenia/Schizoaffective disorder:No ? ?Duration of Psychotic Symptoms:N/A ? ?Hallucinations:No data recorded ?Ideas of Reference:Delusions ? ?Suicidal Thoughts:No data recorded ?Homicidal Thoughts:No data recorded ? ?Sensorium  ?Memory:Immediate Poor; Remote Poor; Recent Poor ? ?Judgment:Impaired ? ?Insight:Poor ? ? ?Executive Functions  ?Concentration:Fair ? ?Attention Span:Fair ? ?Recall:Poor ? ?Fund of Knowledge:Fair ? ?Language:Good ? ? ?Psychomotor Activity  ?Psychomotor Activity:No data recorded ? ?Assets  ?Assets:Communication Skills; Desire for Improvement; Housing; Social Support ? ? ?Sleep  ?Sleep:No data recorded ? ? ?Physical Exam: ?Physical Exam ?Vitals and nursing note reviewed.  ?Constitutional:   ?   Appearance: Normal appearance. She is normal weight.  ?Neurological:  ?   General: No focal deficit present.  ?   Mental Status: She is alert and oriented to person, place, and time.  ?Psychiatric:     ?   Attention and Perception: Attention and perception normal.     ?   Mood and Affect: Mood and affect normal.     ?   Speech: Speech normal.     ?   Behavior: Behavior normal. Behavior is cooperative.     ?   Thought Content: Thought content is delusional.     ?   Cognition and Memory: Cognition and memory normal.     ?   Judgment: Judgment is inappropriate.  ? ?Review of Systems  ?Constitutional: Negative.   ?HENT: Negative.    ?Eyes: Negative.   ?Respiratory: Negative.    ?Cardiovascular: Negative.   ?Gastrointestinal: Negative.   ?Genitourinary: Negative.   ?Musculoskeletal: Negative.   ?Skin: Negative.   ?Neurological: Negative.   ?Endo/Heme/Allergies: Negative.   ?Psychiatric/Behavioral: Negative.    ?Blood pressure (!) 144/85, pulse 69, temperature (!) 97.3 ?F (36.3 ?C),  temperature source Oral, resp. rate 18, height 5\' 2"  (1.575 m), weight 47.2 kg, SpO2 100 %. Body mass index is 19.02 kg/m?. ? ? ?Treatment Plan Summary: ?Daily contact with patient to assess and evaluate symptoms and progress in treatment, Medication management, and Plan continue current medications. ? ? , DO ?09/12/2021, 11:20 AM ? ?

## 2021-09-12 NOTE — Progress Notes (Deleted)
Patient is alert and oriented to self and place, disoriented to situation. Patient reports sleeping well last night. Patient denies SI, HI, AVH, depression, anxiety and pain. Continues to respond to internal stimuli, noted multiple times talking to someone who is not in the room. Patient cooperative with care, ate breakfast and lunch in day room with minimal interaction with peers and staff. Ongoing Q15 minute safety check rounds per unit protocol. ?

## 2021-09-12 NOTE — Progress Notes (Signed)
Patient is alert and oriented to self and place, disoriented to situation. Patient reports sleeping well last night. Patient denies SI, HI, AVH, depression, anxiety and pain. Continues to respond to internal stimuli, noted multiple times talking to someone who is not in the room. Patient cooperative with care, ate breakfast and lunch in day room with minimal interaction with peers and staff. Ongoing Q15 minute safety check rounds per unit protocol. ?

## 2021-09-13 NOTE — Group Note (Signed)
LCSW Group Therapy Note ? ?Group Date: 09/13/2021 ?Start Time: 1300 ?End Time: 1400 ? ? ?Type of Therapy and Topic:  Group Therapy - Healthy vs Unhealthy Coping Skills ? ?Participation Level:  Did Not Attend  ? ?Description of Group ?The focus of this group was to determine what unhealthy coping techniques typically are used by group members and what healthy coping techniques would be helpful in coping with various problems. Patients were guided in becoming aware of the differences between healthy and unhealthy coping techniques. Patients were asked to identify 2-3 healthy coping skills they would like to learn to use more effectively. ? ?Therapeutic Goals ?Patients learned that coping is what human beings do all day long to deal with various situations in their lives ?Patients defined and discussed healthy vs unhealthy coping techniques ?Patients identified their preferred coping techniques and identified whether these were healthy or unhealthy ?Patients determined 2-3 healthy coping skills they would like to become more familiar with and use more often. ?Patients provided support and ideas to each other ? ? ?Summary of Patient Progress: Due to limited staffing, group was not held on the unit.  ? ?Therapeutic Modalities ?Cognitive Behavioral Therapy ?Motivational Interviewing ? ?Aarian Cleaver K Alliyah Roesler, LCSWA ?09/13/2021  4:30 PM   ?

## 2021-09-13 NOTE — Progress Notes (Signed)
Patient denies SI, HI & AVH, denies depression & denies anxiety. Patient has been cooperative with treatment with no new behavioral issues to report on shift at this time.  ?

## 2021-09-13 NOTE — Progress Notes (Signed)
[facility] MD Progress Note ? ?09/13/2021 2:08 PM ?Carolyn Huynh  ?MRN:  [id number] ?Subjective:  Patient is alert and oriented to self and place, disoriented to situation. Patient reports sleeping well last night. Patient denies SI, HI, AVH, depression, anxiety and pain. Continues to respond to internal stimuli, noted multiple times talking to someone who is not in the room. Patient cooperative with care, ate breakfast and lunch in day room with minimal interaction with peers and staff.  ? Patient reports feeling well today, still claims to be Dr Earlie Server and is denying current SI/HI or any AVH. She claims to be compliant with her medicines and is generally appropriate on the floor.  ?Principal Problem: Severe manic bipolar 1 disorder with psychotic behavior ([place]) ?Diagnosis: Principal Problem: ?  Severe manic bipolar 1 disorder with psychotic behavior ([place]) ? ?Total Time spent with patient: 30 minutes ? ?Past Psychiatric History: Schizophrenia ? ?Past Medical History:  ?Past Medical History:  ?Diagnosis Date  ? Asthma   ? COPD (chronic obstructive pulmonary disease) ([place])   ? Dementia ([place])   ? Hypertension   ? Pneumothorax   ?  ?Past Surgical History:  ?Procedure Laterality Date  ? CHEST TUBE INSERTION    ? VIDEO ASSISTED THORACOSCOPY Right 02/01/2018  ? Procedure: VIDEO ASSISTED THORACOSCOPY, BLEB STAPLING;  Surgeon: Ivin Poot, MD;  Location: [place];  Service: Thoracic;  Laterality: Right;  ? ?Family History: History reviewed. No pertinent family history. ?Family Psychiatric  History:  ?Social History:  ?Social History  ? ?Substance and Sexual Activity  ?Alcohol Use Not Currently  ?   ?Social History  ? ?Substance and Sexual Activity  ?Drug Use Never  ?  ?Social History  ? ?Socioeconomic History  ? Marital status: Significant Other  ?  Spouse name: Not on file  ? Number of children: Not on file  ? Years of education: Not on file  ? Highest education level: Not on file  ?Occupational History  ? Not on file  ?Tobacco  Use  ? Smoking status: Never  ? Smokeless tobacco: Never  ?Vaping Use  ? Vaping Use: Never used  ?Substance and Sexual Activity  ? Alcohol use: Not Currently  ? Drug use: Never  ? Sexual activity: Not Currently  ?Other Topics Concern  ? Not on file  ?Social History Narrative  ? Not on file  ? ?Social Determinants of Health  ? ?Financial Resource Strain: Not on file  ?Food Insecurity: Not on file  ?Transportation Needs: Not on file  ?Physical Activity: Not on file  ?Stress: Not on file  ?Social Connections: Not on file  ? ?Additional Social History:  ?  ?  ?  ?  ?  ?  ?  ?  ?  ?  ?  ? ?Sleep: Fair ? ?Appetite:  Poor ? ?Current Medications: ?Current Facility-Administered Medications  ?Medication Dose Route Frequency Provider Last Rate Last Admin  ? alum & mag hydroxide-simeth (MAALOX/MYLANTA) 200-200-20 MG/5ML suspension 30 mL  30 mL Oral Q4H PRN Parks Ranger, DO      ? LORazepam (ATIVAN) tablet 1 mg  1 mg Oral TID PRN Parks Ranger, DO      ? Or  ? LORazepam (ATIVAN) injection 1 mg  1 mg Intramuscular TID PRN Parks Ranger, DO      ? magnesium hydroxide (MILK OF MAGNESIA) suspension 30 mL  30 mL Oral Daily PRN Parks Ranger, DO      ? OLANZapine (ZYPREXA) injection 10  mg  10 mg Intramuscular QHS Parks Ranger, DO   10 mg at 09/11/21 2229  ? Or  ? OLANZapine (ZYPREXA) tablet 10 mg  10 mg Oral QHS Parks Ranger, DO   10 mg at 09/12/21 2106  ? traZODone (DESYREL) tablet 100 mg  100 mg Oral QHS PRN Parks Ranger, DO      ? ? ?Lab Results: No results found for this or any previous visit (from the past 48 hour(s)). ? ?Blood Alcohol level:  ?Lab Results  ?Component Value Date  ? ETH <10 08/14/2019  ? ? ?Metabolic Disorder Labs: ?No results found for: HGBA1C, MPG ?No results found for: PROLACTIN ?No results found for: CHOL, TRIG, HDL, CHOLHDL, VLDL, LDLCALC ? ?Physical Findings: ?AIMS:  , ,  ,  ,    ?CIWA:    ?COWS:    ? ?Musculoskeletal: ?Strength &  Muscle Tone: within normal limits ?Gait & Station: normal ?Patient leans: N/A ? ?Psychiatric Specialty Exam: ? ?Presentation  ?General Appearance: Appropriate for Environment ? ?Eye Contact:Good ? ?Speech:Clear and Coherent; Normal Rate ? ?Speech Volume:Normal ? ?Handedness:Right ? ? ?Mood and Affect  ?Mood:Euthymic ? ?Affect:Congruent ? ? ?Thought Process  ?Thought Processes:Coherent; Disorganized ? ?Descriptions of Associations:Loose ? ?Orientation:Partial (self and place) ? ?Thought Content:Delusions ? ?History of Schizophrenia/Schizoaffective disorder:No ? ?Duration of Psychotic Symptoms:N/A ? ?Hallucinations:No data recorded ?Ideas of Reference:Delusions ? ?Suicidal Thoughts:No data recorded ?Homicidal Thoughts:No data recorded ? ?Sensorium  ?Memory:Immediate Poor; Remote Poor; Recent Poor ? ?Judgment:Impaired ? ?Insight:Poor ? ? ?Executive Functions  ?Concentration:Fair ? ?Attention Span:Fair ? ?Recall:Poor ? ?[place] ? ?Language:Good ? ? ?Psychomotor Activity  ?Psychomotor Activity:No data recorded ? ?Assets  ?Assets:Communication Skills; Desire for Improvement; Housing; Social Support ? ? ?Sleep  ?Sleep:No data recorded ? ? ?Physical Exam: ?Physical Exam ?Constitutional:   ?   Appearance: Normal appearance. She is normal weight.  ?HENT:  ?   Head: Normocephalic and atraumatic.  ?   Right Ear: Tympanic membrane normal.  ?   Left Ear: Tympanic membrane normal.  ?   Nose: Nose normal.  ?   Mouth/Throat:  ?   Mouth: Mucous membranes are moist.  ?   Pharynx: Oropharynx is clear.  ?Eyes:  ?   Extraocular Movements: Extraocular movements intact.  ?   Conjunctiva/sclera: Conjunctivae normal.  ?   Pupils: Pupils are equal, round, and reactive to light.  ?Cardiovascular:  ?   Rate and Rhythm: Normal rate and regular rhythm.  ?   Pulses: Normal pulses.  ?   Heart sounds: Normal heart sounds.  ?Pulmonary:  ?   Effort: Pulmonary effort is normal.  ?   Breath sounds: Normal breath sounds.  ?Abdominal:  ?    General: Abdomen is flat. Bowel sounds are normal.  ?   Palpations: Abdomen is soft.  ?Musculoskeletal:     ?   General: Normal range of motion.  ?   Cervical back: Normal range of motion and neck supple.  ?Skin: ?   General: Skin is warm.  ?Neurological:  ?   General: No focal deficit present.  ?   Mental Status: She is alert.  ? ?Review of Systems  ?Constitutional: Negative.   ?HENT: Negative.    ?Eyes: Negative.   ?Respiratory: Negative.    ?Cardiovascular: Negative.   ?Gastrointestinal: Negative.   ?Genitourinary: Negative.   ?Musculoskeletal: Negative.   ?Skin: Negative.   ?Neurological: Negative.   ?Endo/Heme/Allergies: Negative.   ?Psychiatric/Behavioral:  The patient is nervous/anxious.   ?Blood  pressure (!) 151/71, pulse 71, temperature 97.9 ?F (36.6 ?C), resp. rate 18, height 5\' 2"  (1.575 m), weight 47.2 kg, SpO2 100 %. Body mass index is 19.02 kg/m?. ? ? ?Treatment Plan Summary: ?Daily contact with patient to assess and evaluate symptoms and progress in treatment, Medication management, and Plan : continue current medications and treatment.  ? ?Carolyn Huynh Wynell Balloon ?09/13/2021, 2:08 PM ? ?

## 2021-09-14 NOTE — Plan of Care (Signed)
Patient did present Flat during shift.  Pt denies SI/HI/Avh and contracts for safety.  Pt did appear to respond to internal stimuli. Pt denies Depression and Anxiety.  Pt did have minimal participation on unit during shift.  Pt did comply with scheduled medications but did question medications.  Patient did have hs snack. Encouraged fluid intake.  Q 15 minute safety checks in place.  Informed patient to notify nursing staff if needs arise.  Q 15 minute safety checks in place.  Plan of care will continue.   ? ?Problem: Education: ?Goal: Knowledge of [facility] Education information/materials will improve ?Outcome: Progressing ?Goal: Emotional status will improve ?Outcome: Progressing ?Goal: Verbalization of understanding the information provided will improve ?Outcome: Progressing ?  ?

## 2021-09-14 NOTE — Progress Notes (Signed)
?   09/14/21 1500  ?Psych Admission Type (Psych Patients Only)  ?Admission Status Involuntary  ?Psychosocial Assessment  ?Patient Complaints None  ?Eye Contact Brief  ?Facial Expression Flat  ?Affect Appropriate to circumstance  ?Speech Unremarkable  ?Interaction Minimal  ?Motor Activity Slow  ?Appearance/Hygiene In hospital gown  ?Behavior Characteristics Appropriate to situation  ?Mood Euthymic;Preoccupied  ?Thought Process  ?Coherency WDL  ?Content WDL  ?Delusions None reported or observed  ?Perception WDL  ?Hallucination None reported or observed  ?Judgment Poor  ?Confusion Mild  ?Danger to Self  ?Current suicidal ideation? Denies  ?Danger to Others  ?Danger to Others None reported or observed  ? ?Patient visible in Milieu compliant with meals and medications this shift. Denies SI/HI/A/VH but appears to be responding to internal stimuli. No adverse drug noted. ? ?Support and encouragement provided. ?

## 2021-09-14 NOTE — Group Note (Signed)
LCSW Group Therapy Note ? ?Group Date: 09/14/2021 ?Start Time: 1300 ?End Time: 1350 ? ? ?Type of Therapy and Topic:  Group Therapy - Healthy vs Unhealthy Coping Skills ? ?Participation Level:  Active  ? ?Description of Group ?The focus of this group was to determine what unhealthy coping techniques typically are used by group members and what healthy coping techniques would be helpful in coping with various problems. Patients were guided in becoming aware of the differences between healthy and unhealthy coping techniques. Patients were asked to identify 2-3 healthy coping skills they would like to learn to use more effectively. ? ?Therapeutic Goals ?Patients learned that coping is what human beings do all day long to deal with various situations in their lives ?Patients defined and discussed healthy vs unhealthy coping techniques ?Patients identified their preferred coping techniques and identified whether these were healthy or unhealthy ?Patients determined 2-3 healthy coping skills they would like to become more familiar with and use more often. ?Patients provided support and ideas to each other ? ? ?Summary of Patient Progress: Patient was present for the entirety of the group session. Patient was an active listener and participated in the topic of discussion. Patient presented with a flat affect. Patient identified going for a walk and eating healthy as coping skills. Patient did not appear to respond to internal stimuli during group; however, patient verbalized what is likely delusional thought content, stating that she flew herself to [place] and has paintings that she painted hanging in [place].  ? ? ?Therapeutic Modalities ?Cognitive Behavioral Therapy ?Motivational Interviewing ? ?Ileana Ladd [place], LCSWA ?09/14/2021  3:32 PM   ?

## 2021-09-14 NOTE — Progress Notes (Signed)
[facility] MD Progress Note ? ?09/14/2021 12:31 PM ?Carolyn Huynh  ?MRN:  [id number] ?Subjective:   ?Patient did present [place] during shift.  Pt denies SI/HI/Avh and contracts for safety.  Pt did appear to respond to internal stimuli. Pt denies Depression and Anxiety.  Pt did have minimal participation on unit during shift.  Pt did comply with scheduled medications but did question medications.  Patient did have hs snack. Encouraged fluid intake.   ? Patient has been calm and co-operative on the floor and took her pills last night. She reports feeling well today, is denying current SI/HI or any AVH.  ? ?Note Details ? ?Author Isabella Bowens, RN File Time 09/13/2021  5:42 PM  ?Author Type Registered Nurse Status Signed  ?Last Editor Wiley, Apolonio Schneiders, RN Service (none)  ?Hospital Acct # [id number] Admit Date 09/09/2021  ? ?Principal Problem: Severe manic bipolar 1 disorder with psychotic behavior ([place]) ?Diagnosis: Principal Problem: ?  Severe manic bipolar 1 disorder with psychotic behavior ([place]) ? ?Total Time spent with patient: 20 minutes ? ?Past Psychiatric History:  ? ?Past Medical History:  ?Past Medical History:  ?Diagnosis Date  ? Asthma   ? COPD (chronic obstructive pulmonary disease) ([place])   ? Dementia ([place])   ? Hypertension   ? Pneumothorax   ?  ?Past Surgical History:  ?Procedure Laterality Date  ? CHEST TUBE INSERTION    ? VIDEO ASSISTED THORACOSCOPY Right 02/01/2018  ? Procedure: VIDEO ASSISTED THORACOSCOPY, BLEB STAPLING;  Surgeon: Ivin Poot, MD;  Location: [place];  Service: Thoracic;  Laterality: Right;  ? ?Family History: History reviewed. No pertinent family history. ?Family Psychiatric  History:  ?Social History:  ?Social History  ? ?Substance and Sexual Activity  ?Alcohol Use Not Currently  ?   ?Social History  ? ?Substance and Sexual Activity  ?Drug Use Never  ?  ?Social History  ? ?Socioeconomic History  ? Marital status: Significant Other  ?  Spouse name: Not on file  ? Number of children: Not on file  ?  Years of education: Not on file  ? Highest education level: Not on file  ?Occupational History  ? Not on file  ?Tobacco Use  ? Smoking status: Never  ? Smokeless tobacco: Never  ?Vaping Use  ? Vaping Use: Never used  ?Substance and Sexual Activity  ? Alcohol use: Not Currently  ? Drug use: Never  ? Sexual activity: Not Currently  ?Other Topics Concern  ? Not on file  ?Social History Narrative  ? Not on file  ? ?Social Determinants of Health  ? ?Financial Resource Strain: Not on file  ?Food Insecurity: Not on file  ?Transportation Needs: Not on file  ?Physical Activity: Not on file  ?Stress: Not on file  ?Social Connections: Not on file  ? ?Additional Social History:  ?  ?  ?  ?  ?  ?  ?  ?  ?  ?  ?  ? ?Sleep: Fair ? ?Appetite:  Fair ? ?Current Medications: ?Current Facility-Administered Medications  ?Medication Dose Route Frequency Provider Last Rate Last Admin  ? alum & mag hydroxide-simeth (MAALOX/MYLANTA) 200-200-20 MG/5ML suspension 30 mL  30 mL Oral Q4H PRN Parks Ranger, DO      ? LORazepam (ATIVAN) tablet 1 mg  1 mg Oral TID PRN Parks Ranger, DO      ? Or  ? LORazepam (ATIVAN) injection 1 mg  1 mg Intramuscular TID PRN Parks Ranger, DO      ?  magnesium hydroxide (MILK OF MAGNESIA) suspension 30 mL  30 mL Oral Daily PRN Parks Ranger, DO      ? OLANZapine [facility]) injection 10 mg  10 mg Intramuscular QHS Parks Ranger, DO   10 mg at 09/11/21 2229  ? Or  ? OLANZapine (ZYPREXA) tablet 10 mg  10 mg Oral QHS Parks Ranger, DO   10 mg at 09/13/21 2233  ? traZODone (DESYREL) tablet 100 mg  100 mg Oral QHS PRN Parks Ranger, DO      ? ? ?Lab Results: No results found for this or any previous visit (from the past 48 hour(s)). ? ?Blood Alcohol level:  ?Lab Results  ?Component Value Date  ? ETH <10 08/14/2019  ? ? ?Metabolic Disorder Labs: ?No results found for: HGBA1C, MPG ?No results found for: PROLACTIN ?No results found for: CHOL, TRIG, HDL,  CHOLHDL, VLDL, LDLCALC ? ?Physical Findings: ?AIMS:  , ,  ,  ,    ?CIWA:    ?COWS:    ? ?Musculoskeletal: ?Strength & Muscle Tone: within normal limits ?Gait & Station: normal ?Patient leans: Front ? ?Psychiatric Specialty Exam: ? ?Presentation  ?General Appearance: Appropriate for Environment ? ?Eye Contact:Good ? ?Speech:Clear and Coherent; Normal Rate ? ?Speech Volume:Normal ? ?Handedness:Right ? ? ?Mood and Affect  ?Mood:Euthymic ? ?Affect:Congruent ? ? ?Thought Process  ?Thought Processes:Coherent; Disorganized ? ?Descriptions of Associations:Loose ? ?Orientation:Partial (self and place) ? ?Thought Content:Delusions ? ?History of Schizophrenia/Schizoaffective disorder:No ? ?Duration of Psychotic Symptoms:N/A ? ?Hallucinations:No data recorded ?Ideas of Reference:Delusions ? ?Suicidal Thoughts:No data recorded ?Homicidal Thoughts:No data recorded ? ?Sensorium  ?Memory:Immediate Poor; Remote Poor; Recent Poor ? ?Judgment:Impaired ? ?Insight:Poor ? ? ?Executive Functions  ?Concentration:Fair ? ?Attention Span:Fair ? ?Recall:Poor ? ?[place] ? ?Language:Good ? ? ?Psychomotor Activity  ?Psychomotor Activity:No data recorded ? ?Assets  ?Assets:Communication Skills; Desire for Improvement; Housing; Social Support ? ? ?Sleep  ?Sleep:No data recorded ? ? ?Physical Exam: ?Physical Exam ?Constitutional:   ?   Appearance: Normal appearance. She is normal weight.  ?HENT:  ?   Head: Normocephalic and atraumatic.  ?   Right Ear: External ear normal.  ?   Left Ear: External ear normal.  ?   Nose: Nose normal.  ?   Mouth/Throat:  ?   Mouth: Mucous membranes are moist.  ?   Pharynx: Oropharynx is clear.  ?Eyes:  ?   Conjunctiva/sclera: Conjunctivae normal.  ?   Pupils: Pupils are equal, round, and reactive to light.  ?Cardiovascular:  ?   Rate and Rhythm: Normal rate and regular rhythm.  ?   Pulses: Normal pulses.  ?   Heart sounds: Normal heart sounds.  ?Pulmonary:  ?   Effort: Pulmonary effort is normal.  ?    Breath sounds: Normal breath sounds.  ?Abdominal:  ?   General: Abdomen is flat. Bowel sounds are normal.  ?   Palpations: Abdomen is soft.  ?Musculoskeletal:     ?   General: Normal range of motion.  ?   Cervical back: Normal range of motion.  ?Skin: ?   General: Skin is warm and dry.  ?Neurological:  ?   General: No focal deficit present.  ?   Mental Status: She is alert.  ? ?Review of Systems  ?Constitutional: Negative.   ?HENT: Negative.    ?Eyes: Negative.   ?Respiratory: Negative.    ?Cardiovascular: Negative.   ?Gastrointestinal: Negative.   ?Genitourinary: Negative.   ?Musculoskeletal: Negative.   ?Skin: Negative.   ?  Neurological: Negative.   ?Endo/Heme/Allergies: Negative.   ?Psychiatric/Behavioral:  The patient is nervous/anxious.   ?Blood pressure (!) 147/79, pulse 81, temperature 98.1 ?F (36.7 ?C), temperature source Oral, resp. rate 18, height 5\' 2"  (1.575 m), weight 47.2 kg, SpO2 100 %. Body mass index is 19.02 kg/m?. ? ? ?Treatment Plan Summary: ?Daily contact with patient to assess and evaluate symptoms and progress in treatment, Medication management, and Plan : Continue current meds.  ? ?Jaber Dunlow Wynell Balloon ?09/14/2021, 12:31 PM ? ?

## 2021-09-15 DIAGNOSIS — F312 Bipolar disorder, current episode manic severe with psychotic features: Secondary | ICD-10-CM | POA: Diagnosis not present

## 2021-09-15 LAB — LIPID PANEL
Cholesterol: 137 mg/dL (ref 0–200)
HDL: 56 mg/dL (ref >40)
LDL Cholesterol: 71 mg/dL (ref 0–99)
Total CHOL/HDL Ratio: 2.4 RATIO
Triglycerides: 49 mg/dL (ref <150)
VLDL: 10 mg/dL (ref 0–40)

## 2021-09-15 MED ORDER — DONEPEZIL HCL 5 MG PO TABS
5.0000 mg | ORAL_TABLET | Freq: Every day | ORAL | Status: DC
  Filled 2021-09-15 (×2): qty 1

## 2021-09-15 NOTE — Progress Notes (Signed)
?   09/15/21 0146  ?What Happened  ?Was fall witnessed? No  ?Was patient injured? No  ?Patient found on floor;in bathroom  ?Found by Staff-comment  ?Stated prior activity ambulating-unassisted  ?Follow Up  ?MD notified Lerry Liner  ?Time MD notified 0203  ?Family notified Yes - comment ?(will notify in am)  ?Time family notified 15  ?Additional tests No  ?Simple treatment Other (comment) ?(none)  ?Progress note created (see row info) Yes  ?Adult Fall Risk Assessment  ?Risk Factor Category (scoring not indicated) High fall risk per protocol (document High fall risk)  ?Age 66  ?Fall History: Fall within 6 months prior to admission 0  ?Elimination; Bowel and/or Urine Incontinence 0  ?Elimination; Bowel and/or Urine Urgency/Frequency 0  ?Medications: includes PCA/Opiates, Anti-convulsants, Anti-hypertensives, Diuretics, Hypnotics, Laxatives, Sedatives, and Psychotropics 0  ?Patient Care Equipment 0  ?Mobility-Assistance 0  ?Mobility-Gait 0  ?Mobility-Sensory Deficit 0  ?Altered awareness of immediate physical environment 0  ?Impulsiveness 0  ?Lack of understanding of one's physical/cognitive limitations 0  ?Total Score 1  ?Patient Fall Risk Level High fall risk  ?Adult Fall Risk Interventions  ?Required Bundle Interventions *See Row Information* High fall risk - low, moderate, and high requirements implemented  ?Additional Interventions Use of appropriate toileting equipment (bedpan, BSC, etc.)  ?Screening for Fall Injury Risk (To be completed on HIGH fall risk patients) - Assessing Need for Floor Mats  ?Risk For Fall Injury- Criteria for Floor Mats None identified - No additional interventions needed  ?Vitals  ?Temp 98 ?F (36.7 ?C)  ?Temp Source Oral  ?BP (!) 106/52  ?MAP (mmHg) 69  ?BP Location Left Arm  ?BP Method Automatic  ?Patient Position (if appropriate) Sitting  ?Pulse Rate 61  ?Pulse Rate Source Monitor  ?Resp 20  ?Oxygen Therapy  ?SpO2 99 %  ?O2 Device Room Air  ?Pain Assessment  ?Pain Scale 0-10  ?Pain  Score 0  ?Neurological  ?Neuro (WDL) X  ?Level of Consciousness Responds to Voice  ?Orientation Level  ?(UTA - pt lethargic)  ?Glasgow Coma Scale  ?Eye Opening 3  ?Best Verbal Response (NON-intubated) 1  ?Modified Verbal Response (INTUBATED) 3  ?Best Motor Response 5  ?Glasgow Coma Scale Score 12  ?Musculoskeletal  ?Musculoskeletal (WDL) X  ?Generalized Weakness Yes  ? ? ?

## 2021-09-15 NOTE — Group Note (Signed)
[facility] LCSW Group Therapy Note ? ? ? ?Group Date: 09/15/2021 ?Start Time: 1430 ?End Time: 1530 ? ?Type of Therapy and Topic:  Group Therapy:  Overcoming Obstacles ? ?Participation Level:  [facility] PARTICIPATION LEVEL: Did Not Attend ? ?Mood: ? ?Description of Group:   ?In this group patients will be encouraged to explore what they see as obstacles to their own wellness and recovery. They will be guided to discuss their thoughts, feelings, and behaviors related to these obstacles. The group will process together ways to cope with barriers, with attention given to specific choices patients can make. Each patient will be challenged to identify changes they are motivated to make in order to overcome their obstacles. This group will be process-oriented, with patients participating in exploration of their own experiences as well as giving and receiving support and challenge from other group members. ? ?Therapeutic Goals: ?1. Patient will identify personal and current obstacles as they relate to admission. ?2. Patient will identify barriers that currently interfere with their wellness or overcoming obstacles.  ?3. Patient will identify feelings, thought process and behaviors related to these barriers. ?4. Patient will identify two changes they are willing to make to overcome these obstacles:  ? ? ?Summary of Patient Progress ? ? ?Patient did not attend group despite encouraged participation.  ? ? ?Therapeutic Modalities:   ?Cognitive Behavioral Therapy ?Solution Focused Therapy ?Motivational Interviewing ?Relapse Prevention Therapy ? ? ?Ciro Tashiro W Kambrea Carrasco, LCSWA ?

## 2021-09-15 NOTE — Progress Notes (Addendum)
Staff heard patient call for help from her room.  Staff found patient lying on left side on bathroom floor. Alert and oriented at baseline. Patient stated, "I got dizzy and braced my fall so I wouldn't break anything." Patient denied hitting the floor.  Denies pain or discomfort. Staff assisted patient to toilet. Patient stated she had to have a bowel movement and began to strain on toilet.  Pt became less responsive until she passed a mod size stool then became more alert. Episode lasted approx 1 minute.  VSS.  Patient required min assist to stand then stood at sink independently to wash her hands and ambulate with steady gait back to bed. Denies any further dizziness. Will continue with Q 15 min safety checks.  MD notified of event.  No new orders received.  Will notify family later this am.  Patient placed on high fall protocol per hospital policy. ?

## 2021-09-15 NOTE — Progress Notes (Signed)
Pt lying in bed with eyes open; calm, cooperative. Pt states "I'm feeling fine." She currently denies pain, SI/HI/AVH, anxiety and depression. She describes her sleep as "okay". She says that her appetite "wasn't so good" today because her meat was too thick and she was not allowed to cut it with a plastic knife. Pt instructed to ask staff to cut her food if it is too big or thick; she verbalized understanding. No acute distress noted. ?

## 2021-09-15 NOTE — Progress Notes (Signed)
Recreation Therapy Notes ? ?Date: 09/15/2021 ? ?Time: 1:35pm    ? ?Location: Courtyard   ? ?Behavioral response: N/A ?  ?Intervention Topic: Emotions   ? ?Discussion/Intervention: ?Patient refused to attend group.  ? ?Clinical Observations/Feedback:  ?Patient refused to attend group.  ?  ?Chistine Dematteo LRT/CTRS ? ? ? ? ? ? ? ? ?Ozzie Knobel ?09/15/2021 3:20 PM ?

## 2021-09-15 NOTE — Progress Notes (Addendum)
[facility] MD Progress Note ? ?09/15/2021 11:29 AM ?Carolyn Huynh  ?MRN:  [id number] ?Subjective: Carolyn Huynh is very pleasant and cooperative today.  She states that she feels much better.  Her affect is much improved.  She is taking her medications as prescribed and denies any side effects.  She did have a vasovagal episode yesterday while on the toilet but other than that she is doing better.  She continues to have fixed delusions.  She denies any suicidal ideation. ? ?Principal Problem: Severe manic bipolar 1 disorder with psychotic behavior ([facility]) ?Diagnosis: Principal Problem: ?  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 ?Total Time spent with patient: 15 minutes ? ?Past Psychiatric History: History of schizophrenia and dementia and been seen at [facility] in [place]. ? ?Past Medical History:  ?Past Medical History:  ?Diagnosis Date  ? Asthma   ? COPD (chronic obstructive pulmonary disease) ([facility])   ? Dementia ([facility])   ? Hypertension   ? Pneumothorax   ?  ?Past Surgical History:  ?Procedure Laterality Date  ? CHEST TUBE INSERTION    ? VIDEO ASSISTED THORACOSCOPY Right 02/01/2018  ? Procedure: VIDEO ASSISTED THORACOSCOPY, BLEB STAPLING;  Surgeon: Kerin Perna, MD;  Location: [facility] OR;  Service: Thoracic;  Laterality: Right;  ? ?Family History: History reviewed. No pertinent family history. ? ?Social History:  ?Social History  ? ?Substance and Sexual Activity  ?Alcohol Use Not Currently  ?   ?Social History  ? ?Substance and Sexual Activity  ?Drug Use Never  ?  ?Social History  ? ?Socioeconomic History  ? Marital status: Significant Other  ?  Spouse name: Not on file  ? Number of children: Not on file  ? Years of education: Not on file  ? Highest education level: Not on file  ?Occupational History  ? Not on file  ?Tobacco Use  ? Smoking status: Never  ? Smokeless tobacco: Never  ?Vaping Use  ? Vaping Use: Never used  ?Substance and Sexual Activity  ? Alcohol use: Not  Currently  ? Drug use: Never  ? Sexual activity: Not Currently  ?Other Topics Concern  ? Not on file  ?Social History Narrative  ? Not on file  ? ?Social Determinants of Health  ? ?Financial Resource Strain: Not on file  ?Food Insecurity: Not on file  ?Transportation Needs: Not on file  ?Physical Activity: Not on file  ?Stress: Not on file  ?Social Connections: Not on file  ? ?Additional Social History:  ?  ?  ?  ?  ?  ?  ?  ?  ?  ?  ?  ? ?Sleep: Good ? ?Appetite:  Good ? ?Current Medications: ?Current Facility-Administered Medications  ?Medication Dose Route Frequency Provider Last Rate Last Admin  ? alum & mag hydroxide-simeth (MAALOX/MYLANTA) 200-200-20 MG/5ML suspension 30 mL  30 mL Oral Q4H PRN Sarina Ill, DO      ? LORazepam (ATIVAN) tablet 1 mg  1 mg Oral TID PRN Sarina Ill, DO      ? Or  ? LORazepam (ATIVAN) injection 1 mg  1 mg Intramuscular TID PRN Sarina Ill, DO      ? magnesium hydroxide (MILK OF MAGNESIA) suspension 30 mL  30 mL Oral Daily PRN Sarina Ill, DO      ? OLANZapine (ZYPREXA) injection 10 mg  10 mg Intramuscular QHS Sarina Ill, DO   10 mg at 09/11/21 2229  ? Or  ?  OLANZapine (ZYPREXA) tablet 10 mg  10 mg Oral QHS Sarina Ill, DO   10 mg at 09/14/21 2221  ? traZODone (DESYREL) tablet 100 mg  100 mg Oral QHS PRN Sarina Ill, DO      ? ? ?Lab Results: No results found for this or any previous visit (from the past 48 hour(s)). ? ?Blood Alcohol level:  ?Lab Results  ?Component Value Date  ? ETH <10 08/14/2019  ? ? ?Metabolic Disorder Labs: ?No results found for: HGBA1C, MPG ?No results found for: PROLACTIN ?No results found for: CHOL, TRIG, HDL, CHOLHDL, VLDL, LDLCALC ? ?Physical Findings: ?AIMS:  , ,  ,  ,    ?CIWA:    ?COWS:    ? ?Musculoskeletal: ?Strength & Muscle Tone: within normal limits ?Gait & Station: normal ?Patient leans: N/A ? ?Psychiatric Specialty Exam: ? ?Presentation  ?General Appearance:  Appropriate for Environment ? ?Eye Contact:Good ? ?Speech:Clear and Coherent; Normal Rate ? ?Speech Volume:Normal ? ?Handedness:Right ? ? ?Mood and Affect  ?Mood:Euthymic ? ?Affect:Congruent ? ? ?Thought Process  ?Thought Processes:Coherent; Disorganized ? ?Descriptions of Associations:Loose ? ?Orientation:Partial (self and place) ? ?Thought Content:Delusions ? ?History of Schizophrenia/Schizoaffective disorder:No ? ?Duration of Psychotic Symptoms:N/A ? ?Hallucinations:No data recorded ?Ideas of Reference:Delusions ? ?Suicidal Thoughts:No data recorded ?Homicidal Thoughts:No data recorded ? ?Sensorium  ?Memory:Immediate Poor; Remote Poor; Recent Poor ? ?Judgment:Impaired ? ?Insight:Poor ? ? ?Executive Functions  ?Concentration:Fair ? ?Attention Span:Fair ? ?Recall:Poor ? ?Fund of Knowledge:Fair ? ?Language:Good ? ? ?Psychomotor Activity  ?Psychomotor Activity:No data recorded ? ?Assets  ?Assets:Communication Skills; Desire for Improvement; Housing; Social Support ? ? ?Sleep  ?Sleep:No data recorded ? ? ?Physical Exam: ?Physical Exam ?Vitals and nursing note reviewed.  ?Constitutional:   ?   Appearance: Normal appearance. She is normal weight.  ?Neurological:  ?   General: No focal deficit present.  ?   Mental Status: She is alert and oriented to person, place, and time.  ?Psychiatric:     ?   Mood and Affect: Mood normal.     ?   Behavior: Behavior normal.  ? ?Review of Systems  ?Constitutional: Negative.   ?HENT: Negative.    ?Eyes: Negative.   ?Respiratory: Negative.    ?Cardiovascular: Negative.   ?Gastrointestinal: Negative.   ?Genitourinary: Negative.   ?Musculoskeletal: Negative.   ?Skin: Negative.   ?Neurological: Negative.   ?Endo/Heme/Allergies: Negative.   ?Psychiatric/Behavioral:  Positive for depression.   ?Blood pressure (!) 152/73, pulse 87, temperature 97.8 ?F (36.6 ?C), temperature source Oral, resp. rate 20, height 5\' 2"  (1.575 m), weight 47.2 kg, SpO2 100 %. Body mass index is 19.02  kg/m?. ? ? ?Treatment Plan Summary: ?Daily contact with patient to assess and evaluate symptoms and progress in treatment, Medication management, and Plan lipid panel, hemoglobin A1c, and Aricept 5 mg at bedtime. ? ? , DO ?09/15/2021, 11:29 AM ? ?

## 2021-09-15 NOTE — Progress Notes (Signed)
Patient presents alert and oriented to self only.  Patient affect is calm and behavior withdrawn and isolative.  Patient expressed delusions of grandeur stating, "I'm the president."  Patient endorses auditory hallucinations stating, "The voices tell me federal things because I'm the president."  Denies VH, SI, HI, depression, anxiety or pain.  VSS.  Patient compliant with  Zyprexa but refused Aricept stating, "I don't need that."  Educated patient on importance of med compliance to progress towards discharge.  Patient continued to refuse.  Patient spent most of shift isolated to her room.  Ongoing Q15 minute safety check rounds per unit protocol. ?

## 2021-09-15 NOTE — Progress Notes (Signed)
Patient presents is alert and oriented to person. Patient affect is calm and cooperative.  Denies AVH, SI, HI, depression, anxiety or pain Patient reports sleeping well last night. Patient ate breakfast and lunch in the day room with minimal interaction with other peers and staff. Patient noted several times responding to internal stimuli. Patient spent most of shift in day room. Ongoing Q15 minute safety check rounds per unit protocol.  ?

## 2021-09-15 NOTE — BH IP Treatment Plan (Unsigned)
Interdisciplinary Treatment and Diagnostic Plan Update ? ?09/15/2021 ?Time of Session: 7078 ?Carolyn Huynh ?MRN: [id number] ? ?Principal Diagnosis: Severe manic bipolar 1 disorder with psychotic behavior ([place]) ? ?Secondary Diagnoses: Principal Problem: ?  Severe manic bipolar 1 disorder with psychotic behavior ([place]) ? ? ?Current Medications:  ?Current Facility-Administered Medications  ?Medication Dose Route Frequency Provider Last Rate Last Admin  ? alum & mag hydroxide-simeth (MAALOX/MYLANTA) 200-200-20 MG/5ML suspension 30 mL  30 mL Oral Q4H PRN Parks Ranger, DO      ? donepezil (ARICEPT) tablet 5 mg  5 mg Oral QHS Parks Ranger, DO      ? LORazepam (ATIVAN) tablet 1 mg  1 mg Oral TID PRN Parks Ranger, DO      ? Or  ? LORazepam (ATIVAN) injection 1 mg  1 mg Intramuscular TID PRN Parks Ranger, DO      ? magnesium hydroxide (MILK OF MAGNESIA) suspension 30 mL  30 mL Oral Daily PRN Parks Ranger, DO      ? OLANZapine (ZYPREXA) injection 10 mg  10 mg Intramuscular QHS Parks Ranger, DO   10 mg at 09/11/21 2229  ? Or  ? OLANZapine (ZYPREXA) tablet 10 mg  10 mg Oral QHS Parks Ranger, DO   10 mg at 09/14/21 2221  ? traZODone (DESYREL) tablet 100 mg  100 mg Oral QHS PRN Parks Ranger, DO      ? ?PTA Medications: ?Medications Prior to Admission  ?Medication Sig Dispense Refill Last Dose  ? acetaminophen (TYLENOL) 325 MG tablet Take 2 tablets (650 mg total) by mouth every 6 (six) hours as needed for mild pain (or Fever >/= 101). (Patient not taking: Reported on 09/05/2021)     ? hydrochlorothiazide (MICROZIDE) 12.5 MG capsule Take 1 capsule (12.5 mg total) by mouth daily. (Patient not taking: Reported on 09/05/2021) 30 capsule 3   ? ondansetron (ZOFRAN) 4 MG tablet Take 1 tablet (4 mg total) by mouth every 8 (eight) hours as needed for up to 10 doses for nausea or vomiting. (Patient not taking: Reported on 09/05/2021) 10 tablet 0   ? potassium  chloride (K-DUR,KLOR-CON) 10 MEQ tablet Take 1 tablet (10 mEq total) by mouth daily. (Patient not taking: Reported on 09/05/2021) 30 tablet 3   ? ? ?Patient Stressors: Other: Does not know why she is here.   ? ?Patient Strengths: Supportive family/friends  ? ?Treatment Modalities: Medication Management, Group therapy, Case management,  ?1 to 1 session with clinician, Psychoeducation, Recreational therapy. ? ? ?Physician Treatment Plan for Primary Diagnosis: Severe manic bipolar 1 disorder with psychotic behavior ([place]) ?Long Term Goal(s): Improvement in symptoms so as ready for discharge  ? ?Short Term Goals: Compliance with prescribed medications will improve ? ?Medication Management: Evaluate patient's response, side effects, and tolerance of medication regimen. ? ?Therapeutic Interventions: 1 to 1 sessions, Unit Group sessions and Medication administration. ? ?Evaluation of Outcomes: Not Met ? ?Physician Treatment Plan for Secondary Diagnosis: Principal Problem: ?  Severe manic bipolar 1 disorder with psychotic behavior ([place]) ? ?Long Term Goal(s): Improvement in symptoms so as ready for discharge  ? ?Short Term Goals: Compliance with prescribed medications will improve    ? ?Medication Management: Evaluate patient's response, side effects, and tolerance of medication regimen. ? ?Therapeutic Interventions: 1 to 1 sessions, Unit Group sessions and Medication administration. ? ?Evaluation of Outcomes: Not Met ? ? ?RN Treatment Plan for Primary Diagnosis: Severe manic bipolar 1 disorder with psychotic behavior ([place]) ?  Long Term Goal(s): Knowledge of disease and therapeutic regimen to maintain health will improve ? ?Short Term Goals: Ability to remain free from injury will improve, Ability to verbalize frustration and anger appropriately will improve, Ability to demonstrate self-control, Ability to participate in decision making will improve, Ability to verbalize feelings will improve, Ability to disclose and discuss  suicidal ideas, Ability to identify and develop effective coping behaviors will improve, and Compliance with prescribed medications will improve ? ?Medication Management: RN will administer medications as ordered by provider, will assess and evaluate patient's response and provide education to patient for prescribed medication. RN will report any adverse and/or side effects to prescribing provider. ? ?Therapeutic Interventions: 1 on 1 counseling sessions, Psychoeducation, Medication administration, Evaluate responses to treatment, Monitor vital signs and CBGs as ordered, Perform/monitor CIWA, COWS, AIMS and Fall Risk screenings as ordered, Perform wound care treatments as ordered. ? ?Evaluation of Outcomes: Not Met ? ? ?LCSW Treatment Plan for Primary Diagnosis: Severe manic bipolar 1 disorder with psychotic behavior ([place]) ?Long Term Goal(s): Safe transition to appropriate next level of care at discharge, Engage patient in therapeutic group addressing interpersonal concerns. ? ?Short Term Goals: Engage patient in aftercare planning with referrals and resources, Increase social support, Increase ability to appropriately verbalize feelings, Increase emotional regulation, Facilitate acceptance of mental health diagnosis and concerns, Facilitate patient progression through stages of change regarding substance use diagnoses and concerns, and Identify triggers associated with mental health/substance abuse issues ? ?Therapeutic Interventions: Assess for all discharge needs, 1 to 1 time with Education officer, museum, Explore available resources and support systems, Assess for adequacy in community support network, Educate family and significant other(s) on suicide prevention, Complete Psychosocial Assessment, Interpersonal group therapy. ? ?Evaluation of Outcomes: Not Met ? ? ?Progress in Treatment: ?Attending groups: Yes. ?Participating in groups: Yes. ?Taking medication as prescribed: Yes. ?Toleration medication:  Yes. ?Family/Significant other contact made: Yes, individual(s) contacted:  SPE completed with Hilaria Ota ?Patient understands diagnosis: No. ?Discussing patient identified problems/goals with staff: Yes. ?Medical problems stabilized or resolved: Yes. ?Denies suicidal/homicidal ideation: Yes. ?Issues/concerns per patient self-inventory: Yes. ?Other: none  ? ?New problem(s) identified: No, Describe:  No additional problems/concerns identified at this time.  ? ?New Short Term/Long Term Goal(s): Patient to work towards elimination of symptoms of psychosis, medication management for mood stabilization; development of comprehensive mental wellness plan. ? ?Patient Goals: No additional goals identified at this time.  ? ?Discharge Plan or Barriers: No psychosocial barriers identified at this time, patient to return to place of residence with daughter when appropriate for discharge.  ? ?Reason for Continuation of Hospitalization: Mania ? ?Estimated Length of Stay: 1-7 days  ? ? ?Scribe for Treatment Team: ?Larose Kells ?09/15/2021 ?2:19 PM ?

## 2021-09-16 DIAGNOSIS — F312 Bipolar disorder, current episode manic severe with psychotic features: Secondary | ICD-10-CM | POA: Diagnosis not present

## 2021-09-16 NOTE — Progress Notes (Signed)
[facility] [place] MD Progress Note ? ?09/16/2021 10:47 AM ?Carolyn Huynh  ?MRN:  [id number] ?Subjective: Carolyn Huynh was seen talking to herself when I interviewed her.  She refused Aricept last night.  Otherwise, she is pleasant and cooperative.  She is still delusional and tangential in her thought process but will answer questions appropriately.  I believe she has a vascular dementia on top of her schizophrenia so, Aricept probably would not help much anyway.  But we will continue to see if she will take it. ? ?Principal Problem: Severe manic bipolar 1 disorder with psychotic behavior (HCC) ?Diagnosis: Principal Problem: ?  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 ?Total Time spent with patient: 15 minutes ? ?Past Psychiatric History: Schizophrenia ? ?Past Medical History:  ?Past Medical History:  ?Diagnosis Date  ? Asthma   ? COPD (chronic obstructive pulmonary disease) (HCC)   ? Dementia (HCC)   ? Hypertension   ? Pneumothorax   ?  ?Past Surgical History:  ?Procedure Laterality Date  ? CHEST TUBE INSERTION    ? VIDEO ASSISTED THORACOSCOPY Right 02/01/2018  ? Procedure: VIDEO ASSISTED THORACOSCOPY, BLEB STAPLING;  Surgeon: Kerin Perna, MD;  Location: [facility] OR;  Service: Thoracic;  Laterality: Right;  ? ?Family History: History reviewed. No pertinent family history. ? ?Social History:  ?Social History  ? ?Substance and Sexual Activity  ?Alcohol Use Not Currently  ?   ?Social History  ? ?Substance and Sexual Activity  ?Drug Use Never  ?  ?Social History  ? ?Socioeconomic History  ? Marital status: Significant Other  ?  Spouse name: Not on file  ? Number of children: Not on file  ? Years of education: Not on file  ? Highest education level: Not on file  ?Occupational History  ? Not on file  ?Tobacco Use  ? Smoking status: Never  ? Smokeless tobacco: Never  ?Vaping Use  ? Vaping Use: Never used  ?Substance and Sexual Activity  ? Alcohol use: Not Currently  ? Drug use: Never  ? Sexual activity: Not Currently   ?Other Topics Concern  ? Not on file  ?Social History Narrative  ? Not on file  ? ?Social Determinants of Health  ? ?Financial Resource Strain: Not on file  ?Food Insecurity: Not on file  ?Transportation Needs: Not on file  ?Physical Activity: Not on file  ?Stress: Not on file  ?Social Connections: Not on file  ? ?Additional Social History:  ?  ?  ?  ?  ?  ?  ?  ?  ?  ?  ?  ? ?Sleep: Good ? ?Appetite:  Fair ? ?Current Medications: ?Current Facility-Administered Medications  ?Medication Dose Route Frequency Provider Last Rate Last Admin  ? alum & mag hydroxide-simeth (MAALOX/MYLANTA) 200-200-20 MG/5ML suspension 30 mL  30 mL Oral Q4H PRN Sarina Ill, DO      ? donepezil (ARICEPT) tablet 5 mg  5 mg Oral QHS Sarina Ill, DO      ? LORazepam (ATIVAN) tablet 1 mg  1 mg Oral TID PRN Sarina Ill, DO      ? Or  ? LORazepam (ATIVAN) injection 1 mg  1 mg Intramuscular TID PRN Sarina Ill, DO      ? magnesium hydroxide (MILK OF MAGNESIA) suspension 30 mL  30 mL Oral Daily PRN Sarina Ill, DO      ? OLANZapine (ZYPREXA) injection 10 mg  10 mg Intramuscular QHS Sarina Ill, DO  10 mg at 09/11/21 2229  ? Or  ? OLANZapine (ZYPREXA) tablet 10 mg  10 mg Oral QHS Sarina Ill, DO   10 mg at 09/15/21 2106  ? traZODone (DESYREL) tablet 100 mg  100 mg Oral QHS PRN Sarina Ill, DO      ? ? ?Lab Results:  ?Results for orders placed or performed during the hospital encounter of 09/09/21 (from the past 48 hour(s))  ?Lipid panel     Status: None  ? Collection Time: 09/15/21 12:45 PM  ?Result Value Ref Range  ? Cholesterol 137 0 - 200 mg/dL  ? Triglycerides 49 <150 mg/dL  ? HDL 56 >40 mg/dL  ? Total CHOL/HDL Ratio 2.4 RATIO  ? VLDL 10 0 - 40 mg/dL  ? LDL Cholesterol 71 0 - 99 mg/dL  ?  Comment:        ?Total Cholesterol/HDL:CHD Risk ?Coronary Heart Disease Risk Table ?                    Men   Women ? 1/2 Average Risk   3.4   3.3 ? Average Risk        5.0   4.4 ? 2 X Average Risk   9.6   7.1 ? 3 X Average Risk  23.4   11.0 ?       ?Use the calculated Patient Ratio ?above and the CHD Risk Table ?to determine the patient's CHD Risk. ?       ?ATP III CLASSIFICATION (LDL): ? <100     mg/dL   Optimal ? 081-448  mg/dL   Near or Above ?                   Optimal ? 130-159  mg/dL   Borderline ? 160-189  mg/dL   High ? >185     mg/dL   Very High ?Performed at [facility], [address], [place], [place] [address] ?  ? ? ?Blood Alcohol level:  ?Lab Results  ?Component Value Date  ? ETH <10 08/14/2019  ? ? ?Metabolic Disorder Labs: ?No results found for: HGBA1C, MPG ?No results found for: PROLACTIN ?Lab Results  ?Component Value Date  ? CHOL 137 09/15/2021  ? TRIG 49 09/15/2021  ? HDL 56 09/15/2021  ? CHOLHDL 2.4 09/15/2021  ? VLDL 10 09/15/2021  ? LDLCALC 71 09/15/2021  ? ? ?Physical Findings: ?AIMS:  , ,  ,  ,    ?CIWA:    ?COWS:    ? ?Musculoskeletal: ?Strength & Muscle Tone: within normal limits ?Gait & Station: normal ?Patient leans: N/A ? ?Psychiatric Specialty Exam: ? ?Presentation  ?General Appearance: Appropriate for Environment ? ?Eye Contact:Good ? ?Speech:Clear and Coherent; Normal Rate ? ?Speech Volume:Normal ? ?Handedness:Right ? ? ?Mood and Affect  ?Mood:Euthymic ? ?Affect:Congruent ? ? ?Thought Process  ?Thought Processes:Coherent; Disorganized ? ?Descriptions of Associations:Loose ? ?Orientation:Partial (self and place) ? ?Thought Content:Delusions ? ?History of Schizophrenia/Schizoaffective disorder:No ? ?Duration of Psychotic Symptoms:N/A ? ?Hallucinations:No data recorded ?Ideas of Reference:Delusions ? ?Suicidal Thoughts:No data recorded ?Homicidal Thoughts:No data recorded ? ?Sensorium  ?Memory:Immediate Poor; Remote Poor; Recent Poor ? ?Judgment:Impaired ? ?Insight:Poor ? ? ?Executive Functions  ?Concentration:Fair ? ?Attention Span:Fair ? ?Recall:Poor ? ?Fund of Knowledge:Fair ? ?Language:Good ? ? ?Psychomotor Activity  ?Psychomotor  Activity:No data recorded ? ?Assets  ?Assets:Communication Skills; Desire for Improvement; Housing; Social Support ? ? ?Sleep  ?Sleep:No data recorded ? ? ?Physical Exam: ?Physical Exam ?Vitals and nursing note  reviewed.  ?Constitutional:   ?   Appearance: Normal appearance. She is normal weight.  ?Neurological:  ?   General: No focal deficit present.  ?   Mental Status: She is alert and oriented to person, place, and time.  ?Psychiatric:     ?   Attention and Perception: She perceives auditory hallucinations.     ?   Mood and Affect: Mood normal. Affect is inappropriate.     ?   Speech: Speech normal.     ?   Behavior: Behavior normal. Behavior is cooperative.     ?   Thought Content: Thought content is delusional.     ?   Cognition and Memory: Cognition is impaired. Memory is impaired.     ?   Judgment: Judgment is inappropriate.  ? ?Review of Systems  ?Constitutional: Negative.   ?HENT: Negative.    ?Eyes: Negative.   ?Respiratory: Negative.    ?Cardiovascular: Negative.   ?Gastrointestinal: Negative.   ?Genitourinary: Negative.   ?Musculoskeletal: Negative.   ?Skin: Negative.   ?Neurological: Negative.   ?Endo/Heme/Allergies: Negative.   ?Psychiatric/Behavioral:  Positive for hallucinations and memory loss.   ?Blood pressure (!) 143/90, pulse 76, temperature 97.9 ?F (36.6 ?C), temperature source Oral, resp. rate 20, height 5\' 2"  (1.575 m), weight 47.2 kg, SpO2 100 %. Body mass index is 19.02 kg/m?. ? ? ?Treatment Plan Summary: ?Daily contact with patient to assess and evaluate symptoms and progress in treatment, Medication management, and Plan continue current medications.  Encourage med compliance. ? ?Sarina Illichard Edward Bethanne Mule, DO ?09/16/2021, 10:47 AM ? ?

## 2021-09-16 NOTE — Progress Notes (Signed)
Recreation Therapy Notes ? ?Date: 09/16/2021 ?  ?Time: 1:05 pm    ?  ?Location: Court yard  ?  ?Behavioral response: Appropriate ?  ?Intervention Topic:  Leisure   ?  ?Discussion/Intervention:  ?Group content today was focused on leisure. The group defined what leisure is and some positive leisure activities they participate in. Individuals identified the difference between good and bad leisure. Participants expressed how they feel after participating in the leisure of their choice. The group discussed how they go about picking a leisure activity and if others are involved in their leisure activities. The patient stated how many leisure activities they have to choose from and reasons why it is important to have leisure time. Individuals participated in the intervention ?Exploration of Leisure? where they had a chance to identify new leisure activities as well as benefits of leisure. ?Clinical Observations/Feedback: ?Patient came to group late and was focused on what peers and staff had to say about leisure. Participant participated in open leisure while being appropriate with staff and peers. Individual was social with peers and staff while participating in the intervention.    ?Telisa Ohlsen LRT/CTRS  ? ? ? ? ? ? ? ?Carolyn Huynh ?09/16/2021 2:02 PM ?

## 2021-09-16 NOTE — Progress Notes (Signed)
Patient is alert and oriented to person. Patient affect is calm and cooperative.  Denies AVH, SI, HI, depression, anxiety or pain. Patient reports sleeping well last night. Patient ate breakfast and lunch in the day room with minimal interaction with other peers and staff. Patient spent most of the shift in the dayroom watching TV. Patient noted at different intervals responding to internal stimuli. Ongoing Q15 minute safety check rounds per unit protocol.  ?

## 2021-09-16 NOTE — [facility] Group Notes (Signed)
[facility] Group Notes:  (Nursing/MHT/Case Management/Adjunct) ? ?Date:  09/16/2021  ?Time:  10:00 AM ? ?Type of Therapy:  Psychoeducational Skills ? ?Participation Level:  Active ? ?Participation Quality:  Appropriate and Attentive ? ?Affect:  Appropriate ? ?Cognitive:  Appropriate ? ?Insight:  Appropriate and Good ? ?Engagement in Group:  Engaged ? ?Modes of Intervention:  Discussion ? ?Summary of Progress/Problems: ?The pt attended group and was very engaged. The pt was the first to share when opening the floor for discussion. ?[name] ?09/16/2021, 3:05 PM ?

## 2021-09-17 DIAGNOSIS — F312 Bipolar disorder, current episode manic severe with psychotic features: Secondary | ICD-10-CM | POA: Diagnosis not present

## 2021-09-17 LAB — HEMOGLOBIN A1C
Hgb A1c MFr Bld: 5.7 % — ABNORMAL HIGH (ref 4.8–5.6)
Mean Plasma Glucose: 117 mg/dL

## 2021-09-17 NOTE — Progress Notes (Signed)
[facility] MD Progress Note ? ?09/17/2021 1:56 PM ?Carolyn Huynh  ?MRN:  [id number] ?Subjective: Carolyn Huynh says that she is doing well.  She refused Aricept last night and her Zyprexa so she required IM Zyprexa.  She does have force meds.  I will go ahead and stop her Aricept since she most likely has vascular dementia.  She continues to be delusional and talking to people that are not there.  We have not heard from the daughter.  I am not sure whether she has been in to visit. ? ?Principal Problem: Severe manic bipolar 1 disorder with psychotic behavior (HCC) ?Diagnosis: Principal Problem: ?  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 ?Total Time spent with patient: 15 minutes ? ?Past Psychiatric History: Schizophrenia ? ?Past Medical History:  ?Past Medical History:  ?Diagnosis Date  ? Asthma   ? COPD (chronic obstructive pulmonary disease) (HCC)   ? Dementia (HCC)   ? Hypertension   ? Pneumothorax   ?  ?Past Surgical History:  ?Procedure Laterality Date  ? CHEST TUBE INSERTION    ? VIDEO ASSISTED THORACOSCOPY Right 02/01/2018  ? Procedure: VIDEO ASSISTED THORACOSCOPY, BLEB STAPLING;  Surgeon: Kerin Perna, MD;  Location: [facility] OR;  Service: Thoracic;  Laterality: Right;  ? ?Family History: History reviewed. No pertinent family history. ? ?Social History:  ?Social History  ? ?Substance and Sexual Activity  ?Alcohol Use Not Currently  ?   ?Social History  ? ?Substance and Sexual Activity  ?Drug Use Never  ?  ?Social History  ? ?Socioeconomic History  ? Marital status: Significant Other  ?  Spouse name: Not on file  ? Number of children: Not on file  ? Years of education: Not on file  ? Highest education level: Not on file  ?Occupational History  ? Not on file  ?Tobacco Use  ? Smoking status: Never  ? Smokeless tobacco: Never  ?Vaping Use  ? Vaping Use: Never used  ?Substance and Sexual Activity  ? Alcohol use: Not Currently  ? Drug use: Never  ? Sexual activity: Not Currently  ?Other Topics Concern  ?  Not on file  ?Social History Narrative  ? Not on file  ? ?Social Determinants of Health  ? ?Financial Resource Strain: Not on file  ?Food Insecurity: Not on file  ?Transportation Needs: Not on file  ?Physical Activity: Not on file  ?Stress: Not on file  ?Social Connections: Not on file  ? ?Additional Social History:  ?  ?  ?  ?  ?  ?  ?  ?  ?  ?  ?  ? ?Sleep: Good ? ?Appetite:  Good ? ?Current Medications: ?Current Facility-Administered Medications  ?Medication Dose Route Frequency Provider Last Rate Last Admin  ? alum & mag hydroxide-simeth (MAALOX/MYLANTA) 200-200-20 MG/5ML suspension 30 mL  30 mL Oral Q4H PRN Sarina Ill, DO      ? donepezil (ARICEPT) tablet 5 mg  5 mg Oral QHS Sarina Ill, DO      ? LORazepam (ATIVAN) tablet 1 mg  1 mg Oral TID PRN Sarina Ill, DO      ? Or  ? LORazepam (ATIVAN) injection 1 mg  1 mg Intramuscular TID PRN Sarina Ill, DO      ? magnesium hydroxide (MILK OF MAGNESIA) suspension 30 mL  30 mL Oral Daily PRN Sarina Ill, DO      ? OLANZapine (ZYPREXA) injection 10 mg  10 mg Intramuscular QHS Marlou Porch,  Lanny Crampichard Edward, DO   10 mg at 09/16/21 2200  ? Or  ? OLANZapine (ZYPREXA) tablet 10 mg  10 mg Oral QHS Sarina IllHerrick, Rohen Kimes Edward, DO   10 mg at 09/15/21 2106  ? traZODone (DESYREL) tablet 100 mg  100 mg Oral QHS PRN Sarina IllHerrick, Viann Nielson Edward, DO      ? ? ?Lab Results:  ?Results for orders placed or performed during the hospital encounter of 09/09/21 (from the past 48 hour(s))  ?Hemoglobin A1c     Status: Abnormal  ? Collection Time: 09/16/21  6:22 AM  ?Result Value Ref Range  ? Hgb A1c MFr Bld 5.7 (H) 4.8 - 5.6 %  ?  Comment: (NOTE) ?        Prediabetes: 5.7 - 6.4 ?        Diabetes: >6.4 ?        Glycemic control for adults with diabetes: <7.0 ?  ? Mean Plasma Glucose 117 mg/dL  ?  Comment: (NOTE) ?Performed At: [facility] ?[address] [place], [place] [id number] ?Jolene SchimkeNagendra Sanjai MD VQ:[phone] ?  ? ? ?Blood Alcohol  level:  ?Lab Results  ?Component Value Date  ? ETH <10 08/14/2019  ? ? ?Metabolic Disorder Labs: ?Lab Results  ?Component Value Date  ? HGBA1C 5.7 (H) 09/16/2021  ? MPG 117 09/16/2021  ? ?No results found for: PROLACTIN ?Lab Results  ?Component Value Date  ? CHOL 137 09/15/2021  ? TRIG 49 09/15/2021  ? HDL 56 09/15/2021  ? CHOLHDL 2.4 09/15/2021  ? VLDL 10 09/15/2021  ? LDLCALC 71 09/15/2021  ? ? ?Physical Findings: ?AIMS:  , ,  ,  ,    ?CIWA:    ?COWS:    ? ?Musculoskeletal: ?Strength & Muscle Tone: within normal limits ?Gait & Station: normal ?Patient leans: N/A ? ?Psychiatric Specialty Exam: ? ?Presentation  ?General Appearance: Appropriate for Environment ? ?Eye Contact:Good ? ?Speech:Clear and Coherent; Normal Rate ? ?Speech Volume:Normal ? ?Handedness:Right ? ? ?Mood and Affect  ?Mood:Euthymic ? ?Affect:Congruent ? ? ?Thought Process  ?Thought Processes:Coherent; Disorganized ? ?Descriptions of Associations:Loose ? ?Orientation:Partial (self and place) ? ?Thought Content:Delusions ? ?History of Schizophrenia/Schizoaffective disorder:No ? ?Duration of Psychotic Symptoms:N/A ? ?Hallucinations:No data recorded ?Ideas of Reference:Delusions ? ?Suicidal Thoughts:No data recorded ?Homicidal Thoughts:No data recorded ? ?Sensorium  ?Memory:Immediate Poor; Remote Poor; Recent Poor ? ?Judgment:Impaired ? ?Insight:Poor ? ? ?Executive Functions  ?Concentration:Fair ? ?Attention Span:Fair ? ?Recall:Poor ? ?Fund of Knowledge:Fair ? ?Language:Good ? ? ?Psychomotor Activity  ?Psychomotor Activity:No data recorded ? ?Assets  ?Assets:Communication Skills; Desire for Improvement; Housing; Social Support ? ? ?Sleep  ?Sleep:No data recorded ? ? ?Physical Exam: ?Physical Exam ?Vitals and nursing note reviewed.  ?Constitutional:   ?   Appearance: Normal appearance. She is normal weight.  ?Neurological:  ?   General: No focal deficit present.  ?   Mental Status: She is alert and oriented to person, place, and time.  ?Psychiatric:      ?   Attention and Perception: Attention normal. She perceives auditory hallucinations.     ?   Mood and Affect: Mood normal.     ?   Speech: Speech normal.     ?   Behavior: Behavior normal. Behavior is cooperative.     ?   Thought Content: Thought content is paranoid and delusional.     ?   Cognition and Memory: Cognition is impaired.     ?   Judgment: Judgment is impulsive and inappropriate.  ? ?Review of Systems  ?  Constitutional: Negative.   ?HENT: Negative.    ?Eyes: Negative.   ?Respiratory: Negative.    ?Cardiovascular: Negative.   ?Gastrointestinal: Negative.   ?Genitourinary: Negative.   ?Musculoskeletal: Negative.   ?Skin: Negative.   ?Neurological: Negative.   ?Endo/Heme/Allergies: Negative.   ?Psychiatric/Behavioral: Negative.    ?Blood pressure 139/85, pulse 76, temperature 97.6 ?F (36.4 ?C), temperature source Oral, resp. rate 18, height 5\' 2"  (1.575 m), weight 47.2 kg, SpO2 100 %. Body mass index is 19.02 kg/m?. ? ? ?Treatment Plan Summary: ?Daily contact with patient to assess and evaluate symptoms and progress in treatment, Medication management, and Plan discontinue Aricept and continue forced meds with Zyprexa. ? ? , DO ?09/17/2021, 1:56 PM ? ?

## 2021-09-17 NOTE — Progress Notes (Signed)
Patient denies SI, HI, and AVH. She is calm and cooperative with assessment. Patient denies pain and other physical problems. Patient does not have scheduled medications. Patient is minimal in interaction and comes in and out of the dayroom throughout the morning. Patient remains safe on the unit at this time. ?

## 2021-09-17 NOTE — Progress Notes (Addendum)
Patient presents alert and oriented to self only.  Patient affect is calm and behavior withdrawn and isolative.  Patient appears to responding to internal stimuli as she is talking to herself in day room.  Upon assessment patient endorses auditory hallucinations stating, "I'm in the middle of talking to someone" as she points to her ear.  Denies VH, SI, HI, depression, anxiety or pain.  VSS.  Patient refused PO Zyprexa, consented to IM injection which was given without incident.   Ongoing Q15 minute safety check rounds per unit protocol. ?

## 2021-09-18 DIAGNOSIS — F0152 Vascular dementia, unspecified severity, with psychotic disturbance: Secondary | ICD-10-CM | POA: Diagnosis present

## 2021-09-18 DIAGNOSIS — F312 Bipolar disorder, current episode manic severe with psychotic features: Secondary | ICD-10-CM | POA: Diagnosis not present

## 2021-09-18 NOTE — Progress Notes (Signed)
[facility] MD Progress Note ? ?09/18/2021 11:32 AM ?Carolyn Huynh  ?MRN:  [id number] ?Subjective: Carolyn Huynh continues to be delusional and it appears to be fixed.  She did take her Zyprexa by mouth last night.  Her behavior on the unit has been pleasant.  She continues to respond to internal stimuli but is very guarded when asked about it. ?No evidence of any side effects from her medicine.  Hemogobin A1c was 5.7 and lipid panel was within normal limits. ? ?Principal Problem: Severe manic bipolar 1 disorder with psychotic behavior (HCC) ?Diagnosis: Principal Problem: ?  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 ?Total Time spent with patient: 15 minutes ? ?Past Psychiatric History: Schizophrenia ? ?Past Medical History:  ?Past Medical History:  ?Diagnosis Date  ? Asthma   ? COPD (chronic obstructive pulmonary disease) (HCC)   ? Dementia (HCC)   ? Hypertension   ? Pneumothorax   ?  ?Past Surgical History:  ?Procedure Laterality Date  ? CHEST TUBE INSERTION    ? VIDEO ASSISTED THORACOSCOPY Right 02/01/2018  ? Procedure: VIDEO ASSISTED THORACOSCOPY, BLEB STAPLING;  Surgeon: Kerin Perna, MD;  Location: [facility] OR;  Service: Thoracic;  Laterality: Right;  ? ?Family History: History reviewed. No pertinent family history. ? ?Social History:  ?Social History  ? ?Substance and Sexual Activity  ?Alcohol Use Not Currently  ?   ?Social History  ? ?Substance and Sexual Activity  ?Drug Use Never  ?  ?Social History  ? ?Socioeconomic History  ? Marital status: Significant Other  ?  Spouse name: Not on file  ? Number of children: Not on file  ? Years of education: Not on file  ? Highest education level: Not on file  ?Occupational History  ? Not on file  ?Tobacco Use  ? Smoking status: Never  ? Smokeless tobacco: Never  ?Vaping Use  ? Vaping Use: Never used  ?Substance and Sexual Activity  ? Alcohol use: Not Currently  ? Drug use: Never  ? Sexual activity: Not Currently  ?Other Topics Concern  ? Not on file  ?Social  History Narrative  ? Not on file  ? ?Social Determinants of Health  ? ?Financial Resource Strain: Not on file  ?Food Insecurity: Not on file  ?Transportation Needs: Not on file  ?Physical Activity: Not on file  ?Stress: Not on file  ?Social Connections: Not on file  ? ?Additional Social History:  ?  ?  ?  ?  ?  ?  ?  ?  ?  ?  ?  ? ?Sleep: Good ? ?Appetite:  Fair ? ?Current Medications: ?Current Facility-Administered Medications  ?Medication Dose Route Frequency Provider Last Rate Last Admin  ? alum & mag hydroxide-simeth (MAALOX/MYLANTA) 200-200-20 MG/5ML suspension 30 mL  30 mL Oral Q4H PRN Sarina Ill, DO      ? LORazepam (ATIVAN) tablet 1 mg  1 mg Oral TID PRN Sarina Ill, DO      ? Or  ? LORazepam (ATIVAN) injection 1 mg  1 mg Intramuscular TID PRN Sarina Ill, DO      ? magnesium hydroxide (MILK OF MAGNESIA) suspension 30 mL  30 mL Oral Daily PRN Sarina Ill, DO      ? OLANZapine (ZYPREXA) injection 10 mg  10 mg Intramuscular QHS Sarina Ill, DO   10 mg at 09/16/21 2200  ? Or  ? OLANZapine (ZYPREXA) tablet 10 mg  10 mg Oral QHS Sarina Ill, DO  10 mg at 09/17/21 2110  ? traZODone (DESYREL) tablet 100 mg  100 mg Oral QHS PRN Sarina Ill, DO      ? ? ?Lab Results: No results found for this or any previous visit (from the past 48 hour(s)). ? ?Blood Alcohol level:  ?Lab Results  ?Component Value Date  ? ETH <10 08/14/2019  ? ? ?Metabolic Disorder Labs: ?Lab Results  ?Component Value Date  ? HGBA1C 5.7 (H) 09/16/2021  ? MPG 117 09/16/2021  ? ?No results found for: PROLACTIN ?Lab Results  ?Component Value Date  ? CHOL 137 09/15/2021  ? TRIG 49 09/15/2021  ? HDL 56 09/15/2021  ? CHOLHDL 2.4 09/15/2021  ? VLDL 10 09/15/2021  ? LDLCALC 71 09/15/2021  ? ? ?Physical Findings: ?AIMS:  , ,  ,  ,    ?CIWA:    ?COWS:    ? ?Musculoskeletal: ?Strength & Muscle Tone: within normal limits ?Gait & Station: normal ?Patient leans: N/A ? ?Psychiatric  Specialty Exam: ? ?Presentation  ?General Appearance: Appropriate for Environment ? ?Eye Contact:Good ? ?Speech:Clear and Coherent; Normal Rate ? ?Speech Volume:Normal ? ?Handedness:Right ? ? ?Mood and Affect  ?Mood:Euthymic ? ?Affect:Congruent ? ? ?Thought Process  ?Thought Processes:Coherent; Disorganized ? ?Descriptions of Associations:Loose ? ?Orientation:Partial (self and place) ? ?Thought Content:Delusions ? ?History of Schizophrenia/Schizoaffective disorder:No ? ?Duration of Psychotic Symptoms:N/A ? ?Hallucinations:No data recorded ?Ideas of Reference:Delusions ? ?Suicidal Thoughts:No data recorded ?Homicidal Thoughts:No data recorded ? ?Sensorium  ?Memory:Immediate Poor; Remote Poor; Recent Poor ? ?Judgment:Impaired ? ?Insight:Poor ? ? ?Executive Functions  ?Concentration:Fair ? ?Attention Span:Fair ? ?Recall:Poor ? ?Fund of Knowledge:Fair ? ?Language:Good ? ? ?Psychomotor Activity  ?Psychomotor Activity:No data recorded ? ?Assets  ?Assets:Communication Skills; Desire for Improvement; Housing; Social Support ? ? ?Sleep  ?Sleep:No data recorded ? ? ?Physical Exam: ?Physical Exam ?Vitals and nursing note reviewed.  ?Constitutional:   ?   Appearance: Normal appearance. She is normal weight.  ?Neurological:  ?   General: No focal deficit present.  ?   Mental Status: She is alert and oriented to person, place, and time.  ?Psychiatric:     ?   Attention and Perception: Attention normal. She perceives auditory hallucinations.     ?   Mood and Affect: Mood normal. Affect is labile.     ?   Speech: Speech normal.     ?   Behavior: Behavior normal. Behavior is cooperative.     ?   Thought Content: Thought content is delusional.     ?   Cognition and Memory: Cognition is impaired. Memory is impaired.     ?   Judgment: Judgment is inappropriate.  ? ?Review of Systems  ?Constitutional: Negative.   ?HENT: Negative.    ?Eyes: Negative.   ?Respiratory: Negative.    ?Cardiovascular: Negative.   ?Gastrointestinal: Negative.    ?Genitourinary: Negative.   ?Musculoskeletal: Negative.   ?Skin: Negative.   ?Neurological: Negative.   ?Endo/Heme/Allergies: Negative.   ?Psychiatric/Behavioral:  Positive for memory loss.   ?Blood pressure (!) 142/67, pulse 87, temperature 97.8 ?F (36.6 ?C), temperature source Oral, resp. rate 16, height 5\' 2"  (1.575 m), weight 47.2 kg, SpO2 100 %. Body mass index is 19.02 kg/m?. ? ? ?Treatment Plan Summary: ?Daily contact with patient to assess and evaluate symptoms and progress in treatment, Medication management, and Plan continue current medications. ? ? , DO ?09/18/2021, 11:32 AM ? ?

## 2021-09-18 NOTE — [facility] Group Notes (Signed)
[facility] Group Notes:  (Nursing/MHT/Case Management/Adjunct) ? ?Date: 09/17/2021 ?Time:  10:00 AM ? ?Type of Therapy:  Psychoeducational Skills ? ?Participation Level:  Did Not Attend ? ?Participation Quality:   N/A ? ?Affect:   N/A ? ?Cognitive:   N/A ? ?Insight:  None ? ?Engagement in Group:   N/A ? ?Modes of Intervention:   N/A ? ?Summary of Progress/Problems: ?The pt stated they would attend group but decided to remain in bedroom. ?Carolyn Huynh ?09/18/2021, 11:17 AM ?

## 2021-09-18 NOTE — Progress Notes (Signed)
The patient was in the dayroom during the early part of the shift, watching a movie with her peers. The patient was observed responding to internal stimuli. When asked who she was talking to the patient did not respond. She was compliant with her HS medication. She has slept well this shift. Angelique Blonder SI/HI, and Visual hallucination. She is having some auditory hallucinations without commands.  ? ?Angelina Sheriff, RN ? ?

## 2021-09-18 NOTE — Group Note (Signed)
[facility] LCSW Group Therapy Note ? ? ?Group Date: 09/18/2021 ?Start Time: 1415 ?End Time: [id number] ? ? ?Type of Therapy/Topic:  Group Therapy:  Emotion Regulation ? ?Participation Level:  Minimal  ? ?Mood: ? ?Description of Group:   ? The purpose of this group is to assist patients in learning to regulate negative emotions and experience positive emotions. Patients will be guided to discuss ways in which they have been vulnerable to their negative emotions. These vulnerabilities will be juxtaposed with experiences of positive emotions or situations, and patients challenged to use positive emotions to combat negative ones. Special emphasis will be placed on coping with negative emotions in conflict situations, and patients will process healthy conflict resolution skills. ? ?Therapeutic Goals: ?Patient will identify two positive emotions or experiences to reflect on in order to balance out negative emotions:  ?Patient will label two or more emotions that they find the most difficult to experience:  ?Patient will be able to demonstrate positive conflict resolution skills through discussion or role plays:  ? ?Summary of Patient Progress: ? ? ?Patient was present for the entirety of the group session. Patient was an active listener and participated in the topic of discussion, provided helpful advice to others, and added nuance to topic of conversation. CSW led patients through emotional regulation activity  to facilitate discussion and illicit thoughts and feelings regarding topic. When asked what  bad habits she wanted to change she stated she wanted to go from regular to decaf coffee. Patient was observed responding to internal stimuli throughout session. ? ?Therapeutic Modalities:   ?Cognitive Behavioral Therapy ?Feelings Identification ?Dialectical Behavioral Therapy ? ? ?Honest Safranek A [place], LCSWA ?

## 2021-09-18 NOTE — Progress Notes (Signed)
Recreation Therapy Notes ? ?Date: 09/18/2021 ? ?Time: 10:00 AM ?  ?Location: Day room   ? ?Behavioral response: N/A ?  ?Intervention Topic: Animal Assisted therapy   ? ?Discussion/Intervention: ?Patient refused to attend group. ?  ?Clinical Observations/Feedback:  ?Patient refused to attend group. ?  ?Dejohn Ibarra LRT/CTRS ? ? ? ? ? ? ? ?Carolyn Huynh ?09/18/2021 12:46 PM ?

## 2021-09-18 NOTE — Progress Notes (Addendum)
Patient denies SI, HI, and AVH. However, she is observed to be talking to herself. She denies pain and other concerns. Patient is appropriate with staff and other patients on the unit. Patient is calm and pleasant. Patient remains safe on the unit at this time.  ?

## 2021-09-19 DIAGNOSIS — F312 Bipolar disorder, current episode manic severe with psychotic features: Secondary | ICD-10-CM | POA: Diagnosis not present

## 2021-09-19 NOTE — Progress Notes (Signed)
Patient is observed in the dayroom interacting appropriately on the milieu. She denies SI, HI, AVH, anxiety, and depression. She is medication compliant and voiced no complaints. She is safe on the unit at this time. Q 15 minute safety checks in place.  ?

## 2021-09-19 NOTE — Progress Notes (Signed)
Recreation Therapy Notes ? ?Date: 09/19/2021 ? ?Time: 1:25 PM   ? ?Location: Day room   ? ?Behavioral response: N/A ?  ?Intervention Topic: Social Skills   ? ?Discussion/Intervention: ?Patient refused to attend group.  ? ?Clinical Observations/Feedback:  ?Patient refused to attend group.  ?  ?Akshitha Culmer LRT/CTRS ? ? ? ? ? ? ? ?Lorimer Tiberio ?09/19/2021 3:30 PM ?

## 2021-09-19 NOTE — Plan of Care (Signed)
Patient presents alert and oriented to self and place, disoriented to situation. Patient thoughts are delusional and paranoia. Patient denies SI, HI, and denies AVH though observed responding to voices that are not there. Patient denies depression, anxiety or pain. Patient ate breakfast and lunch in the day room and spent most of shift in day room. Ongoing Q15 minutes safety check rounds per unit protocol.  ? ? ?Problem: Education: ?Goal: Knowledge of [facility] Education information/materials will improve ?Outcome: Progressing ?Goal: Emotional status will improve ?Outcome: Progressing ?Goal: Mental status will improve ?Outcome: Progressing ?Goal: Verbalization of understanding the information provided will improve ?Outcome: Progressing ?  ?Problem: Activity: ?Goal: Interest or engagement in activities will improve ?Outcome: Progressing ?Goal: Sleeping patterns will improve ?Outcome: Progressing ?  ?Problem: Coping: ?Goal: Ability to verbalize frustrations and anger appropriately will improve ?Outcome: Progressing ?Goal: Ability to demonstrate self-control will improve ?Outcome: Progressing ?  ?Problem: Health Behavior/Discharge Planning: ?Goal: Identification of resources available to assist in meeting health care needs will improve ?Outcome: Progressing ?Goal: Compliance with treatment plan for underlying cause of condition will improve ?Outcome: Progressing ?  ?Problem: Physical Regulation: ?Goal: Ability to maintain clinical measurements within normal limits will improve ?Outcome: Progressing ?  ?Problem: Safety: ?Goal: Periods of time without injury will increase ?Outcome: Progressing ?  ?

## 2021-09-19 NOTE — Progress Notes (Signed)
[facility] MD Progress Note ? ?09/19/2021 10:59 AM ?Carolyn Huynh  ?MRN:  [id number] ?Subjective:  Carolyn Huynh was seen on rounds today.  She continues to be delusional but pleasant and cooperative.  She has been compliant with Zyprexa but refuses to take anything else.  She will probably need a long-acting injection before discharge. ? ?Principal Problem: Schizophrenia, paranoid type (HCC) ?Diagnosis: Principal Problem: ?  Schizophrenia, paranoid type (HCC) ?Active Problems: ?  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  Vascular dementia with delusions (HCC) ? ?Total Time spent with patient: 15 minutes ? ?Past Psychiatric History: Schizophrenia ? ?Past Medical History:  ?Past Medical History:  ?Diagnosis Date  ? Asthma   ? COPD (chronic obstructive pulmonary disease) (HCC)   ? Dementia (HCC)   ? Hypertension   ? Pneumothorax   ?  ?Past Surgical History:  ?Procedure Laterality Date  ? CHEST TUBE INSERTION    ? VIDEO ASSISTED THORACOSCOPY Right 02/01/2018  ? Procedure: VIDEO ASSISTED THORACOSCOPY, BLEB STAPLING;  Surgeon: Kerin Perna, MD;  Location: [facility] OR;  Service: Thoracic;  Laterality: Right;  ? ?Family History: History reviewed. No pertinent family history. ? ?Social History:  ?Social History  ? ?Substance and Sexual Activity  ?Alcohol Use Not Currently  ?   ?Social History  ? ?Substance and Sexual Activity  ?Drug Use Never  ?  ?Social History  ? ?Socioeconomic History  ? Marital status: Significant Other  ?  Spouse name: Not on file  ? Number of children: Not on file  ? Years of education: Not on file  ? Highest education level: Not on file  ?Occupational History  ? Not on file  ?Tobacco Use  ? Smoking status: Never  ? Smokeless tobacco: Never  ?Vaping Use  ? Vaping Use: Never used  ?Substance and Sexual Activity  ? Alcohol use: Not Currently  ? Drug use: Never  ? Sexual activity: Not Currently  ?Other Topics Concern  ? Not on file  ?Social History Narrative  ? Not on file  ? ?Social Determinants  of Health  ? ?Financial Resource Strain: Not on file  ?Food Insecurity: Not on file  ?Transportation Needs: Not on file  ?Physical Activity: Not on file  ?Stress: Not on file  ?Social Connections: Not on file  ? ?Additional Social History:  ?  ?  ?  ?  ?  ?  ?  ?  ?  ?  ?  ? ?Sleep: Good ? ?Appetite:  Good ? ?Current Medications: ?Current Facility-Administered Medications  ?Medication Dose Route Frequency Provider Last Rate Last Admin  ? alum & mag hydroxide-simeth (MAALOX/MYLANTA) 200-200-20 MG/5ML suspension 30 mL  30 mL Oral Q4H PRN Sarina Ill, DO      ? LORazepam (ATIVAN) tablet 1 mg  1 mg Oral TID PRN Sarina Ill, DO      ? Or  ? LORazepam (ATIVAN) injection 1 mg  1 mg Intramuscular TID PRN Sarina Ill, DO      ? magnesium hydroxide (MILK OF MAGNESIA) suspension 30 mL  30 mL Oral Daily PRN Sarina Ill, DO      ? OLANZapine (ZYPREXA) injection 10 mg  10 mg Intramuscular QHS Sarina Ill, DO   10 mg at 09/16/21 2200  ? Or  ? OLANZapine (ZYPREXA) tablet 10 mg  10 mg Oral QHS Sarina Ill, DO   10 mg at 09/18/21 2112  ? traZODone (DESYREL) tablet 100 mg  100 mg Oral  QHS PRN Sarina Ill, DO      ? ? ?Lab Results: No results found for this or any previous visit (from the past 48 hour(s)). ? ?Blood Alcohol level:  ?Lab Results  ?Component Value Date  ? ETH <10 08/14/2019  ? ? ?Metabolic Disorder Labs: ?Lab Results  ?Component Value Date  ? HGBA1C 5.7 (H) 09/16/2021  ? MPG 117 09/16/2021  ? ?No results found for: PROLACTIN ?Lab Results  ?Component Value Date  ? CHOL 137 09/15/2021  ? TRIG 49 09/15/2021  ? HDL 56 09/15/2021  ? CHOLHDL 2.4 09/15/2021  ? VLDL 10 09/15/2021  ? LDLCALC 71 09/15/2021  ? ? ?Physical Findings: ?AIMS:  , ,  ,  ,    ?CIWA:    ?COWS:    ? ?Musculoskeletal: ?Strength & Muscle Tone: within normal limits ?Gait & Station: normal ?Patient leans: N/A ? ?Psychiatric Specialty Exam: ? ?Presentation  ?General Appearance:  Appropriate for Environment ? ?Eye Contact:Good ? ?Speech:Clear and Coherent; Normal Rate ? ?Speech Volume:Normal ? ?Handedness:Right ? ? ?Mood and Affect  ?Mood:Euthymic ? ?Affect:Congruent ? ? ?Thought Process  ?Thought Processes:Coherent; Disorganized ? ?Descriptions of Associations:Loose ? ?Orientation:Partial (self and place) ? ?Thought Content:Delusions ? ?History of Schizophrenia/Schizoaffective disorder:No ? ?Duration of Psychotic Symptoms:N/A ? ?Hallucinations:No data recorded ?Ideas of Reference:Delusions ? ?Suicidal Thoughts:No data recorded ?Homicidal Thoughts:No data recorded ? ?Sensorium  ?Memory:Immediate Poor; Remote Poor; Recent Poor ? ?Judgment:Impaired ? ?Insight:Poor ? ? ?Executive Functions  ?Concentration:Fair ? ?Attention Span:Fair ? ?Recall:Poor ? ?Fund of Knowledge:Fair ? ?Language:Good ? ? ?Psychomotor Activity  ?Psychomotor Activity:No data recorded ? ?Assets  ?Assets:Communication Skills; Desire for Improvement; Housing; Social Support ? ? ?Sleep  ?Sleep:No data recorded ? ? ?Physical Exam: ?Physical Exam ?Vitals and nursing note reviewed.  ?Constitutional:   ?   Appearance: Normal appearance. She is normal weight.  ?Neurological:  ?   General: No focal deficit present.  ?   Mental Status: She is alert and oriented to person, place, and time.  ?Psychiatric:     ?   Attention and Perception: Attention normal. She perceives auditory hallucinations.     ?   Mood and Affect: Mood and affect normal.     ?   Speech: Speech normal.     ?   Behavior: Behavior normal. Behavior is cooperative.     ?   Thought Content: Thought content is delusional.     ?   Cognition and Memory: Memory is impaired.     ?   Judgment: Judgment is inappropriate.  ? ?Review of Systems  ?Constitutional: Negative.   ?HENT: Negative.    ?Eyes: Negative.   ?Respiratory: Negative.    ?Cardiovascular: Negative.   ?Gastrointestinal: Negative.   ?Genitourinary: Negative.   ?Musculoskeletal: Negative.   ?Skin: Negative.    ?Neurological: Negative.   ?Endo/Heme/Allergies: Negative.   ?Psychiatric/Behavioral:  Positive for hallucinations and memory loss.   ?Blood pressure (!) 145/81, pulse 78, temperature 97.6 ?F (36.4 ?C), temperature source Oral, resp. rate 18, height 5\' 2"  (1.575 m), weight 47.2 kg, SpO2 99 %. Body mass index is 19.02 kg/m?. ? ? ?Treatment Plan Summary: ?Daily contact with patient to assess and evaluate symptoms and progress in treatment, Medication management, and Plan continue current medications.  Social work is working on .  Consider long-acting injection. ? ?Stage manager, DO ?09/19/2021, 10:59 AM ? ?

## 2021-09-20 DIAGNOSIS — F312 Bipolar disorder, current episode manic severe with psychotic features: Secondary | ICD-10-CM | POA: Diagnosis not present

## 2021-09-20 MED ORDER — ACETAMINOPHEN 325 MG PO TABS
650.0000 mg | ORAL_TABLET | Freq: Four times a day (QID) | ORAL | Status: DC | PRN
  Administered 2021-09-27 – 2021-09-28 (×4): 650 mg via ORAL
  Filled 2021-09-20 (×4): qty 2

## 2021-09-20 NOTE — [facility] Group Notes (Signed)
Group: chair yoga  ?Pt was present,not able to participating in all movements due to pain in her upper shoulders (pre-existing, per pt) ,calm, composed ?

## 2021-09-20 NOTE — Progress Notes (Signed)
Patient is observed in the dayroom interacting appropriately on the milieu. She denies SI, HI, AVH, anxiety, and depression. She is medication compliant and voiced no complaints. She is safe on the unit at this time. Q 15 minute safety checks in place.  ?

## 2021-09-20 NOTE — Progress Notes (Signed)
[facility] MD Progress Note ? ?09/20/2021 10:50 AM ?Carolyn Huynh  ?MRN:  [id number] ?Subjective: [name] continues to have fixed delusions but she is pleasant and cooperative and compliant with her Zyprexa.  She does complain of a headache and the nurses asked if she does not Tylenol.  Other than that, she has been appropriate on the unit. ? ?Principal Problem: Schizophrenia, paranoid type (HCC) ?Diagnosis: Principal Problem: ?  Schizophrenia, paranoid type (HCC) ?Active Problems: ?  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  Vascular dementia with delusions (HCC) ? ?Total Time spent with patient: 15 minutes ? ?Past Psychiatric History: Schizophrenia with vascular dementia ? ?Past Medical History:  ?Past Medical History:  ?Diagnosis Date  ? Asthma   ? COPD (chronic obstructive pulmonary disease) (HCC)   ? Dementia (HCC)   ? Hypertension   ? Pneumothorax   ?  ?Past Surgical History:  ?Procedure Laterality Date  ? CHEST TUBE INSERTION    ? VIDEO ASSISTED THORACOSCOPY Right 02/01/2018  ? Procedure: VIDEO ASSISTED THORACOSCOPY, BLEB STAPLING;  Surgeon: Kerin Perna, MD;  Location: [facility] OR;  Service: Thoracic;  Laterality: Right;  ? ? ?Social History:  ?Social History  ? ?Substance and Sexual Activity  ?Alcohol Use Not Currently  ?   ?Social History  ? ?Substance and Sexual Activity  ?Drug Use Never  ?  ?Social History  ? ?Socioeconomic History  ? Marital status: Significant Other  ?  Spouse name: Not on file  ? Number of children: Not on file  ? Years of education: Not on file  ? Highest education level: Not on file  ?Occupational History  ? Not on file  ?Tobacco Use  ? Smoking status: Never  ? Smokeless tobacco: Never  ?Vaping Use  ? Vaping Use: Never used  ?Substance and Sexual Activity  ? Alcohol use: Not Currently  ? Drug use: Never  ? Sexual activity: Not Currently  ?Other Topics Concern  ? Not on file  ?Social History Narrative  ? Not on file  ? ?Social Determinants of Health  ? ?Financial Resource  Strain: Not on file  ?Food Insecurity: Not on file  ?Transportation Needs: Not on file  ?Physical Activity: Not on file  ?Stress: Not on file  ?Social Connections: Not on file  ? ?Additional Social History:  ?  ?  ?  ?  ?  ?  ?  ?  ?  ?  ?  ? ?Sleep: Good ? ?Appetite:  Good ? ?Current Medications: ?Current Facility-Administered Medications  ?Medication Dose Route Frequency Provider Last Rate Last Admin  ? acetaminophen (TYLENOL) tablet 650 mg  650 mg Oral Q6H PRN Sarina Ill, DO      ? alum & mag hydroxide-simeth (MAALOX/MYLANTA) 200-200-20 MG/5ML suspension 30 mL  30 mL Oral Q4H PRN Sarina Ill, DO      ? LORazepam (ATIVAN) tablet 1 mg  1 mg Oral TID PRN Sarina Ill, DO      ? Or  ? LORazepam (ATIVAN) injection 1 mg  1 mg Intramuscular TID PRN Sarina Ill, DO      ? magnesium hydroxide (MILK OF MAGNESIA) suspension 30 mL  30 mL Oral Daily PRN Sarina Ill, DO      ? OLANZapine (ZYPREXA) injection 10 mg  10 mg Intramuscular QHS Sarina Ill, DO   10 mg at 09/16/21 2200  ? Or  ? OLANZapine (ZYPREXA) tablet 10 mg  10 mg Oral QHS Elane Fritz  Edward, DO   10 mg at 09/19/21 2114  ? traZODone (DESYREL) tablet 100 mg  100 mg Oral QHS PRN Sarina Ill, DO      ? ? ?Lab Results: No results found for this or any previous visit (from the past 48 hour(s)). ? ?Blood Alcohol level:  ?Lab Results  ?Component Value Date  ? ETH <10 08/14/2019  ? ? ?Metabolic Disorder Labs: ?Lab Results  ?Component Value Date  ? HGBA1C 5.7 (H) 09/16/2021  ? MPG 117 09/16/2021  ? ?No results found for: PROLACTIN ?Lab Results  ?Component Value Date  ? CHOL 137 09/15/2021  ? TRIG 49 09/15/2021  ? HDL 56 09/15/2021  ? CHOLHDL 2.4 09/15/2021  ? VLDL 10 09/15/2021  ? LDLCALC 71 09/15/2021  ? ? ?Physical Findings: ?AIMS:  , ,  ,  ,    ?CIWA:    ?COWS:    ? ?Musculoskeletal: ?Strength & Muscle Tone: within normal limits ?Gait & Station: normal ?Patient leans:  N/A ? ?Psychiatric Specialty Exam: ? ?Presentation  ?General Appearance: Appropriate for Environment ? ?Eye Contact:Good ? ?Speech:Clear and Coherent; Normal Rate ? ?Speech Volume:Normal ? ?Handedness:Right ? ? ?Mood and Affect  ?Mood:Euthymic ? ?Affect:Congruent ? ? ?Thought Process  ?Thought Processes:Coherent; Disorganized ? ?Descriptions of Associations:Loose ? ?Orientation:Partial (self and place) ? ?Thought Content:Delusions ? ?History of Schizophrenia/Schizoaffective disorder:No ? ?Duration of Psychotic Symptoms:N/A ? ?Hallucinations:No data recorded ?Ideas of Reference:Delusions ? ?Suicidal Thoughts:No data recorded ?Homicidal Thoughts:No data recorded ? ?Sensorium  ?Memory:Immediate Poor; Remote Poor; Recent Poor ? ?Judgment:Impaired ? ?Insight:Poor ? ? ?Executive Functions  ?Concentration:Fair ? ?Attention Span:Fair ? ?Recall:Poor ? ?Fund of Knowledge:Fair ? ?Language:Good ? ? ?Psychomotor Activity  ?Psychomotor Activity:No data recorded ? ?Assets  ?Assets:Communication Skills; Desire for Improvement; Housing; Social Support ? ? ?Sleep  ?Sleep:No data recorded ? ? ?Physical Exam: ?Physical Exam ?Vitals and nursing note reviewed.  ?Constitutional:   ?   Appearance: Normal appearance. She is normal weight.  ?Neurological:  ?   General: No focal deficit present.  ?   Mental Status: She is alert and oriented to person, place, and time.  ?Psychiatric:     ?   Mood and Affect: Mood normal.     ?   Behavior: Behavior normal.  ? ?Review of Systems  ?Constitutional: Negative.   ?HENT: Negative.    ?Eyes: Negative.   ?Respiratory: Negative.    ?Cardiovascular: Negative.   ?Gastrointestinal: Negative.   ?Genitourinary: Negative.   ?Musculoskeletal: Negative.   ?Skin: Negative.   ?Neurological: Negative.   ?Endo/Heme/Allergies: Negative.   ?Psychiatric/Behavioral: Negative.    ?Blood pressure (!) 152/86, pulse 80, temperature (!) 97.4 ?F (36.3 ?C), temperature source Oral, resp. rate 20, height 5\' 2"  (1.575 m), weight  47.2 kg, SpO2 99 %. Body mass index is 19.02 kg/m?. ? ? ?Treatment Plan Summary: ?Daily contact with patient to assess and evaluate symptoms and progress in treatment, Medication management, and Plan continue current medications. ? ? , DO ?09/20/2021, 10:50 AM ? ?
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
## 2021-09-20 NOTE — BH IP Treatment Plan (Signed)
Interdisciplinary Treatment and Diagnostic Plan Update ? ?09/20/2021 ?Time of Session: 9:30AM ?Carolyn Huynh ?MRN: [id number] ? ?Principal Diagnosis: Schizophrenia, paranoid type ([name]) ? ?Secondary Diagnoses: Principal Problem: ?  Schizophrenia, paranoid type (Carolyn Huynh) ?Active Problems: ?  Severe manic bipolar 1 disorder with psychotic behavior (Carolyn Huynh) ?  Vascular dementia with delusions (Carolyn Huynh) ? ? ?Current Medications:  ?Current Facility-Administered Medications  ?Medication Dose Route Frequency Provider Last Rate Last Admin  ? alum & mag hydroxide-simeth (MAALOX/MYLANTA) 200-200-20 MG/5ML suspension 30 mL  30 mL Oral Q4H PRN Parks Ranger, DO      ? LORazepam (ATIVAN) tablet 1 mg  1 mg Oral TID PRN Parks Ranger, DO      ? Or  ? LORazepam (ATIVAN) injection 1 mg  1 mg Intramuscular TID PRN Parks Ranger, DO      ? magnesium hydroxide (MILK OF MAGNESIA) suspension 30 mL  30 mL Oral Daily PRN Parks Ranger, DO      ? OLANZapine (ZYPREXA) injection 10 mg  10 mg Intramuscular QHS Parks Ranger, DO   10 mg at 09/16/21 2200  ? Or  ? OLANZapine (ZYPREXA) tablet 10 mg  10 mg Oral QHS Parks Ranger, DO   10 mg at 09/19/21 2114  ? traZODone (DESYREL) tablet 100 mg  100 mg Oral QHS PRN Parks Ranger, DO      ? ?PTA Medications: ?Medications Prior to Admission  ?Medication Sig Dispense Refill Last Dose  ? acetaminophen (TYLENOL) 325 MG tablet Take 2 tablets (650 mg total) by mouth every 6 (six) hours as needed for mild pain (or Fever >/= 101). (Patient not taking: Reported on 09/05/2021)     ? hydrochlorothiazide (MICROZIDE) 12.5 MG capsule Take 1 capsule (12.5 mg total) by mouth daily. (Patient not taking: Reported on 09/05/2021) 30 capsule 3   ? ondansetron (ZOFRAN) 4 MG tablet Take 1 tablet (4 mg total) by mouth every 8 (eight) hours as needed for up to 10 doses for nausea or vomiting. (Patient not taking: Reported on 09/05/2021) 10 tablet 0   ? potassium chloride  (K-DUR,KLOR-CON) 10 MEQ tablet Take 1 tablet (10 mEq total) by mouth daily. (Patient not taking: Reported on 09/05/2021) 30 tablet 3   ? ? ?Patient Stressors: Other: Does not know why she is here.   ? ?Patient Strengths: Supportive family/friends  ? ?Treatment Modalities: Medication Management, Group therapy, Case management,  ?1 to 1 session with clinician, Psychoeducation, Recreational therapy. ? ? ?Physician Treatment Plan for Primary Diagnosis: Schizophrenia, paranoid type (Carolyn Huynh) ?Long Term Goal(s): Improvement in symptoms so as ready for discharge  ? ?Short Term Goals: Compliance with prescribed medications will improve ? ?Medication Management: Evaluate patient's response, side effects, and tolerance of medication regimen. ? ?Therapeutic Interventions: 1 to 1 sessions, Unit Group sessions and Medication administration. ? ?Evaluation of Outcomes: Not Met ? ?Physician Treatment Plan for Secondary Diagnosis: Principal Problem: ?  Schizophrenia, paranoid type (Carolyn Huynh) ?Active Problems: ?  Severe manic bipolar 1 disorder with psychotic behavior (Carolyn Huynh) ?  Vascular dementia with delusions (Carolyn Huynh) ? ?Long Term Goal(s): Improvement in symptoms so as ready for discharge  ? ?Short Term Goals: Compliance with prescribed medications will improve    ? ?Medication Management: Evaluate patient's response, side effects, and tolerance of medication regimen. ? ?Therapeutic Interventions: 1 to 1 sessions, Unit Group sessions and Medication administration. ? ?Evaluation of Outcomes: Not Met ? ? ?RN Treatment Plan for Primary Diagnosis: Schizophrenia, paranoid type (Carolyn Huynh) ?Long Term Goal(s): Knowledge of  disease and therapeutic regimen to maintain health will improve ? ?Short Term Goals: Ability to remain free from injury will improve, Ability to verbalize frustration and anger appropriately will improve, Ability to demonstrate self-control, Ability to participate in decision making will improve, Ability to verbalize feelings will improve,  Ability to disclose and discuss suicidal ideas, Ability to identify and develop effective coping behaviors will improve, and Compliance with prescribed medications will improve ? ?Medication Management: RN will administer medications as ordered by provider, will assess and evaluate patient's response and provide education to patient for prescribed medication. RN will report any adverse and/or side effects to prescribing provider. ? ?Therapeutic Interventions: 1 on 1 counseling sessions, Psychoeducation, Medication administration, Evaluate responses to treatment, Monitor vital signs and CBGs as ordered, Perform/monitor CIWA, COWS, AIMS and Fall Risk screenings as ordered, Perform wound care treatments as ordered. ? ?Evaluation of Outcomes: Progressing ? ? ?LCSW Treatment Plan for Primary Diagnosis: Schizophrenia, paranoid type (Carolyn Huynh) ?Long Term Goal(s): Safe transition to appropriate next level of care at discharge, Engage patient in therapeutic group addressing interpersonal concerns. ? ?Short Term Goals: Engage patient in aftercare planning with referrals and resources, Increase social support, Increase ability to appropriately verbalize feelings, Increase emotional regulation, Facilitate acceptance of mental health diagnosis and concerns, Facilitate patient progression through stages of change regarding substance use diagnoses and concerns, and Identify triggers associated with mental health/substance abuse issues ? ?Therapeutic Interventions: Assess for all discharge needs, 1 to 1 time with Education officer, museum, Explore available resources and support systems, Assess for adequacy in community support network, Educate family and significant other(s) on suicide prevention, Complete Psychosocial Assessment, Interpersonal group therapy. ? ?Evaluation of Outcomes: Not Met ? ? ?Progress in Treatment: ?Attending groups: No. ?Participating in groups: No. ?Taking medication as prescribed: Yes.  ?Toleration medication:  Yes. ?Family/Significant other contact made: Yes, individual(s) contacted:  SPE completed with patient's daughter, Carolyn Stelzer ?Patient understands diagnosis: No. ?Discussing patient identified problems/goals with staff: Yes. ?Medical problems stabilized or resolved: Yes. ?Denies suicidal/homicidal ideation: Yes. ?Issues/concerns per patient self-inventory: No. ?Other: none. ? ?New problem(s) identified: Yes, Describe:  Patient is currently on forced medication order due to medication refusal. ? ?New Short Term/Long Term Goal(s): Patient to work towards elimination of symptoms of psychosis, medication management for mood stabilization; development of comprehensive mental wellness plan. Update 09/20/2021: No changes at this time.  ? ?Patient Goals:  "wanna go home" Update 09/20/2021: No changes at this time.  ? ?Discharge Plan or Barriers: CSW will assist pt with development of appropriate discharge/aftercare plan. Update 09/20/2021: No changes at this time.  ? ?Reason for Continuation of Hospitalization: Delusions  ?Hallucinations ?Medication stabilization ? ?Estimated Length of Stay: TBD ? ? ?Scribe for Treatment Team: ?Sherilyn Dacosta ?09/20/2021 ?9:38 AM ?

## 2021-09-20 NOTE — Progress Notes (Signed)
Patient is minimal in interaction and isolative to her room. At the beginning of the shift she is observed in her bedroom. Pt denies pain, SI, HI, AVH, anxiety, and depression. She is medication compliant, calm, and cooperative. Pt is safe on the unit at this time. Q 15 minute safety checks in place.  ?

## 2021-09-20 NOTE — Group Note (Signed)
LCSW Group Therapy Note ? ?Group Date: 09/20/2021 ?Start Time: 1300 ?End Time: 1400 ? ? ?Type of Therapy and Topic:  Group Therapy - Healthy vs Unhealthy Coping Skills ? ?Participation Level:  Did Not Attend  ? ?Description of Group ?The focus of this group was to determine what unhealthy coping techniques typically are used by group members and what healthy coping techniques would be helpful in coping with various problems. Patients were guided in becoming aware of the differences between healthy and unhealthy coping techniques. Patients were asked to identify 2-3 healthy coping skills they would like to learn to use more effectively. ? ?Therapeutic Goals ?Patients learned that coping is what human beings do all day long to deal with various situations in their lives ?Patients defined and discussed healthy vs unhealthy coping techniques ?Patients identified their preferred coping techniques and identified whether these were healthy or unhealthy ?Patients determined 2-3 healthy coping skills they would like to become more familiar with and use more often. ?Patients provided support and ideas to each other ? ? ?Summary of Patient Progress: Due to limited staffing, group was not held on the unit.  ? ? ?Therapeutic Modalities ?Cognitive Behavioral Therapy ?Motivational Interviewing ? ?Adiva Boettner K Tanveer Dobberstein, LCSWA ?09/20/2021  4:00 PM   ?

## 2021-09-21 DIAGNOSIS — F312 Bipolar disorder, current episode manic severe with psychotic features: Secondary | ICD-10-CM | POA: Diagnosis not present

## 2021-09-21 NOTE — Progress Notes (Signed)
[facility] MD Progress Note ? ?09/21/2021 11:04 AM ?Carolyn Huynh  ?MRN:  [id number] ?Subjective: Carolyn Huynh took her Zyprexa last night by mouth.  I discussed with her her blood pressure and how it has been high.  I asked if she would be okay with taking medication and she said no.  I explained the risks of having hypertension including kidney failure.  She says it might need that later I will let you know.  She has very poor insight and continues to be delusional but her behavior has been good. ? ?Principal Problem: Schizophrenia, paranoid type ([place]) ?Diagnosis: Principal Problem: ?  Schizophrenia, paranoid type ([place]) ?Active Problems: ?  Severe manic bipolar 1 disorder with psychotic behavior ([place]) ?  Vascular dementia with delusions ([place]) ? ?Total Time spent with patient: 15 minutes ? ?Past Psychiatric History: Extensive ? ?Past Medical History:  ?Past Medical History:  ?Diagnosis Date  ? Asthma   ? COPD (chronic obstructive pulmonary disease) ([place])   ? Dementia ([place])   ? Hypertension   ? Pneumothorax   ?  ?Past Surgical History:  ?Procedure Laterality Date  ? CHEST TUBE INSERTION    ? VIDEO ASSISTED THORACOSCOPY Right 02/01/2018  ? Procedure: VIDEO ASSISTED THORACOSCOPY, BLEB STAPLING;  Surgeon: Ivin Poot, MD;  Location: [place];  Service: Thoracic;  Laterality: Right;  ? ?Family History: History reviewed. No pertinent family history. ? ?Social History:  ?Social History  ? ?Substance and Sexual Activity  ?Alcohol Use Not Currently  ?   ?Social History  ? ?Substance and Sexual Activity  ?Drug Use Never  ?  ?Social History  ? ?Socioeconomic History  ? Marital status: Significant Other  ?  Spouse name: Not on file  ? Number of children: Not on file  ? Years of education: Not on file  ? Highest education level: Not on file  ?Occupational History  ? Not on file  ?Tobacco Use  ? Smoking status: Never  ? Smokeless tobacco: Never  ?Vaping Use  ? Vaping Use: Never used  ?Substance and Sexual Activity  ? Alcohol use: Not  Currently  ? Drug use: Never  ? Sexual activity: Not Currently  ?Other Topics Concern  ? Not on file  ?Social History Narrative  ? Not on file  ? ?Social Determinants of Health  ? ?Financial Resource Strain: Not on file  ?Food Insecurity: Not on file  ?Transportation Needs: Not on file  ?Physical Activity: Not on file  ?Stress: Not on file  ?Social Connections: Not on file  ? ?Additional Social History:  ?  ?  ?  ?  ?  ?  ?  ?  ?  ?  ?  ? ?Sleep: Good ? ?Appetite:  Good ? ?Current Medications: ?Current Facility-Administered Medications  ?Medication Dose Route Frequency Provider Last Rate Last Admin  ? acetaminophen (TYLENOL) tablet 650 mg  650 mg Oral Q6H PRN Parks Ranger, DO      ? alum & mag hydroxide-simeth (MAALOX/MYLANTA) 200-200-20 MG/5ML suspension 30 mL  30 mL Oral Q4H PRN Parks Ranger, DO      ? LORazepam (ATIVAN) tablet 1 mg  1 mg Oral TID PRN Parks Ranger, DO      ? Or  ? LORazepam (ATIVAN) injection 1 mg  1 mg Intramuscular TID PRN Parks Ranger, DO      ? magnesium hydroxide (MILK OF MAGNESIA) suspension 30 mL  30 mL Oral Daily PRN Parks Ranger, DO      ?  OLANZapine (ZYPREXA) injection 10 mg  10 mg Intramuscular QHS Parks Ranger, DO   10 mg at 09/16/21 2200  ? Or  ? OLANZapine (ZYPREXA) tablet 10 mg  10 mg Oral QHS Parks Ranger, DO   10 mg at 09/20/21 2107  ? traZODone (DESYREL) tablet 100 mg  100 mg Oral QHS PRN Parks Ranger, DO      ? ? ?Lab Results: No results found for this or any previous visit (from the past 48 hour(s)). ? ?Blood Alcohol level:  ?Lab Results  ?Component Value Date  ? ETH <10 08/14/2019  ? ? ?Metabolic Disorder Labs: ?Lab Results  ?Component Value Date  ? HGBA1C 5.7 (H) 09/16/2021  ? MPG 117 09/16/2021  ? ?No results found for: PROLACTIN ?Lab Results  ?Component Value Date  ? CHOL 137 09/15/2021  ? TRIG 49 09/15/2021  ? HDL 56 09/15/2021  ? CHOLHDL 2.4 09/15/2021  ? VLDL 10 09/15/2021  ? [place]  71 09/15/2021  ? ? ?Physical Findings: ?AIMS:  , ,  ,  ,    ?CIWA:    ?COWS:    ? ?Musculoskeletal: ?Strength & Muscle Tone: within normal limits ?Gait & Station: normal ?Patient leans: N/A ? ?Psychiatric Specialty Exam: ? ?Presentation  ?General Appearance: Appropriate for Environment ? ?Eye Contact:Good ? ?Speech:Clear and Coherent; Normal Rate ? ?Speech Volume:Normal ? ?Handedness:Right ? ? ?Mood and Affect  ?Mood:Euthymic ? ?Affect:Congruent ? ? ?Thought Process  ?Thought Processes:Coherent; Disorganized ? ?Descriptions of Associations:Loose ? ?Orientation:Partial (self and place) ? ?Thought Content:Delusions ? ?History of Schizophrenia/Schizoaffective disorder:No ? ?Duration of Psychotic Symptoms:N/A ? ?Hallucinations:No data recorded ?Ideas of Reference:Delusions ? ?Suicidal Thoughts:No data recorded ?Homicidal Thoughts:No data recorded ? ?Sensorium  ?Memory:Immediate Poor; Remote Poor; Recent Poor ? ?Judgment:Impaired ? ?Insight:Poor ? ? ?Executive Functions  ?Concentration:Fair ? ?Attention Span:Fair ? ?Recall:Poor ? ?[name] ? ?Language:Good ? ? ?Psychomotor Activity  ?Psychomotor Activity:No data recorded ? ?Assets  ?Assets:Communication Skills; Desire for Improvement; Housing; Social Support ? ? ?Sleep  ?Sleep:No data recorded ? ? ?Physical Exam: ?Physical Exam ?Vitals and nursing note reviewed.  ?Constitutional:   ?   Appearance: Normal appearance. She is normal weight.  ?Neurological:  ?   General: No focal deficit present.  ?   Mental Status: She is alert and oriented to person, place, and time.  ?Psychiatric:     ?   Mood and Affect: Mood normal.     ?   Behavior: Behavior normal.  ? ?Review of Systems  ?Constitutional: Negative.   ?HENT: Negative.    ?Eyes: Negative.   ?Respiratory: Negative.    ?Cardiovascular: Negative.   ?Gastrointestinal: Negative.   ?Genitourinary: Negative.   ?Musculoskeletal: Negative.   ?Skin: Negative.   ?Neurological: Negative.   ?Endo/Heme/Allergies:  Negative.   ?Psychiatric/Behavioral: Negative.    ?Blood pressure (!) 156/79, pulse 83, temperature (!) 97.5 ?F (36.4 ?C), temperature source Oral, resp. rate 20, height 5\' 2"  (1.575 m), weight 47.2 kg, SpO2 95 %. Body mass index is 19.02 kg/m?. ? ? ?Treatment Plan Summary: ?Daily contact with patient to assess and evaluate symptoms and progress in treatment, Medication management, and Plan continue current medications. ? ?Parks Ranger, DO ?09/21/2021, 11:04 AM ? ?

## 2021-09-21 NOTE — [facility] Group Notes (Signed)
Midday outdoor  Group: outdoor patio , corn hole, music, socializing. Pt  participated ?

## 2021-09-21 NOTE — Progress Notes (Addendum)
Patient presents alert and oriented to self only.  Patient affect is calm and behavior withdrawn and isolative. Denies AVH, however, patient was observed patient responding to internal stimuli as she was talking to herself in day room.  Patient denies SI, HI, depression, anxiety or pain.  VSS.  Compliant with all evening meds.  Ongoing Q15 minute safety check rounds per unit protocol. ?

## 2021-09-21 NOTE — Group Note (Addendum)
[facility] LCSW Group Therapy Note ? ? ?Group Date: 09/21/2021 ?Start Time: 1310 ?End Time: 1410 ? ? ?Type of Therapy/Topic:  Group Therapy:  Emotion Regulation ? ?Participation Level:  Minimal  ? ? ?Description of Group:   ? The purpose of this group is to assist patients in learning to regulate negative emotions and experience positive emotions. Patients will be guided to discuss ways in which they have been vulnerable to their negative emotions. These vulnerabilities will be juxtaposed with experiences of positive emotions or situations, and patients challenged to use positive emotions to combat negative ones. Special emphasis will be placed on coping with negative emotions in conflict situations, and patients will process healthy conflict resolution skills. ? ?Therapeutic Goals: ?Patient will identify two positive emotions or experiences to reflect on in order to balance out negative emotions:  ?Patient will label two or more emotions that they find the most difficult to experience:  ?Patient will be able to demonstrate positive conflict resolution skills through discussion or role plays:  ? ?Summary of Patient Progress: Patient was present for the entirety of the group session. Patient participated in the icebreaker activity but declined to participate in discussion of the topic when invited. Patient was observed responding to internal stimuli throughout group, marked by patient talking to herself while covering her face with her hand.  ? ? ? ?Therapeutic Modalities:   ?Cognitive Behavioral Therapy ?Feelings Identification ?Dialectical Behavioral Therapy ? ? ?Ileana Ladd Chandy Tarman, LCSWA ?

## 2021-09-21 NOTE — Progress Notes (Signed)
Patient is calm and cooperative with assessment. She denies SI, HI, AVH, and pain. However, patient still observed to be having auditory/visual hallucinations. Patient is active on the unit and has been in the dayroom since breakfast. She participated in group yoga. Patient remains safe on the unit at this time. ?

## 2021-09-21 NOTE — [facility] Group Notes (Signed)
Morning group-= chair yoga pt participated  ?

## 2021-09-22 DIAGNOSIS — F312 Bipolar disorder, current episode manic severe with psychotic features: Secondary | ICD-10-CM | POA: Diagnosis not present

## 2021-09-22 NOTE — Progress Notes (Signed)
Patient presents alert and oriented to self only.  Patient affect is calm and behavior withdrawn and isolative. When asked if she's hearing voices patient states, "Oh no, just the ones that are constantly talking to me in my head."  Observed patient responding to internal stimuli and talking to herself in day room.  Patient denies SI, HI, VH, depression, anxiety or pain.  VSS.  Compliant with all evening meds.  Ongoing Q15 minute safety check rounds per unit protocol. ?

## 2021-09-22 NOTE — Group Note (Signed)
[facility] LCSW Group Therapy Note ? ? ? ?Group Date: 09/22/2021 ?Start Time: 1400 ?End Time: 1405 ? ?Type of Therapy and Topic:  Group Therapy:  Overcoming Obstacles ? ?Participation Level:  [facility] PARTICIPATION LEVEL: Did Not Attend ? ?Mood: ? ?Description of Group:   ?In this group patients will be encouraged to explore what they see as obstacles to their own wellness and recovery. They will be guided to discuss their thoughts, feelings, and behaviors related to these obstacles. The group will process together ways to cope with barriers, with attention given to specific choices patients can make. Each patient will be challenged to identify changes they are motivated to make in order to overcome their obstacles. This group will be process-oriented, with patients participating in exploration of their own experiences as well as giving and receiving support and challenge from other group members. ? ?Therapeutic Goals: ?1. Patient will identify personal and current obstacles as they relate to admission. ?2. Patient will identify barriers that currently interfere with their wellness or overcoming obstacles.  ?3. Patient will identify feelings, thought process and behaviors related to these barriers. ?4. Patient will identify two changes they are willing to make to overcome these obstacles:  ? ? ?Summary of Patient Progress ? ? ?X ? ? ?Therapeutic Modalities:   ?Cognitive Behavioral Therapy ?Solution Focused Therapy ?Motivational Interviewing ?Relapse Prevention Therapy ? ? ?Ezra Denne A Nicholai Willette, LCSWA ?

## 2021-09-22 NOTE — Plan of Care (Signed)
°  Problem: Group Participation °Goal: STG - Patient will engage in groups without prompting or encouragement from LRT x3 group sessions within 5 recreation therapy group sessions °Description: STG - Patient will engage in groups without prompting or encouragement from LRT x3 group sessions within 5 recreation therapy group sessions °Outcome: Progressing °  °

## 2021-09-22 NOTE — [facility] Group Notes (Signed)
Group: chair yoga, Pt present ,calm. Composed - involved  ? ?

## 2021-09-22 NOTE — Progress Notes (Signed)
Patient denies SI, HI, and AVH. Patient is observed to be talking to herself. She denies pain and other physical problems. Patient is present in the milieu and ate breakfast and participated in group yoga. She remains safe on the unit at this time. ?

## 2021-09-22 NOTE — Progress Notes (Signed)
[facility] MD Progress Note ? ?09/22/2021 1:47 PM ?Carolyn Huynh  ?MRN:  [id number] ?Subjective: Malyssa continues to be delusional and oppositional at times.  Her behavior has improved.  She is taking Zyprexa by mouth after receiving injectables.  I discussed starting blood pressure medication and she said no.  No side effects from her medicine. ? ?Principal Problem: Schizophrenia, paranoid type ([facility]) ?Diagnosis: Principal Problem: ?  Schizophrenia, paranoid type ([facility]) ?Active Problems: ?  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  Vascular dementia with delusions ([facility]) ? ?Total Time spent with patient: 15 minutes ? ?Past Psychiatric History: History of schizophrenia and dementia and been seen at [facility] in [place]. ? ?Past Medical History:  ?Past Medical History:  ?Diagnosis Date  ? Asthma   ? COPD (chronic obstructive pulmonary disease) ([facility])   ? Dementia ([facility])   ? Hypertension   ? Pneumothorax   ?  ?Past Surgical History:  ?Procedure Laterality Date  ? CHEST TUBE INSERTION    ? VIDEO ASSISTED THORACOSCOPY Right 02/01/2018  ? Procedure: VIDEO ASSISTED THORACOSCOPY, BLEB STAPLING;  Surgeon: Kerin Perna, MD;  Location: [facility] OR;  Service: Thoracic;  Laterality: Right;  ? ?Family History: History reviewed. No pertinent family history. ? ?Social History:  ?Social History  ? ?Substance and Sexual Activity  ?Alcohol Use Not Currently  ?   ?Social History  ? ?Substance and Sexual Activity  ?Drug Use Never  ?  ?Social History  ? ?Socioeconomic History  ? Marital status: Significant Other  ?  Spouse name: Not on file  ? Number of children: Not on file  ? Years of education: Not on file  ? Highest education level: Not on file  ?Occupational History  ? Not on file  ?Tobacco Use  ? Smoking status: Never  ? Smokeless tobacco: Never  ?Vaping Use  ? Vaping Use: Never used  ?Substance and Sexual Activity  ? Alcohol use: Not Currently  ? Drug use: Never  ? Sexual activity:  Not Currently  ?Other Topics Concern  ? Not on file  ?Social History Narrative  ? Not on file  ? ?Social Determinants of Health  ? ?Financial Resource Strain: Not on file  ?Food Insecurity: Not on file  ?Transportation Needs: Not on file  ?Physical Activity: Not on file  ?Stress: Not on file  ?Social Connections: Not on file  ? ?Additional Social History:  ?  ?  ?  ?  ?  ?  ?  ?  ?  ?  ?  ? ?Sleep: Good ? ?Appetite:  Good ? ?Current Medications: ?Current Facility-Administered Medications  ?Medication Dose Route Frequency Provider Last Rate Last Admin  ? acetaminophen (TYLENOL) tablet 650 mg  650 mg Oral Q6H PRN Sarina Ill, DO      ? alum & mag hydroxide-simeth (MAALOX/MYLANTA) 200-200-20 MG/5ML suspension 30 mL  30 mL Oral Q4H PRN Sarina Ill, DO      ? LORazepam (ATIVAN) tablet 1 mg  1 mg Oral TID PRN Sarina Ill, DO      ? Or  ? LORazepam (ATIVAN) injection 1 mg  1 mg Intramuscular TID PRN Sarina Ill, DO      ? magnesium hydroxide (MILK OF MAGNESIA) suspension 30 mL  30 mL Oral Daily PRN Sarina Ill, DO      ? OLANZapine (ZYPREXA) injection 10 mg  10 mg Intramuscular QHS Sarina Ill, DO   10 mg  at 09/16/21 2200  ? Or  ? OLANZapine (ZYPREXA) tablet 10 mg  10 mg Oral QHS Sarina IllHerrick, Catherene Kaleta Edward, DO   10 mg at 09/21/21 2130  ? traZODone (DESYREL) tablet 100 mg  100 mg Oral QHS PRN Sarina IllHerrick, Karrisa Didio Edward, DO      ? ? ?Lab Results: No results found for this or any previous visit (from the past 48 hour(s)). ? ?Blood Alcohol level:  ?Lab Results  ?Component Value Date  ? ETH <10 08/14/2019  ? ? ?Metabolic Disorder Labs: ?Lab Results  ?Component Value Date  ? HGBA1C 5.7 (H) 09/16/2021  ? MPG 117 09/16/2021  ? ?No results found for: PROLACTIN ?Lab Results  ?Component Value Date  ? CHOL 137 09/15/2021  ? TRIG 49 09/15/2021  ? HDL 56 09/15/2021  ? CHOLHDL 2.4 09/15/2021  ? VLDL 10 09/15/2021  ? LDLCALC 71 09/15/2021  ? ? ?Physical Findings: ?AIMS:  ,  ,  ,  ,    ?CIWA:    ?COWS:    ? ?Musculoskeletal: ?Strength & Muscle Tone: within normal limits ?Gait & Station: normal ?Patient leans: N/A ? ?Psychiatric Specialty Exam: ? ?Presentation  ?General Appearance: Appropriate for Environment ? ?Eye Contact:Good ? ?Speech:Clear and Coherent; Normal Rate ? ?Speech Volume:Normal ? ?Handedness:Right ? ? ?Mood and Affect  ?Mood:Euthymic ? ?Affect:Congruent ? ? ?Thought Process  ?Thought Processes:Coherent; Disorganized ? ?Descriptions of Associations:Loose ? ?Orientation:Partial (self and place) ? ?Thought Content:Delusions ? ?History of Schizophrenia/Schizoaffective disorder:No ? ?Duration of Psychotic Symptoms:N/A ? ?Hallucinations:No data recorded ?Ideas of Reference:Delusions ? ?Suicidal Thoughts:No data recorded ?Homicidal Thoughts:No data recorded ? ?Sensorium  ?Memory:Immediate Poor; Remote Poor; Recent Poor ? ?Judgment:Impaired ? ?Insight:Poor ? ? ?Executive Functions  ?Concentration:Fair ? ?Attention Span:Fair ? ?Recall:Poor ? ?Fund of Knowledge:Fair ? ?Language:Good ? ? ?Psychomotor Activity  ?Psychomotor Activity:No data recorded ? ?Assets  ?Assets:Communication Skills; Desire for Improvement; Housing; Social Support ? ? ?Sleep  ?Sleep:No data recorded ? ? ?Physical Exam: ?Physical Exam ?Vitals and nursing note reviewed.  ?Constitutional:   ?   Appearance: Normal appearance. She is normal weight.  ?Neurological:  ?   General: No focal deficit present.  ?   Mental Status: She is alert and oriented to person, place, and time.  ?Psychiatric:     ?   Attention and Perception: Attention normal. She perceives auditory hallucinations.     ?   Mood and Affect: Mood and affect normal.     ?   Speech: Speech normal.     ?   Behavior: Behavior normal. Behavior is cooperative.     ?   Thought Content: Thought content is paranoid and delusional.     ?   Cognition and Memory: Cognition is impaired. Memory is impaired.     ?   Judgment: Judgment is impulsive and inappropriate.   ? ?Review of Systems  ?Constitutional: Negative.   ?HENT: Negative.    ?Eyes: Negative.   ?Respiratory: Negative.    ?Cardiovascular: Negative.   ?Gastrointestinal: Negative.   ?Genitourinary: Negative.   ?Musculoskeletal: Negative.   ?Skin: Negative.   ?Neurological: Negative.   ?Endo/Heme/Allergies: Negative.   ?Psychiatric/Behavioral: Negative.    ?Blood pressure (!) 154/82, pulse 77, temperature (!) 97.4 ?F (36.3 ?C), temperature source Oral, resp. rate 20, height 5\' 2"  (1.575 m), weight 47.2 kg, SpO2 96 %. Body mass index is 19.02 kg/m?. ? ? ?Treatment Plan Summary: ?Daily contact with patient to assess and evaluate symptoms and progress in treatment, Medication management, and Plan continue current medication.  Discussed discharge by Friday with social work. ? ?Sarina Ill, DO ?09/22/2021, 1:47 PM ? ?

## 2021-09-22 NOTE — Progress Notes (Signed)
Recreation Therapy Notes ? ?Date: 09/22/2021 ?  ?Time: 2:00 pm  ?  ?Location:  Court yard ?  ?Behavioral response: N/A ?  ?Intervention Topic: Strengths    ?  ?Discussion/Intervention: ?Patient refused to attend group. ?  ?Clinical Observations/Feedback:  ?Patient refused to attend group. ?  ?Carolyn Huynh LRT/CTRS ? ? ? ? ? ? ? ?Carolyn Huynh ?09/22/2021 2:18 PM ?

## 2021-09-23 DIAGNOSIS — F312 Bipolar disorder, current episode manic severe with psychotic features: Secondary | ICD-10-CM | POA: Diagnosis not present

## 2021-09-23 NOTE — [facility] Group Notes (Signed)
[facility] Group Notes:  (Nursing/MHT/Case Management/Adjunct) ? ?Date:  09/23/2021  ?Time:  9:30 AM ? ?Type of Therapy:  Psychoeducational Skills ? ?Participation Level:  Minimal ? ?Participation Quality:  Appropriate ? ?Affect:  Appropriate ? ?Cognitive:  Appropriate ? ?Insight:  Appropriate ? ?Engagement in Group:   Engaged ? ?Modes of Intervention:  Discussion ? ?Summary of Progress/Problems: ?The pt. attended group but shared very little during discussion. The pt had to be called on and asked to speak before sharing anything. ?[name] ?09/23/2021, 12:05 PM ?

## 2021-09-23 NOTE — Group Note (Addendum)
LCSW Group Therapy Note ? ?Group Date: 09/23/2021 ?Start Time: 1345 ?End Time: 1420 ? ? ?Type of Therapy and Topic:  Group Therapy - Healthy vs Unhealthy Coping Skills ? ?Participation Level:  Minimal  ? ?Description of Group ?The focus of this group was to determine what unhealthy coping techniques typically are used by group members and what healthy coping techniques would be helpful in coping with various problems. Patients were guided in becoming aware of the differences between healthy and unhealthy coping techniques. Patients were asked to identify 2-3 healthy coping skills they would like to learn to use more effectively. ? ?Therapeutic Goals ?Patients learned that coping is what human beings do all day long to deal with various situations in their lives ?Patients defined and discussed healthy vs unhealthy coping techniques ?Patients identified their preferred coping techniques and identified whether these were healthy or unhealthy ?Patients determined 2-3 healthy coping skills they would like to become more familiar with and use more often. ?Patients provided support and ideas to each other ? ? ?Summary of Patient Progress:  Patient was present for the entirety of the group session. Patient was an active listener and participated in the topic of discussion. CSW facilitated group in the courtyard. She stated that she was ready to go home. 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  ? ?Therapeutic Modalities ?Cognitive Behavioral Therapy ?Motivational Interviewing ? ?Nariah Morgano A [place], LCSWA ?09/23/2021  6:18 PM   ?

## 2021-09-23 NOTE — Progress Notes (Signed)
Recreation Therapy Notes ? ?Date: 09/23/2021 ?  ?Time: 1:45 pm  ? ?Location: Courtyard  ?  ?Behavioral response: Appropriate ?  ?Intervention Topic: Social-Skills   ?  ?Discussion/Intervention:  ?Group content on today was focused on social skills. The group defined social skills and identified ways they use social skills. Patients expressed what obstacles they face when trying to be social. Participants described the importance of social skills. The group listed ways to improve social skills and reasons to improve social skills. Individuals had an opportunity to learn new and improve social skills as well as identify their weaknesses. ?Clinical Observations/Feedback: ?Patient came to group and identified many ways to socialize with others. Participant shared experiences they have had in the past when socializing with others. Individual was social with peers and staff while participating in the intervention.    ?Carolyn Huynh LRT/CTRS  ?  ?  ? ? ? ? ? ? ? ?Carolyn Huynh ?09/23/2021 3:44 PM ?

## 2021-09-23 NOTE — B[facility] Counselor (Signed)
CSW contacted pt's daughter, Elenora Hawbaker, [phone], there was no answer, CSW left voicemail with contact information. CSW will contact at another opportune time.  ? ?Timmia Cogburn [place], MSW, LCSW-A ?4/11/202310:14 AM  ?

## 2021-09-23 NOTE — Progress Notes (Addendum)
Pt at nurse's station. No apparent distress. RR even and unlabored. Pt denies SI, HI, AVH. Rates pain 4/10 in lower back but refuses medication. Pt states that she is really white skinned underneath and is wearing a "mask." Pt is alert and oriented to self but not to time, place or situation. Says she doesn't know why she is here in hospital. Pt pleasant and cooperative. Monitored for safety.  ?

## 2021-09-23 NOTE — Progress Notes (Signed)
Patient denies SI, HI, and AVH. Patient is observed responding to internal stimuli. She denies pain and endorses sleeping well last night. Patient denies depression and anxiety. Patient ate breakfast and participated in group therapy. Patient is currently eating lunch in the day room. She remains safe on the unit at this time with Q 15 minutes safety checks in place. ?

## 2021-09-23 NOTE — Progress Notes (Signed)
[facility] ? ?09/23/2021 1:55 PM ?Carolyn Riceorothy A Huynh  ?MRN:  [id number] ?Subjective: Carolyn CellaDorothy was seen on rounds today.  She continues to refuse any other medications except for Zyprexa and she only took that because she was getting IM forced meds.  She has been pleasant and cooperative on the unit.  She has no complaints.  No issues.  She denies SI, depression, or auditory hallucinations even though she is seeing responding to internal stimuli. ? ?Principal Problem: Schizophrenia, paranoid type (HCC) ?Diagnosis: Principal Problem: ?  Schizophrenia, paranoid type (HCC) ?Active Problems: ?  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  Vascular dementia with delusions (HCC) ? ?Total Time spent with patient: 15 minutes ? ?Past Psychiatric History: Extensive ? ?Past Medical History:  ?Past Medical History:  ?Diagnosis Date  ? Asthma   ? COPD (chronic obstructive pulmonary disease) (HCC)   ? Dementia (HCC)   ? Hypertension   ? Pneumothorax   ?  ?Past Surgical History:  ?Procedure Laterality Date  ? CHEST TUBE INSERTION    ? VIDEO ASSISTED THORACOSCOPY Right 02/01/2018  ? Procedure: VIDEO ASSISTED THORACOSCOPY, BLEB STAPLING;  Surgeon: Kerin PernaVan Trigt, Peter, MD;  [facility];  Service: Thoracic;  Laterality: Right;  ? ?Family History: History reviewed. No pertinent family history. ? ?Social History:  ?Social History  ? ?Substance and Sexual Activity  ?Alcohol Use Not Currently  ?   ?Social History  ? ?Substance and Sexual Activity  ?Drug Use Never  ?  ?Social History  ? ?Socioeconomic History  ? Marital status: Significant Other  ?  Spouse name: Not on file  ? Number of children: Not on file  ? Years of education: Not on file  ? Highest education level: Not on file  ?Occupational History  ? Not on file  ?Tobacco Use  ? Smoking status: Never  ? Smokeless tobacco: Never  ?Vaping Use  ? Vaping Use: Never used  ?Substance and Sexual Activity  ? Alcohol use: Not Currently  ? Drug use: Never  ? Sexual  activity: Not Currently  ?Other Topics Concern  ? Not on file  ?Social History Narrative  ? Not on file  ? ?Social Determinants of Health  ? ?Financial Resource Strain: Not on file  ?Food Insecurity: Not on file  ?Transportation Needs: Not on file  ?Physical Activity: Not on file  ?Stress: Not on file  ?Social Connections: Not on file  ? ?Additional Social History:  ?  ?  ?  ?  ?  ?  ?  ?  ?  ?  ?  ? ?Sleep: Good ? ?Appetite:  Fair ? ?Current Medications: ?Current Facility-Administered Medications  ?Medication Dose Route Frequency Provider Last Rate Last Admin  ? acetaminophen (TYLENOL) tablet 650 mg  650 mg Oral Q6H PRN Sarina IllHerrick, Zeven Kocak Edward, DO      ? alum & mag hydroxide-simeth (MAALOX/MYLANTA) 200-200-20 MG/5ML suspension 30 mL  30 mL Oral Q4H PRN Sarina IllHerrick, Kinsler Soeder Edward, DO      ? LORazepam (ATIVAN) tablet 1 mg  1 mg Oral TID PRN Sarina IllHerrick, Aidon Klemens Edward, DO      ? Or  ? LORazepam (ATIVAN) injection 1 mg  1 mg Intramuscular TID PRN Sarina IllHerrick, Delrico Minehart Edward, DO      ? magnesium hydroxide (MILK OF MAGNESIA) suspension 30 mL  30 mL Oral Daily PRN Sarina IllHerrick, Demyah Smyre Edward, DO      ? OLANZapine (ZYPREXA) injection 10 mg  10 mg Intramuscular QHS Sarina IllHerrick, Somaya Grassi Edward,  DO   10 mg at 09/16/21 2200  ? Or  ? OLANZapine (ZYPREXA) tablet 10 mg  10 mg Oral QHS Sarina Ill, DO   10 mg at 09/22/21 2107  ? traZODone (DESYREL) tablet 100 mg  100 mg Oral QHS PRN Sarina Ill, DO      ? ? ?Lab Results: No results found for this or any previous visit (from the past 48 hour(s)). ? ?Blood Alcohol level:  ?Lab Results  ?Component Value Date  ? ETH <10 08/14/2019  ? ? ?Metabolic Disorder Labs: ?Lab Results  ?Component Value Date  ? HGBA1C 5.7 (H) 09/16/2021  ? MPG 117 09/16/2021  ? ?No results found for: PROLACTIN ?Lab Results  ?Component Value Date  ? CHOL 137 09/15/2021  ? TRIG 49 09/15/2021  ? HDL 56 09/15/2021  ? CHOLHDL 2.4 09/15/2021  ? VLDL 10 09/15/2021  ? LDLCALC 71 09/15/2021  ? ? ?Physical  Findings: ?AIMS:  , ,  ,  ,    ?CIWA:    ?COWS:    ? ?Musculoskeletal: ?Strength & Muscle Tone: within normal limits ?Gait & Station: normal ?Patient leans: N/A ? ?Psychiatric Specialty Exam: ? ?Presentation  ?General Appearance: Appropriate for Environment ? ?Eye Contact:Good ? ?Speech:Clear and Coherent; Normal Rate ? ?Speech Volume:Normal ? ?Handedness:Right ? ? ?Mood and Affect  ?Mood:Euthymic ? ?Affect:Congruent ? ? ?Thought Process  ?Thought Processes:Coherent; Disorganized ? ?Descriptions of Associations:Loose ? ?Orientation:Partial (self and place) ? ?Thought Content:Delusions ? ?History of Schizophrenia/Schizoaffective disorder:No ? ?Duration of Psychotic Symptoms:N/A ? ?Hallucinations:No data recorded ?Ideas of Reference:Delusions ? ?Suicidal Thoughts:No data recorded ?Homicidal Thoughts:No data recorded ? ?Sensorium  ?Memory:Immediate Poor; Remote Poor; Recent Poor ? ?Judgment:Impaired ? ?Insight:Poor ? ? ?Executive Functions  ?Concentration:Fair ? ?Attention Span:Fair ? ?Recall:Poor ? ?Fund of Knowledge:Fair ? ?Language:Good ? ? ?Psychomotor Activity  ?Psychomotor Activity:No data recorded ? ?Assets  ?Assets:Communication Skills; Desire for Improvement; Housing; Social Support ? ? ?Sleep  ?Sleep:No data recorded ? ? ?Physical Exam: ?Physical Exam ?Vitals and nursing note reviewed.  ?Constitutional:   ?   Appearance: Normal appearance. She is normal weight.  ?Neurological:  ?   General: No focal deficit present.  ?   Mental Status: She is alert and oriented to person, place, and time.  ?Psychiatric:     ?   Attention and Perception: Attention normal. She perceives auditory hallucinations.     ?   Mood and Affect: Mood and affect normal.     ?   Speech: Speech normal.     ?   Behavior: Behavior normal. Behavior is cooperative.     ?   Thought Content: Thought content is paranoid and delusional.     ?   Cognition and Memory: Cognition is impaired. Memory is impaired.     ?   Judgment: Judgment is impulsive  and inappropriate.  ? ?Review of Systems  ?Constitutional: Negative.   ?HENT: Negative.    ?Eyes: Negative.   ?Respiratory: Negative.    ?Cardiovascular: Negative.   ?Gastrointestinal: Negative.   ?Genitourinary: Negative.   ?Musculoskeletal: Negative.   ?Skin: Negative.   ?Neurological: Negative.   ?Endo/Heme/Allergies: Negative.   ?Psychiatric/Behavioral:  Positive for hallucinations and memory loss.   ?Blood pressure (!) 151/87, pulse 81, temperature (!) 97.4 ?F (36.3 ?C), temperature source Oral, resp. rate 18, height 5\' 2"  (1.575 m), weight 47.2 kg, SpO2 100 %. Body mass index is 19.02 kg/m?. ? ? ?Treatment Plan Summary: ?Daily contact with patient to assess and evaluate symptoms and progress  in treatment, Medication management, and Plan continue current medications.  Consider a long-acting injection but I do not think she will take it.  Social work is trying to get a hold of the daughter ? ?Sarina Ill, DO ?09/23/2021, 1:55 PM ? ?

## 2021-09-24 DIAGNOSIS — F312 Bipolar disorder, current episode manic severe with psychotic features: Secondary | ICD-10-CM | POA: Diagnosis not present

## 2021-09-24 NOTE — Progress Notes (Signed)
[facility] MD Progress Note ? ?09/24/2021 11:57 AM ?Carolyn Huynh  ?MRN:  [id number] ?Subjective: Carolyn Huynh has been compliant with Zyprexa.  She is sleeping and eating well.  She refused to take any other medication.  Her behavior has been pretty good on the unit.  Her delusions are unchanged.  She is still seen responding to internal stimuli but she is pretty much at baseline and I have spoke with social work to contact the family about discharge. ? ?Principal Problem: Schizophrenia, paranoid type (HCC) ?Diagnosis: Principal Problem: ?  Schizophrenia, paranoid type (HCC) ?Active Problems: ?  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  Vascular dementia with delusions (HCC) ? ?Total Time spent with patient: 15 minutes ? ?Past Psychiatric History: History of schizophrenia and dementia and been seen at [facility] in [place]. ? ?Past Medical History:  ?Past Medical History:  ?Diagnosis Date  ? Asthma   ? COPD (chronic obstructive pulmonary disease) (HCC)   ? Dementia (HCC)   ? Hypertension   ? Pneumothorax   ?  ?Past Surgical History:  ?Procedure Laterality Date  ? CHEST TUBE INSERTION    ? VIDEO ASSISTED THORACOSCOPY Right 02/01/2018  ? Procedure: VIDEO ASSISTED THORACOSCOPY, BLEB STAPLING;  Surgeon: Kerin Perna, MD;  Location: [facility] OR;  Service: Thoracic;  Laterality: Right;  ? ?Family History: History reviewed. No pertinent family history. ? ?Social History:  ?Social History  ? ?Substance and Sexual Activity  ?Alcohol Use Not Currently  ?   ?Social History  ? ?Substance and Sexual Activity  ?Drug Use Never  ?  ?Social History  ? ?Socioeconomic History  ? Marital status: Significant Other  ?  Spouse name: Not on file  ? Number of children: Not on file  ? Years of education: Not on file  ? Highest education level: Not on file  ?Occupational History  ? Not on file  ?Tobacco Use  ? Smoking status: Never  ? Smokeless tobacco: Never  ?Vaping Use  ? Vaping Use: Never used   ?Substance and Sexual Activity  ? Alcohol use: Not Currently  ? Drug use: Never  ? Sexual activity: Not Currently  ?Other Topics Concern  ? Not on file  ?Social History Narrative  ? Not on file  ? ?Social Determinants of Health  ? ?Financial Resource Strain: Not on file  ?Food Insecurity: Not on file  ?Transportation Needs: Not on file  ?Physical Activity: Not on file  ?Stress: Not on file  ?Social Connections: Not on file  ? ?Additional Social History:  ?  ?  ?  ?  ?  ?  ?  ?  ?  ?  ?  ? ?Sleep: Good ? ?Appetite:  Good ? ?Current Medications: ?Current Facility-Administered Medications  ?Medication Dose Route Frequency Provider Last Rate Last Admin  ? acetaminophen (TYLENOL) tablet 650 mg  650 mg Oral Q6H PRN Sarina Ill, DO      ? alum & mag hydroxide-simeth (MAALOX/MYLANTA) 200-200-20 MG/5ML suspension 30 mL  30 mL Oral Q4H PRN Sarina Ill, DO      ? LORazepam (ATIVAN) tablet 1 mg  1 mg Oral TID PRN Sarina Ill, DO      ? Or  ? LORazepam (ATIVAN) injection 1 mg  1 mg Intramuscular TID PRN Sarina Ill, DO      ? magnesium hydroxide (MILK OF MAGNESIA) suspension 30 mL  30 mL Oral Daily PRN Sarina Ill, DO      ?  OLANZapine (ZYPREXA) injection 10 mg  10 mg Intramuscular QHS Sarina Ill, DO   10 mg at 09/16/21 2200  ? Or  ? OLANZapine (ZYPREXA) tablet 10 mg  10 mg Oral QHS Sarina Ill, DO   10 mg at 09/23/21 2110  ? traZODone (DESYREL) tablet 100 mg  100 mg Oral QHS PRN Sarina Ill, DO      ? ? ?Lab Results: No results found for this or any previous visit (from the past 48 hour(s)). ? ?Blood Alcohol level:  ?Lab Results  ?Component Value Date  ? ETH <10 08/14/2019  ? ? ?Metabolic Disorder Labs: ?Lab Results  ?Component Value Date  ? HGBA1C 5.7 (H) 09/16/2021  ? MPG 117 09/16/2021  ? ?No results found for: PROLACTIN ?Lab Results  ?Component Value Date  ? CHOL 137 09/15/2021  ? TRIG 49 09/15/2021  ? HDL 56 09/15/2021  ?  CHOLHDL 2.4 09/15/2021  ? VLDL 10 09/15/2021  ? LDLCALC 71 09/15/2021  ? ? ?Physical Findings: ?AIMS:  , ,  ,  ,    ?CIWA:    ?COWS:    ? ?Musculoskeletal: ?Strength & Muscle Tone: within normal limits ?Gait & Station: normal ?Patient leans: N/A ? ?Psychiatric Specialty Exam: ? ?Presentation  ?General Appearance: Appropriate for Environment ? ?Eye Contact:Good ? ?Speech:Clear and Coherent; Normal Rate ? ?Speech Volume:Normal ? ?Handedness:Right ? ? ?Mood and Affect  ?Mood:Euthymic ? ?Affect:Congruent ? ? ?Thought Process  ?Thought Processes:Coherent; Disorganized ? ?Descriptions of Associations:Loose ? ?Orientation:Partial (self and place) ? ?Thought Content:Delusions ? ?History of Schizophrenia/Schizoaffective disorder:No ? ?Duration of Psychotic Symptoms:N/A ? ?Hallucinations:No data recorded ?Ideas of Reference:Delusions ? ?Suicidal Thoughts:No data recorded ?Homicidal Thoughts:No data recorded ? ?Sensorium  ?Memory:Immediate Poor; Remote Poor; Recent Poor ? ?Judgment:Impaired ? ?Insight:Poor ? ? ?Executive Functions  ?Concentration:Fair ? ?Attention Span:Fair ? ?Recall:Poor ? ?Fund of Knowledge:Fair ? ?Language:Good ? ? ?Psychomotor Activity  ?Psychomotor Activity:No data recorded ? ?Assets  ?Assets:Communication Skills; Desire for Improvement; Housing; Social Support ? ? ?Sleep  ?Sleep:No data recorded ? ? ?Physical Exam: ?Physical Exam ?Vitals and nursing note reviewed.  ?Constitutional:   ?   Appearance: Normal appearance. She is normal weight.  ?Neurological:  ?   General: No focal deficit present.  ?   Mental Status: She is alert and oriented to person, place, and time.  ?Psychiatric:     ?   Attention and Perception: Attention normal. She perceives auditory hallucinations.     ?   Mood and Affect: Mood and affect normal.     ?   Speech: Speech normal.     ?   Behavior: Behavior normal. Behavior is cooperative.     ?   Thought Content: Thought content is paranoid and delusional.     ?   Cognition and  Memory: Cognition is impaired. Memory is impaired.     ?   Judgment: Judgment is inappropriate.  ? ?Review of Systems  ?Constitutional: Negative.   ?HENT: Negative.    ?Eyes: Negative.   ?Respiratory: Negative.    ?Cardiovascular: Negative.   ?Gastrointestinal: Negative.   ?Genitourinary: Negative.   ?Musculoskeletal: Negative.   ?Skin: Negative.   ?Neurological: Negative.   ?Endo/Heme/Allergies: Negative.   ?Psychiatric/Behavioral:  Positive for hallucinations.   ?Blood pressure (!) 150/89, pulse 81, temperature 98.1 ?F (36.7 ?C), temperature source Oral, resp. rate 18, height 5\' 2"  (1.575 m), weight 47.2 kg, SpO2 100 %. Body mass index is 19.02 kg/m?. ? ? ?Treatment Plan Summary: ?Daily contact with patient  to assess and evaluate symptoms and progress in treatment, Medication management, and Plan continue current medications. ? ?Sarina Illichard Edward Desteni Piscopo, DO ?09/24/2021, 11:57 AM ? ?

## 2021-09-24 NOTE — Progress Notes (Signed)
Pt in dayroom at this hour. No apparent distress. Pt denies SI, HI. Pt states the only voices she is hearing are the ones in her head. Pt denies that the voices are command or frightening. Pt rates pain 8/10 in her left groin and buttocks but refuses pain medicine. Pt denies anxiety and depression. RR even and unlabored. Pt alert and oriented to self. Says she came to the hospital because she was not eating much and had pneumonia but not sure about that. Pt says she talked to her son today. Says he is famous and his name is Scotty Alphia Kava and he is a Librarian, academic. Pt pleasant. Endorses good sleep, appetite and feeling good today. Pt Monitored for safety.  ?

## 2021-09-24 NOTE — Progress Notes (Signed)
Recreation Therapy Notes ? ? ?Date: 09/24/2021 ? ?Time: 1:45pm   ? ?Location: Dayroom   ? ?Behavioral response: N/A ?  ?Intervention Topic: Problem Solving  ? ?Discussion/Intervention: ?Patient refused to attend group.  ? ?Clinical Observations/Feedback:  ?Patient refused to attend group.  ?  ?Jaiya Mooradian LRT/CTRS ? ? ? ? ? ? ? ? ?Layah Skousen ?09/24/2021 3:13 PM ?

## 2021-09-24 NOTE — BHH Counselor (Signed)
CSW contacted pt's daughter, [place], [place] [phone] , regarding discharge. They stated that pt is unable to be picked up Friday and would be available to provide transportation Saturday or early next week.  ? ?CSW will follow up with attending physician to update potential discharge plan. No other requests were made. Conversation ended without incident.  ? ?Rosene Pilling [place], MSW, LCSW-A ?4/12/20232:11 PM  ?

## 2021-09-24 NOTE — Progress Notes (Signed)
Patient denies SI, HI, and AVH. Patient is observed responding to internal stimuli. She denies pain and endorses sleeping well last night. Patient denies depression and anxiety. Patient states that " I feel kind of good inside." Patient ate breakfast and participated in group therapy. Patient is currently eating lunch in the day room. She remains safe on the unit at this time with Q 15 minutes safety checks in place ?

## 2021-09-25 DIAGNOSIS — F312 Bipolar disorder, current episode manic severe with psychotic features: Secondary | ICD-10-CM | POA: Diagnosis not present

## 2021-09-25 NOTE — Progress Notes (Signed)
[facility] MD Progress Note ? ?09/25/2021 11:23 AM ?Carolyn Huynh  ?MRN:  [id number] ?Subjective: Carolyn Huynh's behavior is improved.  She has been compliant with Zyprexa at bedtime.  She has not required IM forced meds.  She refuses to take any other medications.  I told her that she has high blood pressure and she said that she is not going to take any blood pressure medicine.  Social work spoke with the daughter who said that she can pick her up until Monday.  Anwar has been pleasant and cooperative and delusional.   ? ?Principal Problem: Schizophrenia, paranoid type (HCC) ?Diagnosis: Principal Problem: ?  Schizophrenia, paranoid type (HCC) ?Active Problems: ?  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  Vascular dementia with delusions (HCC) ? ?Total Time spent with patient: 15 minutes ? ?Past Psychiatric History: History of schizophrenia and dementia and been seen at [facility] in [place]. ?  ? ?Past Medical History:  ?Past Medical History:  ?Diagnosis Date  ? Asthma   ? COPD (chronic obstructive pulmonary disease) (HCC)   ? Dementia (HCC)   ? Hypertension   ? Pneumothorax   ?  ?Past Surgical History:  ?Procedure Laterality Date  ? CHEST TUBE INSERTION    ? VIDEO ASSISTED THORACOSCOPY Right 02/01/2018  ? Procedure: VIDEO ASSISTED THORACOSCOPY, BLEB STAPLING;  Surgeon: Kerin Perna, MD;  Location: [facility] OR;  Service: Thoracic;  Laterality: Right;  ? ?Family History: History reviewed. No pertinent family history. ? ?Social History:  ?Social History  ? ?Substance and Sexual Activity  ?Alcohol Use Not Currently  ?   ?Social History  ? ?Substance and Sexual Activity  ?Drug Use Never  ?  ?Social History  ? ?Socioeconomic History  ? Marital status: Significant Other  ?  Spouse name: Not on file  ? Number of children: Not on file  ? Years of education: Not on file  ? Highest education level: Not on file  ?Occupational History  ? Not on file  ?Tobacco Use  ? Smoking  status: Never  ? Smokeless tobacco: Never  ?Vaping Use  ? Vaping Use: Never used  ?Substance and Sexual Activity  ? Alcohol use: Not Currently  ? Drug use: Never  ? Sexual activity: Not Currently  ?Other Topics Concern  ? Not on file  ?Social History Narrative  ? Not on file  ? ?Social Determinants of Health  ? ?Financial Resource Strain: Not on file  ?Food Insecurity: Not on file  ?Transportation Needs: Not on file  ?Physical Activity: Not on file  ?Stress: Not on file  ?Social Connections: Not on file  ? ?Additional Social History:  ?  ?  ?  ?  ?  ?  ?  ?  ?  ?  ?  ? ?Sleep: Good ? ?Appetite:  Good ? ?Current Medications: ?Current Facility-Administered Medications  ?Medication Dose Route Frequency Provider Last Rate Last Admin  ? acetaminophen (TYLENOL) tablet 650 mg  650 mg Oral Q6H PRN Sarina Ill, DO      ? alum & mag hydroxide-simeth (MAALOX/MYLANTA) 200-200-20 MG/5ML suspension 30 mL  30 mL Oral Q4H PRN Sarina Ill, DO      ? LORazepam (ATIVAN) tablet 1 mg  1 mg Oral TID PRN Sarina Ill, DO      ? Or  ? LORazepam (ATIVAN) injection 1 mg  1 mg Intramuscular TID PRN Sarina Ill, DO      ? magnesium  hydroxide (MILK OF MAGNESIA) suspension 30 mL  30 mL Oral Daily PRN Sarina Ill, DO      ? OLANZapine (ZYPREXA) injection 10 mg  10 mg Intramuscular QHS Sarina Ill, DO   10 mg at 09/16/21 2200  ? Or  ? OLANZapine (ZYPREXA) tablet 10 mg  10 mg Oral QHS Sarina Ill, DO   10 mg at 09/24/21 2110  ? traZODone (DESYREL) tablet 100 mg  100 mg Oral QHS PRN Sarina Ill, DO      ? ? ?Lab Results: No results found for this or any previous visit (from the past 48 hour(s)). ? ?Blood Alcohol level:  ?Lab Results  ?Component Value Date  ? ETH <10 08/14/2019  ? ? ?Metabolic Disorder Labs: ?Lab Results  ?Component Value Date  ? HGBA1C 5.7 (H) 09/16/2021  ? MPG 117 09/16/2021  ? ?No results found for: PROLACTIN ?Lab Results  ?Component  Value Date  ? CHOL 137 09/15/2021  ? TRIG 49 09/15/2021  ? HDL 56 09/15/2021  ? CHOLHDL 2.4 09/15/2021  ? VLDL 10 09/15/2021  ? LDLCALC 71 09/15/2021  ? ? ?Physical Findings: ?AIMS:  , ,  ,  ,    ?CIWA:    ?COWS:    ? ?Musculoskeletal: ?Strength & Muscle Tone: within normal limits ?Gait & Station: normal ?Patient leans: N/A ? ?Psychiatric Specialty Exam: ? ?Presentation  ?General Appearance: Appropriate for Environment ? ?Eye Contact:Good ? ?Speech:Clear and Coherent; Normal Rate ? ?Speech Volume:Normal ? ?Handedness:Right ? ? ?Mood and Affect  ?Mood:Euthymic ? ?Affect:Congruent ? ? ?Thought Process  ?Thought Processes:Coherent; Disorganized ? ?Descriptions of Associations:Loose ? ?Orientation:Partial (self and place) ? ?Thought Content:Delusions ? ?History of Schizophrenia/Schizoaffective disorder:No ? ?Duration of Psychotic Symptoms:N/A ? ?Hallucinations:No data recorded ?Ideas of Reference:Delusions ? ?Suicidal Thoughts:No data recorded ?Homicidal Thoughts:No data recorded ? ?Sensorium  ?Memory:Immediate Poor; Remote Poor; Recent Poor ? ?Judgment:Impaired ? ?Insight:Poor ? ? ?Executive Functions  ?Concentration:Fair ? ?Attention Span:Fair ? ?Recall:Poor ? ?Fund of Knowledge:Fair ? ?Language:Good ? ? ?Psychomotor Activity  ?Psychomotor Activity:No data recorded ? ?Assets  ?Assets:Communication Skills; Desire for Improvement; Housing; Social Support ? ? ?Sleep  ?Sleep:No data recorded ? ? ?Physical Exam: ?Physical Exam ?ROS ?Blood pressure (!) 147/83, pulse 83, temperature (!) 97.5 ?F (36.4 ?C), temperature source Oral, resp. rate 18, height 5\' 2"  (1.575 m), weight 47.2 kg, SpO2 99 %. Body mass index is 19.02 kg/m?. ? ? ?Treatment Plan Summary: ?Daily contact with patient to assess and evaluate symptoms and progress in treatment, Medication management, and Plan continue current medications and discharge Monday. ? ?Tuesday, DO ?09/25/2021, 11:23 AM ? ?

## 2021-09-25 NOTE — BHH Group Notes (Signed)
[facility] Group Notes:  (Nursing/MHT/Case Management/Adjunct) ? ?Date:  09/25/2021  ?Time:  10:00 AM ? ?Type of Therapy:  Psychoeducational Skills ? ?Participation Level:  Active ? ?Participation Quality:  Appropriate ? ?Affect:  Appropriate ? ?Cognitive:  Appropriate ? ?Insight:  Appropriate ? ?Engagement in Group:  Engaged ? ?Modes of Intervention:  Discussion ? ?Summary of Progress/Problems: ?The pt attended group and was actively engaged during discussion. ?[name] ?09/25/2021, 11:13 AM ?

## 2021-09-25 NOTE — Progress Notes (Signed)
Recreation Therapy Notes ? ? ?Date: 09/25/2021 ? ?Time: 2:05pm    ? ?Location: Courtyard   ? ?Behavioral response: Appropriate ? ?Intervention Topic: Communication   ? ?Discussion/Intervention:  ?Group content today was focused on communication. The group defined communication and ways to communicate with others. Individuals stated reason why communication is important and some reasons to communicate with others. Patients expressed if they thought they were good at communicating with others and ways they could improve their communication skills. The group identified important parts of communication and some experiences they have had in the past with communication. The group participated in the intervention ?What is that??, where they had a chance to test out their communication skills and identify ways to improve their communication techniques. ?Clinical Observations/Feedback: ?Patient came to group and identified communicating face to face as her preference of communication. She expressed has past experiences with communication. Individual was social with peers and staff while participating in the intervention.    ?Brandan Glauber LRT/CTRS  ? ? ? ? ? ? ? ?Carolyn Huynh ?09/25/2021 3:49 PM ?

## 2021-09-25 NOTE — Progress Notes (Signed)
Patient is alert and oriented to self but disoriented to place, time,and situation. ?Patient denies SI, HI, and AVH. She denies pain and endorses sleeping well last night. Patient denies depression and anxiety. Patient states that " I feel kind of good inside." Patient ate breakfast and participated in group therapy. Patient is currently eating lunch in the day room. She remains safe on the unit at this time with Q 15 minutes safety checks in place ?

## 2021-09-25 NOTE — BH IP Treatment Plan (Signed)
Interdisciplinary Treatment and Diagnostic Plan Update ? ?09/25/2021 ?Time of Session: 9:30AM ?[name] ?MRN: [id number] ? ?Principal Diagnosis: Schizophrenia, paranoid type (HCC) ? ?Secondary Diagnoses: Principal Problem: ?  Schizophrenia, paranoid type (HCC) ?Active Problems: ?  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  Vascular dementia with delusions (HCC) ? ? ?Current Medications:  ?Current Facility-Administered Medications  ?Medication Dose Route Frequency Provider Last Rate Last Admin  ? acetaminophen (TYLENOL) tablet 650 mg  650 mg Oral Q6H PRN Sarina Ill, DO      ? alum & mag hydroxide-simeth (MAALOX/MYLANTA) 200-200-20 MG/5ML suspension 30 mL  30 mL Oral Q4H PRN Sarina Ill, DO      ? LORazepam (ATIVAN) tablet 1 mg  1 mg Oral TID PRN Sarina Ill, DO      ? Or  ? LORazepam (ATIVAN) injection 1 mg  1 mg Intramuscular TID PRN Sarina Ill, DO      ? magnesium hydroxide (MILK OF MAGNESIA) suspension 30 mL  30 mL Oral Daily PRN Sarina Ill, DO      ? OLANZapine (ZYPREXA) injection 10 mg  10 mg Intramuscular QHS Sarina Ill, DO   10 mg at 09/16/21 2200  ? Or  ? OLANZapine (ZYPREXA) tablet 10 mg  10 mg Oral QHS Sarina Ill, DO   10 mg at 09/24/21 2110  ? traZODone (DESYREL) tablet 100 mg  100 mg Oral QHS PRN Sarina Ill, DO      ? ?PTA Medications: ?Medications Prior to Admission  ?Medication Sig Dispense Refill Last Dose  ? acetaminophen (TYLENOL) 325 MG tablet Take 2 tablets (650 mg total) by mouth every 6 (six) hours as needed for mild pain (or Fever >/= 101). (Patient not taking: Reported on 09/05/2021)     ? hydrochlorothiazide (MICROZIDE) 12.5 MG capsule Take 1 capsule (12.5 mg total) by mouth daily. (Patient not taking: Reported on 09/05/2021) 30 capsule 3   ? ondansetron (ZOFRAN) 4 MG tablet Take 1 tablet (4 mg total) by mouth every 8 (eight) hours as needed for up to 10 doses for  nausea or vomiting. (Patient not taking: Reported on 09/05/2021) 10 tablet 0   ? potassium chloride (K-DUR,KLOR-CON) 10 MEQ tablet Take 1 tablet (10 mEq total) by mouth daily. (Patient not taking: Reported on 09/05/2021) 30 tablet 3   ? ? ?Patient Stressors: Other: Does not know why she is here.   ? ?Patient Strengths: Supportive family/friends  ? ?Treatment Modalities: Medication Management, Group therapy, Case management,  ?1 to 1 session with clinician, Psychoeducation, Recreational therapy. ? ? ?Physician Treatment Plan for Primary Diagnosis: Schizophrenia, paranoid type (HCC) ?Long Term Goal(s): Improvement in symptoms so as ready for discharge  ? ?Short Term Goals: Compliance with prescribed medications will improve ? ?Medication Management: Evaluate patient's response, side effects, and tolerance of medication regimen. ? ?Therapeutic Interventions: 1 to 1 sessions, Unit Group sessions and Medication administration. ? ?Evaluation of Outcomes: Adequate for Discharge ? ?Physician Treatment Plan for Secondary Diagnosis: Principal Problem: ?  Schizophrenia, paranoid type (HCC) ?Active Problems: ?  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  Vascular dementia with delusions (HCC) ? ?Long Term Goal(s): Improvement in symptoms so as ready for discharge  ? ?Short Term Goals: Compliance with prescribed medications will improve    ? ?Medication Management: Evaluate patient's response, side effects, and tolerance of medication regimen. ? ?Therapeutic Interventions: 1 to 1 sessions, Unit Group sessions and Medication administration. ? ?  Evaluation of Outcomes: Adequate for Discharge ? ? ?RN Treatment Plan for Primary Diagnosis: Schizophrenia, paranoid type (HCC) ?Long Term Goal(s): Knowledge of disease and therapeutic regimen to maintain health will improve ? ?Short Term Goals: Ability to remain free from injury will improve, Ability to verbalize frustration and anger appropriately will improve, Ability  to demonstrate self-control, Ability to participate in decision making will improve, Ability to verbalize feelings will improve, Ability to identify and develop effective coping behaviors will improve, and Compliance with prescribed medications will improve ? ?Medication Management: RN will administer medications as ordered by provider, will assess and evaluate patient's response and provide education to patient for prescribed medication. RN will report any adverse and/or side effects to prescribing provider. ? ?Therapeutic Interventions: 1 on 1 counseling sessions, Psychoeducation, Medication administration, Evaluate responses to treatment, Monitor vital signs and CBGs as ordered, Perform/monitor CIWA, COWS, AIMS and Fall Risk screenings as ordered, Perform wound care treatments as ordered. ? ?Evaluation of Outcomes: Adequate for Discharge ? ? ?LCSW Treatment Plan for Primary Diagnosis: Schizophrenia, paranoid type (HCC) ?Long Term Goal(s): Safe transition to appropriate next level of care at discharge, Engage patient in therapeutic group addressing interpersonal concerns. ? ?Short Term Goals: Engage patient in aftercare planning with referrals and resources, Increase social support, Increase ability to appropriately verbalize feelings, Increase emotional regulation, Facilitate acceptance of mental health diagnosis and concerns, Identify triggers associated with mental health/substance abuse issues, and Increase skills for wellness and recovery ? ?Therapeutic Interventions: Assess for all discharge needs, 1 to 1 time with Child psychotherapist, Explore available resources and support systems, Assess for adequacy in community support network, Educate family and significant other(s) on suicide prevention, Complete Psychosocial Assessment, Interpersonal group therapy. ? ?Evaluation of Outcomes: Adequate for Discharge ? ? ?Progress in Treatment: ?Attending groups: Yes. ?Participating in groups: Yes. ?Taking medication as  prescribed: Yes. ?Toleration medication: Yes. ?Family/Significant other contact made: Yes, individual(s) contacted:  SPE completed with pt's daughter, Khailee Mick ?Patient understands diagnosis: No. ?Discussing patient identified problems/goals with staff: Yes. ?Medical problems stabilized or resolved: Yes. ?Denies suicidal/homicidal ideation: Yes. ?Issues/concerns per patient self-inventory: No. ?Other: None ? ?New problem(s) identified: No, Describe:  None ? ?New Short Term/Long Term Goal(s): Patient to work towards elimination of symptoms of psychosis, medication management for mood stabilization;  development of comprehensive mental wellness plan.Update 09/20/2021: No changes at this time. Update 09/25/21: No changes at this time.  ?  ?Patient Goals:  "wanna go home" Update 09/20/2021: No changes at this time. Update 09/25/21: No changes at this time.  ? ?  ?Discharge Plan or Barriers: CSW will assist pt with development of appropriate discharge/aftercare plan. Update 09/20/2021: No changes at this time. Update 09/25/21: No changes at this time.  ? ?Estimated Length of Stay: TBD ? ?Last 3 [place] Suicide Severity Risk Score: ?Flowsheet Row Admission (Current) from 09/09/2021 in [facility] BE[facility]AVIORAL [facility]INE ED from 09/05/2021 in [facility] EM[facility]GENCY DEPART[facility] ED from 08/14/2019 in [facility]  ?C-SSRS RISK CATEGORY No Risk No Risk Error: Question 6 not populated  ? ?  ? ? ?Last PHQ 2/9 Scores: ?   ? View : No data to display.  ?  ?  ?  ? ? ?Scribe for Treatment Team: ?Verdell Dykman A [place], LCSWA ?09/25/2021 ?3:05 PM ? ? ?
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
## 2021-09-26 DIAGNOSIS — F200 Paranoid schizophrenia: Secondary | ICD-10-CM

## 2021-09-26 MED ORDER — AMLODIPINE BESYLATE 5 MG PO TABS
5.0000 mg | ORAL_TABLET | Freq: Every day | ORAL | Status: DC
Start: 2021-09-26 — End: 2021-09-29
  Administered 2021-09-26 – 2021-09-29 (×4): 5 mg via ORAL
  Filled 2021-09-26 (×4): qty 1

## 2021-09-26 NOTE — Progress Notes (Addendum)
Recreation Therapy Notes ? ?Date: 09/26/2021 ?  ?Time: 1:45pm  ?  ?Location: Dayroom   ?  ?Behavioral response: Appropriate ?  ?Intervention Topic: Relaxation   ?  ?Discussion/Intervention:  ?Group content today was focused on relaxation. The group defined relaxation and identified healthy ways to relax. Individuals expressed how much time they spend relaxing. Patients expressed how much their life would be if they did not make time for themselves to relax. The group stated ways they could improve their relaxation techniques in the future.  Individuals participated in the intervention ?Time to Relax? where they had a chance to experience different relaxation techniques.  ?Clinical Observations/Feedback: ?Patient came to group and identified sleep and how she relaxes. She identified working as reasons she is not able to relax. Participant stated that she like to relax by sitting by the pool. Patient expressed that relaxing is important to keep body and mind healthy. Individual was social with peers and staff while participating in the intervention.    ?Laxmi Choung LRT/CTRS  ? ? ? ? ? ? ? ?Ferrel Simington ?09/26/2021 2:26 PM ?

## 2021-09-26 NOTE — Plan of Care (Signed)
Patient presents alert and oriented to self. Patient thoughts are delusional and she is paranoia. Patient denies SI, HI, and denies AVH though observed responding to internal stimuli. Patient denies depression, anxiety or pain. Patient ate breakfast and lunch in the day room and spent most of shift in day room. Ongoing Q15 minutes safety check rounds per unit protocol.  ? ? ?Problem: Education: ?Goal: Knowledge of [facility] Education information/materials will improve ?Outcome: Progressing ?Goal: Emotional status will improve ?Outcome: Progressing ?Goal: Mental status will improve ?Outcome: Progressing ?Goal: Verbalization of understanding the information provided will improve ?Outcome: Progressing ?  ?Problem: Activity: ?Goal: Interest or engagement in activities will improve ?Outcome: Progressing ?Goal: Sleeping patterns will improve ?Outcome: Progressing ?  ?Problem: Coping: ?Goal: Ability to verbalize frustrations and anger appropriately will improve ?Outcome: Progressing ?Goal: Ability to demonstrate self-control will improve ?Outcome: Progressing ?  ?Problem: Health Behavior/Discharge Planning: ?Goal: Identification of resources available to assist in meeting health care needs will improve ?Outcome: Progressing ?Goal: Compliance with treatment plan for underlying cause of condition will improve ?Outcome: Progressing ?  ?Problem: Physical Regulation: ?Goal: Ability to maintain clinical measurements within normal limits will improve ?Outcome: Progressing ?  ?Problem: Safety: ?Goal: Periods of time without injury will increase ?Outcome: Progressing ?  ?

## 2021-09-26 NOTE — Progress Notes (Signed)
[facility] MD Progress Note ? ?09/26/2021 2:58 PM ?Carolyn Huynh  ?MRN:  [id number] ?Subjective: Follow-up for this 66 year old woman with schizophrenia.  Patient was pleasant in conversation.  She tends to sit by herself in the day room and often is staring off into space with her lips moving as if she is talking to someone.  When I approached her however she was easily redirected into conversation.  Somewhat vague and rambling about the blood pressure issue.  No complaints about medicine.  No agitation and no hostility.  Taking care of herself adequately and cooperative with medicine. ?Principal Problem: Schizophrenia, paranoid type (HCC) ?Diagnosis: Principal Problem: ?  Schizophrenia, paranoid type (HCC) ?Active Problems: ?  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  Vascular dementia with delusions (HCC) ? ?Total Time spent with patient: 30 minutes ? ?Past Psychiatric History: Past history of schizophrenia ? ?Past Medical History:  ?Past Medical History:  ?Diagnosis Date  ? Asthma   ? COPD (chronic obstructive pulmonary disease) (HCC)   ? Dementia (HCC)   ? Hypertension   ? Pneumothorax   ?  ?Past Surgical History:  ?Procedure Laterality Date  ? CHEST TUBE INSERTION    ? VIDEO ASSISTED THORACOSCOPY Right 02/01/2018  ? Procedure: VIDEO ASSISTED THORACOSCOPY, BLEB STAPLING;  Surgeon: Kerin Perna, MD;  Location: [facility] OR;  Service: Thoracic;  Laterality: Right;  ? ?Family History: History reviewed. No pertinent family history. ?Family Psychiatric  History: See previous ?Social History:  ?Social History  ? ?Substance and Sexual Activity  ?Alcohol Use Not Currently  ?   ?Social History  ? ?Substance and Sexual Activity  ?Drug Use Never  ?  ?Social History  ? ?Socioeconomic History  ? Marital status: Significant Other  ?  Spouse name: Not on file  ? Number of children: Not on file  ? Years of education: Not on file  ? Highest education level: Not on file  ?Occupational History  ? Not on file  ?Tobacco  Use  ? Smoking status: Never  ? Smokeless tobacco: Never  ?Vaping Use  ? Vaping Use: Never used  ?Substance and Sexual Activity  ? Alcohol use: Not Currently  ? Drug use: Never  ? Sexual activity: Not Currently  ?Other Topics Concern  ? Not on file  ?Social History Narrative  ? Not on file  ? ?Social Determinants of Health  ? ?Financial Resource Strain: Not on file  ?Food Insecurity: Not on file  ?Transportation Needs: Not on file  ?Physical Activity: Not on file  ?Stress: Not on file  ?Social Connections: Not on file  ? ?Additional Social History:  ?  ?  ?  ?  ?  ?  ?  ?  ?  ?  ?  ? ?Sleep: Fair ? ?Appetite:  Fair ? ?Current Medications: ?Current Facility-Administered Medications  ?Medication Dose Route Frequency Provider Last Rate Last Admin  ? acetaminophen (TYLENOL) tablet 650 mg  650 mg Oral Q6H PRN Sarina Ill, DO      ? alum & mag hydroxide-simeth (MAALOX/MYLANTA) 200-200-20 MG/5ML suspension 30 mL  30 mL Oral Q4H PRN Sarina Ill, DO      ? LORazepam (ATIVAN) tablet 1 mg  1 mg Oral TID PRN Sarina Ill, DO      ? Or  ? LORazepam (ATIVAN) injection 1 mg  1 mg Intramuscular TID PRN Sarina Ill, DO      ? magnesium hydroxide (MILK OF MAGNESIA) suspension 30 mL  30 mL Oral Daily PRN Sarina IllHerrick, Richard Edward, DO      ? OLANZapine [facility](ZYPREXA) injection 10 mg  10 mg Intramuscular QHS Sarina IllHerrick, Richard Edward, DO   10 mg at 09/16/21 2200  ? Or  ? OLANZapine (ZYPREXA) tablet 10 mg  10 mg Oral QHS Sarina IllHerrick, Richard Edward, DO   10 mg at 09/25/21 2121  ? traZODone (DESYREL) tablet 100 mg  100 mg Oral QHS PRN Sarina IllHerrick, Richard Edward, DO      ? ? ?Lab Results: No results found for this or any previous visit (from the past 48 hour(s)). ? ?Blood Alcohol level:  ?Lab Results  ?Component Value Date  ? ETH <10 08/14/2019  ? ? ?Metabolic Disorder Labs: ?Lab Results  ?Component Value Date  ? HGBA1C 5.7 (H) 09/16/2021  ? MPG 117 09/16/2021  ? ?No results found for: PROLACTIN ?Lab Results   ?Component Value Date  ? CHOL 137 09/15/2021  ? TRIG 49 09/15/2021  ? HDL 56 09/15/2021  ? CHOLHDL 2.4 09/15/2021  ? VLDL 10 09/15/2021  ? LDLCALC 71 09/15/2021  ? ? ?Physical Findings: ?AIMS:  , ,  ,  ,    ?CIWA:    ?COWS:    ? ?Musculoskeletal: ?Strength & Muscle Tone: within normal limits ?Gait & Station: normal ?Patient leans: N/A ? ?Psychiatric Specialty Exam: ? ?Presentation  ?General Appearance: Appropriate for Environment ? ?Eye Contact:Good ? ?Speech:Clear and Coherent; Normal Rate ? ?Speech Volume:Normal ? ?Handedness:Right ? ? ?Mood and Affect  ?Mood:Euthymic ? ?Affect:Congruent ? ? ?Thought Process  ?Thought Processes:Coherent; Disorganized ? ?Descriptions of Associations:Loose ? ?Orientation:Partial (self and place) ? ?Thought Content:Delusions ? ?History of Schizophrenia/Schizoaffective disorder:No ? ?Duration of Psychotic Symptoms:N/A ? ?Hallucinations:No data recorded ?Ideas of Reference:Delusions ? ?Suicidal Thoughts:No data recorded ?Homicidal Thoughts:No data recorded ? ?Sensorium  ?Memory:Immediate Poor; Remote Poor; Recent Poor ? ?Judgment:Impaired ? ?Insight:Poor ? ? ?Executive Functions  ?Concentration:Fair ? ?Attention Span:Fair ? ?Recall:Poor ? ?Fund of Knowledge:Fair ? ?Language:Good ? ? ?Psychomotor Activity  ?Psychomotor Activity:No data recorded ? ?Assets  ?Assets:Communication Skills; Desire for Improvement; Housing; Social Support ? ? ?Sleep  ?Sleep:No data recorded ? ? ?Physical Exam: ?Physical Exam ?Vitals and nursing note reviewed.  ?Constitutional:   ?   Appearance: Normal appearance.  ?HENT:  ?   Head: Normocephalic and atraumatic.  ?   Mouth/Throat:  ?   Pharynx: Oropharynx is clear.  ?Eyes:  ?   Pupils: Pupils are equal, round, and reactive to light.  ?Cardiovascular:  ?   Rate and Rhythm: Normal rate and regular rhythm.  ?Pulmonary:  ?   Effort: Pulmonary effort is normal.  ?   Breath sounds: Normal breath sounds.  ?Abdominal:  ?   General: Abdomen is flat.  ?   Palpations:  Abdomen is soft.  ?Musculoskeletal:     ?   General: Normal range of motion.  ?Skin: ?   General: Skin is warm and dry.  ?Neurological:  ?   General: No focal deficit present.  ?   Mental Status: She is alert. Mental status is at baseline.  ?Psychiatric:     ?   Attention and Perception: She is inattentive. She perceives auditory hallucinations.     ?   Mood and Affect: Mood normal. Affect is blunt.     ?   Speech: Speech normal.     ?   Behavior: Behavior is slowed.     ?   Thought Content: Thought content normal.     ?   Cognition  and Memory: Cognition is impaired.  ? ?Review of Systems  ?Constitutional: Negative.   ?HENT: Negative.    ?Eyes: Negative.   ?Respiratory: Negative.    ?Cardiovascular: Negative.   ?Gastrointestinal: Negative.   ?Musculoskeletal: Negative.   ?Skin: Negative.   ?Neurological: Negative.   ?Psychiatric/Behavioral: Negative.    ?Blood pressure (!) 151/88, pulse 75, temperature 98.1 ?F (36.7 ?C), temperature source Oral, resp. rate 18, height 5\' 2"  (1.575 m), weight 47.2 kg, SpO2 97 %. Body mass index is 19.02 kg/m?. ? ? ?Treatment Plan Summary: ?Medication management and Plan patient is running slightly high blood pressure and would probably be better off with at least some treatment.  I am going to add a very low-dose of amlodipine and see if she will take it when it is offered.  Otherwise doing well with her current psychiatric medicine it looks like she might be ready for discharge in a couple days ? ? , MD ?09/26/2021, 2:58 PM ? ?

## 2021-09-26 NOTE — [facility] Counselor (Signed)
CSW discussed with pt discharge planning. CSW informed pt that [name], would be providing transportation at discharge, scheduled for Monday. When asked about scheduling a follow up appointment, pt stated that she has a doctor and in [place] and one in [place]. When asked for the name of the provider, pt stated "PW" and that he saw her on the unit visiting her today. There was no provider on the unit and pt continues to endorse visual hallucinations.  ? ?Carolyn Huynh, MSW, LCSW-A ?4/14/20234:07 PM  ?

## 2021-09-26 NOTE — [facility] Counselor (Signed)
CSW contacted pt's daughter Elnita Maxwell  ?[phone] regarding discharge planning. She stated that she or her sister would be able to provide transportation for pt and would contact CSW back regarding time availability.  ? ?No other requests were made. Conversation ended without incident.  ? ?Shakya Sebring [place], MSW, LCSW-A ?4/14/20233:20 PM  ?

## 2021-09-27 NOTE — Group Note (Signed)
[facility] LCSW Group Therapy Note ? ? ?Group Date: 09/27/2021 ?Start Time: 1300 ?End Time: 1400 ? ? ?Type of Therapy and Topic: Group Therapy: Avoiding Self-Sabotaging and Enabling Behaviors ? ?Participation Level: Did Not Attend ? ?Mood: X ? ?Description of Group:  ?In this group, patients will learn how to identify obstacles, self-sabotaging and enabling behaviors, as well as: what are they, why do we do them and what needs these behaviors meet. Discuss unhealthy relationships and how to have positive healthy boundaries with those that sabotage and enable. Explore aspects of self-sabotage and enabling in yourself and how to limit these self-destructive behaviors in everyday life. ? ? ?Therapeutic Goals: ?1. Patient will identify one obstacle that relates to self-sabotage and enabling behaviors ?2. Patient will identify one personal self-sabotaging or enabling behavior they did prior to admission ?3. Patient will state a plan to change the above identified behavior ?4. Patient will demonstrate ability to communicate their needs through discussion and/or role play.  ? ? ?Summary of Patient Progress: Due to limited staffing, group was not held on the unit.  ? ? ? ? ?Therapeutic Modalities:  ?Cognitive Behavioral Therapy ?Person-Centered Therapy ?Motivational Interviewing ? ? ? ?Carolyn Huynh, LCSWA ?

## 2021-09-27 NOTE — Progress Notes (Signed)
[facility] MD Progress Note ? ?09/27/2021 12:22 PM ?Carolyn Huynh  ?MRN:  [id number] ?Subjective: Follow-up 66 year old woman with schizophrenia.  Patient was sitting up in the day room eating lunch.  No new physical complaints.  Said she was taking a nap earlier which is why she was late starting her lunch.  Patient says she has a mild headache today but already took some Tylenol for it.  She tells me that she is waiting for her blood pressure to come down so she can go home which shows that she sees there is some connection between medicine compliance and discharge.  She did take the ordered amlodipine today and her blood pressure is looking reasonably all right. ?Principal Problem: Schizophrenia, paranoid type (HCC) ?Diagnosis: Principal Problem: ?  Schizophrenia, paranoid type (HCC) ?Active Problems: ?  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  Vascular dementia with delusions (HCC) ? ?Total Time spent with patient: 30 minutes ? ?Past Psychiatric History: Past history of schizophrenia.  Doing much better now.  Possibly ready for discharge soon ? ?Past Medical History:  ?Past Medical History:  ?Diagnosis Date  ? Asthma   ? COPD (chronic obstructive pulmonary disease) (HCC)   ? Dementia (HCC)   ? Hypertension   ? Pneumothorax   ?  ?Past Surgical History:  ?Procedure Laterality Date  ? CHEST TUBE INSERTION    ? VIDEO ASSISTED THORACOSCOPY Right 02/01/2018  ? Procedure: VIDEO ASSISTED THORACOSCOPY, BLEB STAPLING;  Surgeon: Kerin Perna, MD;  Location: [facility] OR;  Service: Thoracic;  Laterality: Right;  ? ?Family History: History reviewed. No pertinent family history. ?Family Psychiatric  History: See previous ?Social History:  ?Social History  ? ?Substance and Sexual Activity  ?Alcohol Use Not Currently  ?   ?Social History  ? ?Substance and Sexual Activity  ?Drug Use Never  ?  ?Social History  ? ?Socioeconomic History  ? Marital status: Significant Other  ?  Spouse name: Not on file  ? Number of  children: Not on file  ? Years of education: Not on file  ? Highest education level: Not on file  ?Occupational History  ? Not on file  ?Tobacco Use  ? Smoking status: Never  ? Smokeless tobacco: Never  ?Vaping Use  ? Vaping Use: Never used  ?Substance and Sexual Activity  ? Alcohol use: Not Currently  ? Drug use: Never  ? Sexual activity: Not Currently  ?Other Topics Concern  ? Not on file  ?Social History Narrative  ? Not on file  ? ?Social Determinants of Health  ? ?Financial Resource Strain: Not on file  ?Food Insecurity: Not on file  ?Transportation Needs: Not on file  ?Physical Activity: Not on file  ?Stress: Not on file  ?Social Connections: Not on file  ? ?Additional Social History:  ?  ?  ?  ?  ?  ?  ?  ?  ?  ?  ?  ? ?Sleep: Fair ? ?Appetite:  Fair ? ?Current Medications: ?Current Facility-Administered Medications  ?Medication Dose Route Frequency Provider Last Rate Last Admin  ? acetaminophen (TYLENOL) tablet 650 mg  650 mg Oral Q6H PRN Sarina Ill, DO   650 mg at 09/27/21 8299  ? alum & mag hydroxide-simeth (MAALOX/MYLANTA) 200-200-20 MG/5ML suspension 30 mL  30 mL Oral Q4H PRN Sarina Ill, DO      ? amLODipine (NORVASC) tablet 5 mg  5 mg Oral Daily Madelyn Tlatelpa, Jackquline Denmark, MD   5 mg at 09/27/21  0911  ? LORazepam (ATIVAN) tablet 1 mg  1 mg Oral TID PRN Sarina IllHerrick, Richard Edward, DO      ? Or  ? LORazepam (ATIVAN) injection 1 mg  1 mg Intramuscular TID PRN Sarina IllHerrick, Richard Edward, DO      ? magnesium hydroxide (MILK OF MAGNESIA) suspension 30 mL  30 mL Oral Daily PRN Sarina IllHerrick, Richard Edward, DO      ? OLANZapine (ZYPREXA) injection 10 mg  10 mg Intramuscular QHS Sarina IllHerrick, Richard Edward, DO   10 mg at 09/16/21 2200  ? Or  ? OLANZapine (ZYPREXA) tablet 10 mg  10 mg Oral QHS Sarina IllHerrick, Richard Edward, DO   10 mg at 09/26/21 2128  ? traZODone (DESYREL) tablet 100 mg  100 mg Oral QHS PRN Sarina IllHerrick, Richard Edward, DO      ? ? ?Lab Results: No results found for this or any previous visit (from the past  48 hour(s)). ? ?Blood Alcohol level:  ?Lab Results  ?Component Value Date  ? ETH <10 08/14/2019  ? ? ?Metabolic Disorder Labs: ?Lab Results  ?Component Value Date  ? HGBA1C 5.7 (H) 09/16/2021  ? MPG 117 09/16/2021  ? ?No results found for: PROLACTIN ?Lab Results  ?Component Value Date  ? CHOL 137 09/15/2021  ? TRIG 49 09/15/2021  ? HDL 56 09/15/2021  ? CHOLHDL 2.4 09/15/2021  ? VLDL 10 09/15/2021  ? LDLCALC 71 09/15/2021  ? ? ?Physical Findings: ?AIMS:  , ,  ,  ,    ?CIWA:    ?COWS:    ? ?Musculoskeletal: ?Strength & Muscle Tone: within normal limits ?Gait & Station: normal ?Patient leans: N/A ? ?Psychiatric Specialty Exam: ? ?Presentation  ?General Appearance: Appropriate for Environment ? ?Eye Contact:Good ? ?Speech:Clear and Coherent; Normal Rate ? ?Speech Volume:Normal ? ?Handedness:Right ? ? ?Mood and Affect  ?Mood:Euthymic ? ?Affect:Congruent ? ? ?Thought Process  ?Thought Processes:Coherent; Disorganized ? ?Descriptions of Associations:Loose ? ?Orientation:Partial (self and place) ? ?Thought Content:Delusions ? ?History of Schizophrenia/Schizoaffective disorder:No ? ?Duration of Psychotic Symptoms:N/A ? ?Hallucinations:No data recorded ?Ideas of Reference:Delusions ? ?Suicidal Thoughts:No data recorded ?Homicidal Thoughts:No data recorded ? ?Sensorium  ?Memory:Immediate Poor; Remote Poor; Recent Poor ? ?Judgment:Impaired ? ?Insight:Poor ? ? ?Executive Functions  ?Concentration:Fair ? ?Attention Span:Fair ? ?Recall:Poor ? ?Fund of Knowledge:Fair ? ?Language:Good ? ? ?Psychomotor Activity  ?Psychomotor Activity:No data recorded ? ?Assets  ?Assets:Communication Skills; Desire for Improvement; Housing; Social Support ? ? ?Sleep  ?Sleep:No data recorded ? ? ?Physical Exam: ?Physical Exam ?Vitals and nursing note reviewed.  ?Constitutional:   ?   Appearance: Normal appearance.  ?HENT:  ?   Head: Normocephalic and atraumatic.  ?   Mouth/Throat:  ?   Pharynx: Oropharynx is clear.  ?Eyes:  ?   Pupils: Pupils are  equal, round, and reactive to light.  ?Cardiovascular:  ?   Rate and Rhythm: Normal rate and regular rhythm.  ?Pulmonary:  ?   Effort: Pulmonary effort is normal.  ?   Breath sounds: Normal breath sounds.  ?Abdominal:  ?   General: Abdomen is flat.  ?   Palpations: Abdomen is soft.  ?Musculoskeletal:     ?   General: Normal range of motion.  ?Skin: ?   General: Skin is warm and dry.  ?Neurological:  ?   General: No focal deficit present.  ?   Mental Status: She is alert. Mental status is at baseline.  ?Psychiatric:     ?   Mood and Affect: Mood normal.     ?  Thought Content: Thought content normal.  ? ?Review of Systems  ?Constitutional: Negative.   ?HENT: Negative.    ?Eyes: Negative.   ?Respiratory: Negative.    ?Cardiovascular: Negative.   ?Gastrointestinal: Negative.   ?Musculoskeletal: Negative.   ?Skin: Negative.   ?Neurological: Negative.   ?Psychiatric/Behavioral: Negative.    ?Blood pressure (!) 143/83, pulse 77, temperature 97.8 ?F (36.6 ?C), temperature source Oral, resp. rate 18, height 5\' 2"  (1.575 m), weight 47.2 kg, SpO2 100 %. Body mass index is 19.02 kg/m?. ? ? ?Treatment Plan Summary: ?Medication management and Plan no change to medication management.  Encouraged patient to continue with med compliance and to take care of herself letting staff know if the headache is still present. ? ? , MD ?09/27/2021, 12:22 PM ? ?

## 2021-09-27 NOTE — Plan of Care (Signed)
Patient presents alert and oriented to self. Patient thoughts are delusional and she is paranoid. Patient tells nurse she needs an epi pen but no allergic reaction noted, no s/s of SOB. She c/o headache but states she received Tylenol from the night nurse. Patient denies SI, HI, and denies AVH though observed responding to internal stimuli. Patient denies depression, anxiety. Patient ate breakfast in the day room and spent most of shift in her room. Ongoing Q15 minutes safety check rounds per unit protocol. ? ? ? ?Problem: Education: ?Goal: Knowledge of [facility] Education information/materials will improve ?Outcome: Progressing ?Goal: Emotional status will improve ?Outcome: Progressing ?Goal: Mental status will improve ?Outcome: Progressing ?Goal: Verbalization of understanding the information provided will improve ?Outcome: Progressing ?  ?Problem: Activity: ?Goal: Interest or engagement in activities will improve ?Outcome: Progressing ?Goal: Sleeping patterns will improve ?Outcome: Progressing ?  ?Problem: Coping: ?Goal: Ability to verbalize frustrations and anger appropriately will improve ?Outcome: Progressing ?Goal: Ability to demonstrate self-control will improve ?Outcome: Progressing ?  ?Problem: Health Behavior/Discharge Planning: ?Goal: Identification of resources available to assist in meeting health care needs will improve ?Outcome: Progressing ?Goal: Compliance with treatment plan for underlying cause of condition will improve ?Outcome: Progressing ?  ?Problem: Physical Regulation: ?Goal: Ability to maintain clinical measurements within normal limits will improve ?Outcome: Progressing ?  ?Problem: Safety: ?Goal: Periods of time without injury will increase ?Outcome: Progressing ?  ?

## 2021-09-27 NOTE — Plan of Care (Signed)
Patient Calm and Cooperative during shift.  Patient denies SI/HI/Avh and contracts for safety.  Patient denies Depression and Anxiety.  Patient did comply with scheduled medications mo forced meds needed.  Patient did verbalize a new coping skill "I learned to walk away when others want to argue". Pt did eat night snack and had adequate fluids.  ?Problem: Education: ?Goal: Knowledge of [facility] Education information/materials will improve ?Outcome: Progressing ?Goal: Emotional status will improve ?Outcome: Progressing ?Goal: Mental status will improve ?Outcome: Progressing ? Q 15 minute safety rounding in place.  Will continue plan of care.   ?

## 2021-09-28 NOTE — Plan of Care (Signed)
Patient presents Flat but Bizarre. "I have on a mask form our leader you know my hair, teeth and hair will all come off. This is not my real face". Patient denies SI/HI/Avh and contracts for safety.  Patient observed responding to Auditory Hallucinations.  Encouragement and Support given.  Patient did comply with scheduled medications. Pt denies any adverse reactions form medications given.  Patient did have minimal interactions in therapeutic milieu and ate night snack.  Q 15 minute safety rounding's ?Problem: Education: ?Goal: Knowledge of [facility] Education information/materials will improve ?Outcome: Progressing ?Goal: Verbalization of understanding the information provided will improve ?Outcome: Progressing ?  ?Problem: Education: ?Goal: Emotional status will improve ?Outcome: Not Progressing ?Goal: Mental status will improve ?Outcome: Not Progressing ?  in place.  Plan of care will continue.   ? ?  ?

## 2021-09-28 NOTE — [facility] Group Notes (Signed)
10Am Group : chair yoga  PT  limited in ROM movements, present, calm, composed  ?

## 2021-09-28 NOTE — Group Note (Signed)
LCSW Group Therapy Note ? ? ?Group Date: 09/28/2021 ?Start Time: 1300 ?End Time: 1400 ? ? ?Type of Therapy and Topic:  Group Therapy: Boundaries ? ?Participation Level:  Did Not Attend ? ?Description of Group: ?This group will address the use of boundaries in their personal lives. Patients will explore why boundaries are important, the difference between healthy and unhealthy boundaries, and negative and postive outcomes of different boundaries and will look at how boundaries can be crossed.  Patients will be encouraged to identify current boundaries in their own lives and identify what kind of boundary is being set. Facilitators will guide patients in utilizing problem-solving interventions to address and correct types boundaries being used and to address when no boundary is being used. Understanding and applying boundaries will be explored and addressed for obtaining and maintaining a balanced life. Patients will be encouraged to explore ways to assertively make their boundaries and needs known to significant others in their lives, using other group members and facilitator for role play, support, and feedback. ? ?Therapeutic Goals: ? ?1.  Patient will identify areas in their life where setting clear boundaries could be  used to improve their life.  ?2.  Patient will identify signs/triggers that a boundary is not being respected. ?3.  Patient will identify two ways to set boundaries in order to achieve balance in  their lives: ?4.  Patient will demonstrate ability to communicate their needs and set boundaries  through discussion and/or role plays ? ?Summary of Patient Progress:  Patient did not attend group despite encouraged participation.  ? ?Therapeutic Modalities:   ?Cognitive Behavioral Therapy ?Solution-Focused Therapy ? ?Corky Crafts, LCSWA ?09/28/2021  3:48 PM   ? ?

## 2021-09-28 NOTE — Plan of Care (Signed)
Patient presents alert and oriented to self. Patient thoughts are delusional and she is paranoid. Mood is sullen, affect is flat. Speech is clear, thoughts are delusional. She c/o headache and received PRN Tylenol. Patient denies SI, HI, and denies AVH though observed responding to internal stimuli. Patient denies depression, anxiety. Patient ate breakfast in the day room and spent most of shift in her room. Ongoing Q15 minutes safety check rounds per unit protocol. ? ? ? ?Problem: Education: ?Goal: Knowledge of [facility] [facility] Education information/materials will improve ?Outcome: Progressing ?Goal: Emotional status will improve ?Outcome: Progressing ?Goal: Mental status will improve ?Outcome: Progressing ?Goal: Verbalization of understanding the information provided will improve ?Outcome: Progressing ?  ?Problem: Activity: ?Goal: Interest or engagement in activities will improve ?Outcome: Progressing ?Goal: Sleeping patterns will improve ?Outcome: Progressing ?  ?Problem: Coping: ?Goal: Ability to verbalize frustrations and anger appropriately will improve ?Outcome: Progressing ?Goal: Ability to demonstrate self-control will improve ?Outcome: Progressing ?  ?Problem: Health Behavior/Discharge Planning: ?Goal: Identification of resources available to assist in meeting health care needs will improve ?Outcome: Progressing ?Goal: Compliance with treatment plan for underlying cause of condition will improve ?Outcome: Progressing ?  ?Problem: Physical Regulation: ?Goal: Ability to maintain clinical measurements within normal limits will improve ?Outcome: Progressing ?  ?Problem: Safety: ?Goal: Periods of time without injury will increase ?Outcome: Progressing ?  ?

## 2021-09-28 NOTE — Progress Notes (Signed)
[facility] MD Progress Note ? ?09/28/2021 1:05 PM ?Carolyn Huynh  ?MRN:  [id number] ?Subjective: Follow-up for this woman with schizophrenia.  Patient seen and chart reviewed.  Patient had no new complaints.  Reported that she was feeling reasonably good.  Denied hearing voices.  Denied any major mood symptoms.  Mostly expressed concern about hoping that she could go home soon.  Occasionally still seen to talk to herself but no behavior problems.  Vital signs slightly elevated blood pressure but not enormously slow.  No other physical problems noted. ?Principal Problem: Schizophrenia, paranoid type (HCC) ?Diagnosis: Principal Problem: ?  Schizophrenia, paranoid type (HCC) ?Active Problems: ?  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  Vascular dementia with delusions (HCC) ? ?Total Time spent with patient: 30 minutes ? ?Past Psychiatric History: Past history of schizophrenia ? ?Past Medical History:  ?Past Medical History:  ?Diagnosis Date  ? Asthma   ? COPD (chronic obstructive pulmonary disease) (HCC)   ? Dementia (HCC)   ? Hypertension   ? Pneumothorax   ?  ?Past Surgical History:  ?Procedure Laterality Date  ? CHEST TUBE INSERTION    ? VIDEO ASSISTED THORACOSCOPY Right 02/01/2018  ? Procedure: VIDEO ASSISTED THORACOSCOPY, BLEB STAPLING;  Surgeon: Kerin Perna, MD;  Location: [facility] OR;  Service: Thoracic;  Laterality: Right;  ? ?Family History: History reviewed. No pertinent family history. ?Family Psychiatric  History: See previous ?Social History:  ?Social History  ? ?Substance and Sexual Activity  ?Alcohol Use Not Currently  ?   ?Social History  ? ?Substance and Sexual Activity  ?Drug Use Never  ?  ?Social History  ? ?Socioeconomic History  ? Marital status: Significant Other  ?  Spouse name: Not on file  ? Number of children: Not on file  ? Years of education: Not on file  ? Highest education level: Not on file  ?Occupational History  ? Not on file  ?Tobacco Use  ? Smoking status: Never  ?  Smokeless tobacco: Never  ?Vaping Use  ? Vaping Use: Never used  ?Substance and Sexual Activity  ? Alcohol use: Not Currently  ? Drug use: Never  ? Sexual activity: Not Currently  ?Other Topics Concern  ? Not on file  ?Social History Narrative  ? Not on file  ? ?Social Determinants of Health  ? ?Financial Resource Strain: Not on file  ?Food Insecurity: Not on file  ?Transportation Needs: Not on file  ?Physical Activity: Not on file  ?Stress: Not on file  ?Social Connections: Not on file  ? ?Additional Social History:  ?  ?  ?  ?  ?  ?  ?  ?  ?  ?  ?  ? ?Sleep: Fair ? ?Appetite:  Fair ? ?Current Medications: ?Current Facility-Administered Medications  ?Medication Dose Route Frequency Provider Last Rate Last Admin  ? acetaminophen (TYLENOL) tablet 650 mg  650 mg Oral Q6H PRN Sarina Ill, DO   650 mg at 09/28/21 5993  ? alum & mag hydroxide-simeth (MAALOX/MYLANTA) 200-200-20 MG/5ML suspension 30 mL  30 mL Oral Q4H PRN Sarina Ill, DO      ? amLODipine (NORVASC) tablet 5 mg  5 mg Oral Daily Arlind Klingerman, Jackquline Denmark, MD   5 mg at 09/28/21 5701  ? LORazepam (ATIVAN) tablet 1 mg  1 mg Oral TID PRN Sarina Ill, DO      ? Or  ? LORazepam (ATIVAN) injection 1 mg  1 mg Intramuscular TID PRN Marlou Porch,  Lanny Crampichard Edward, DO      ? magnesium hydroxide (MILK OF MAGNESIA) suspension 30 mL  30 mL Oral Daily PRN Sarina IllHerrick, Richard Edward, DO      ? OLANZapine (ZYPREXA) injection 10 mg  10 mg Intramuscular QHS Sarina IllHerrick, Richard Edward, DO   10 mg at 09/16/21 2200  ? Or  ? OLANZapine (ZYPREXA) tablet 10 mg  10 mg Oral QHS Sarina IllHerrick, Richard Edward, DO   10 mg at 09/27/21 2106  ? traZODone (DESYREL) tablet 100 mg  100 mg Oral QHS PRN Sarina IllHerrick, Richard Edward, DO      ? ? ?Lab Results: No results found for this or any previous visit (from the past 48 hour(s)). ? ?Blood Alcohol level:  ?Lab Results  ?Component Value Date  ? ETH <10 08/14/2019  ? ? ?Metabolic Disorder Labs: ?Lab Results  ?Component Value Date  ? HGBA1C 5.7  (H) 09/16/2021  ? MPG 117 09/16/2021  ? ?No results found for: PROLACTIN ?Lab Results  ?Component Value Date  ? CHOL 137 09/15/2021  ? TRIG 49 09/15/2021  ? HDL 56 09/15/2021  ? CHOLHDL 2.4 09/15/2021  ? VLDL 10 09/15/2021  ? LDLCALC 71 09/15/2021  ? ? ?Physical Findings: ?AIMS:  , ,  ,  ,    ?CIWA:    ?COWS:    ? ?Musculoskeletal: ?Strength & Muscle Tone: within normal limits ?Gait & Station: normal ?Patient leans: N/A ? ?Psychiatric Specialty Exam: ? ?Presentation  ?General Appearance: Appropriate for Environment ? ?Eye Contact:Good ? ?Speech:Clear and Coherent; Normal Rate ? ?Speech Volume:Normal ? ?Handedness:Right ? ? ?Mood and Affect  ?Mood:Euthymic ? ?Affect:Congruent ? ? ?Thought Process  ?Thought Processes:Coherent; Disorganized ? ?Descriptions of Associations:Loose ? ?Orientation:Partial (self and place) ? ?Thought Content:Delusions ? ?History of Schizophrenia/Schizoaffective disorder:No ? ?Duration of Psychotic Symptoms:N/A ? ?Hallucinations:No data recorded ?Ideas of Reference:Delusions ? ?Suicidal Thoughts:No data recorded ?Homicidal Thoughts:No data recorded ? ?Sensorium  ?Memory:Immediate Poor; Remote Poor; Recent Poor ? ?Judgment:Impaired ? ?Insight:Poor ? ? ?Executive Functions  ?Concentration:Fair ? ?Attention Span:Fair ? ?Recall:Poor ? ?Fund of Knowledge:Fair ? ?Language:Good ? ? ?Psychomotor Activity  ?Psychomotor Activity:No data recorded ? ?Assets  ?Assets:Communication Skills; Desire for Improvement; Housing; Social Support ? ? ?Sleep  ?Sleep:No data recorded ? ? ?Physical Exam: ?Physical Exam ?Vitals and nursing note reviewed.  ?Constitutional:   ?   Appearance: Normal appearance.  ?HENT:  ?   Head: Normocephalic and atraumatic.  ?   Mouth/Throat:  ?   Pharynx: Oropharynx is clear.  ?Eyes:  ?   Pupils: Pupils are equal, round, and reactive to light.  ?Cardiovascular:  ?   Rate and Rhythm: Normal rate and regular rhythm.  ?Pulmonary:  ?   Effort: Pulmonary effort is normal.  ?   Breath sounds:  Normal breath sounds.  ?Abdominal:  ?   General: Abdomen is flat.  ?   Palpations: Abdomen is soft.  ?Musculoskeletal:     ?   General: Normal range of motion.  ?Skin: ?   General: Skin is warm and dry.  ?Neurological:  ?   General: No focal deficit present.  ?   Mental Status: She is alert. Mental status is at baseline.  ?Psychiatric:     ?   Attention and Perception: Attention normal.     ?   Mood and Affect: Mood normal.     ?   Speech: Speech normal.     ?   Behavior: Behavior is cooperative.     ?   Thought Content: Thought  content normal.     ?   Cognition and Memory: Cognition is impaired.  ? ?Review of Systems  ?Constitutional: Negative.   ?HENT: Negative.    ?Eyes: Negative.   ?Respiratory: Negative.    ?Cardiovascular: Negative.   ?Gastrointestinal: Negative.   ?Musculoskeletal: Negative.   ?Skin: Negative.   ?Neurological: Negative.   ?Psychiatric/Behavioral: Negative.    ?Blood pressure (!) 149/81, pulse 67, temperature (!) 97.4 ?F (36.3 ?C), temperature source Oral, resp. rate 20, height 5\' 2"  (1.575 m), weight 47.2 kg, SpO2 99 %. Body mass index is 19.02 kg/m?. ? ? ?Treatment Plan Summary: ?Medication management and Plan no change to medication management.  Supportive therapy.  Treatment team will continue to work on possible discharge options. ? ? , MD ?09/28/2021, 1:05 PM ? ?

## 2021-09-29 MED ORDER — HYDROCHLOROTHIAZIDE 12.5 MG PO CAPS: 12.5000 mg | ORAL_CAPSULE | Freq: Every day | ORAL | 3 refills | Status: DC

## 2021-09-29 MED ORDER — TRAZODONE HCL 100 MG PO TABS: 100.0000 mg | ORAL_TABLET | Freq: Every evening | ORAL | 3 refills | Status: DC | PRN

## 2021-09-29 MED ORDER — AMLODIPINE BESYLATE 5 MG PO TABS: 5.0000 mg | ORAL_TABLET | Freq: Every day | ORAL | 3 refills | Status: DC

## 2021-09-29 MED ORDER — OLANZAPINE 10 MG PO TABS: 10.0000 mg | ORAL_TABLET | Freq: Every day | ORAL | 3 refills | Status: DC

## 2021-09-29 NOTE — Progress Notes (Signed)
?  [facility] Adult Case Management Discharge Plan : ? ?Will you be returning to the same living situation after discharge:  Yes,  pt will be returning home with her daughter ?At discharge, do you have transportation home?: Yes,  CSW will assist pt with obtaining transportation ?Do you have the ability to pay for your medications: Yes,  Pt has Medicaid ? ?Release of information consent forms completed and in the chart;  Patient's signature needed at discharge. ? ?Patient to Follow up at: ? Follow-up Information   ? ? [facility] Follow up.   ?Specialty: Urgent Care ?Why: Though you declined follow up scheduling, you can attend walk in hours at 7:30am-11am on Monday, Wednesday-Friday to schedule your medication managment appointment. Please attend walk-in hours Monday and Wednesday at 7:30am-11am to schedule your follow up appointment for therapy. Thank you! ?Contact information: ?[address] ?[place] [address] ?[phone] ? ?  ?  ? ?  ?  ? ?  ? ? ?Next level of care provider has access to [facility] Link:yes ? ?Safety Planning and Suicide Prevention discussed: Yes,  SPE completed with pt's daughter  ? ?  ? ?Has patient been referred to the Quitline?: N/A patient is not a smoker ? ?Patient has been referred for addiction treatment: N/A ? ?Carolyn Huynh, LCSWA ?09/29/2021, 1:13 PM ?

## 2021-09-29 NOTE — Care Management Important Message (Signed)
Important Message ? ?Patient Details  ?Name: Carolyn Huynh ?MRN: [id number] ?Date of Birth: [birth year] ? ? ?Medicare Important Message Given:  Other (see comment) (Patient provided verbal message of IM Medicare notice. Patient declined to sign at discharge and stated "she knew all about that") ? ? ? ? ?Aquan Kope A [place], LCSWA ?09/29/2021, 3:34 PM ?

## 2021-09-29 NOTE — Progress Notes (Signed)
Recreation Therapy Notes ? ?INPATIENT RECREATION TR PLAN ? ?Patient Details ?Name: Carolyn Huynh ?MRN: [id number] ?DOB: 05/26/1956 ?Today's Date: 09/29/2021 ? ?Rec Therapy Plan ?Is patient appropriate for Therapeutic Recreation?: Yes ?Treatment times per week: at least 3 ?Estimated Length of Stay: 5-7 days ?TR Treatment/Interventions: Group participation (Comment) ? ?Discharge Criteria ?Pt will be discharged from therapy if:: Discharged ?Treatment plan/goals/alternatives discussed and agreed upon by:: Patient/family ? ?Discharge Summary ?Short term goals set: Patient will engage in groups without prompting or encouragement from LRT x3 group sessions within 5 recreation therapy group sessions ?Short term goals met: Complete ?Progress toward goals comments: Groups attended ?Which groups?: Goal setting, Leisure education, Social skills, Communication, Other (Comment) ?Reason goals not met: N/A ?Therapeutic equipment acquired: N/A ?Reason patient discharged from therapy: Discharge from hospital ?Pt/family agrees with progress & goals achieved: Yes ?Date patient discharged from therapy: 09/29/21 ? ? ?   ?09/29/2021, 3:03 PM ?

## 2021-09-29 NOTE — Progress Notes (Signed)
Patient alert and oriented x 2. Affect is pleasant and calm. Denies anxiety, SI/HI or AVH.  Patient states they will try to keep themselves safe when they return home. ° °Reviewed discharge instructions with patient including follow up appointment with provider, medication and prescriptions. Questions answered and understanding verbalized.  Discharge packet given. All belongings returned to patient after verification completed by staff.   ° °Patient escorted by staff off unit at this time stable without complaint. °

## 2021-09-29 NOTE — Progress Notes (Signed)
Recreation Therapy Notes ? ?Date: 09/29/2021 ?  ?Time: 1:30 pm  ?  ?Location: Craft room    ?  ?Behavioral response: N/A ?  ?Intervention Topic: Time Management     ?  ?Discussion/Intervention: ?Patient refused to attend group.  ?  ?Clinical Observations/Feedback:  ?Patient refused to attend group.  ?  ?Shari Natt LRT/CTRS ? ? ? ? ? ? ? ?Cailen Mihalik ?09/29/2021 1:35 PM ?

## 2021-09-29 NOTE — BHH Counselor (Signed)
CSW contacted pt's daughter, Selin Eisler to discuss discharge transportation confirmation today at 5pm. There was no answer, CSW left voicemail with contact information. CSW will follow up at another time.  ? ?Ilee Randleman [place], MSW, LCSW-A ?4/17/20231:11 PM  ?

## 2021-09-29 NOTE — Plan of Care (Signed)
?  Problem: Group Participation ?Goal: STG - Patient will engage in groups without prompting or encouragement from LRT x3 group sessions within 5 recreation therapy group sessions ?Description: STG - Patient will engage in groups without prompting or encouragement from LRT x3 group sessions within 5 recreation therapy group sessions ?Outcome: Completed/Met ?Goal: STG - Patient will engage in groups with a calm and appropriate mood at least 2x within 5 recreation therapy group sessions ?Description: STG - Patient will engage in groups with a calm and appropriate mood at least 2x within 5 recreation therapy group sessions ?Outcome: Completed/Met ?Goal: STG - Patient will engage in interactions with peers and staff in pro-social manner at least 2x within 5 recreation therapy group sessions ?Description: STG - Patient will engage in interactions with peers and staff in pro-social manner at least 2x within 5 recreation therapy group sessions ?Outcome: Completed/Met ?Goal: STG - Patient will focus on task/topic with 2 prompts from staff within 5 recreation therapy group sessions ?Description: STG - Patient will focus on task/topic with 2 prompts from staff within 5 recreation therapy group sessions ?Outcome: Completed/Met ?Goal: STG - Other Group Participation Goal (Specify) ?Description: STG - Other Group Participation Goal (Specify) ?Outcome: Completed/Met ?  ?Problem: Depression ?Goal: STG - Patient will identify 3 triggers for depression within 5 recreation therapy group sessions ?Description: STG - Patient will identify 3 triggers for depression within 5 recreation therapy group sessions ?Outcome: Completed/Met ?Goal: STG - Patient will identify 3 positive coping skills to decrease depressive symptoms within 5 recreation therapy group sessions ?Description: STG - Patient will identify 3 positive coping skills to decrease depressive symptoms within 5 recreation therapy group sessions ?Outcome: Completed/Met ?Goal: STG  - Patient will identify 3 healthy boundaries to utilize post d/c within 5 recreation therapy group sessions ?Description: STG - Patient will identify 3 healthy boundaries to utilize post d/c within 5 recreation therapy group sessions ?Outcome: Completed/Met ?Goal: STG - Other Depression Goal (Specify) ?Description: STG - Other Depression Goal (Specify) ?Outcome: Completed/Met ?  ?
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
## 2021-09-29 NOTE — BHH Counselor (Signed)
CSW received phone call from pt's daughter, Carolyn Huynh, [phone] who is providing pt's transportation.  ? ?She informed CSW that her car is not working due to a battery issue and that she may not be able to provide transportation to pt today if her car doesn't get fixed.  ? ?CSW will contact Yulonda back momentarily to discuss additional transportation options and find out if car has been fixed.  ? ?Willliam Pettet [place], MSW, LCSW-A ?4/17/20231:26 PM  ?

## 2021-09-29 NOTE — BHH Suicide Risk Assessment (Signed)
[facility] Discharge Suicide Risk Assessment ? ? ?Principal Problem: Schizophrenia, paranoid type (HCC) ?Discharge Diagnoses: Principal Problem: ?  Schizophrenia, paranoid type (HCC) ?Active Problems: ?  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  Vascular dementia with delusions (HCC) ? ? ?Total Time spent with patient: 1 hour ? ?Musculoskeletal: ?Strength & Muscle Tone: within normal limits ?Gait & Station: normal ?Patient leans: N/A ? ?Psychiatric Specialty Exam ? ?Presentation  ?General Appearance: Appropriate for Environment ? ?Eye Contact:Good ? ?Speech:Clear and Coherent; Normal Rate ? ?Speech Volume:Normal ? ?Handedness:Right ? ? ?Mood and Affect  ?Mood:Euthymic ? ?Duration of Depression Symptoms: No data recorded ?Affect:Congruent ? ? ?Thought Process  ?Thought Processes:Coherent; Disorganized ? ?Descriptions of Associations:Loose ? ?Orientation:Partial (self and place) ? ?Thought Content:Delusions ? ?History of Schizophrenia/Schizoaffective disorder:No ? ?Duration of Psychotic Symptoms:N/A ? ?Hallucinations:No data recorded ?Ideas of Reference:Delusions ? ?Suicidal Thoughts:No data recorded ?Homicidal Thoughts:No data recorded ? ?Sensorium  ?Memory:Immediate Poor; Remote Poor; Recent Poor ? ?Judgment:Impaired ? ?Insight:Poor ? ? ?Executive Functions  ?Concentration:Fair ? ?Attention Span:Fair ? ?Recall:Poor ? ?Fund of Knowledge:Fair ? ?Language:Good ? ? ?Psychomotor Activity  ?Psychomotor Activity:No data recorded ? ?Assets  ?Assets:Communication Skills; Desire for Improvement; Housing; Social Support ? ? ?Sleep  ?Sleep:No data recorded ? ?Physical Exam: ?Physical Exam ?ROS ?Blood pressure 138/74, pulse 78, temperature 98 ?F (36.7 ?C), temperature source Oral, resp. rate 18, height 5\' 2"  (1.575 m), weight 47.2 kg, SpO2 100 %. Body mass index is 19.02 kg/m?. ? ?Mental Status Per Nursing Assessment::   ?On Admission:  NA ? ?Demographic Factors:  ?Age 68 or older ? ?Loss  Factors: ?NA ? ?Historical Factors: ?NA ? ?Risk Reduction Factors:   ?NA ? ?Continued Clinical Symptoms:  ?Schizophrenia:   Command hallucinatons ? ?Cognitive Features That Contribute To Risk:  ?Loss of executive function   ? ?Suicide Risk:  ?Minimal: No identifiable suicidal ideation.  Patients presenting with no risk factors but with morbid ruminations; may be classified as minimal risk based on the severity of the depressive symptoms ? ? Follow-up Information   ? ? [facility] Follow up.   ?Specialty: Urgent Care ?Why: Though you declined follow up scheduling, you can attend walk in hours at 7:30am-11am on Monday, Wednesday-Friday to schedule your medication managment appointment. Please attend walk-in hours Monday and Wednesday at 7:30am-11am to schedule your follow up appointment for therapy. Thank you! ?Contact information: ?[address] ?[place] ?[phone] ? ?  ?  ? ?  ?  ? ?  ? ? ?Plan Of Care/Follow-up recommendations: As above ? ? ?[phone], DO ?09/29/2021, 10:51 AM ?

## 2021-09-29 NOTE — Plan of Care (Signed)
?  Problem: Education: ?Goal: Knowledge of [place] [facility] Education information/materials will improve ?09/29/2021 1527 by Doneen Poisson, RN ?Outcome: Adequate for Discharge ?09/29/2021 1130 by Doneen Poisson, RN ?Outcome: Progressing ?Goal: Emotional status will improve ?09/29/2021 1527 by Doneen Poisson, RN ?Outcome: Adequate for Discharge ?09/29/2021 1130 by Doneen Poisson, RN ?Outcome: Progressing ?Goal: Mental status will improve ?09/29/2021 1527 by Doneen Poisson, RN ?Outcome: Adequate for Discharge ?09/29/2021 1130 by Doneen Poisson, RN ?Outcome: Progressing ?Goal: Verbalization of understanding the information provided will improve ?09/29/2021 1527 by Doneen Poisson, RN ?Outcome: Adequate for Discharge ?09/29/2021 1130 by Doneen Poisson, RN ?Outcome: Progressing ?  ?Problem: Activity: ?Goal: Interest or engagement in activities will improve ?09/29/2021 1527 by Doneen Poisson, RN ?Outcome: Adequate for Discharge ?09/29/2021 1130 by Doneen Poisson, RN ?Outcome: Progressing ?Goal: Sleeping patterns will improve ?09/29/2021 1527 by Doneen Poisson, RN ?Outcome: Adequate for Discharge ?09/29/2021 1130 by Doneen Poisson, RN ?Outcome: Progressing ?  ?Problem: Coping: ?Goal: Ability to verbalize frustrations and anger appropriately will improve ?09/29/2021 1527 by Doneen Poisson, RN ?Outcome: Adequate for Discharge ?09/29/2021 1130 by Doneen Poisson, RN ?Outcome: Progressing ?Goal: Ability to demonstrate self-control will improve ?09/29/2021 1527 by Doneen Poisson, RN ?Outcome: Adequate for Discharge ?09/29/2021 1130 by Doneen Poisson, RN ?Outcome: Progressing ?  ?Problem: Physical Regulation: ?Goal: Ability to maintain clinical measurements within normal limits will improve ?09/29/2021 1527 by Doneen Poisson, RN ?Outcome: Adequate for Discharge ?09/29/2021 1130 by Doneen Poisson, RN ?Outcome: Progressing ?  ?Problem: Health Behavior/Discharge  Planning: ?Goal: Identification of resources available to assist in meeting health care needs will improve ?09/29/2021 1527 by Doneen Poisson, RN ?Outcome: Adequate for Discharge ?09/29/2021 1130 by Doneen Poisson, RN ?Outcome: Progressing ?Goal: Compliance with treatment plan for underlying cause of condition will improve ?09/29/2021 1527 by Doneen Poisson, RN ?Outcome: Adequate for Discharge ?09/29/2021 1130 by Doneen Poisson, RN ?Outcome: Progressing ?  ?Problem: Safety: ?Goal: Periods of time without injury will increase ?09/29/2021 1527 by Doneen Poisson, RN ?Outcome: Adequate for Discharge ?09/29/2021 1130 by Doneen Poisson, RN ?Outcome: Progressing ?  ?

## 2021-09-29 NOTE — Plan of Care (Signed)
Patient presents alert and oriented to self. Patient thoughts are delusional and she is paranoid. Mood is sullen, affect is flat. Speech is clear, thoughts are delusional. She denies pain at this time. Patient denies SI, HI, and denies AVH though observed responding to internal stimuli. Patient denies depression, anxiety. Patient ate breakfast in the day room and spent most of shift in her room. Ongoing Q15 minutes safety check rounds per unit protocol. ? ? ? ?Problem: Education: ?Goal: Knowledge of [place] [facility] Education information/materials will improve ?Outcome: Progressing ?Goal: Emotional status will improve ?Outcome: Progressing ?Goal: Mental status will improve ?Outcome: Progressing ?Goal: Verbalization of understanding the information provided will improve ?Outcome: Progressing ?  ?Problem: Activity: ?Goal: Interest or engagement in activities will improve ?Outcome: Progressing ?Goal: Sleeping patterns will improve ?Outcome: Progressing ?  ?Problem: Coping: ?Goal: Ability to verbalize frustrations and anger appropriately will improve ?Outcome: Progressing ?Goal: Ability to demonstrate self-control will improve ?Outcome: Progressing ?  ?Problem: Health Behavior/Discharge Planning: ?Goal: Identification of resources available to assist in meeting health care needs will improve ?Outcome: Progressing ?Goal: Compliance with treatment plan for underlying cause of condition will improve ?Outcome: Progressing ?  ?Problem: Physical Regulation: ?Goal: Ability to maintain clinical measurements within normal limits will improve ?Outcome: Progressing ?  ?Problem: Safety: ?Goal: Periods of time without injury will increase ?Outcome: Progressing ?  ?
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
## 2021-09-29 NOTE — BHH Counselor (Signed)
CSW received phone call from pt's daughter, Jelesia Goddu, [phone] stating that she and her other sister will be arriving around 5pm to provide transportation for pt discharge. CSW provided [place] with nurses station phone number as well if needed.  ? ?Shondra Capps [place], MSW, LCSW-A ?4/17/20231:18 PM  ?

## 2022-03-10 ENCOUNTER — Emergency Department (HOSPITAL_BASED_OUTPATIENT_CLINIC_OR_DEPARTMENT_OTHER): Payer: Self-pay

## 2022-03-10 ENCOUNTER — Emergency Department (HOSPITAL_BASED_OUTPATIENT_CLINIC_OR_DEPARTMENT_OTHER)
Admission: EM | Admit: 2022-03-10 | Discharge: 2022-03-10 | Disposition: A | Discharge status: Home / Self Care | Payer: Self-pay | Attending: Emergency Medicine | Admitting: Emergency Medicine

## 2022-03-10 ENCOUNTER — Encounter (HOSPITAL_BASED_OUTPATIENT_CLINIC_OR_DEPARTMENT_OTHER): Payer: Self-pay

## 2022-03-10 DIAGNOSIS — I10 Essential (primary) hypertension: Secondary | ICD-10-CM | POA: Insufficient documentation

## 2022-03-10 DIAGNOSIS — F0390 Unspecified dementia without behavioral disturbance: Secondary | ICD-10-CM | POA: Insufficient documentation

## 2022-03-10 DIAGNOSIS — Z79899 Other long term (current) drug therapy: Secondary | ICD-10-CM | POA: Insufficient documentation

## 2022-03-10 DIAGNOSIS — R55 Syncope and collapse: Secondary | ICD-10-CM | POA: Insufficient documentation

## 2022-03-10 DIAGNOSIS — J449 Chronic obstructive pulmonary disease, unspecified: Secondary | ICD-10-CM | POA: Insufficient documentation

## 2022-03-10 DIAGNOSIS — J45909 Unspecified asthma, uncomplicated: Secondary | ICD-10-CM | POA: Insufficient documentation

## 2022-03-10 LAB — URINALYSIS, ROUTINE W REFLEX MICROSCOPIC
Bilirubin Urine: NEGATIVE
Glucose, UA: NEGATIVE mg/dL
Ketones, ur: NEGATIVE mg/dL
Leukocytes,Ua: NEGATIVE
Nitrite: NEGATIVE
Specific Gravity, Urine: 1.016 (ref 1.005–1.030)
pH: 6.5 (ref 5.0–8.0)

## 2022-03-10 LAB — CBC WITH DIFFERENTIAL/PLATELET
Abs Immature Granulocytes: 0.04 10*3/uL (ref 0.00–0.07)
Basophils Absolute: 0.1 10*3/uL (ref 0.0–0.1)
Basophils Relative: 0 %
Eosinophils Absolute: 0.2 10*3/uL (ref 0.0–0.5)
Eosinophils Relative: 1 %
HCT: 39 % (ref 36.0–46.0)
Hemoglobin: 12.7 g/dL (ref 12.0–15.0)
Immature Granulocytes: 0 %
Lymphocytes Relative: 8 %
Lymphs Abs: 1.1 10*3/uL (ref 0.7–4.0)
MCH: 30.1 pg (ref 26.0–34.0)
MCHC: 32.6 g/dL (ref 30.0–36.0)
MCV: 92.4 fL (ref 80.0–100.0)
Monocytes Absolute: 1.1 10*3/uL — ABNORMAL HIGH (ref 0.1–1.0)
Monocytes Relative: 8 %
Neutro Abs: 10.9 10*3/uL — ABNORMAL HIGH (ref 1.7–7.7)
Neutrophils Relative %: 83 %
Platelets: 220 10*3/uL (ref 150–400)
RBC: 4.22 MIL/uL (ref 3.87–5.11)
RDW: 13.2 % (ref 11.5–15.5)
WBC: 13.2 10*3/uL — ABNORMAL HIGH (ref 4.0–10.5)
nRBC: 0 % (ref 0.0–0.2)

## 2022-03-10 LAB — BASIC METABOLIC PANEL
Anion gap: 9 (ref 5–15)
BUN: 14 mg/dL (ref 8–23)
CO2: 26 mmol/L (ref 22–32)
Calcium: 9 mg/dL (ref 8.9–10.3)
Chloride: 102 mmol/L (ref 98–111)
Creatinine, Ser: 0.91 mg/dL (ref 0.44–1.00)
GFR, Estimated: >60 mL/min (ref >60)
Glucose, Bld: 102 mg/dL — ABNORMAL HIGH (ref 70–99)
Potassium: 3.4 mmol/L — ABNORMAL LOW (ref 3.5–5.1)
Sodium: 137 mmol/L (ref 135–145)

## 2022-03-10 MED ORDER — SODIUM CHLORIDE 0.9 % IV BOLUS
1000.0000 mL | Freq: Once | INTRAVENOUS | Status: DC
Start: 2022-03-10 — End: 2022-03-10

## 2022-03-10 NOTE — ED Provider Notes (Signed)
[facility] Provider Note: Georgena Spurling, MD, FACEP  CSN: [id number] MRN: [id number] ARRIVAL: 03/10/22 at [place]: [place]  Fall  Level 5 caveat: Dementia HISTORY OF PRESENT ILLNESS  03/10/22 3:54 AM Carolyn Huynh is a 66 y.o. female with a history of dementia who lives with her daughter.  She told her daughter she was hot and sweaty and her daughter turned on the air conditioner.  Her daughter subsequently found her on the floor having possibly passed out.  She seemed to be somewhat confused briefly before coming around to her baseline.  There was no obvious injury but the patient is saying she is having low back pain and some suprapubic pain.    The patient, due to her dementia, confabulates.  For example, she tells that she is in her fifth trimester of pregnancy.  She tells that she was elected president of the [place].   Past Medical History:  Diagnosis Date   Asthma    COPD (chronic obstructive pulmonary disease) ([place])    Dementia (HCC)    Hypertension    Pneumothorax     Past Surgical History:  Procedure Laterality Date   CHEST TUBE INSERTION     VIDEO ASSISTED THORACOSCOPY Right 02/01/2018   Procedure: VIDEO ASSISTED THORACOSCOPY, BLEB STAPLING;  Surgeon: Ivin Poot, MD;  Location: [facility];  Service: Thoracic;  Laterality: Right;    No family history on file.  Social History   Tobacco Use   Smoking status: Never   Smokeless tobacco: Never  Vaping Use   Vaping Use: Never used  Substance Use Topics   Alcohol use: Not Currently   Drug use: Never    Prior to Admission medications   Medication Sig Start Date End Date Taking? Authorizing Provider  amLODipine (NORVASC) 5 MG tablet Take 1 tablet (5 mg total) by mouth daily. 09/30/21   Parks Ranger, DO  hydrochlorothiazide (MICROZIDE) 12.5 MG capsule Take 1 capsule (12.5 mg total) by mouth daily. 09/29/21   Parks Ranger, DO  OLANZapine (ZYPREXA) 10 MG  tablet Take 1 tablet (10 mg total) by mouth at bedtime. 09/29/21   Parks Ranger, DO  traZODone (DESYREL) 100 MG tablet Take 1 tablet (100 mg total) by mouth at bedtime as needed for sleep. 09/29/21   Parks Ranger, DO    Allergies Aspirin, Shellfish allergy, and Ivp dye [iodinated contrast media]   REVIEW OF SYSTEMS  Level 5 caveat   PHYSICAL EXAMINATION  Initial Vital Signs Blood pressure (!) 166/80, pulse (!) 58, resp. rate 20, SpO2 96 %.  Examination General: Well-developed, cachectic female in no acute distress; appearance consistent with age of record HENT: normocephalic; atraumatic Eyes: pupils equal, round and reactive to light; extraocular muscles grossly intact; arcus senilis bilaterally Neck: supple; nontender Heart: regular rate and rhythm Lungs: clear to auscultation bilaterally Abdomen: soft; nondistended; mild suprapubic tenderness; bowel sounds present GU: Chronic appearing inflammatory changes of skin of groin folds Back: Mild lumbar tenderness Extremities: No deformity; full range of motion; pulses normal Neurologic: Awake, alert; motor function intact in all extremities and symmetric; no facial droop Skin: Warm and dry Psychiatric: Normal mood and affect; confabulation   RESULTS  Summary of this visit's results, reviewed and interpreted by myself:   EKG Interpretation  Date/Time:  Tuesday March 10 2022 04:04:32 EDT Ventricular Rate:  64 PR Interval:  144 QRS Duration: 85 QT Interval:  411 QTC Calculation: 424 R Axis:   -13  Text Interpretation: Sinus rhythm Anteroseptal infarct, age indeterminate No significant change was found Confirmed by Paula Libra ([id number]) on 03/10/2022 4:48:45 AM       Laboratory Studies: Results for orders placed or performed during the hospital encounter of 03/10/22 (from the past 24 hour(s))  Urinalysis, Routine w reflex microscopic     Status: Abnormal   Collection Time: 03/10/22  4:15 AM  Result  Value Ref Range   Color, Urine YELLOW YELLOW   APPearance CLEAR CLEAR   Specific Gravity, Urine 1.016 1.005 - 1.030   pH 6.5 5.0 - 8.0   Glucose, UA NEGATIVE NEGATIVE mg/dL   Hgb urine dipstick TRACE (A) NEGATIVE   Bilirubin Urine NEGATIVE NEGATIVE   Ketones, ur NEGATIVE NEGATIVE mg/dL   Protein, ur TRACE (A) NEGATIVE mg/dL   Nitrite NEGATIVE NEGATIVE   Leukocytes,Ua NEGATIVE NEGATIVE   RBC / HPF 0-5 0 - 5 RBC/hpf   WBC, UA 0-5 0 - 5 WBC/hpf   Bacteria, UA RARE (A) NONE SEEN   Squamous Epithelial / LPF 0-5 0 - 5   Mucus PRESENT   CBC with Differential     Status: Abnormal   Collection Time: 03/10/22  4:17 AM  Result Value Ref Range   WBC 13.2 (H) 4.0 - 10.5 K/uL   RBC 4.22 3.87 - 5.11 MIL/uL   Hemoglobin 12.7 12.0 - 15.0 g/dL   HCT 60.4 54.0 - 98.1 %   MCV 92.4 80.0 - 100.0 fL   MCH 30.1 26.0 - 34.0 pg   MCHC 32.6 30.0 - 36.0 g/dL   RDW 19.1 47.8 - 29.5 %   Platelets 220 150 - 400 K/uL   nRBC 0.0 0.0 - 0.2 %   Neutrophils Relative % 83 %   Neutro Abs 10.9 (H) 1.7 - 7.7 K/uL   Lymphocytes Relative 8 %   Lymphs Abs 1.1 0.7 - 4.0 K/uL   Monocytes Relative 8 %   Monocytes Absolute 1.1 (H) 0.1 - 1.0 K/uL   Eosinophils Relative 1 %   Eosinophils Absolute 0.2 0.0 - 0.5 K/uL   Basophils Relative 0 %   Basophils Absolute 0.1 0.0 - 0.1 K/uL   Immature Granulocytes 0 %   Abs Immature Granulocytes 0.04 0.00 - 0.07 K/uL  Basic metabolic panel     Status: Abnormal   Collection Time: 03/10/22  4:17 AM  Result Value Ref Range   Sodium 137 135 - 145 mmol/L   Potassium 3.4 (L) 3.5 - 5.1 mmol/L   Chloride 102 98 - 111 mmol/L   CO2 26 22 - 32 mmol/L   Glucose, Bld 102 (H) 70 - 99 mg/dL   BUN 14 8 - 23 mg/dL   Creatinine, Ser 6.21 0.44 - 1.00 mg/dL   Calcium 9.0 8.9 - 30.8 mg/dL   GFR, Estimated >65 >78 mL/min   Anion gap 9 5 - 15   Imaging Studies: CT Lumbar Spine Wo Contrast  Result Date: 03/10/2022 CLINICAL DATA:  66 year old female status post fall in bathroom. Pain. EXAM:  CT LUMBAR SPINE WITHOUT CONTRAST TECHNIQUE: Multidetector CT imaging of the lumbar spine was performed without intravenous contrast administration. Multiplanar CT image reconstructions were also generated. RADIATION DOSE REDUCTION: This exam was performed according to the departmental dose-optimization program which includes automated exposure control, adjustment of the mA and/or kV according to patient size and/or use of iterative reconstruction technique. COMPARISON:  CT Abdomen and Pelvis 08/25/2018. FINDINGS: Segmentation: Normal. Alignment: Stable lumbar lordosis since 2020. Mild grade 1 anterolisthesis at both  L4-L5 and L5-S1. Vertebrae: Chronic right side L1 vertebral body 12 mm sclerotic area is stable since 2020 and appears benign. Background bone mineralization is within normal limits for age. Chronic ankylosis of both anterior superior SI joints. Visible sacrum appears intact. No acute osseous abnormality identified. Paraspinal and other soft tissues: Respiratory motion at the lung bases. Aortoiliac calcified atherosclerosis. Otherwise negative visible noncontrast abdominal viscera. Lumbar paraspinal soft tissues are within normal limits. Disc levels: Capacious spinal canal. Lower thoracic interbody ankylosis at T11-T12, and chronic endplate spurring at T12-L1. Mild for age disc and endplate degeneration in the lumbar spine. However, there is widespread severe lumbar spine posterior element degeneration including bulky interspinous process osteophytosis (Baastrup's disease) and moderate to severe bilateral facet arthropathy at L4-L5 and L5-S1. Furthermore, there is chronic bilateral L5-S1 facet ankylosis. No lumbar spinal stenosis. IMPRESSION: 1. No acute osseous abnormality identified. 2. Capacious spinal canal and no lumbar spinal stenosis, but advanced lumbar posterior element degeneration, including facet arthropathy at L4-L5, and facet ankylosis at L5-S1. 3. Aortic Atherosclerosis (ICD10-I70.0).  Electronically Signed   By: Odessa Fleming M.D.   On: 03/10/2022 04:51    ED COURSE and MDM  Nursing notes, initial and subsequent vitals signs, including pulse oximetry, reviewed and interpreted by myself.  Vitals:   03/10/22 0351 03/10/22 0356 03/10/22 0430  BP: (!) 166/80  (!) 117/58  Pulse: (!) 58  64  Resp: 20  (!) 23  Temp:  98.6 F (37 C)   TempSrc:  Oral   SpO2: 96%  97%   Medications - No data to display  5:10 AM No evidence of acute lumbar spine injury.  No evidence of urinary tract infection.  Urinalysis is not consistent with dehydration.  The cause of her syncopal episode is unclear.  She has not had an arrhythmia on her EKG or on the monitor.  She was complaining of feeling hot and sweaty so she may have had a vagal episode.  Her daughter states she does not eat or drink very much and she is likely malnourished.  She is not hypoglycemic here.   PROCEDURES  Procedures   ED DIAGNOSES     ICD-10-CM   1. Vasovagal syncope  R55          Devine Klingel, Jonny Ruiz, MD 03/10/22 [phone]

## 2022-03-10 NOTE — ED Triage Notes (Signed)
Pt comes via [facility] after a fall in the bathroom, got up to have a BM, became diaphoretic and then had near syncopal episode, pt had fall, now having back pain and lower abd pain. Pt has dementia.

## 2022-03-10 NOTE — ED Notes (Signed)
Pt verbalizes understanding of discharge instructions. Opportunity for questioning and answers were provided. Pt discharged from ED to home with daughter.    

## 2022-03-10 NOTE — ED Notes (Signed)
Patient transported to CT 

## 2023-12-11 ENCOUNTER — Emergency Department (HOSPITAL_COMMUNITY): Payer: Self-pay

## 2023-12-11 ENCOUNTER — Encounter (HOSPITAL_COMMUNITY): Payer: Self-pay | Admitting: *Deleted

## 2023-12-11 ENCOUNTER — Other Ambulatory Visit: Payer: Self-pay

## 2023-12-11 ENCOUNTER — Inpatient Hospital Stay (HOSPITAL_COMMUNITY)
Admission: EM | Admit: 2023-12-11 | Discharge: 2023-12-17 | DRG: 392 | Disposition: A | Discharge status: Home Health | Payer: MEDICAID | Attending: Internal Medicine | Admitting: Internal Medicine

## 2023-12-11 DIAGNOSIS — I129 Hypertensive chronic kidney disease with stage 1 through stage 4 chronic kidney disease, or unspecified chronic kidney disease: Secondary | ICD-10-CM | POA: Diagnosis present

## 2023-12-11 DIAGNOSIS — E8809 Other disorders of plasma-protein metabolism, not elsewhere classified: Secondary | ICD-10-CM | POA: Diagnosis not present

## 2023-12-11 DIAGNOSIS — D631 Anemia in chronic kidney disease: Secondary | ICD-10-CM | POA: Diagnosis present

## 2023-12-11 DIAGNOSIS — R17 Unspecified jaundice: Secondary | ICD-10-CM | POA: Diagnosis not present

## 2023-12-11 DIAGNOSIS — F0150 Vascular dementia without behavioral disturbance: Secondary | ICD-10-CM | POA: Diagnosis present

## 2023-12-11 DIAGNOSIS — E440 Moderate protein-calorie malnutrition: Secondary | ICD-10-CM | POA: Diagnosis present

## 2023-12-11 DIAGNOSIS — J4489 Other specified chronic obstructive pulmonary disease: Secondary | ICD-10-CM | POA: Diagnosis present

## 2023-12-11 DIAGNOSIS — F200 Paranoid schizophrenia: Secondary | ICD-10-CM | POA: Diagnosis present

## 2023-12-11 DIAGNOSIS — G309 Alzheimer's disease, unspecified: Secondary | ICD-10-CM | POA: Diagnosis present

## 2023-12-11 DIAGNOSIS — K5792 Diverticulitis of intestine, part unspecified, without perforation or abscess without bleeding: Secondary | ICD-10-CM | POA: Diagnosis present

## 2023-12-11 DIAGNOSIS — L03317 Cellulitis of buttock: Secondary | ICD-10-CM | POA: Diagnosis present

## 2023-12-11 DIAGNOSIS — Z91041 Radiographic dye allergy status: Secondary | ICD-10-CM

## 2023-12-11 DIAGNOSIS — R296 Repeated falls: Secondary | ICD-10-CM | POA: Diagnosis present

## 2023-12-11 DIAGNOSIS — E876 Hypokalemia: Secondary | ICD-10-CM | POA: Diagnosis not present

## 2023-12-11 DIAGNOSIS — Z681 Body mass index (BMI) 19 or less, adult: Secondary | ICD-10-CM

## 2023-12-11 DIAGNOSIS — W19XXXA Unspecified fall, initial encounter: Principal | ICD-10-CM

## 2023-12-11 DIAGNOSIS — N179 Acute kidney failure, unspecified: Secondary | ICD-10-CM | POA: Diagnosis not present

## 2023-12-11 DIAGNOSIS — N1831 Chronic kidney disease, stage 3a: Secondary | ICD-10-CM | POA: Diagnosis present

## 2023-12-11 DIAGNOSIS — Z886 Allergy status to analgesic agent status: Secondary | ICD-10-CM

## 2023-12-11 DIAGNOSIS — E872 Acidosis, unspecified: Secondary | ICD-10-CM | POA: Diagnosis not present

## 2023-12-11 DIAGNOSIS — Z91013 Allergy to seafood: Secondary | ICD-10-CM

## 2023-12-11 DIAGNOSIS — F0280 Dementia in other diseases classified elsewhere without behavioral disturbance: Secondary | ICD-10-CM | POA: Diagnosis present

## 2023-12-11 DIAGNOSIS — K5732 Diverticulitis of large intestine without perforation or abscess without bleeding: Principal | ICD-10-CM | POA: Diagnosis present

## 2023-12-11 MED ORDER — ACETAMINOPHEN 500 MG PO TABS
1000.0000 mg | ORAL_TABLET | Freq: Once | ORAL | Status: AC
  Administered 2023-12-12: 1000 mg via ORAL
  Filled 2023-12-11: qty 2

## 2023-12-11 NOTE — ED Notes (Signed)
Patient cleaned and linen changed. 

## 2023-12-11 NOTE — ED Provider Notes (Signed)
 [place] EMERGENCY DEPARTMENT AT [facility] Provider Note   CSN: [id number] Arrival date & time: 12/11/23  2124     History Chief Complaint  Patient presents with   Carolyn Huynh is a 68 y.o. female w/ PMHx paranoid schizophrenia, alcohol use, Alzheimer's and vascular dementia, bipolar 1 with psychotic behavior who presents to the ED for evaluation of fall.  Patient states she was walking to her living room when she began having headache felt dizzy and fell forward.  Patient unsure if she hit her head.  She reports pain in her left hip and left shoulder.  Patient then tells me that she is 3 months pregnant she knows this because her husband Carolyn Huynh told her.   Daughter states patient is confused at baseline.  They state that her confusion is waxing and waning.  She frequently will say that she is pregnant.    Physical Exam Updated Vital Signs BP 132/69   Pulse 93   Temp (!) 101 F (38.3 C)   Resp (!) 22   SpO2 92%  Physical Exam Constitutional:      General: She is not in acute distress.    Comments: Chronically ill-appearing  HENT:     Head: Normocephalic and atraumatic.     Mouth/Throat:     Mouth: Mucous membranes are moist.   Eyes:     Extraocular Movements: Extraocular movements intact.     Pupils: Pupils are equal, round, and reactive to light.    Cardiovascular:     Rate and Rhythm: Normal rate and regular rhythm.     Pulses: Normal pulses.     Heart sounds: Normal heart sounds.  Pulmonary:     Effort: Pulmonary effort is normal.     Breath sounds: Normal breath sounds.  Abdominal:     General: Abdomen is flat. There is no distension.     Tenderness: There is no abdominal tenderness.   Musculoskeletal:        General: No swelling, deformity or signs of injury.     Cervical back: Neck supple. No tenderness.     Comments: Decreased range of motion of bilateral lower extremities due to pain.  Full range of motion of bilateral upper  extremities.   Skin:    General: Skin is warm and dry.   Neurological:     Mental Status: She is alert. She is disoriented.     ED Results / Procedures / Treatments   Labs (all labs ordered are listed, but only abnormal results are displayed) Labs Reviewed  CBC WITH DIFFERENTIAL/PLATELET  BASIC METABOLIC PANEL WITH GFR  ETHANOL  URINALYSIS, ROUTINE W REFLEX MICROSCOPIC  RAPID URINE DRUG SCREEN, HOSP PERFORMED    EKG None  Radiology CT Head Wo Contrast Result Date: 12/11/2023 CLINICAL DATA:  Dementia, head trauma best obtainable images as patient had difficulty following commands and holding still EXAM: CT HEAD WITHOUT CONTRAST CT CERVICAL SPINE WITHOUT CONTRAST TECHNIQUE: Multidetector CT imaging of the head and cervical spine was performed following the standard protocol without intravenous contrast. Multiplanar CT image reconstructions of the cervical spine were also generated. RADIATION DOSE REDUCTION: This exam was performed according to the departmental dose-optimization program which includes automated exposure control, adjustment of the mA and/or kV according to patient size and/or use of iterative reconstruction technique. COMPARISON:  None Available. FINDINGS: CT HEAD FINDINGS Brain: No intracranial hemorrhage, mass effect, or evidence of acute infarct. No hydrocephalus. No extra-axial fluid collection. Mild  cerebral atrophy and advanced chronic small vessel ischemic disease. Vascular: No hyperdense vessel. Intracranial arterial calcification. Skull: No fracture or focal lesion. Sinuses/Orbits: No acute finding. Other: None. CT CERVICAL SPINE FINDINGS Alignment: No evidence of traumatic malalignment. Skull base and vertebrae: No acute fracture. No primary bone lesion or focal pathologic process. Soft tissues and spinal canal: No prevertebral fluid or swelling. No visible canal hematoma. Disc levels: Mild multilevel spondylosis. No severe spinal canal narrowing. Upper chest: Reported  separately. Other: Carotid calcification. IMPRESSION: 1. No acute intracranial abnormality. 2. No acute fracture in the cervical spine. Electronically Signed   By: Norman Gatlin M.D.   On: 12/11/2023 22:50   CT Cervical Spine Wo Contrast Result Date: 12/11/2023 CLINICAL DATA:  Dementia, head trauma best obtainable images as patient had difficulty following commands and holding still EXAM: CT HEAD WITHOUT CONTRAST CT CERVICAL SPINE WITHOUT CONTRAST TECHNIQUE: Multidetector CT imaging of the head and cervical spine was performed following the standard protocol without intravenous contrast. Multiplanar CT image reconstructions of the cervical spine were also generated. RADIATION DOSE REDUCTION: This exam was performed according to the departmental dose-optimization program which includes automated exposure control, adjustment of the mA and/or kV according to patient size and/or use of iterative reconstruction technique. COMPARISON:  None Available. FINDINGS: CT HEAD FINDINGS Brain: No intracranial hemorrhage, mass effect, or evidence of acute infarct. No hydrocephalus. No extra-axial fluid collection. Mild cerebral atrophy and advanced chronic small vessel ischemic disease. Vascular: No hyperdense vessel. Intracranial arterial calcification. Skull: No fracture or focal lesion. Sinuses/Orbits: No acute finding. Other: None. CT CERVICAL SPINE FINDINGS Alignment: No evidence of traumatic malalignment. Skull base and vertebrae: No acute fracture. No primary bone lesion or focal pathologic process. Soft tissues and spinal canal: No prevertebral fluid or swelling. No visible canal hematoma. Disc levels: Mild multilevel spondylosis. No severe spinal canal narrowing. Upper chest: Reported separately. Other: Carotid calcification. IMPRESSION: 1. No acute intracranial abnormality. 2. No acute fracture in the cervical spine. Electronically Signed   By: Norman Gatlin M.D.   On: 12/11/2023 22:50   DG Chest 1 View Result  Date: 12/11/2023 CLINICAL DATA:  Status post fall. EXAM: CHEST  1 VIEW COMPARISON:  August 15, 2019 FINDINGS: The heart size and mediastinal contours are within normal limits. Mild chronic appearing increased interstitial lung markings are seen with mild scarring and/or atelectasis noted along the periphery of the mid left lung. Surgical sutures are seen overlying the bilateral upper lobes. No pleural effusion or pneumothorax is identified. Multilevel degenerative changes are present throughout the thoracic spine. IMPRESSION: Postoperative changes and chronic appearing increased interstitial lung markings with mild scarring and/or atelectasis along the periphery of the mid left lung. Electronically Signed   By: Suzen Dials M.D.   On: 12/11/2023 22:37   DG Shoulder Left Result Date: 12/11/2023 CLINICAL DATA:  Status post fall. EXAM: LEFT SHOULDER - 2+ VIEW COMPARISON:  None Available. FINDINGS: There is no evidence of an acute fracture or dislocation. Mild degenerative changes are seen involving the left acromioclavicular joint. Soft tissues are unremarkable. IMPRESSION: Mild degenerative changes without evidence of an acute osseous abnormality. Electronically Signed   By: Suzen Dials M.D.   On: 12/11/2023 22:34   DG HIP UNILAT WITH PELVIS 2-3 VIEWS RIGHT Result Date: 12/11/2023 CLINICAL DATA:  Status post fall. EXAM: DG HIP (WITH OR WITHOUT PELVIS) 2-3V RIGHT COMPARISON:  None Available. FINDINGS: There is no evidence of an acute hip fracture or dislocation. Mild degenerative changes are seen involving both  hips in the form of joint space narrowing and acetabular sclerosis. IMPRESSION: Mild degenerative changes without evidence of an acute osseous abnormality. Electronically Signed   By: Suzen Dials M.D.   On: 12/11/2023 22:32   DG HIP UNILAT WITH PELVIS 2-3 VIEWS LEFT Result Date: 12/11/2023 CLINICAL DATA:  Status post fall. EXAM: DG HIP (WITH OR WITHOUT PELVIS) 2-3V LEFT COMPARISON:  None  Available. FINDINGS: There is no evidence of hip fracture or dislocation. There is no evidence of arthropathy or other focal bone abnormality. IMPRESSION: Negative. Electronically Signed   By: Suzen Dials M.D.   On: 12/11/2023 22:31    Medications Ordered in ED Medications  acetaminophen  (TYLENOL ) tablet 1,000 mg (has no administration in time range)    ED Course/ Medical Decision Making/ A&P  Carolyn Huynh is a 68 y.o. female presents as detailed above  Differential ddx: Fall, intracranial abnormality, fracture, dislocation, contusion, UTI, pneumonia,  On arrival, patient febrile hemodynamically stable no hypoxia or respiratory distress.  Patient is disoriented  ED Work-up: Please see details of labs and imaging listed above.  Patient febrile on arrival but not tachycardic or hypotensive.  Low suspicion for sepsis at this time.  Will broaden workup as indicated.  Will proceed with basic labs including BMP CBC urinalysis UDS ethanol.  Will obtain imaging including x-ray of left shoulder CT head CT cervical spine chest x-ray bilateral hip and pelvis x-rays Patient received Tylenol  for fever. No acute abnormality on CT head CT cervical spine chest x-ray shoulder x-ray hip or pelvis x-rays.  Remaining imaging still pending at time of handoff.  Labs also still pending at time of handoff.     Patient seen with supervising physician who agrees with plan.  Final Clinical Impression(s) / ED Diagnoses Final diagnoses:  Fall, initial encounter     Waddell Seats, DO PGY-3 Emergency Medicine    Seats Waddell, DO 12/11/23 [id number]    Garrick Charleston, MD 12/12/23 [phone]

## 2023-12-11 NOTE — ED Triage Notes (Signed)
 Pt from home for fall that occurred last night. Pt reports filling dizzy then passed out. PTAR reported left hip and L shoulder pain, and headache. Usually ambulatory without assistive aid, however has not been able to ambulate since fall.EMS VS 152/72, resp 20, pulse 94, 94% RA, temp 97.7 temporal

## 2023-12-12 DIAGNOSIS — K5792 Diverticulitis of intestine, part unspecified, without perforation or abscess without bleeding: Secondary | ICD-10-CM | POA: Diagnosis present

## 2023-12-12 LAB — RAPID URINE DRUG SCREEN, HOSP PERFORMED
Amphetamines: NOT DETECTED
Barbiturates: NOT DETECTED
Benzodiazepines: NOT DETECTED
Cocaine: NOT DETECTED
Opiates: NOT DETECTED
Tetrahydrocannabinol: NOT DETECTED

## 2023-12-12 LAB — URINALYSIS, ROUTINE W REFLEX MICROSCOPIC
Bacteria, UA: NONE SEEN
Bilirubin Urine: NEGATIVE
Glucose, UA: NEGATIVE mg/dL
Ketones, ur: 5 mg/dL — AB
Leukocytes,Ua: NEGATIVE
Nitrite: NEGATIVE
Protein, ur: 30 mg/dL — AB
Specific Gravity, Urine: 1.018 (ref 1.005–1.030)
pH: 6 (ref 5.0–8.0)

## 2023-12-12 LAB — CBC
HCT: 39.1 % (ref 36.0–46.0)
Hemoglobin: 12.6 g/dL (ref 12.0–15.0)
MCH: 29.2 pg (ref 26.0–34.0)
MCHC: 32.2 g/dL (ref 30.0–36.0)
MCV: 90.7 fL (ref 80.0–100.0)
Platelets: 304 10*3/uL (ref 150–400)
RBC: 4.31 MIL/uL (ref 3.87–5.11)
RDW: 12.8 % (ref 11.5–15.5)
WBC: 15.1 10*3/uL — ABNORMAL HIGH (ref 4.0–10.5)
nRBC: 0 % (ref 0.0–0.2)

## 2023-12-12 LAB — CREATININE, SERUM
Creatinine, Ser: 0.92 mg/dL (ref 0.44–1.00)
GFR, Estimated: >60 mL/min (ref >60)

## 2023-12-12 LAB — CBC WITH DIFFERENTIAL/PLATELET
Abs Immature Granulocytes: 0.06 10*3/uL (ref 0.00–0.07)
Basophils Absolute: 0.1 10*3/uL (ref 0.0–0.1)
Basophils Relative: 1 %
Eosinophils Absolute: 0 10*3/uL (ref 0.0–0.5)
Eosinophils Relative: 0 %
HCT: 39.7 % (ref 36.0–46.0)
Hemoglobin: 12.6 g/dL (ref 12.0–15.0)
Immature Granulocytes: 1 %
Lymphocytes Relative: 5 %
Lymphs Abs: 0.6 10*3/uL — ABNORMAL LOW (ref 0.7–4.0)
MCH: 29.4 pg (ref 26.0–34.0)
MCHC: 31.7 g/dL (ref 30.0–36.0)
MCV: 92.8 fL (ref 80.0–100.0)
Monocytes Absolute: 1 10*3/uL (ref 0.1–1.0)
Monocytes Relative: 8 %
Neutro Abs: 11.2 10*3/uL — ABNORMAL HIGH (ref 1.7–7.7)
Neutrophils Relative %: 85 %
Platelets: 306 10*3/uL (ref 150–400)
RBC: 4.28 MIL/uL (ref 3.87–5.11)
RDW: 13.1 % (ref 11.5–15.5)
WBC: 13.1 10*3/uL — ABNORMAL HIGH (ref 4.0–10.5)
nRBC: 0 % (ref 0.0–0.2)

## 2023-12-12 LAB — BASIC METABOLIC PANEL WITH GFR
Anion gap: 12 (ref 5–15)
BUN: 8 mg/dL (ref 8–23)
CO2: 23 mmol/L (ref 22–32)
Calcium: 8.5 mg/dL — ABNORMAL LOW (ref 8.9–10.3)
Chloride: 101 mmol/L (ref 98–111)
Creatinine, Ser: 1.01 mg/dL — ABNORMAL HIGH (ref 0.44–1.00)
GFR, Estimated: 60 mL/min — ABNORMAL LOW (ref >60)
Glucose, Bld: 110 mg/dL — ABNORMAL HIGH (ref 70–99)
Potassium: 5 mmol/L (ref 3.5–5.1)
Sodium: 136 mmol/L (ref 135–145)

## 2023-12-12 LAB — WET PREP, GENITAL
Clue Cells Wet Prep HPF POC: NONE SEEN
Sperm: NONE SEEN
Trich, Wet Prep: NONE SEEN
WBC, Wet Prep HPF POC: <10 (ref <10)
Yeast Wet Prep HPF POC: NONE SEEN

## 2023-12-12 LAB — SEDIMENTATION RATE: Sed Rate: 80 mm/h — ABNORMAL HIGH (ref 0–22)

## 2023-12-12 LAB — ETHANOL: Alcohol, Ethyl (B): <15 mg/dL (ref <15)

## 2023-12-12 LAB — LACTIC ACID, PLASMA: Lactic Acid, Venous: 1.4 mmol/L (ref 0.5–1.9)

## 2023-12-12 LAB — HIV ANTIBODY (ROUTINE TESTING W REFLEX): HIV Screen 4th Generation wRfx: NONREACTIVE

## 2023-12-12 LAB — C-REACTIVE PROTEIN: CRP: 21.5 mg/dL — ABNORMAL HIGH (ref <1.0)

## 2023-12-12 MED ORDER — PIPERACILLIN-TAZOBACTAM 3.375 G IVPB
3.3750 g | Freq: Three times a day (TID) | INTRAVENOUS | Status: DC
  Administered 2023-12-12 – 2023-12-17 (×16): 3.375 g via INTRAVENOUS
  Filled 2023-12-12 (×15): qty 50

## 2023-12-12 MED ORDER — LACTATED RINGERS IV BOLUS
1000.0000 mL | Freq: Once | INTRAVENOUS | Status: AC
  Administered 2023-12-12: 1000 mL via INTRAVENOUS

## 2023-12-12 MED ORDER — ENOXAPARIN SODIUM 40 MG/0.4ML IJ SOSY
40.0000 mg | PREFILLED_SYRINGE | INTRAMUSCULAR | Status: DC
  Administered 2023-12-12 – 2023-12-17 (×6): 40 mg via SUBCUTANEOUS
  Filled 2023-12-12 (×6): qty 0.4

## 2023-12-12 MED ORDER — ACETAMINOPHEN 325 MG PO TABS
650.0000 mg | ORAL_TABLET | Freq: Four times a day (QID) | ORAL | Status: DC | PRN
  Administered 2023-12-13 – 2023-12-16 (×4): 650 mg via ORAL
  Filled 2023-12-12 (×5): qty 2

## 2023-12-12 MED ORDER — SODIUM CHLORIDE 0.9 % IV SOLN
1.0000 g | Freq: Once | INTRAVENOUS | Status: AC
  Administered 2023-12-12: 1 g via INTRAVENOUS
  Filled 2023-12-12: qty 10

## 2023-12-12 MED ORDER — METRONIDAZOLE 500 MG/100ML IV SOLN
500.0000 mg | Freq: Once | INTRAVENOUS | Status: AC
  Administered 2023-12-12: 500 mg via INTRAVENOUS
  Filled 2023-12-12: qty 100

## 2023-12-12 NOTE — Progress Notes (Signed)
 Pharmacy Antibiotic Note  Carolyn Huynh is a 68 y.o. female admitted on 12/11/2023 with intra-abdominal infection.  Pharmacy has been consulted for Zosyn dosing. Using historical weight estimated CrCl 18mL/min.   Plan: Zosyn 3.375g IV q8h (4 hour infusion). Follow culture data for de-escalation.  Monitor renal function for dose adjustments as indicated.   Temp (24hrs), Avg:99.1 F (37.3 C), Min:98 F (36.7 C), Max:101 F (38.3 C)  Recent Labs  Lab 12/11/23 2340 12/12/23 0250  WBC 13.1*  --   CREATININE 1.01*  --   LATICACIDVEN  --  1.4    CrCl cannot be calculated (Unknown ideal weight.).    Allergies  Allergen Reactions   Aspirin Shortness Of Breath   Shellfish Allergy Anaphylaxis   Ivp Dye [Iodinated Contrast Media] Other (See Comments)    Chest pain    Antimicrobials this admission: Zosyn 6/29 >>  Ceftriaxone x1 in ED   Microbiology results: 6/29 BCx:  6/29 UCx:    Thank you for allowing pharmacy to be a part of this patient's care.  Powell Blush, PharmD, BCCCP  12/12/2023 7:49 AM
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
## 2023-12-12 NOTE — ED Provider Notes (Signed)
 Care of patient received from prior provider at 7:14 AM, please see their note for complete H/P and care plan.  Received handoff per ED course.  Clinical Course as of 12/12/23 9285  Sat Dec 11, 2023  2255 Stable Fall Frequent falls/AMS AO [CC]    Clinical Course User Index [CC] Jerral Meth, MD   CRITICAL CARE Performed by: Meth Jerral   Total critical care time: 45 minutes  Critical care time was exclusive of separately billable procedures and treating other patients.  Critical care was necessary to treat or prevent imminent or life-threatening deterioration.  Critical care was time spent personally by me on the following activities: development of treatment plan with patient and/or surrogate as well as nursing, discussions with consultants, evaluation of patient's response to treatment, examination of patient, obtaining history from patient or surrogate, ordering and performing treatments and interventions, ordering and review of laboratory studies, ordering and review of radiographic studies, pulse oximetry and re-evaluation of patient's condition.  Reassessment: Patient's evaluation demonstrated multifocal spaces of infection.  Possible diverticulitis.  Possible ventral hernias that were easily reducible. Patient met criteria for SIRS on serial reassessment. Treated broadly with antibiotics.  I favor diverticulitis is likely etiology given the focal findings on her exam. Consulted with medicine for admission.  Disposition:   Based on the above findings, I believe this patient is stable for admission.    Patient/family educated about specific findings on our evaluation and explained exact reasons for admission.  Patient/family educated about clinical situation and time was allowed to answer questions.   Admission team communicated with and agreed with need for admission. Patient admitted. Patient ready to move at this time.     Emergency Department Medication Summary:    Medications  acetaminophen  (TYLENOL ) tablet 1,000 mg (1,000 mg Oral Given 12/12/23 0049)  cefTRIAXone (ROCEPHIN) 1 g in sodium chloride  0.9 % 100 mL IVPB (0 g Intravenous Stopped 12/12/23 0231)  metroNIDAZOLE (FLAGYL) IVPB 500 mg (0 mg Intravenous Stopped 12/12/23 0410)  lactated ringers  bolus 1,000 mL (0 mLs Intravenous Stopped 12/12/23 0326)            Jerral Meth, MD 12/12/23 [phone]

## 2023-12-13 ENCOUNTER — Other Ambulatory Visit (HOSPITAL_COMMUNITY): Payer: Self-pay

## 2023-12-13 DIAGNOSIS — Y92009 Unspecified place in unspecified non-institutional (private) residence as the place of occurrence of the external cause: Secondary | ICD-10-CM

## 2023-12-13 DIAGNOSIS — R55 Syncope and collapse: Secondary | ICD-10-CM

## 2023-12-13 DIAGNOSIS — W19XXXA Unspecified fall, initial encounter: Secondary | ICD-10-CM

## 2023-12-13 LAB — URINE CULTURE: Culture: NO GROWTH

## 2023-12-13 LAB — CBC WITH DIFFERENTIAL/PLATELET
Abs Immature Granulocytes: 0.07 10*3/uL (ref 0.00–0.07)
Basophils Absolute: 0.1 10*3/uL (ref 0.0–0.1)
Basophils Relative: 0 %
Eosinophils Absolute: 0.1 10*3/uL (ref 0.0–0.5)
Eosinophils Relative: 1 %
HCT: 36 % (ref 36.0–46.0)
Hemoglobin: 11.5 g/dL — ABNORMAL LOW (ref 12.0–15.0)
Immature Granulocytes: 1 %
Lymphocytes Relative: 6 %
Lymphs Abs: 0.8 10*3/uL (ref 0.7–4.0)
MCH: 29.2 pg (ref 26.0–34.0)
MCHC: 31.9 g/dL (ref 30.0–36.0)
MCV: 91.4 fL (ref 80.0–100.0)
Monocytes Absolute: 1 10*3/uL (ref 0.1–1.0)
Monocytes Relative: 7 %
Neutro Abs: 11.5 10*3/uL — ABNORMAL HIGH (ref 1.7–7.7)
Neutrophils Relative %: 85 %
Platelets: 336 10*3/uL (ref 150–400)
RBC: 3.94 MIL/uL (ref 3.87–5.11)
RDW: 13.1 % (ref 11.5–15.5)
WBC: 13.5 10*3/uL — ABNORMAL HIGH (ref 4.0–10.5)
nRBC: 0 % (ref 0.0–0.2)

## 2023-12-13 LAB — BASIC METABOLIC PANEL WITH GFR
Anion gap: 17 — ABNORMAL HIGH (ref 5–15)
BUN: 10 mg/dL (ref 8–23)
CO2: 19 mmol/L — ABNORMAL LOW (ref 22–32)
Calcium: 8.3 mg/dL — ABNORMAL LOW (ref 8.9–10.3)
Chloride: 99 mmol/L (ref 98–111)
Creatinine, Ser: 1.18 mg/dL — ABNORMAL HIGH (ref 0.44–1.00)
GFR, Estimated: 50 mL/min — ABNORMAL LOW (ref >60)
Glucose, Bld: 67 mg/dL — ABNORMAL LOW (ref 70–99)
Potassium: 3.3 mmol/L — ABNORMAL LOW (ref 3.5–5.1)
Sodium: 135 mmol/L (ref 135–145)

## 2023-12-13 LAB — MAGNESIUM: Magnesium: 2 mg/dL (ref 1.7–2.4)

## 2023-12-13 LAB — HEPATIC FUNCTION PANEL
ALT: 10 U/L (ref 0–44)
AST: 15 U/L (ref 15–41)
Albumin: 2.2 g/dL — ABNORMAL LOW (ref 3.5–5.0)
Alkaline Phosphatase: 54 U/L (ref 38–126)
Bilirubin, Direct: 0.3 mg/dL — ABNORMAL HIGH (ref 0.0–0.2)
Indirect Bilirubin: 1.1 mg/dL — ABNORMAL HIGH (ref 0.3–0.9)
Total Bilirubin: 1.4 mg/dL — ABNORMAL HIGH (ref 0.0–1.2)
Total Protein: 7.8 g/dL (ref 6.5–8.1)

## 2023-12-13 LAB — PHOSPHORUS: Phosphorus: 4.3 mg/dL (ref 2.5–4.6)

## 2023-12-13 MED ORDER — SODIUM CHLORIDE 0.9 % IV SOLN: INTRAVENOUS | Status: DC

## 2023-12-13 MED ORDER — POTASSIUM CHLORIDE CRYS ER 20 MEQ PO TBCR
40.0000 meq | EXTENDED_RELEASE_TABLET | Freq: Two times a day (BID) | ORAL | Status: AC
  Administered 2023-12-13 (×2): 40 meq via ORAL
  Filled 2023-12-13 (×2): qty 2

## 2023-12-13 MED ORDER — SODIUM CHLORIDE 0.9 % IV BOLUS
500.0000 mL | Freq: Once | INTRAVENOUS | Status: AC
  Administered 2023-12-13: 500 mL via INTRAVENOUS

## 2023-12-13 NOTE — Evaluation (Signed)
 Physical Therapy Evaluation Patient Details Name: Carolyn Huynh MRN: [id number] DOB: [birth year] Today's Date: 12/13/2023  History of Present Illness  80F admitted after fall in kitchen; presented with weakness; working dx of syncope, diverticulitis; h/o dementia, schizophrenia, alcohol abuse, and recurrent right pneumothoraces s/p pleurodesis p/w fever and leukocytosis and CT abd c/f diverticulitis vs incarcerated hernia.  Clinical Impression   Pt admitted with above diagnosis. Lives at home with her daughter who works during the day, and tells me she lives in a single level house; Prior to admission, pt was able to manage independently in the home while daughter is at work, walks without an assistive device; Presents to PT with generalized weakness and decr functional mobility;  Needs up to mod assist for bed mobility and sit to stand; Once up and standing, ambulation is easier for her, and she was able to walk the hallways with a rollator and min assist/CGA; Anticipate she will be able to dc back to home, and would like Home PT follow up; Pt currently with functional limitations due to the deficits listed below (see PT Problem List). Pt will benefit from skilled PT to increase their independence and safety with mobility to allow discharge to the venue listed below.           If plan is discharge home, recommend the following: A little help with walking and/or transfers;A little help with bathing/dressing/bathroom;Assistance with cooking/housework;Help with stairs or ramp for entrance;Assist for transportation   Can travel by private vehicle        Equipment Recommendations None recommended by PT (Seems well-equipped; will update recs as needed as more information is available)  Recommendations for Other Services       Functional Status Assessment Patient has had a recent decline in their functional status and demonstrates the ability to make significant improvements in function in a  reasonable and predictable amount of time.     Precautions / Restrictions Precautions Precautions: Fall Restrictions Weight Bearing Restrictions Per Provider Order: No      Mobility  Bed Mobility Overal bed mobility: Needs Assistance Bed Mobility: Rolling, Sidelying to Sit Rolling: Min assist Sidelying to sit: Mod assist       General bed mobility comments: Multimodal cueing for log roll technqiue; min assist to roll and mod assist to elevate trunk to sit    Transfers Overall transfer level: Needs assistance Equipment used: Rollator (4 wheels) Transfers: Sit to/from Stand Sit to Stand: Mod assist           General transfer comment: Light mod assist to power up; cues for hand placement and safety    Ambulation/Gait Ambulation/Gait assistance: Min assist, Contact guard assist Gait Distance (Feet): 80 Feet Assistive device: Rollator (4 wheels) Gait Pattern/deviations: Step-through pattern, Trunk flexed       General Gait Details: Overall smooth gait with occasional cues for fully upright posture; no reports of dizziness or presyncopal symptoms  Stairs            Wheelchair Mobility     Tilt Bed    Modified Rankin (Stroke Patients Only)       Balance Overall balance assessment: Mild deficits observed, not formally tested                                           Pertinent Vitals/Pain Pain Assessment Pain Assessment: Faces Faces Pain Scale: Hurts a  little bit Pain Location: Mostly nausea/abdominal discomfort Pain Descriptors / Indicators: Discomfort Pain Intervention(s): Monitored during session    Home Living Family/patient expects to be discharged to:: Private residence Living Arrangements: Children Available Help at Discharge:  (Daughter) Type of Home: House (pt a bit unclear -- but I believe it i sa house) Home Access: Level entry       Home Layout: One level Home Equipment: Agricultural consultant (2 wheels) (Pt also  mentioned a wheelchair) Additional Comments: Above information will have to be confirmed with family    Prior Function               Mobility Comments: Pt reports at home she walks without an assistive device ADLs Comments: REports independence     Extremity/Trunk Assessment   Upper Extremity Assessment Upper Extremity Assessment: Defer to OT evaluation    Lower Extremity Assessment Lower Extremity Assessment: Generalized weakness;LLE deficits/detail LLE Deficits / Details: Pt reports that her LLE has been paralyzed, and noted difficulty moving LLE in the bed; once up and walking, her gait pattern was smooth and grossly symmetrical       Communication   Communication Communication: No apparent difficulties    Cognition Arousal: Alert Behavior During Therapy: WFL for tasks assessed/performed   PT - Cognitive impairments: No family/caregiver present to determine baseline                         Following commands: Intact       Cueing Cueing Techniques: Verbal cues, Tactile cues, Gestural cues     General Comments General comments (skin integrity, edema, etc.): Pleasant and engaged in session; Tells me she likes to be up and moving    Exercises     Assessment/Plan    PT Assessment Patient needs continued PT services  PT Problem List Decreased strength;Decreased activity tolerance;Decreased balance;Decreased mobility;Decreased coordination;Decreased cognition;Decreased knowledge of use of DME;Decreased safety awareness;Decreased knowledge of precautions;Cardiopulmonary status limiting activity       PT Treatment Interventions DME instruction;Gait training;Stair training;Functional mobility training;Therapeutic activities;Therapeutic exercise;Balance training;Neuromuscular re-education;Cognitive remediation;Patient/family education    PT Goals (Current goals can be found in the Care Plan section)  Acute Rehab PT Goals Patient Stated Goal: feel  better PT Goal Formulation: With patient Time For Goal Achievement: 12/27/23 Potential to Achieve Goals: Good    Frequency Min 2X/week     Co-evaluation               AM-PAC PT 6 Clicks Mobility  Outcome Measure Help needed turning from your back to your side while in a flat bed without using bedrails?: A Little Help needed moving from lying on your back to sitting on the side of a flat bed without using bedrails?: A Lot Help needed moving to and from a bed to a chair (including a wheelchair)?: A Lot Help needed standing up from a chair using your arms (e.g., wheelchair or bedside chair)?: A Lot Help needed to walk in hospital room?: A Little Help needed climbing 3-5 steps with a railing? : A Little 6 Click Score: 15    End of Session Equipment Utilized During Treatment: Gait belt Activity Tolerance: Patient tolerated treatment well Patient left: with call bell/phone within reach;Other (comment) (oN bsc) Nurse Communication: Mobility status PT Visit Diagnosis: Unsteadiness on feet (R26.81);History of falling (Z91.81)    Time: 9143-9063 PT Time Calculation (min) (ACUTE ONLY): 40 min   Charges:   PT Evaluation $PT Eval Moderate Complexity: 1  Mod PT Treatments $Gait Training: 8-22 mins $Therapeutic Activity: 8-22 mins PT General Charges $$ ACUTE PT VISIT: 1 Visit         Silvano Currier, PT  Acute Rehabilitation Services Office [phone] Secure Chat welcomed   Silvano VEAR Currier 12/13/2023, 11:31 AM

## 2023-12-13 NOTE — Hospital Course (Addendum)
 The patient is a 68 year old AAF past medical history significant for but not limited to to dementia, schizophrenia, alcohol abuse, history of recurrent right pneumothorax status post pleurodesis presented with fever, leukocytosis and abdominal discomfort.  CT scan of the abdomen and pelvis was done was concerning for diverticulitis versus incarcerated hernias.   Of note the patient had recently fallen and then daughter checked on her and initially refused to go to the ED. The patient got up and went to bed and lay down and then subsequently when her daughter rechecked on her she was unable to get out of the bed.  She had a trauma workup here with CT scan of the abdomen pelvis, CT of the head and CT cervical spine without acute fractures.  She left x-ray and right hip x-ray without fractures.  She is empirically started on IV antibiotics and admitted for further evaluation for Syncope and unwitnessed fall.  CT scan of the abdomen pelvis was concerning for diverticulitis with mild with possible concern for strangulation or incarceration of the hernia.  Inflammatory markers are elevated.  PT OT recommended home health and will continue IV fluid hydration through today. ECHO showed G1DD. Anticipate D/C in the next 24 hours.  Assessment and Plan:  Unwitnessed Fall w/ Concern for Syncope: C/w Tele Monitoring while hospitalized and consider Holter vs Zio monitor on discharge; Check Orthostatic VS in the AM. TTE ordered and showed EF of 60-65% with no RWMA and LVDP c/w G1DD. PT/OT recommending Home Health. Head and Cervical CT done and showed No acute intracranial abnormality. No acute fracture in the cervical spine. UDS Negative and U/A not really indicative of U/A. Anticipating D/C    ? Left Gluteal Cleft Cellulitis Diverticulitis / Possible incarcerated abdominal incisional hernia  -General Surgery consulted and Dr. Georgina spoke with Dr. Lyndel and per his review of CT pt only has omental fat  incarcerated w/o bowel; thus, no need for intervention. -CT done and showed No evidence of acute traumatic injury in the chest, abdomen, or pelvis. Patient was noted  to Chronic scarring in the anterior left upper lobe. Possible superimposed infection in the areas of scarring versus progressive fibrosis compared to 2019. Notably there was also Wall thickening about the sigmoid colon in an area of diverticulosis. Minimal if any adjacent stranding. Findings may represent early/mild diverticulitis. Primary differential consideration is malignancy and correlation with colonoscopy is recommended when clinically appropriate. Also there was Stranding and fluid within the 2 periumbilical ventral abdominal wall hernias. Correlate for strangulation/incarceration. -CT Scan also showed partially visualized soft tissue thickening and edema/stranding in the left gluteal soft tissues near the gluteal cleft. She does complain of pain in this area and on evaluation had some broken skin and changes with edema.  -C/w IV Zosyn for now and transition to po ; Given 1 Liter Bolus of LR. Continued MIVF: NS at  75 mL/hr x 1 Day -Checking GI Pathogen Panel and C Difficle and still Pending  -Check Blood Cx x2 showing no growth to date at 2 days -CXR done and showed Postoperative changes and chronic appearing increased interstitial lung markings with mild scarring and/or atelectasis along the periphery of the mid left lung. -Tylenol  prn -WBC went from 13.1 -> 15.1 -> 13.5 -> 10.3 -F/u blood and urine cultures -ESR was 80 and CRP was 21.5 so we will continue IV antibiotics for now -CTM as patient was eating fairly well and anticipating D/C in the Next 24 hours if stable  Hyperbilirubinemia: Mild. Has  a T Bili of 1.4. CTM and Trend and repeat CMP in the AM  CKD Stage 3a / Elevated Anion Gap Metabolic Acidosis: Prior Baseline was around 0.7-0.9 but that was several years ago. BUN/Cr went from 8/1.01 -> 10/1.18 -> 10/1.03.  Had a slight Metabolic Acidosis but now improved as CO2 is 27, anion gap of 5, and Chloride Level of 106 -IVF as above -Avoid Nephrotoxic Medications, Contrast Dyes, Hypotension and Dehydration to Ensure Adequate Renal Perfusion and will need to Renally Adjust Meds. CTM and Trend Renal Function carefully and repeat CMP in the AM   Hypokalemia: K+ is now 4.1. Replete w/ po K Phos Neutral 500 mg x1. Mag Level was 2.0. CTM and Replete as Necessary. Repeat CMP in the AM  Hypophosphatemia: Phos Level was 2.3. Replete w/ po K Phos Neutral 500 mg x1. CTM and Replete as Necessary. Repeat CMP in the AM  Normocytic Anemia: Hgb/Hct went from 12.6/39.1 -> 11.5/36.0 x2. Checked Anemia Panel and it showed a iron level of 30, UIBC 123, TIBC 153, saturation ration of 20%, ferritin of 172, folate level 8.4, vitamin B12 of 893. CTM for S/Sx of Bleeding; No overt bleeding noted. Repeat CBC within 1 week   Physical deconditioning / GLF: PT/OT consulted; apprec eval/recs and recommending Home Health   Hypoalbuminemia: Patient's Albumin Level is now 2.0. CTM and Repeat CMP in the AM

## 2023-12-13 NOTE — Evaluation (Signed)
 Occupational Therapy Evaluation Patient Details Name: Carolyn Huynh MRN: [id number] DOB: 1955/07/29 Today's Date: 12/13/2023   History of Present Illness   16F admitted after fall in kitchen; presented with weakness; working dx of syncope, diverticulitis; h/o dementia, schizophrenia, alcohol abuse, and recurrent right pneumothoraces s/p pleurodesis p/w fever and leukocytosis and CT abd c/f diverticulitis vs incarcerated hernia.     Clinical Impressions Pt presents with decline in function and safety with ADLs and ADL mobility with impaired strength, balance, endurance and cognition (hx of cognitive impairments). PTA pt reports that she she lives with her daughter,who works during the day; pt reports that she was Ind with ADLs, cooking in the home while daughter is at work and walks without an assistive device. Pt currently requires max A with LB ADLs, CGA with UB ADLs and grooming, min A with mobility/transfers using RW. Pt would benefit from acute OT services to address impairments to maximize level of function and safety      If plan is discharge home, recommend the following:   A little help with bathing/dressing/bathroom;A little help with walking and/or transfers;Assistance with cooking/housework;Supervision due to cognitive status;Direct supervision/assist for medications management;Help with stairs or ramp for entrance     Functional Status Assessment   Patient has had a recent decline in their functional status and demonstrates the ability to make significant improvements in function in a reasonable and predictable amount of time.     Equipment Recommendations   None recommended by OT     Recommendations for Other Services         Precautions/Restrictions   Precautions Precautions: Fall Restrictions Weight Bearing Restrictions Per Provider Order: No     Mobility Bed Mobility               General bed mobility comments: pt in chair     Transfers Overall transfer level: Needs assistance Equipment used: Rolling walker (2 wheels) Transfers: Sit to/from Stand Sit to Stand: Mod assist           General transfer comment: cues for hand placement and safety      Balance Overall balance assessment: Mild deficits observed, not formally tested                                         ADL either performed or assessed with clinical judgement   ADL Overall ADL's : Needs assistance/impaired Eating/Feeding: Independent;Sitting   Grooming: Wash/dry hands;Wash/dry face;Contact guard assist;Standing   Upper Body Bathing: Contact guard assist   Lower Body Bathing: Maximal assistance   Upper Body Dressing : Contact guard assist       Toilet Transfer: Maximal assistance;Cueing for safety   Toileting- Clothing Manipulation and Hygiene: Moderate assistance;Sit to/from stand       Functional mobility during ADLs: Moderate assistance;Minimal assistance;Rolling walker (2 wheels);Cueing for safety       Vision Baseline Vision/History: 1 Wears glasses Ability to See in Adequate Light: 0 Adequate Patient Visual Report: No change from baseline       Perception         Praxis         Pertinent Vitals/Pain Pain Assessment Pain Assessment: Faces Faces Pain Scale: Hurts a little bit Pain Location: back Pain Descriptors / Indicators: Discomfort, Aching Pain Intervention(s): Monitored during session, Repositioned     Extremity/Trunk Assessment Upper Extremity Assessment Upper Extremity Assessment: Generalized weakness  Lower Extremity Assessment Lower Extremity Assessment: Defer to PT evaluation LLE Deficits / Details: Pt reports that her LLE has been paralyzed, and noted difficulty moving LLE in the bed; once up and walking, her gait pattern was smooth and grossly symmetrical       Communication Communication Communication: No apparent difficulties   Cognition Arousal:  Alert Behavior During Therapy: WFL for tasks assessed/performed Cognition: No family/caregiver present to determine baseline             OT - Cognition Comments: Pt reporting that she fell at home because she was drop kicked and hit with a hammer while frying fish                 Following commands: Intact       Cueing  General Comments   Cueing Techniques: Verbal cues;Tactile cues;Gestural cues  Pleasant and engaged in session; Tells me she likes to be up and moving   Exercises     Shoulder Instructions      Home Living Family/patient expects to be discharged to:: Private residence Living Arrangements: Children Available Help at Discharge: Family Type of Home: House Home Access: Level entry     Home Layout: One level     Bathroom Shower/Tub: Chief Strategy Officer: Standard     Home Equipment: Agricultural consultant (2 wheels)   Additional Comments: pt with some confusion, PLOF and home set up info may need to be verified by family      Prior Functioning/Environment               Mobility Comments: Pt reports at home she walks without an assistive device ADLs Comments: pt reports she was Ind, bathes at sink most of the time    OT Problem List: Impaired vision/perception;Decreased cognition;Decreased activity tolerance;Decreased safety awareness;Impaired balance (sitting and/or standing);Pain   OT Treatment/Interventions: Self-care/ADL training;Patient/family education;Balance training;Therapeutic activities;DME and/or AE instruction      OT Goals(Current goals can be found in the care plan section)   Acute Rehab OT Goals Patient Stated Goal: go home OT Goal Formulation: With patient Time For Goal Achievement: 12/27/23 Potential to Achieve Goals: Good ADL Goals Pt Will Perform Grooming: with supervision;with set-up;standing Pt Will Perform Upper Body Bathing: with supervision;with set-up;sitting Pt Will Perform Lower Body Bathing:  with mod assist;with min assist;sitting/lateral leans;sit to/from stand Pt Will Perform Upper Body Dressing: with supervision;with set-up;sitting Pt Will Perform Lower Body Dressing: with mod assist;with min assist;sitting/lateral leans;sit to/from stand Pt Will Transfer to Toilet: with contact guard assist;with supervision;ambulating Pt Will Perform Toileting - Clothing Manipulation and hygiene: with min assist;with contact guard assist;sitting/lateral leans;sit to/from stand   OT Frequency:  Min 2X/week    Co-evaluation              AM-PAC OT 6 Clicks Daily Activity     Outcome Measure Help from another person eating meals?: None Help from another person taking care of personal grooming?: A Little Help from another person toileting, which includes using toliet, bedpan, or urinal?: A Lot Help from another person bathing (including washing, rinsing, drying)?: A Lot Help from another person to put on and taking off regular upper body clothing?: A Little Help from another person to put on and taking off regular lower body clothing?: A Lot 6 Click Score: 16   End of Session Equipment Utilized During Treatment: Gait belt;Rolling walker (2 wheels)  Activity Tolerance: Patient tolerated treatment well Patient left: in chair;with call bell/phone within reach;with  chair alarm set  OT Visit Diagnosis: Unsteadiness on feet (R26.81);Other abnormalities of gait and mobility (R26.89);History of falling (Z91.81);Pain Pain - part of body:  (back)                Time: 8796-8763 OT Time Calculation (min): 33 min Charges:  OT General Charges $OT Visit: 1 Visit OT Evaluation $OT Eval Moderate Complexity: 1 Mod OT Treatments $Self Care/Home Management : 8-22 mins    Jacques Karna Loose 12/13/2023, 2:15 PM

## 2023-12-13 NOTE — Progress Notes (Signed)
 PROGRESS NOTE    Carolyn Huynh  FMW:[id number] DOB: 1955/09/10 DOA: 12/11/2023 PCP: Patient, [name]   Brief Narrative:  The patient is a 68 year old AAF past medical history significant for but not limited to to dementia, schizophrenia, alcohol abuse, history of recurrent right pneumothorax status post pleurodesis presented with fever, leukocytosis and abdominal discomfort.  CT scan of the abdomen and pelvis was done was concerning for diverticulitis versus incarcerated hernias.   Of note the patient had recently fallen and then daughter checked on her and initially refused to go to the ED. The patient got up and went to bed and lay down and then subsequently when her daughter rechecked on her she was unable to get out of the bed.  She had a trauma workup here with CT scan of the abdomen pelvis, CT of the head and CT cervical spine without acute fractures.  She left x-ray and right hip x-ray without fractures.  She is empirically started on IV antibiotics and admitted for further evaluation for Syncope and unwitnessed fall.  CT scan of the abdomen pelvis was concerning for diverticulitis with mild with possible concern for strangulation or incarceration of the hernia.  Inflammatory markers are elevated.  PT OT recommended home health and will continue IV fluid hydration and check echocardiogram given the fall.  Assessment and Plan:  Unwitnessed Fall w/ Concern for Syncope: C/w Tele Monitoring while hospitalized and consider holter monitor on discharge; Check Orthostatic VS. TTE ordered and pending. PT/OT recommending Home Health. Head and Cervical CT done and showed No acute intracranial abnormality. No acute fracture in the cervical spine. UDS Negative and U/A not really indicative of U/A   Diverticulitis / Possible incarcerated abdominal incisional hernia  -General Surgery consulted and Dr. Georgina spoke with Dr. Lyndel and per his review of CT pt only has omental fat incarcerated w/o  bowel; thus, no need for intervention. -CT done and showed No evidence of acute traumatic injury in the chest, abdomen, or pelvis. Patient was noted  to Chronic scarring in the anterior left upper lobe. Possible superimposed infection in the areas of scarring versus progressive fibrosis compared to 2019. Notably there was also Wall thickening about the sigmoid colon in an area of diverticulosis. Minimal if any adjacent stranding. Findings may represent early/mild diverticulitis. Primary differential consideration is malignancy and correlation with colonoscopy is recommended when clinically appropriate. Also there was Stranding and fluid within the 2 periumbilical ventral abdominal wall hernias. Correlate for strangulation/incarceration. -C/w IV Zosyn for now; Given 1 Liter Bolus of LR. Start MIVF: NS at  75 mL/hr  -Checking GI Pathogen Panel and C Difficle  -Tylenol  prn -WBC went from 13.1 -> 15.1 -> 13.5 -F/u blood and urine cultures -ESR was 80 and CRP was 21.5 -CTM as patient was eating fairly well  Hyperbilirubinemia: Mild. Has a T Bili of 1.4. CTM and Trend and repeat CMP in the AM  AKI / Elevated Anion Gap Metabolic Acidosis: BUN/Cr Trend: Recent Labs  Lab 12/11/23 2340 12/12/23 1558 12/13/23 0542  BUN 8  --  10  CREATININE 1.01* 0.92 1.18*  -Has a slight Metabolic Acidosis with CO2 of 19, anion gap of 17, and Chloride Level of 99 -IVF as above -Avoid Nephrotoxic Medications, Contrast Dyes, Hypotension and Dehydration to Ensure Adequate Renal Perfusion and will need to Renally Adjust Meds -Continue to Monitor and Trend Renal Function carefully and repeat CMP in the AM   Hypokalemia: K+ is now 3.3. Replete w/ po KCL 40  mEQ BID x2. Mag Level was 2.0. CTM and Replete as Necessary. Repeat CMP in the AM  ? Left Gluteal Cleft Cellulitis: CT scan done and showed partially partially Partially visualized soft tissue thickening and edema/stranding in the left gluteal soft tissues near the  gluteal cleft. Abx As above  Normocytic Anemia: Hgb/Hct went from 12.6/39.1 -> 11.5/36.0. Check Anemia Panel in the AM. CTM for S/Sx of Bleeding; No overt bleeding noted. Repeat CBC in the AM   Physical deconditioning / GLF: PT/OT consulted; apprec eval/recs   Hypoalbuminemia: Patient's Albumin Level was 2.2. CTM and Repeat CMP in the AM   DVT prophylaxis: enoxaparin  (LOVENOX ) injection 40 mg Start: 12/12/23 0745    Code Status: Full Code Family Communication: No family currently at bedside  Disposition Plan:  Level of care: Telemetry Medical Status is: Inpatient Remains inpatient appropriate because: Seen in the chair and eating her lunch.  Feels okay denies any complaints.  No nausea or vomiting.  No other concerns or complaints at this time.   Consultants:  None  Procedures:  As delineated as above  Antimicrobials:  Anti-infectives (From admission, onward)    Start     Dose/Rate Route Frequency Ordered Stop   12/12/23 0800  piperacillin-tazobactam (ZOSYN) IVPB 3.375 g        3.375 g 12.5 mL/hr over 240 Minutes Intravenous Every 8 hours 12/12/23 0750     12/12/23 0200  cefTRIAXone (ROCEPHIN) 1 g in sodium chloride  0.9 % 100 mL IVPB        1 g 200 mL/hr over 30 Minutes Intravenous  Once 12/12/23 0155 12/12/23 0231   12/12/23 0200  metroNIDAZOLE (FLAGYL) IVPB 500 mg        500 mg 100 mL/hr over 60 Minutes Intravenous  Once 12/12/23 0155 12/12/23 0410       Subjective: Seen and examined at bedside and she was sitting in a chair and felt okay.  Denied any nausea or vomiting.  No other concerns or complaints at this time.  Objective: Vitals:   12/12/23 1943 12/13/23 0452 12/13/23 0830 12/13/23 1623  BP: 134/70 116/68 106/70 110/69  Pulse: 90 86 83 79  Resp: 18 18 18 18   Temp: 100.1 F (37.8 C) 98.2 F (36.8 C) 97.8 F (36.6 C) 98.1 F (36.7 C)  TempSrc: Oral Oral    SpO2: 95% 96% 96% 98%  Weight:      Height:        Intake/Output Summary (Last 24 hours) at  12/13/2023 1924 Last data filed at 12/13/2023 1635 Gross per 24 hour  Intake 277.98 ml  Output 450 ml  Net -172.02 ml   Filed Weights   12/12/23 1422  Weight: 49.6 kg   Examination: Physical Exam:  Constitutional: Thin AAF in no acute distress and appears calm and eating her lunch Respiratory: Diminished to auscultation bilaterally, no wheezing, rales, rhonchi or crackles. Normal respiratory effort and patient is not tachypenic. No accessory muscle use.  Unlabored breathing Cardiovascular: RRR, no murmurs / rubs / gallops. S1 and S2 auscultated. No extremity edema.  Abdomen: Soft, non-tender, non-distended. Bowel sounds positive.  GU: Deferred. Musculoskeletal: No clubbing / cyanosis of digits/nails. No joint deformity upper and lower extremities.  Skin: No rashes, lesions, ulcers on limited skin evaluation. No induration; Warm and dry.  Neurologic: CN 2-12 grossly intact with no focal deficits. Romberg sign and cerebellar reflexes not assessed.  Psychiatric: She is awake and alert  Data Reviewed: I have personally reviewed following labs and imaging  studies  CBC: Recent Labs  Lab 12/11/23 2340 12/12/23 1558 12/13/23 1100  WBC 13.1* 15.1* 13.5*  NEUTROABS 11.2*  --  11.5*  HGB 12.6 12.6 11.5*  HCT 39.7 39.1 36.0  MCV 92.8 90.7 91.4  PLT 306 304 336   Basic Metabolic Panel: Recent Labs  Lab 12/11/23 2340 12/12/23 1558 12/13/23 0542 12/13/23 1100  NA 136  --  135  --   K 5.0  --  3.3*  --   CL 101  --  99  --   CO2 23  --  19*  --   GLUCOSE 110*  --  67*  --   BUN 8  --  10  --   CREATININE 1.01* 0.92 1.18*  --   CALCIUM 8.5*  --  8.3*  --   MG  --   --   --  2.0  PHOS  --   --   --  4.3   GFR: Estimated Creatinine Clearance: 34.7 mL/min (A) (by C-G formula based on SCr of 1.18 mg/dL (H)). Liver Function Tests: Recent Labs  Lab 12/13/23 1100  AST 15  ALT 10  ALKPHOS 54  BILITOT 1.4*  PROT 7.8  ALBUMIN 2.2*   No results for input(s): LIPASE,  AMYLASE in the last 168 hours. No results for input(s): AMMONIA in the last 168 hours. Coagulation Profile: No results for input(s): INR, PROTIME in the last 168 hours. Cardiac Enzymes: No results for input(s): CKTOTAL, CKMB, CKMBINDEX, TROPONINI in the last 168 hours. BNP (last 3 results) No results for input(s): PROBNP in the last 8760 hours. HbA1C: No results for input(s): HGBA1C in the last 72 hours. CBG: No results for input(s): GLUCAP in the last 168 hours. Lipid Profile: No results for input(s): CHOL, HDL, LDLCALC, TRIG, CHOLHDL, LDLDIRECT in the last 72 hours. Thyroid Function Tests: No results for input(s): TSH, T4TOTAL, FREET4, T3FREE, THYROIDAB in the last 72 hours. Anemia Panel: No results for input(s): VITAMINB12, FOLATE, FERRITIN, TIBC, IRON, RETICCTPCT in the last 72 hours. Sepsis Labs: Recent Labs  Lab 12/12/23 0250  LATICACIDVEN 1.4   Recent Results (from the past 240 hours)  Urine Culture     Status: None   Collection Time: 12/12/23  1:55 AM   Specimen: Urine, Clean Catch  Result Value Ref Range Status   Specimen Description URINE, CLEAN CATCH  Final   Special Requests NONE  Final   Culture   Final    NO GROWTH Performed at [facility], [address], [place], [place] [address]    Report Status 12/13/2023 FINAL  Final  Wet prep, genital     Status: None   Collection Time: 12/12/23  2:09 AM  Result Value Ref Range Status   Yeast Wet Prep HPF POC NONE SEEN NONE SEEN Final   Trich, Wet Prep NONE SEEN NONE SEEN Final   Clue Cells Wet Prep HPF POC NONE SEEN NONE SEEN Final   WBC, Wet Prep HPF POC <10 <10 Final   Sperm NONE SEEN  Final    Comment: Performed at [facility], [address] [place], [place] [address]  Blood culture (routine x 2)     Status: None (Preliminary result)   Collection Time: 12/12/23  2:48 AM   Specimen: BLOOD RIGHT HAND  Result Value Ref Range Status   Specimen  Description BLOOD RIGHT HAND  Final   Special Requests   Final    BOTTLES DRAWN AEROBIC ONLY Blood Culture  results may not be optimal due to an inadequate volume of blood received in culture bottles   Culture   Final    NO GROWTH 1 DAY Performed at [facility], [address] [place], [place] [address]    Report Status PENDING  Incomplete  Blood culture (routine x 2)     Status: None (Preliminary result)   Collection Time: 12/12/23  2:50 AM   Specimen: Left; Blood  Result Value Ref Range Status   Specimen Description LEFT WRIST  Final   Special Requests   Final    BOTTLES DRAWN AEROBIC AND ANAEROBIC Blood Culture results may not be optimal due to an inadequate volume of blood received in culture bottles   Culture   Final    NO GROWTH 1 DAY Performed at [facility], [address] [place], [place] [address]    Report Status PENDING  Incomplete    Radiology Studies: CT CHEST ABDOMEN PELVIS WO CONTRAST Result Date: 12/12/2023 CLINICAL DATA:  Blunt polytrauma.  Iodinated contrast allergy. EXAM: CT CHEST, ABDOMEN AND PELVIS WITHOUT CONTRAST TECHNIQUE: Multidetector CT imaging of the chest, abdomen and pelvis was performed following the standard protocol without IV contrast. RADIATION DOSE REDUCTION: This exam was performed according to the departmental dose-optimization program which includes automated exposure control, adjustment of the mA and/or kV according to patient size and/or use of iterative reconstruction technique. COMPARISON:  Chest radiograph 12/11/2023 and CT abdomen pelvis 08/25/2018 FINDINGS: CT CHEST FINDINGS Cardiovascular: Small pericardial effusion. Normal heart size. Normal caliber thoracic aorta. Aortic atherosclerotic calcification. Evaluation for vascular injury is limited without IV contrast. Mediastinum/Nodes: Trachea and esophagus are unremarkable. No mediastinal hematoma. Shotty mediastinal lymph nodes are likely reactive. Lungs/Pleura: Emphysema. Chronic  scarring in the anterior left upper lobe. Possible superimposed infection in the left upper lobe in the areas of scarring versus progressive fibrosis compared to 2019. Postoperative change in the right apex. No pleural effusion or pneumothorax. Musculoskeletal: No acute fracture. 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 CT ABDOMEN PELVIS FINDINGS Hepatobiliary: Evaluation for visceral organ injury is limited without IV contrast. Unremarkable noncontrast appearance of the liver and biliary tree. Pancreas: Unremarkable. Spleen: Unremarkable. Adrenals/Urinary Tract: No adrenal hemorrhage or renal injury identified. Bladder is unremarkable. Stomach/Bowel: No bowel obstruction. Wall thickening about the sigmoid colon in an area of diverticulosis (series 7/image 445). Minimal if any adjacent stranding. No adjacent fluid. Stomach is within normal limits. Vascular/Lymphatic: Aortic atherosclerotic calcification. No lymphadenopathy. Reproductive: Calcified fibroid in the uterus.  No adnexal mass. Other: No free intraperitoneal fluid or air. Stranding and fluid within the 2 periumbilical ventral abdominal wall hernias. Partially visualized soft tissue thickening and edema/stranding in the left gluteal soft tissues near the gluteal cleft (series 3/image 119). Correlate for cellulitis. Musculoskeletal: No acute fracture. IMPRESSION: 1. No evidence of acute traumatic injury in the chest, abdomen, or pelvis. 2. Chronic scarring in the anterior left upper lobe. Possible superimposed infection in the areas of scarring versus progressive fibrosis compared to 2019. 3. Wall thickening about the sigmoid colon in an area of diverticulosis. Minimal if any adjacent stranding. Findings may represent early/mild diverticulitis. Primary differential consideration is malignancy and correlation with colonoscopy is recommended when clinically appropriate. 4. Stranding and fluid within the 2 periumbilical  ventral abdominal wall hernias. Correlate for strangulation/incarceration. 5. Partially visualized soft tissue thickening and edema/stranding in the left gluteal soft tissues near the gluteal cleft. Correlate for cellulitis. Aortic Atherosclerosis (ICD10-I70.0) and Emphysema (ICD10-J43.9). Electronically Signed  By: Norman Gatlin M.D.   On: 12/12/2023 01:45   CT Head Wo Contrast Result Date: 12/11/2023 CLINICAL DATA:  Dementia, head trauma best obtainable images as patient had difficulty following commands and holding still EXAM: CT HEAD WITHOUT CONTRAST CT CERVICAL SPINE WITHOUT CONTRAST TECHNIQUE: Multidetector CT imaging of the head and cervical spine was performed following the standard protocol without intravenous contrast. Multiplanar CT image reconstructions of the cervical spine were also generated. RADIATION DOSE REDUCTION: This exam was performed according to the departmental dose-optimization program which includes automated exposure control, adjustment of the mA and/or kV according to patient size and/or use of iterative reconstruction technique. COMPARISON:  None Available. FINDINGS: CT HEAD FINDINGS Brain: No intracranial hemorrhage, mass effect, or evidence of acute infarct. No hydrocephalus. No extra-axial fluid collection. Mild cerebral atrophy and advanced chronic small vessel ischemic disease. Vascular: No hyperdense vessel. Intracranial arterial calcification. Skull: No fracture or focal lesion. Sinuses/Orbits: No acute finding. Other: None. CT CERVICAL SPINE FINDINGS Alignment: No evidence of traumatic malalignment. Skull base and vertebrae: No acute fracture. No primary bone lesion or focal pathologic process. Soft tissues and spinal canal: No prevertebral fluid or swelling. No visible canal hematoma. Disc levels: Mild multilevel spondylosis. No severe spinal canal narrowing. Upper chest: Reported separately. Other: Carotid calcification. IMPRESSION: 1. No acute intracranial abnormality.  2. No acute fracture in the cervical spine. Electronically Signed   By: Norman Gatlin M.D.   On: 12/11/2023 22:50   CT Cervical Spine Wo Contrast Result Date: 12/11/2023 CLINICAL DATA:  Dementia, head trauma best obtainable images as patient had difficulty following commands and holding still EXAM: CT HEAD WITHOUT CONTRAST CT CERVICAL SPINE WITHOUT CONTRAST TECHNIQUE: Multidetector CT imaging of the head and cervical spine was performed following the standard protocol without intravenous contrast. Multiplanar CT image reconstructions of the cervical spine were also generated. RADIATION DOSE REDUCTION: This exam was performed according to the departmental dose-optimization program which includes automated exposure control, adjustment of the mA and/or kV according to patient size and/or use of iterative reconstruction technique. COMPARISON:  None Available. FINDINGS: CT HEAD FINDINGS Brain: No intracranial hemorrhage, mass effect, or evidence of acute infarct. No hydrocephalus. No extra-axial fluid collection. Mild cerebral atrophy and advanced chronic small vessel ischemic disease. Vascular: No hyperdense vessel. Intracranial arterial calcification. Skull: No fracture or focal lesion. Sinuses/Orbits: No acute finding. Other: None. CT CERVICAL SPINE FINDINGS Alignment: No evidence of traumatic malalignment. Skull base and vertebrae: No acute fracture. No primary bone lesion or focal pathologic process. Soft tissues and spinal canal: No prevertebral fluid or swelling. No visible canal hematoma. Disc levels: Mild multilevel spondylosis. No severe spinal canal narrowing. Upper chest: Reported separately. Other: Carotid calcification. IMPRESSION: 1. No acute intracranial abnormality. 2. No acute fracture in the cervical spine. Electronically Signed   By: Norman Gatlin M.D.   On: 12/11/2023 22:50   DG Chest 1 View Result Date: 12/11/2023 CLINICAL DATA:  Status post fall. EXAM: CHEST  1 VIEW COMPARISON:  August 15, 2019 FINDINGS: The heart size and mediastinal contours are within normal limits. Mild chronic appearing increased interstitial lung markings are seen with mild scarring and/or atelectasis noted along the periphery of the mid left lung. Surgical sutures are seen overlying the bilateral upper lobes. No pleural effusion or pneumothorax is identified. Multilevel degenerative changes are present throughout the thoracic spine. IMPRESSION: Postoperative changes and chronic appearing increased interstitial lung markings with mild scarring and/or atelectasis along the periphery of the mid left lung. Electronically Signed  By: Suzen Dials M.D.   On: 12/11/2023 22:37   DG Shoulder Left Result Date: 12/11/2023 CLINICAL DATA:  Status post fall. EXAM: LEFT SHOULDER - 2+ VIEW COMPARISON:  None Available. FINDINGS: There is no evidence of an acute fracture or dislocation. Mild degenerative changes are seen involving the left acromioclavicular joint. Soft tissues are unremarkable. IMPRESSION: Mild degenerative changes without evidence of an acute osseous abnormality. Electronically Signed   By: Suzen Dials M.D.   On: 12/11/2023 22:34   DG HIP UNILAT WITH PELVIS 2-3 VIEWS RIGHT Result Date: 12/11/2023 CLINICAL DATA:  Status post fall. EXAM: DG HIP (WITH OR WITHOUT PELVIS) 2-3V RIGHT COMPARISON:  None Available. FINDINGS: There is no evidence of an acute hip fracture or dislocation. Mild degenerative changes are seen involving both hips in the form of joint space narrowing and acetabular sclerosis. IMPRESSION: Mild degenerative changes without evidence of an acute osseous abnormality. Electronically Signed   By: Suzen Dials M.D.   On: 12/11/2023 22:32   DG HIP UNILAT WITH PELVIS 2-3 VIEWS LEFT Result Date: 12/11/2023 CLINICAL DATA:  Status post fall. EXAM: DG HIP (WITH OR WITHOUT PELVIS) 2-3V LEFT COMPARISON:  None Available. FINDINGS: There is no evidence of hip fracture or dislocation. There is no  evidence of arthropathy or other focal bone abnormality. IMPRESSION: Negative. Electronically Signed   By: Suzen Dials M.D.   On: 12/11/2023 22:31   Scheduled Meds:  enoxaparin  (LOVENOX ) injection  40 mg Subcutaneous Q24H   potassium chloride   40 mEq Oral BID   Continuous Infusions:  sodium chloride      piperacillin-tazobactam (ZOSYN)  IV 3.375 g (12/13/23 1421)   sodium chloride       LOS: 1 day   Alejandro Marker, DO [facility] Available via Epic secure chat 7am-7pm After these hours, please refer to coverage provider listed on [email or web address] 12/13/2023, 7:24 PM

## 2023-12-13 NOTE — Plan of Care (Signed)
   Problem: Education: Goal: Knowledge of General Education information will improve Description: Including pain rating scale, medication(s)/side effects and non-pharmacologic comfort measures Outcome: Progressing   Problem: Clinical Measurements: Goal: Respiratory complications will improve Outcome: Progressing

## 2023-12-14 ENCOUNTER — Inpatient Hospital Stay (HOSPITAL_COMMUNITY): Payer: MEDICAID

## 2023-12-14 DIAGNOSIS — R55 Syncope and collapse: Secondary | ICD-10-CM

## 2023-12-14 LAB — RETICULOCYTES
Immature Retic Fract: 8.8 % (ref 2.3–15.9)
RBC.: 3.93 MIL/uL (ref 3.87–5.11)
Retic Count, Absolute: 38.9 10*3/uL (ref 19.0–186.0)
Retic Ct Pct: 1 % (ref 0.4–3.1)

## 2023-12-14 LAB — CBC WITH DIFFERENTIAL/PLATELET
Abs Immature Granulocytes: 0.05 10*3/uL (ref 0.00–0.07)
Basophils Absolute: 0 10*3/uL (ref 0.0–0.1)
Basophils Relative: 0 %
Eosinophils Absolute: 0.5 10*3/uL (ref 0.0–0.5)
Eosinophils Relative: 4 %
HCT: 36 % (ref 36.0–46.0)
Hemoglobin: 11.5 g/dL — ABNORMAL LOW (ref 12.0–15.0)
Immature Granulocytes: 1 %
Lymphocytes Relative: 9 %
Lymphs Abs: 1 10*3/uL (ref 0.7–4.0)
MCH: 29.1 pg (ref 26.0–34.0)
MCHC: 31.9 g/dL (ref 30.0–36.0)
MCV: 91.1 fL (ref 80.0–100.0)
Monocytes Absolute: 0.7 10*3/uL (ref 0.1–1.0)
Monocytes Relative: 7 %
Neutro Abs: 8.1 10*3/uL — ABNORMAL HIGH (ref 1.7–7.7)
Neutrophils Relative %: 79 %
Platelets: 362 10*3/uL (ref 150–400)
RBC: 3.95 MIL/uL (ref 3.87–5.11)
RDW: 13.1 % (ref 11.5–15.5)
WBC: 10.3 10*3/uL (ref 4.0–10.5)
nRBC: 0 % (ref 0.0–0.2)

## 2023-12-14 LAB — COMPREHENSIVE METABOLIC PANEL WITH GFR
ALT: 9 U/L (ref 0–44)
AST: 14 U/L — ABNORMAL LOW (ref 15–41)
Albumin: 2 g/dL — ABNORMAL LOW (ref 3.5–5.0)
Alkaline Phosphatase: 47 U/L (ref 38–126)
Anion gap: 5 (ref 5–15)
BUN: 10 mg/dL (ref 8–23)
CO2: 27 mmol/L (ref 22–32)
Calcium: 8 mg/dL — ABNORMAL LOW (ref 8.9–10.3)
Chloride: 106 mmol/L (ref 98–111)
Creatinine, Ser: 1.03 mg/dL — ABNORMAL HIGH (ref 0.44–1.00)
GFR, Estimated: 58 mL/min — ABNORMAL LOW (ref >60)
Glucose, Bld: 80 mg/dL (ref 70–99)
Potassium: 4.1 mmol/L (ref 3.5–5.1)
Sodium: 138 mmol/L (ref 135–145)
Total Bilirubin: 0.4 mg/dL (ref 0.0–1.2)
Total Protein: 7.6 g/dL (ref 6.5–8.1)

## 2023-12-14 LAB — ECHOCARDIOGRAM COMPLETE BUBBLE STUDY
AR max vel: 2.27 cm2
AV Area VTI: 2.12 cm2
AV Area mean vel: 2.03 cm2
AV Mean grad: 4 mmHg
AV Peak grad: 6.7 mmHg
Ao pk vel: 1.29 m/s
Area-P 1/2: 4.68 cm2
S' Lateral: 2 cm

## 2023-12-14 LAB — FOLATE: Folate: 8.4 ng/mL (ref >5.9)

## 2023-12-14 LAB — IRON AND TIBC
Iron: 30 ug/dL (ref 28–170)
Saturation Ratios: 20 % (ref 10.4–31.8)
TIBC: 153 ug/dL — ABNORMAL LOW (ref 250–450)
UIBC: 123 ug/dL

## 2023-12-14 LAB — VITAMIN B12: Vitamin B-12: 893 pg/mL (ref 180–914)

## 2023-12-14 LAB — MAGNESIUM: Magnesium: 2 mg/dL (ref 1.7–2.4)

## 2023-12-14 LAB — PHOSPHORUS: Phosphorus: 2.3 mg/dL — ABNORMAL LOW (ref 2.5–4.6)

## 2023-12-14 LAB — FERRITIN: Ferritin: 172 ng/mL (ref 11–307)

## 2023-12-14 MED ORDER — K PHOS MONO-SOD PHOS DI & MONO 155-852-130 MG PO TABS
500.0000 mg | ORAL_TABLET | Freq: Once | ORAL | Status: AC
  Administered 2023-12-14: 500 mg via ORAL
  Filled 2023-12-14: qty 2

## 2023-12-14 MED ORDER — SODIUM CHLORIDE 0.9 % IV SOLN: INTRAVENOUS | Status: DC

## 2023-12-14 NOTE — Progress Notes (Signed)
*  PRELIMINARY RESULTS* Echocardiogram 2D Echocardiogram has been performed.  Carolyn Huynh Stallion 12/14/2023, 11:39 AM

## 2023-12-14 NOTE — Progress Notes (Signed)
 PROGRESS NOTE    Carolyn Huynh  FMW:[id number] DOB: 18-Sep-1955 DOA: 12/11/2023 PCP: Patient, [name]   Brief Narrative:  The patient is a 68 year old AAF past medical history significant for but not limited to to dementia, schizophrenia, alcohol abuse, history of recurrent right pneumothorax status post pleurodesis presented with fever, leukocytosis and abdominal discomfort.  CT scan of the abdomen and pelvis was done was concerning for diverticulitis versus incarcerated hernias.   Of note the patient had recently fallen and then daughter checked on her and initially refused to go to the ED. The patient got up and went to bed and lay down and then subsequently when her daughter rechecked on her she was unable to get out of the bed.  She had a trauma workup here with CT scan of the abdomen pelvis, CT of the head and CT cervical spine without acute fractures.  She left x-ray and right hip x-ray without fractures.  She is empirically started on IV antibiotics and admitted for further evaluation for Syncope and unwitnessed fall.  CT scan of the abdomen pelvis was concerning for diverticulitis with mild with possible concern for strangulation or incarceration of the hernia.  Inflammatory markers are elevated.  PT OT recommended home health and will continue IV fluid hydration through today. ECHO showed G1DD. Anticipate D/C in the next 24 hours.  Assessment and Plan:  Unwitnessed Fall w/ Concern for Syncope: C/w Tele Monitoring while hospitalized and consider Holter vs Zio monitor on discharge; Check Orthostatic VS in the AM. TTE ordered and showed EF of 60-65% with no RWMA and LVDP c/w G1DD. PT/OT recommending Home Health. Head and Cervical CT done and showed No acute intracranial abnormality. No acute fracture in the cervical spine. UDS Negative and U/A not really indicative of U/A. Anticipating D/C    ? Left Gluteal Cleft Cellulitis Diverticulitis / Possible incarcerated abdominal incisional  hernia  -General Surgery consulted and Dr. Georgina spoke with Dr. Lyndel and per his review of CT pt only has omental fat incarcerated w/o bowel; thus, no need for intervention. -CT done and showed No evidence of acute traumatic injury in the chest, abdomen, or pelvis. Patient was noted  to Chronic scarring in the anterior left upper lobe. Possible superimposed infection in the areas of scarring versus progressive fibrosis compared to 2019. Notably there was also Wall thickening about the sigmoid colon in an area of diverticulosis. Minimal if any adjacent stranding. Findings may represent early/mild diverticulitis. Primary differential consideration is malignancy and correlation with colonoscopy is recommended when clinically appropriate. Also there was Stranding and fluid within the 2 periumbilical ventral abdominal wall hernias. Correlate for strangulation/incarceration. -CT Scan also showed partially visualized soft tissue thickening and edema/stranding in the left gluteal soft tissues near the gluteal cleft. She does complain of pain in this area and on evaluation had some broken skin and changes with edema.  -C/w IV Zosyn for now and transition to po ; Given 1 Liter Bolus of LR. Continued MIVF: NS at  75 mL/hr x 1 Day -Checking GI Pathogen Panel and C Difficle and still Pending  -Check Blood Cx x2 showing no growth to date at 2 days -CXR done and showed Postoperative changes and chronic appearing increased interstitial lung markings with mild scarring and/or atelectasis along the periphery of the mid left lung. -Tylenol  prn -WBC went from 13.1 -> 15.1 -> 13.5 -> 10.3 -F/u blood and urine cultures -ESR was 80 and CRP was 21.5 so we will continue IV  antibiotics for now -CTM as patient was eating fairly well and anticipating D/C in the Next 24 hours if stable  Hyperbilirubinemia: Mild. Has a T Bili of 1.4. CTM and Trend and repeat CMP in the AM  CKD Stage 3a / Elevated Anion Gap  Metabolic Acidosis: Prior Baseline was around 0.7-0.9 but that was several years ago. BUN/Cr went from 8/1.01 -> 10/1.18 -> 10/1.03. Had a slight Metabolic Acidosis but now improved as CO2 is 27, anion gap of 5, and Chloride Level of 106 -IVF as above -Avoid Nephrotoxic Medications, Contrast Dyes, Hypotension and Dehydration to Ensure Adequate Renal Perfusion and will need to Renally Adjust Meds. CTM and Trend Renal Function carefully and repeat CMP in the AM   Hypokalemia: K+ is now 4.1. Replete w/ po K Phos Neutral 500 mg x1. Mag Level was 2.0. CTM and Replete as Necessary. Repeat CMP in the AM  Hypophosphatemia: Phos Level was 2.3. Replete w/ po K Phos Neutral 500 mg x1. CTM and Replete as Necessary. Repeat CMP in the AM  Normocytic Anemia: Hgb/Hct went from 12.6/39.1 -> 11.5/36.0 x2. Checked Anemia Panel and it showed a iron level of 30, UIBC 123, TIBC 153, saturation ration of 20%, ferritin of 172, folate level 8.4, vitamin B12 of 893. CTM for S/Sx of Bleeding; No overt bleeding noted. Repeat CBC within 1 week   Physical deconditioning / GLF: PT/OT consulted; apprec eval/recs and recommending Home Health   Hypoalbuminemia: Patient's Albumin Level is now 2.0. CTM and Repeat CMP in the AM   DVT prophylaxis: enoxaparin  (LOVENOX ) injection 40 mg Start: 12/12/23 0745    Code Status: Full Code Family Communication: No family present @ bedside  Disposition Plan:  Level of care: Telemetry Medical Status is: Inpatient Remains inpatient appropriate because: Will hydrate for 1 more day and continue antibiotics with IV and transition to orals tomorrow   Consultants:  None  Procedures:  As delineated as above  Antimicrobials:  Anti-infectives (From admission, onward)    Start     Dose/Rate Route Frequency Ordered Stop   12/12/23 0800  piperacillin-tazobactam (ZOSYN) IVPB 3.375 g        3.375 g 12.5 mL/hr over 240 Minutes Intravenous Every 8 hours 12/12/23 0750     12/12/23 0200   cefTRIAXone (ROCEPHIN) 1 g in sodium chloride  0.9 % 100 mL IVPB        1 g 200 mL/hr over 30 Minutes Intravenous  Once 12/12/23 0155 12/12/23 0231   12/12/23 0200  metroNIDAZOLE (FLAGYL) IVPB 500 mg        500 mg 100 mL/hr over 60 Minutes Intravenous  Once 12/12/23 0155 12/12/23 0410       Subjective: Seen and examined at bedside was complaining of buttock pain.  No nausea or vomiting.  Eating better.  Denies any lightheadedness or dizziness.  Objective: Vitals:   12/13/23 2113 12/14/23 0600 12/14/23 0811 12/14/23 1657  BP: 119/69 129/79 135/79 (!) 141/79  Pulse: 84 78 79 83  Resp: 16  18 18   Temp: 98.7 F (37.1 C) 97.7 F (36.5 C) 98 F (36.7 C) 98.1 F (36.7 C)  TempSrc: Oral Oral    SpO2: 96% 96% 94% 96%  Weight:      Height:        Intake/Output Summary (Last 24 hours) at 12/14/2023 1750 Last data filed at 12/14/2023 1413 Gross per 24 hour  Intake 1737.97 ml  Output --  Net 1737.97 ml   Filed Weights   12/12/23 1422  Weight: 49.6 kg   Examination: Physical Exam:  Constitutional: Thin AAF in NAD Respiratory: Diminished to auscultation bilaterally, no wheezing, rales, rhonchi or crackles. Normal respiratory effort and patient is not tachypenic. No accessory muscle use.  Unlabored breathing Cardiovascular: RRR, no murmurs / rubs / gallops. S1 and S2 auscultated. No extremity edema.  Abdomen: Soft, non-tender, non-distended.Bowel sounds positive.  GU: Deferred.  Left gluteal cleft a little swollen slightly painful.  Not erythematous Musculoskeletal: No clubbing / cyanosis of digits/nails. No joint deformity upper and lower extremities.  Skin: No rashes or lesions on limited skin evaluation Neurologic: CN 2-12 grossly intact with no focal deficits. Romberg sign cerebellar reflexes not assessed.  Psychiatric: She is awake and alert  Data Reviewed: I have personally reviewed following labs and imaging studies  CBC: Recent Labs  Lab 12/11/23 2340 12/12/23 1558  12/13/23 1100 12/14/23 0513  WBC 13.1* 15.1* 13.5* 10.3  NEUTROABS 11.2*  --  11.5* 8.1*  HGB 12.6 12.6 11.5* 11.5*  HCT 39.7 39.1 36.0 36.0  MCV 92.8 90.7 91.4 91.1  PLT 306 304 336 362   Basic Metabolic Panel: Recent Labs  Lab 12/11/23 2340 12/12/23 1558 12/13/23 0542 12/13/23 1100 12/14/23 0513  NA 136  --  135  --  138  K 5.0  --  3.3*  --  4.1  CL 101  --  99  --  106  CO2 23  --  19*  --  27  GLUCOSE 110*  --  67*  --  80  BUN 8  --  10  --  10  CREATININE 1.01* 0.92 1.18*  --  1.03*  CALCIUM 8.5*  --  8.3*  --  8.0*  MG  --   --   --  2.0 2.0  PHOS  --   --   --  4.3 2.3*   GFR: Estimated Creatinine Clearance: 39.8 mL/min (A) (by C-G formula based on SCr of 1.03 mg/dL (H)). Liver Function Tests: Recent Labs  Lab 12/13/23 1100 12/14/23 0513  AST 15 14*  ALT 10 9  ALKPHOS 54 47  BILITOT 1.4* 0.4  PROT 7.8 7.6  ALBUMIN 2.2* 2.0*   No results for input(s): LIPASE, AMYLASE in the last 168 hours. No results for input(s): AMMONIA in the last 168 hours. Coagulation Profile: No results for input(s): INR, PROTIME in the last 168 hours. Cardiac Enzymes: No results for input(s): CKTOTAL, CKMB, CKMBINDEX, TROPONINI in the last 168 hours. BNP (last 3 results) No results for input(s): PROBNP in the last 8760 hours. HbA1C: No results for input(s): HGBA1C in the last 72 hours. CBG: No results for input(s): GLUCAP in the last 168 hours. Lipid Profile: No results for input(s): CHOL, HDL, LDLCALC, TRIG, CHOLHDL, LDLDIRECT in the last 72 hours. Thyroid Function Tests: No results for input(s): TSH, T4TOTAL, FREET4, T3FREE, THYROIDAB in the last 72 hours. Anemia Panel: Recent Labs    12/14/23 0513  VITAMINB12 893  FOLATE 8.4  FERRITIN 172  TIBC 153*  IRON 30  RETICCTPCT 1.0   Sepsis Labs: Recent Labs  Lab 12/12/23 0250  LATICACIDVEN 1.4   Recent Results (from the past 240 hours)  Urine Culture     Status: None    Collection Time: 12/12/23  1:55 AM   Specimen: Urine, Clean Catch  Result Value Ref Range Status   Specimen Description URINE, CLEAN CATCH  Final   Special Requests NONE  Final   Culture   Final    NO GROWTH  Performed at [facility], [address] [place], [place] [address]    Report Status 12/13/2023 FINAL  Final  Wet prep, genital     Status: None   Collection Time: 12/12/23  2:09 AM  Result Value Ref Range Status   Yeast Wet Prep HPF POC NONE SEEN NONE SEEN Final   Trich, Wet Prep NONE SEEN NONE SEEN Final   Clue Cells Wet Prep HPF POC NONE SEEN NONE SEEN Final   WBC, Wet Prep HPF POC <10 <10 Final   Sperm NONE SEEN  Final    Comment: Performed at [facility], [address] [place], [place] [address]  Blood culture (routine x 2)     Status: None (Preliminary result)   Collection Time: 12/12/23  2:48 AM   Specimen: BLOOD RIGHT HAND  Result Value Ref Range Status   Specimen Description BLOOD RIGHT HAND  Final   Special Requests   Final    BOTTLES DRAWN AEROBIC ONLY Blood Culture results may not be optimal due to an inadequate volume of blood received in culture bottles   Culture   Final    NO GROWTH 2 DAYS Performed at [facility], [address], [place], [place] [address]    Report Status PENDING  Incomplete  Blood culture (routine x 2)     Status: None (Preliminary result)   Collection Time: 12/12/23  2:50 AM   Specimen: Left; Blood  Result Value Ref Range Status   Specimen Description LEFT WRIST  Final   Special Requests   Final    BOTTLES DRAWN AEROBIC AND ANAEROBIC Blood Culture results may not be optimal due to an inadequate volume of blood received in culture bottles   Culture   Final    NO GROWTH 2 DAYS Performed at [facility], [address], [place], [place] [address]    Report Status PENDING  Incomplete    Radiology Studies: ECHOCARDIOGRAM COMPLETE BUBBLE STUDY Result Date: 12/14/2023    ECHOCARDIOGRAM REPORT   Patient  Name:   Carolyn Huynh Date of Exam: 12/14/2023 Medical Rec #:  [id number]       Height:       64.0 in Accession #:    [id number]      Weight:       109.3 lb Date of Birth:  [birth year]        BSA:          1.514 m Patient Age:    68 years        BP:           129/79 mmHg Patient Gender: F               HR:           89 bpm. Exam Location:  Inpatient Procedure: 2D Echo, Cardiac Doppler and Color Doppler (Both Spectral and Color            Flow Doppler were utilized during procedure). Indications:    Syncope  History:        [name].  Sonographer:    Benard Stallion Referring Phys: [id number] MARSHA ADA IMPRESSIONS  1. Left ventricular ejection fraction, by estimation, is 60 to 65%. The left ventricle has normal function. The left ventricle has no regional wall motion abnormalities. Left ventricular diastolic parameters are consistent with Grade I diastolic dysfunction (impaired relaxation).  2. Right ventricular systolic function is normal.  The right ventricular size is normal. There is moderately elevated pulmonary artery systolic pressure. The estimated right ventricular systolic pressure is 52.9 mmHg.  3. Left atrial size was mild to moderately dilated.  4. The mitral valve is normal in structure. No evidence of mitral valve regurgitation. No evidence of mitral stenosis.  5. The aortic valve is tricuspid. Aortic valve regurgitation is not visualized. No aortic stenosis is present.  6. The inferior vena cava is normal in size with greater than 50% respiratory variability, suggesting right atrial pressure of 3 mmHg. FINDINGS  Left Ventricle: Left ventricular ejection fraction, by estimation, is 60 to 65%. The left ventricle has normal function. The left ventricle has no regional wall motion abnormalities. The left ventricular internal cavity size was normal in size. There is  no left ventricular hypertrophy. Left ventricular diastolic parameters are consistent with  Grade I diastolic dysfunction (impaired relaxation). Normal left ventricular filling pressure. Right Ventricle: The right ventricular size is normal. No increase in right ventricular wall thickness. Right ventricular systolic function is normal. There is moderately elevated pulmonary artery systolic pressure. The tricuspid regurgitant velocity is 3.46 m/s, and with an assumed right atrial pressure of 5 mmHg, the estimated right ventricular systolic pressure is 52.9 mmHg. Left Atrium: Left atrial size was mild to moderately dilated. Right Atrium: Right atrial size was normal in size. Pericardium: There is no evidence of pericardial effusion. Mitral Valve: The mitral valve is normal in structure. No evidence of mitral valve regurgitation. No evidence of mitral valve stenosis. Tricuspid Valve: The tricuspid valve is normal in structure. Tricuspid valve regurgitation is not demonstrated. No evidence of tricuspid stenosis. Aortic Valve: The aortic valve is tricuspid. Aortic valve regurgitation is not visualized. No aortic stenosis is present. Aortic valve mean gradient measures 4.0 mmHg. Aortic valve peak gradient measures 6.7 mmHg. Aortic valve area, by VTI measures 2.12 cm. Pulmonic Valve: The pulmonic valve was normal in structure. Pulmonic valve regurgitation is not visualized. No evidence of pulmonic stenosis. Aorta: The aortic root is normal in size and structure. Venous: The inferior vena cava was not well visualized. The inferior vena cava is normal in size with greater than 50% respiratory variability, suggesting right atrial pressure of 3 mmHg. IAS/Shunts: The interatrial septum is aneurysmal. No atrial level shunt detected by color flow Doppler.  LEFT VENTRICLE PLAX 2D LVIDd:         3.50 cm   Diastology LVIDs:         2.00 cm   LV e' medial:    6.96 cm/s LV PW:         0.90 cm   LV E/e' medial:  8.2 LV IVS:        0.90 cm   LV e' lateral:   8.27 cm/s LVOT diam:     1.80 cm   LV E/e' lateral: 6.9 LV SV:          56 LV SV Index:   37 LVOT Area:     2.54 cm  RIGHT VENTRICLE RV Basal diam:  3.50 cm RV Mid diam:    3.50 cm RV S prime:     17.80 cm/s TAPSE (M-mode): 2.7 cm LEFT ATRIUM             Index        RIGHT ATRIUM           Index LA diam:        3.40 cm 2.25 cm/m   RA Area:  14.00 cm LA Vol (A2C):   66.5 ml 43.94 ml/m  RA Volume:   33.50 ml  22.13 ml/m LA Vol (A4C):   37.6 ml 24.84 ml/m LA Biplane Vol: 53.3 ml 35.21 ml/m  AORTIC VALVE AV Area (Vmax):    2.27 cm AV Area (Vmean):   2.03 cm AV Area (VTI):     2.12 cm AV Vmax:           129.00 cm/s AV Vmean:          97.600 cm/s AV VTI:            0.267 m AV Peak Grad:      6.7 mmHg AV Mean Grad:      4.0 mmHg LVOT Vmax:         115.00 cm/s LVOT Vmean:        77.700 cm/s LVOT VTI:          0.222 m LVOT/AV VTI ratio: 0.83  AORTA Ao Root diam: 2.90 cm Ao Asc diam:  3.20 cm MITRAL VALVE               TRICUSPID VALVE MV Area (PHT): 4.68 cm    TR Peak grad:   47.9 mmHg MV Decel Time: 162 msec    TR Vmax:        346.00 cm/s MV E velocity: 57.00 cm/s MV A velocity: 88.30 cm/s  SHUNTS MV E/A ratio:  0.65        Systemic VTI:  0.22 m                            Systemic Diam: 1.80 cm Mihai Croitoru MD Electronically signed by Jerel Balding MD Signature Date/Time: 12/14/2023/2:33:26 PM    Final    Scheduled Meds:  enoxaparin  (LOVENOX ) injection  40 mg Subcutaneous Q24H   Continuous Infusions:  sodium chloride      piperacillin-tazobactam (ZOSYN)  IV 3.375 g (12/14/23 1518)    LOS: 2 days   Alejandro Marker, DO [facility] Available via Epic secure chat 7am-7pm After these hours, please refer to coverage provider listed on [email or web address] 12/14/2023, 5:50 PM

## 2023-12-14 NOTE — Plan of Care (Signed)
  Problem: Skin Integrity: Goal: Risk for impaired skin integrity will decrease Outcome: Progressing  INterventions Assess risk factors for imparied skin integrity and/or pressure injuries Initiate pressure injury prevention interventions if Braden Score </= 18 Assess/Monitor skin integrity, appearance and/or temperature Provide skin care

## 2023-12-15 LAB — COMPREHENSIVE METABOLIC PANEL WITH GFR
ALT: 7 U/L (ref 0–44)
AST: 14 U/L — ABNORMAL LOW (ref 15–41)
Albumin: 1.7 g/dL — ABNORMAL LOW (ref 3.5–5.0)
Alkaline Phosphatase: 43 U/L (ref 38–126)
Anion gap: 7 (ref 5–15)
BUN: 6 mg/dL — ABNORMAL LOW (ref 8–23)
CO2: 24 mmol/L (ref 22–32)
Calcium: 7.8 mg/dL — ABNORMAL LOW (ref 8.9–10.3)
Chloride: 108 mmol/L (ref 98–111)
Creatinine, Ser: 0.75 mg/dL (ref 0.44–1.00)
GFR, Estimated: >60 mL/min (ref >60)
Glucose, Bld: 87 mg/dL (ref 70–99)
Potassium: 3.9 mmol/L (ref 3.5–5.1)
Sodium: 139 mmol/L (ref 135–145)
Total Bilirubin: 0.4 mg/dL (ref 0.0–1.2)
Total Protein: 6.5 g/dL (ref 6.5–8.1)

## 2023-12-15 LAB — CBC WITH DIFFERENTIAL/PLATELET
Abs Immature Granulocytes: 0.05 10*3/uL (ref 0.00–0.07)
Basophils Absolute: 0.1 10*3/uL (ref 0.0–0.1)
Basophils Relative: 1 %
Eosinophils Absolute: 0.6 10*3/uL — ABNORMAL HIGH (ref 0.0–0.5)
Eosinophils Relative: 8 %
HCT: 32.5 % — ABNORMAL LOW (ref 36.0–46.0)
Hemoglobin: 10.6 g/dL — ABNORMAL LOW (ref 12.0–15.0)
Immature Granulocytes: 1 %
Lymphocytes Relative: 12 %
Lymphs Abs: 1 10*3/uL (ref 0.7–4.0)
MCH: 29.3 pg (ref 26.0–34.0)
MCHC: 32.6 g/dL (ref 30.0–36.0)
MCV: 89.8 fL (ref 80.0–100.0)
Monocytes Absolute: 0.8 10*3/uL (ref 0.1–1.0)
Monocytes Relative: 9 %
Neutro Abs: 5.9 10*3/uL (ref 1.7–7.7)
Neutrophils Relative %: 69 %
Platelets: 325 10*3/uL (ref 150–400)
RBC: 3.62 MIL/uL — ABNORMAL LOW (ref 3.87–5.11)
RDW: 13.4 % (ref 11.5–15.5)
WBC: 8.4 10*3/uL (ref 4.0–10.5)
nRBC: 0 % (ref 0.0–0.2)

## 2023-12-15 LAB — PHOSPHORUS: Phosphorus: 2.7 mg/dL (ref 2.5–4.6)

## 2023-12-15 LAB — MAGNESIUM: Magnesium: 1.9 mg/dL (ref 1.7–2.4)

## 2023-12-15 MED ORDER — LORATADINE 10 MG PO TABS
10.0000 mg | ORAL_TABLET | Freq: Every day | ORAL | Status: DC
  Administered 2023-12-15 – 2023-12-17 (×3): 10 mg via ORAL
  Filled 2023-12-15 (×3): qty 1

## 2023-12-15 MED ORDER — OXYCODONE HCL 5 MG PO TABS
5.0000 mg | ORAL_TABLET | Freq: Four times a day (QID) | ORAL | Status: DC | PRN
  Administered 2023-12-15 – 2023-12-17 (×5): 5 mg via ORAL
  Filled 2023-12-15 (×6): qty 1

## 2023-12-15 NOTE — Plan of Care (Signed)

## 2023-12-15 NOTE — Progress Notes (Signed)
 Physical Therapy Treatment Patient Details Name: Carolyn Huynh MRN: [id number] DOB: 08-05-1955 Today's Date: 12/15/2023   History of Present Illness 44F admitted after fall in kitchen; presented with weakness; working dx of syncope, diverticulitis; h/o dementia, schizophrenia, alcohol abuse, and recurrent right pneumothoraces s/p pleurodesis p/w fever and leukocytosis and CT abd c/f diverticulitis vs incarcerated hernia.    PT Comments  Continuing work on functional mobility and activity tolerance;  Session focused on progressive amb, with notable incr distance over last session; good use of rollator; Anticipate dc home will be the best option for pt; today, she tells me she does not have a rollator; recommend a rollator if she does not have one    If plan is discharge home, recommend the following: A little help with walking and/or transfers;A little help with bathing/dressing/bathroom;Assistance with cooking/housework;Help with stairs or ramp for entrance;Assist for transportation   Can travel by private Psychologist, clinical (4 wheels)    Recommendations for Other Services       Precautions / Restrictions Precautions Precautions: Fall Restrictions Weight Bearing Restrictions Per Provider Order: No     Mobility  Bed Mobility Overal bed mobility: Needs Assistance Bed Mobility: Rolling, Sidelying to Sit Rolling: Min assist Sidelying to sit: Mod assist       General bed mobility comments: Multimodal cueing for log roll technqiue; min assist to roll and mod assist to elevate trunk to sit    Transfers Overall transfer level: Needs assistance Equipment used: Rollator (4 wheels) Transfers: Sit to/from Stand Sit to Stand: Mod assist           General transfer comment: Light mod assist to power up; cues for hand placement and safety    Ambulation/Gait Ambulation/Gait assistance: Min assist, Contact guard assist   Assistive device: Rollator  (4 wheels) Gait Pattern/deviations: Step-through pattern, Trunk flexed       General Gait Details: Overall smooth gait with occasional cues for fully upright posture; no reports of dizziness or presyncopal symptoms   Stairs             Wheelchair Mobility     Tilt Bed    Modified Rankin (Stroke Patients Only)       Balance Overall balance assessment: Mild deficits observed, not formally tested                                          Communication Communication Communication: No apparent difficulties  Cognition Arousal: Alert Behavior During Therapy: WFL for tasks assessed/performed   PT - Cognitive impairments: No family/caregiver present to determine baseline                         Following commands: Intact      Cueing Cueing Techniques: Verbal cues, Tactile cues, Gestural cues  Exercises      General Comments        Pertinent Vitals/Pain Pain Assessment Faces Pain Scale: Hurts a little bit Pain Location: Mostly nausea/abdominal discomfort Pain Descriptors / Indicators: Discomfort    Home Living                          Prior Function            PT Goals (current goals can now be found in the  care plan section) Acute Rehab PT Goals Patient Stated Goal: feel better PT Goal Formulation: With patient Time For Goal Achievement: 12/27/23 Potential to Achieve Goals: Good Progress towards PT goals: Progressing toward goals    Frequency    Min 2X/week      PT Plan      Co-evaluation              AM-PAC PT 6 Clicks Mobility   Outcome Measure  Help needed turning from your back to your side while in a flat bed without using bedrails?: A Little Help needed moving from lying on your back to sitting on the side of a flat bed without using bedrails?: A Little Help needed moving to and from a bed to a chair (including a wheelchair)?: A Little Help needed standing up from a chair using your arms  (e.g., wheelchair or bedside chair)?: A Little Help needed to walk in hospital room?: A Little Help needed climbing 3-5 steps with a railing? : A Little 6 Click Score: 18    End of Session Equipment Utilized During Treatment: Gait belt Activity Tolerance: Patient tolerated treatment well Patient left: with call bell/phone within reach;Other (comment) (oN bsc) Nurse Communication: Mobility status PT Visit Diagnosis: Unsteadiness on feet (R26.81);History of falling (Z91.81)     Time: 8746-8682 PT Time Calculation (min) (ACUTE ONLY): 24 min  Charges:    $Gait Training: 23-37 mins PT General Charges $$ ACUTE PT VISIT: 1 Visit                     Silvano Currier, PT  Acute Rehabilitation Services Office [phone] Secure Chat welcomed    Silvano VEAR Currier 12/15/2023, 3:47 PM

## 2023-12-15 NOTE — Progress Notes (Signed)
 Occupational Therapy Treatment Patient Details Name: Carolyn Huynh MRN: [id number] DOB: 1956/02/08 Today's Date: 12/15/2023   History of present illness 21F admitted after fall in kitchen; presented with weakness; working dx of syncope, diverticulitis; h/o dementia, schizophrenia, alcohol abuse, and recurrent right pneumothoraces s/p pleurodesis p/w fever and leukocytosis and CT abd c/f diverticulitis vs incarcerated hernia.   OT comments  Pt making good progress with functional goals. Pt seated in recliner upon arrival, pleasant and agreeable to work with OT to walk to the bathroom with RW for toileting tasks, standing at sink for grooming/hygiene and seated back to chair for UB ADLs. Pt mod A with mobility/transfers and LB ADLs, pt required cues throughout for safety. OT will continue to follow acutely to maximize level of function and safety      If plan is discharge home, recommend the following:  A little help with bathing/dressing/bathroom;A little help with walking and/or transfers;Assistance with cooking/housework;Supervision due to cognitive status;Direct supervision/assist for medications management;Help with stairs or ramp for entrance   Equipment Recommendations  None recommended by OT    Recommendations for Other Services      Precautions / Restrictions Precautions Precautions: Fall Restrictions Weight Bearing Restrictions Per Provider Order: No       Mobility Bed Mobility               General bed mobility comments: pt in recliner    Transfers Overall transfer level: Needs assistance Equipment used: Rollator (4 wheels) Transfers: Sit to/from Stand Sit to Stand: Mod assist           General transfer comment: cues for hand placement and safety     Balance Overall balance assessment: Mild deficits observed, not formally tested                                         ADL either performed or assessed with clinical judgement   ADL  Overall ADL's : Needs assistance/impaired     Grooming: Wash/dry hands;Wash/dry face;Contact guard assist;Standing   Upper Body Bathing: Supervision/ safety;Set up;Sitting   Lower Body Bathing: Moderate assistance   Upper Body Dressing : Supervision/safety;Set up;Sitting       Toilet Transfer: Moderate assistance;Ambulation;Rolling walker (2 wheels);Cueing for safety;Regular Toilet;BSC/3in1   Toileting- Clothing Manipulation and Hygiene: Minimal assistance;Sit to/from stand       Functional mobility during ADLs: Moderate assistance;Rolling walker (2 wheels);Cueing for safety      Extremity/Trunk Assessment Upper Extremity Assessment Upper Extremity Assessment: Generalized weakness   Lower Extremity Assessment Lower Extremity Assessment: Defer to PT evaluation        Vision Baseline Vision/History: 1 Wears glasses Ability to See in Adequate Light: 0 Adequate Patient Visual Report: No change from baseline     Perception     Praxis     Communication Communication Communication: No apparent difficulties   Cognition Arousal: Alert Behavior During Therapy: WFL for tasks assessed/performed                                 Following commands: Intact        Cueing   Cueing Techniques: Verbal cues, Tactile cues, Gestural cues  Exercises      Shoulder Instructions       General Comments      Pertinent Vitals/ Pain  Pain Assessment Pain Assessment: Faces Faces Pain Scale: Hurts a little bit Pain Location: Mostly nausea/abdominal discomfort Pain Descriptors / Indicators: Discomfort Pain Intervention(s): Monitored during session, Repositioned  Home Living                                          Prior Functioning/Environment              Frequency  Min 2X/week        Progress Toward Goals  OT Goals(current goals can now be found in the care plan section)  Progress towards OT goals: Progressing toward  goals     Plan      Co-evaluation                 AM-PAC OT 6 Clicks Daily Activity     Outcome Measure   Help from another person eating meals?: None Help from another person taking care of personal grooming?: A Little Help from another person toileting, which includes using toliet, bedpan, or urinal?: A Little Help from another person bathing (including washing, rinsing, drying)?: A Lot Help from another person to put on and taking off regular upper body clothing?: A Little Help from another person to put on and taking off regular lower body clothing?: A Lot 6 Click Score: 17    End of Session Equipment Utilized During Treatment: Gait belt;Rolling walker (2 wheels);Other (comment) (3 in 1)  OT Visit Diagnosis: Unsteadiness on feet (R26.81);Other abnormalities of gait and mobility (R26.89);History of falling (Z91.81);Pain Pain - part of body:  (ABD)   Activity Tolerance Patient tolerated treatment well   Patient Left in chair;with call bell/phone within reach;with chair alarm set   Nurse Communication          Time: 1344-1400 OT Time Calculation (min): 16 min  Charges: OT General Charges $OT Visit: 1 Visit OT Treatments $Self Care/Home Management : 8-22 mins    Jacques Karna Loose 12/15/2023, 2:42 PM

## 2023-12-15 NOTE — Progress Notes (Signed)
 PROGRESS NOTE    Carolyn Huynh  FMW:[id number] DOB: 27-Apr-1956 DOA: 12/11/2023 PCP: Patient, [name]   Brief Narrative:   68 year old AAF past medical history significant for but not limited to to dementia, schizophrenia, alcohol abuse, history of recurrent right pneumothorax status post pleurodesis presented with fever, leukocytosis and abdominal discomfort. CT scan of the abdomen and pelvis was done was concerning for diverticulitis versus incarcerated hernias.  General surgery consulted and incarcerated abdominal hernia has been ruled out.  Patient is currently being treated for possible diverticulitis with intravenous antibiotics.  PT/OT for physical and likely discharge home with home health services in the next 24 hours.  Assessment & Plan:  Principal Problem:   Acute diverticulitis Active Problems:   Diverticulitis    Syncope, POA: Concern for syncope versus unwitnessed fall.  No evidence of any abnormal rhythms on cardiac telemetry.  Continue to monitor closely.  May need outpatient ZIO/Holter monitoring on discharge.  No episodes of syncope in the hospital.  Continue PT/OT evaluation. EF of 60 to 65% with grade 1 diastolic dysfunction on echocardiogram which was done on 12/14/2023 and no significant valvular abnormality seen.  Acute diverticulitis/possible incarcerated abdominal incisional hernia/left gluteal cleft cellulitis, POA: Patient is currently intravenous antibiotics.  She has been evaluated by general surgery and upon review of the CAT scan, they think that patient only has omental fat incarcerated without the bowel so there is no need for any surgical intervention.  Will continue to monitor closely.  [name].  Continue with the diet. Intravenous fluids stopped on 12/15/2023.  CKD stage IIIa, POA: No acute issues.  Continue to monitor urine function closely.  Avoid nephrotoxic agents.  Normocytic anemia, POA: No acute issues.  No  evidence of acute bleeding.  Follow-up H&H closely.  Physical deconditioning, POA: PT/OT on board.  [name].  Case management consulted as well.  Moderate protein calorie malnutrition, POA: As evidenced by low albumin in the setting of pelvic deconditioning and multiple medical comorbidities.  DVT prophylaxis: enoxaparin  (LOVENOX ) injection 40 mg Start: 12/12/23 0745     Code Status: Full Code Family Communication: None present at the bedside Status is: Inpatient Remains inpatient appropriate because: Physical conditioning, syncope    Subjective: She said that she still feels dizzy and shaky.  She said that she has chronic left thigh weakness but she is able to walk some.  She has been working with physical therapy and Occupational Therapy and does not think that she is ready for discharge yet.  She lives at home with her daughter.  Denies any abdominal pain this morning.   Examination:  General exam: Appears calm and comfortable  Respiratory system: Clear to auscultation. Respiratory effort normal. Cardiovascular system: S1 & S2 heard, RRR. No JVD, murmurs, rubs, gallops or clicks. No pedal edema. Gastrointestinal system: Abdomen is nondistended, soft and nontender. No organomegaly or masses felt. Normal bowel sounds heard. Central nervous system: Alert and oriented. No focal neurological deficits. Extremities: slightly reduced strength over the left lower extremity, 4/5 as compared to 5/5 in the right lower extremity Skin: No rashes, lesions or ulcers Psychiatry: Judgement and insight appear normal. Mood & affect appropriate.      Diet Orders (From admission, onward)     Start     Ordered   12/12/23 0752  Diet regular Room service appropriate? Yes; Fluid consistency: Thin  Diet effective now  Question Answer Comment  Room service appropriate? Yes   Fluid consistency: Thin      12/12/23 0751            Objective: Vitals:    12/14/23 1657 12/14/23 2032 12/15/23 0415 12/15/23 0739  BP: (!) 141/79 126/83 (!) 141/86 (!) 143/79  Pulse: 83 86 79 86  Resp: 18 17 18 18   Temp: 98.1 F (36.7 C) 98.7 F (37.1 C) 98.3 F (36.8 C) 98.2 F (36.8 C)  TempSrc:  Oral Oral   SpO2: 96% 97% 95% 95%  Weight:      Height:        Intake/Output Summary (Last 24 hours) at 12/15/2023 0939 Last data filed at 12/15/2023 9287 Gross per 24 hour  Intake 1964.7 ml  Output 500 ml  Net 1464.7 ml   Filed Weights   12/12/23 1422  Weight: 49.6 kg    Scheduled Meds:  enoxaparin  (LOVENOX ) injection  40 mg Subcutaneous Q24H   loratadine  10 mg Oral Daily   Continuous Infusions:  piperacillin-tazobactam (ZOSYN)  IV 3.375 g (12/15/23 0532)    Nutritional status     Body mass index is 18.77 kg/m.  Data Reviewed:   CBC: Recent Labs  Lab 12/11/23 2340 12/12/23 1558 12/13/23 1100 12/14/23 0513 12/15/23 0514  WBC 13.1* 15.1* 13.5* 10.3 8.4  NEUTROABS 11.2*  --  11.5* 8.1* 5.9  HGB 12.6 12.6 11.5* 11.5* 10.6*  HCT 39.7 39.1 36.0 36.0 32.5*  MCV 92.8 90.7 91.4 91.1 89.8  PLT 306 304 336 362 325   Basic Metabolic Panel: Recent Labs  Lab 12/11/23 2340 12/12/23 1558 12/13/23 0542 12/13/23 1100 12/14/23 0513 12/15/23 0514  NA 136  --  135  --  138 139  K 5.0  --  3.3*  --  4.1 3.9  CL 101  --  99  --  106 108  CO2 23  --  19*  --  27 24  GLUCOSE 110*  --  67*  --  80 87  BUN 8  --  10  --  10 6*  CREATININE 1.01* 0.92 1.18*  --  1.03* 0.75  CALCIUM 8.5*  --  8.3*  --  8.0* 7.8*  MG  --   --   --  2.0 2.0 1.9  PHOS  --   --   --  4.3 2.3* 2.7   GFR: Estimated Creatinine Clearance: 51.2 mL/min (by C-G formula based on SCr of 0.75 mg/dL). Liver Function Tests: Recent Labs  Lab 12/13/23 1100 12/14/23 0513 12/15/23 0514  AST 15 14* 14*  ALT 10 9 7   ALKPHOS 54 47 43  BILITOT 1.4* 0.4 0.4  PROT 7.8 7.6 6.5  ALBUMIN 2.2* 2.0* 1.7*   No results for input(s): LIPASE, AMYLASE in the last 168 hours. No  results for input(s): AMMONIA in the last 168 hours. Coagulation Profile: No results for input(s): INR, PROTIME in the last 168 hours. Cardiac Enzymes: No results for input(s): CKTOTAL, CKMB, CKMBINDEX, TROPONINI in the last 168 hours. BNP (last 3 results) No results for input(s): PROBNP in the last 8760 hours. HbA1C: No results for input(s): HGBA1C in the last 72 hours. CBG: No results for input(s): GLUCAP in the last 168 hours. Lipid Profile: No results for input(s): CHOL, HDL, LDLCALC, TRIG, CHOLHDL, LDLDIRECT in the last 72 hours. Thyroid Function Tests: No results for input(s): TSH, T4TOTAL, FREET4, T3FREE, THYROIDAB in the last 72 hours. Anemia Panel: Recent Labs    12/14/23 0513  VITAMINB12 893  FOLATE 8.4  FERRITIN 172  TIBC 153*  IRON 30  RETICCTPCT 1.0   Sepsis Labs: Recent Labs  Lab 12/12/23 0250  LATICACIDVEN 1.4    Recent Results (from the past 240 hours)  Urine Culture     Status: None   Collection Time: 12/12/23  1:55 AM   Specimen: Urine, Clean Catch  Result Value Ref Range Status   Specimen Description URINE, CLEAN CATCH  Final   Special Requests NONE  Final   Culture   Final    NO GROWTH Performed at [facility], [address], [place], [place] [address]    Report Status 12/13/2023 FINAL  Final  Wet prep, genital     Status: None   Collection Time: 12/12/23  2:09 AM  Result Value Ref Range Status   Yeast Wet Prep HPF POC NONE SEEN NONE SEEN Final   Trich, Wet Prep NONE SEEN NONE SEEN Final   Clue Cells Wet Prep HPF POC NONE SEEN NONE SEEN Final   WBC, Wet Prep HPF POC <10 <10 Final   Sperm NONE SEEN  Final    Comment: Performed at [facility], [address] [place], [place] [address]  Blood culture (routine x 2)     Status: None (Preliminary result)   Collection Time: 12/12/23  2:48 AM   Specimen: BLOOD RIGHT HAND  Result Value Ref Range Status   Specimen Description BLOOD RIGHT HAND   Final   Special Requests   Final    BOTTLES DRAWN AEROBIC ONLY Blood Culture results may not be optimal due to an inadequate volume of blood received in culture bottles   Culture   Final    NO GROWTH 3 DAYS Performed at [facility], [address] [place], [place] [address]    Report Status PENDING  Incomplete  Blood culture (routine x 2)     Status: None (Preliminary result)   Collection Time: 12/12/23  2:50 AM   Specimen: Left; Blood  Result Value Ref Range Status   Specimen Description LEFT WRIST  Final   Special Requests   Final    BOTTLES DRAWN AEROBIC AND ANAEROBIC Blood Culture results may not be optimal due to an inadequate volume of blood received in culture bottles   Culture   Final    NO GROWTH 3 DAYS Performed at [facility], [address], [place], [place] [address]    Report Status PENDING  Incomplete         Radiology Studies: ECHOCARDIOGRAM COMPLETE BUBBLE STUDY Result Date: 12/14/2023    ECHOCARDIOGRAM REPORT   [name] Date of Exam: 12/14/2023 Medical Rec #:  [id number]       Height:       64.0 in Accession #:    [id number]      Weight:       109.3 lb Date of Birth:  09-Aug-1955        BSA:          1.514 m Patient Age:    68 years        BP:           129/79 mmHg Patient Gender: F               HR:           89 bpm. Exam Location:  Inpatient Procedure: 2D Echo, Cardiac Doppler and Color Doppler (Both Spectral and  Color            Flow Doppler were utilized during procedure). Indications:    Syncope  History:        [name].  Sonographer:    Benard Stallion Referring Phys: [id number] MARSHA ADA IMPRESSIONS  1. Left ventricular ejection fraction, by estimation, is 60 to 65%. The left ventricle has normal function. The left ventricle has no regional wall motion abnormalities. Left ventricular diastolic parameters are consistent with Grade I diastolic dysfunction (impaired relaxation).   2. Right ventricular systolic function is normal. The right ventricular size is normal. There is moderately elevated pulmonary artery systolic pressure. The estimated right ventricular systolic pressure is 52.9 mmHg.  3. Left atrial size was mild to moderately dilated.  4. The mitral valve is normal in structure. No evidence of mitral valve regurgitation. No evidence of mitral stenosis.  5. The aortic valve is tricuspid. Aortic valve regurgitation is not visualized. No aortic stenosis is present.  6. The inferior vena cava is normal in size with greater than 50% respiratory variability, suggesting right atrial pressure of 3 mmHg. FINDINGS  Left Ventricle: Left ventricular ejection fraction, by estimation, is 60 to 65%. The left ventricle has normal function. The left ventricle has no regional wall motion abnormalities. The left ventricular internal cavity size was normal in size. There is  no left ventricular hypertrophy. Left ventricular diastolic parameters are consistent with Grade I diastolic dysfunction (impaired relaxation). Normal left ventricular filling pressure. Right Ventricle: The right ventricular size is normal. No increase in right ventricular wall thickness. Right ventricular systolic function is normal. There is moderately elevated pulmonary artery systolic pressure. The tricuspid regurgitant velocity is 3.46 m/s, and with an assumed right atrial pressure of 5 mmHg, the estimated right ventricular systolic pressure is 52.9 mmHg. Left Atrium: Left atrial size was mild to moderately dilated. Right Atrium: Right atrial size was normal in size. Pericardium: There is no evidence of pericardial effusion. Mitral Valve: The mitral valve is normal in structure. No evidence of mitral valve regurgitation. No evidence of mitral valve stenosis. Tricuspid Valve: The tricuspid valve is normal in structure. Tricuspid valve regurgitation is not demonstrated. No evidence of tricuspid stenosis. Aortic Valve: The  aortic valve is tricuspid. Aortic valve regurgitation is not visualized. No aortic stenosis is present. Aortic valve mean gradient measures 4.0 mmHg. Aortic valve peak gradient measures 6.7 mmHg. Aortic valve area, by VTI measures 2.12 cm. Pulmonic Valve: The pulmonic valve was normal in structure. Pulmonic valve regurgitation is not visualized. No evidence of pulmonic stenosis. Aorta: The aortic root is normal in size and structure. Venous: The inferior vena cava was not well visualized. The inferior vena cava is normal in size with greater than 50% respiratory variability, suggesting right atrial pressure of 3 mmHg. IAS/Shunts: The interatrial septum is aneurysmal. No atrial level shunt detected by color flow Doppler.  LEFT VENTRICLE PLAX 2D LVIDd:         3.50 cm   Diastology LVIDs:         2.00 cm   LV e' medial:    6.96 cm/s LV PW:         0.90 cm   LV E/e' medial:  8.2 LV IVS:        0.90 cm   LV e' lateral:   8.27 cm/s LVOT diam:     1.80 cm   LV E/e' lateral: 6.9 LV SV:  56 LV SV Index:   37 LVOT Area:     2.54 cm  RIGHT VENTRICLE RV Basal diam:  3.50 cm RV Mid diam:    3.50 cm RV S prime:     17.80 cm/s TAPSE (M-mode): 2.7 cm LEFT ATRIUM             Index        RIGHT ATRIUM           Index LA diam:        3.40 cm 2.25 cm/m   RA Area:     14.00 cm LA Vol (A2C):   66.5 ml 43.94 ml/m  RA Volume:   33.50 ml  22.13 ml/m LA Vol (A4C):   37.6 ml 24.84 ml/m LA Biplane Vol: 53.3 ml 35.21 ml/m  AORTIC VALVE AV Area (Vmax):    2.27 cm AV Area (Vmean):   2.03 cm AV Area (VTI):     2.12 cm AV Vmax:           129.00 cm/s AV Vmean:          97.600 cm/s AV VTI:            0.267 m AV Peak Grad:      6.7 mmHg AV Mean Grad:      4.0 mmHg LVOT Vmax:         115.00 cm/s LVOT Vmean:        77.700 cm/s LVOT VTI:          0.222 m LVOT/AV VTI ratio: 0.83  AORTA Ao Root diam: 2.90 cm Ao Asc diam:  3.20 cm MITRAL VALVE               TRICUSPID VALVE MV Area (PHT): 4.68 cm    TR Peak grad:   47.9 mmHg MV Decel Time:  162 msec    TR Vmax:        346.00 cm/s MV E velocity: 57.00 cm/s MV A velocity: 88.30 cm/s  SHUNTS MV E/A ratio:  0.65        Systemic VTI:  0.22 m                            Systemic Diam: 1.80 cm Jerel Croitoru MD Electronically signed by Jerel Balding MD Signature Date/Time: 12/14/2023/2:33:26 PM    Final        LOS: 3 days   Time spent= 40 mins    Deliliah Room, MD [facility]  If 7PM-7AM, please contact night-coverage  12/15/2023, 9:39 AM

## 2023-12-15 NOTE — TOC CM/SW Note (Signed)
 Transition of Care [facility]) - Inpatient Brief Assessment   Patient Details  Name: Carolyn Huynh MRN: [id number] Date of Birth: 09-22-1955  Transition of Care [facility]) CM/SW Contact:    Tom-Johnson, Harvest Muskrat, RN Phone Number: 12/15/2023, 4:44 PM   Clinical Narrative:  Patient presented to the ED with Dizziness, Lt hip and Lt Shoulder pain after a Fall at home. CT Cervical Spine w/o acute Fractures. Lt Shoulder and Rt Hip X-ray  w/o acute Fractures.  CT Abdomen/Pelvis concerning for Diverticulitis v/s Incarcerated Hernias.  General sx consulted and r/o Incarcerated Hernia. Patient admitted with Diverticulitis and on IV abx.  CM spoke with patient at bedside, noted with some confusion. CM called and spoke with two of patient's children via phone, [name] and [name].   From home with daughter, [name]. Has four supportive children. Patient has hx of Schizophrenia which affects her daily living. [name] states patient does not have a PCP, has not seen a doctor in a while. States patient does not have [employer], Medicaid was applied for but some documents were requested which they did not have at the time.  Home health PT/OT is recommended, CM called Lynette with [facility], agency doing [place] this week and Arna states patient is showing active with [facility]. CM called and spoke with Admission and patient's insurance is showing inactive. CM notified Financial Navigator who has assigned patient's account to [employer].  Patient not Medically ready for discharge.  CM will continue to follow as patient progresses with care towards discharge.                 Transition of Care Asessment: Insurance and Status: Insurance coverage has been reviewed (No Insurance) Patient has primary care physician: No Home environment has been reviewed: Yes Prior level of function:: Independent Prior/Current Home Services: No current home services Social Drivers of Health Review: SDOH  reviewed no interventions necessary Readmission risk has been reviewed: Yes Transition of care needs: transition of care needs identified, TOC will continue to follow

## 2023-12-16 NOTE — Progress Notes (Signed)
 MEWS Progress Note  Patient Details Name: Carolyn Huynh MRN: [id number] DOB: September 22, 1955 Today's Date: 12/16/2023   MEWS Flowsheet Documentation:  Assess: MEWS Score Temp: 97.8 F (36.6 C) BP: 124/65 MAP (mmHg): 82 Pulse Rate: 87 ECG Heart Rate: 81 Resp: 18 Level of Consciousness: Alert SpO2: 92 % O2 Device: Room Air Assess: MEWS Score MEWS Temp: 0 MEWS Systolic: 0 MEWS Pulse: 0 MEWS RR: 0 MEWS LOC: 0 MEWS Score: 0 MEWS Score Color: Green Assess: SIRS CRITERIA SIRS Temperature : 0 SIRS Respirations : 0 SIRS Pulse: 0 SIRS WBC: 0 SIRS Score Sum : 0 Assess: if the MEWS score is Yellow or Red Were vital signs accurate and taken at a resting state?: Yes Does the patient meet 2 or more of the SIRS criteria?: No MEWS guidelines implemented : Yes, yellow Treat MEWS Interventions: Considered administering scheduled or prn medications/treatments as ordered Take Vital Signs Increase Vital Sign Frequency : Yellow: Q2hr x1, continue Q4hrs until patient remains green for 12hrs Escalate MEWS: Escalate: Yellow: Discuss with charge nurse and consider notifying provider and/or RRT        [name] 12/16/2023, 4:07 AM

## 2023-12-16 NOTE — Progress Notes (Addendum)
 Physical Therapy Treatment Patient Details Name: Carolyn Huynh MRN: [id number] DOB: 09-Oct-1955 Today's Date: 12/16/2023   History of Present Illness 42F admitted after fall in kitchen; presented with weakness; working dx of syncope, diverticulitis; h/o dementia, schizophrenia, alcohol abuse, and recurrent right pneumothoraces s/p pleurodesis p/w fever and leukocytosis and CT abd c/f diverticulitis vs incarcerated hernia.    PT Comments  Pt making excellent progress towards her physical therapy goals, with increased ease with transfers and ambulation. Pt did have episode of diarrhea at beginning of session and assisted on [facility]; pt reporting lower abdominal pain and RN notified. Pt ambulating 200 ft with a Rollator and CGA. Pt states, this feels a lot better today. D/c of HHPT remains appropriate.    If plan is discharge home, recommend the following: A little help with bathing/dressing/bathroom;Assistance with cooking/housework;Help with stairs or ramp for entrance;Assist for transportation   Can travel by private vehicle        Equipment Recommendations  Rollator (4 wheels)    Recommendations for Other Services       Precautions / Restrictions Precautions Precautions: Fall Restrictions Weight Bearing Restrictions Per Provider Order: No     Mobility  Bed Mobility Overal bed mobility: Needs Assistance Bed Mobility: Supine to Sit Rolling: Supervision         General bed mobility comments: No physical assist required, exiting towards left side of bed    Transfers Overall transfer level: Needs assistance Equipment used: Rollator (4 wheels) Transfers: Sit to/from Stand, Bed to chair/wheelchair/BSC Sit to Stand: Contact guard assist Stand pivot transfers: Contact guard assist         General transfer comment: CGA for safety    Ambulation/Gait Ambulation/Gait assistance: Contact guard assist Gait Distance (Feet): 200 Feet Assistive device: Rollator (4 wheels) Gait  Pattern/deviations: Step-through pattern, Trunk flexed, Decreased stride length Gait velocity: decreased     General Gait Details: Pt self cueing for upright posture, overall smooth gait   Stairs             Wheelchair Mobility     Tilt Bed    Modified Rankin (Stroke Patients Only)       Balance Overall balance assessment: Mild deficits observed, not formally tested                                          Communication Communication Communication: No apparent difficulties  Cognition Arousal: Alert Behavior During Therapy: WFL for tasks assessed/performed   PT - Cognitive impairments: No family/caregiver present to determine baseline                         Following commands: Intact      Cueing Cueing Techniques: Verbal cues  Exercises Other Exercises Other Exercises: Supine: bilateral knee to chest stretch x 1 min    General Comments        Pertinent Vitals/Pain Pain Assessment Pain Assessment: Faces Faces Pain Scale: Hurts a little bit Pain Location: abdominal discomfort, R lower back pain Pain Descriptors / Indicators: Discomfort Pain Intervention(s): Monitored during session, Other (comment) (RN notified)    Home Living                          Prior Function            PT Goals (current goals can  now be found in the care plan section) Acute Rehab PT Goals Patient Stated Goal: feel better PT Goal Formulation: With patient Time For Goal Achievement: 12/27/23 Potential to Achieve Goals: Good Progress towards PT goals: Progressing toward goals    Frequency    Min 2X/week      PT Plan      Co-evaluation              AM-PAC PT 6 Clicks Mobility   Outcome Measure  Help needed turning from your back to your side while in a flat bed without using bedrails?: A Little Help needed moving from lying on your back to sitting on the side of a flat bed without using bedrails?: A Little Help  needed moving to and from a bed to a chair (including a wheelchair)?: A Little Help needed standing up from a chair using your arms (e.g., wheelchair or bedside chair)?: A Little Help needed to walk in hospital room?: A Little Help needed climbing 3-5 steps with a railing? : A Little 6 Click Score: 18    End of Session Equipment Utilized During Treatment: Gait belt Activity Tolerance: Patient tolerated treatment well Patient left: with call bell/phone within reach;in chair;with chair alarm set Nurse Communication: Mobility status PT Visit Diagnosis: Unsteadiness on feet (R26.81);History of falling (Z91.81)     Time: 8875-8845 PT Time Calculation (min) (ACUTE ONLY): 30 min  Charges:    $Therapeutic Activity: 23-37 mins PT General Charges $$ ACUTE PT VISIT: 1 Visit                     Aleck Huynh, PT, DPT Acute Rehabilitation Services Office [phone]    Carolyn Huynh 12/16/2023, 1:28 PM

## 2023-12-16 NOTE — Progress Notes (Signed)
 PROGRESS NOTE    Carolyn Huynh  FMW:[id number] DOB: 04-21-1956 DOA: 12/11/2023 PCP: Patient, [name]   Brief Narrative:   68 year old AAF past medical history significant for but not limited to to dementia, schizophrenia, alcohol abuse, history of recurrent right pneumothorax status post pleurodesis presented with fever, leukocytosis and abdominal discomfort. CT scan of the abdomen and pelvis was done was concerning for diverticulitis versus incarcerated hernias.  General surgery consulted and incarcerated abdominal hernia has been ruled out.  Patient is currently being treated for possible diverticulitis with intravenous antibiotics.  PT/OT for physical and likely discharge home with home health services in the next 24 hours.  Assessment & Plan:  Principal Problem:   Acute diverticulitis Active Problems:   Diverticulitis    Syncope, POA: Concern for syncope versus unwitnessed fall.  No evidence of any abnormal rhythms on cardiac telemetry.  Continue to monitor closely.  May need outpatient ZIO/Holter monitoring on discharge.  No episodes of syncope in the hospital.  Continue PT/OT evaluation. EF of 60 to 65% with grade 1 diastolic dysfunction on echocardiogram which was done on 12/14/2023 and no significant valvular abnormality seen.  Acute diverticulitis/possible incarcerated abdominal incisional hernia/left gluteal cleft cellulitis, POA: Patient is currently intravenous antibiotics.  She has been evaluated by general surgery and upon review of the CAT scan, they think that patient only has omental fat incarcerated without the bowel so there is no need for any surgical intervention.  Will continue to monitor closely.  [name].  Continue with the diet. Intravenous fluids stopped on 12/15/2023.  CKD stage IIIa, POA: No acute issues.  Continue to monitor urine function closely.  Avoid nephrotoxic agents.  Normocytic anemia, POA: No acute issues.  No  evidence of acute bleeding.  Follow-up H&H closely.  Physical deconditioning, POA: PT/OT on board.  [name].  Case management consulted as well.  Moderate protein calorie malnutrition, POA: As evidenced by low albumin in the setting of pelvic deconditioning and multiple medical comorbidities.  DVT prophylaxis: enoxaparin  (LOVENOX ) injection 40 mg Start: 12/12/23 0745     Code Status: Full Code Family Communication: None present at the bedside Status is: Inpatient Remains inpatient appropriate because: Physical conditioning, syncope    Subjective: She said that she still has some lower quadrant abdominal pain. She said she will talk to her daughter about coming home and will let us  know. No acute issues overnight.   Examination:  General exam: Appears calm and comfortable  Respiratory system: Clear to auscultation. Respiratory effort normal. Cardiovascular system: S1 & S2 heard, RRR. No JVD, murmurs, rubs, gallops or clicks. No pedal edema. Gastrointestinal system: Abdomen is nondistended, soft and nontender. No organomegaly or masses felt. Normal bowel sounds heard. Central nervous system: Alert and oriented. No focal neurological deficits. Extremities: slightly reduced strength over the left lower extremity, 4/5 as compared to 5/5 in the right lower extremity Skin: No rashes, lesions or ulcers Psychiatry: Judgement and insight appear normal. Mood & affect appropriate.      Diet Orders (From admission, onward)     Start     Ordered   12/12/23 0752  Diet regular Room service appropriate? Yes; Fluid consistency: Thin  Diet effective now       Question Answer Comment  Room service appropriate? Yes   Fluid consistency: Thin      12/12/23 0751  Objective: Vitals:   12/15/23 2036 12/15/23 2324 12/16/23 0417 12/16/23 0810  BP: (!) 142/84 124/65 129/69 126/81  Pulse: 81 87 78 91  Resp: (!) 37 18 18   Temp: 99 F (37.2 C)  97.8 F (36.6 C) 98.4 F (36.9 C) 98.6 F (37 C)  TempSrc: Oral   Oral  SpO2: 100% 92% 98% 93%  Weight:      Height:        Intake/Output Summary (Last 24 hours) at 12/16/2023 1217 Last data filed at 12/16/2023 1000 Gross per 24 hour  Intake 480 ml  Output 650 ml  Net -170 ml   Filed Weights   12/12/23 1422  Weight: 49.6 kg    Scheduled Meds:  enoxaparin  (LOVENOX ) injection  40 mg Subcutaneous Q24H   loratadine  10 mg Oral Daily   Continuous Infusions:  piperacillin-tazobactam (ZOSYN)  IV 3.375 g (12/16/23 0646)    Nutritional status     Body mass index is 18.77 kg/m.  Data Reviewed:   CBC: Recent Labs  Lab 12/11/23 2340 12/12/23 1558 12/13/23 1100 12/14/23 0513 12/15/23 0514  WBC 13.1* 15.1* 13.5* 10.3 8.4  NEUTROABS 11.2*  --  11.5* 8.1* 5.9  HGB 12.6 12.6 11.5* 11.5* 10.6*  HCT 39.7 39.1 36.0 36.0 32.5*  MCV 92.8 90.7 91.4 91.1 89.8  PLT 306 304 336 362 325   Basic Metabolic Panel: Recent Labs  Lab 12/11/23 2340 12/12/23 1558 12/13/23 0542 12/13/23 1100 12/14/23 0513 12/15/23 0514  NA 136  --  135  --  138 139  K 5.0  --  3.3*  --  4.1 3.9  CL 101  --  99  --  106 108  CO2 23  --  19*  --  27 24  GLUCOSE 110*  --  67*  --  80 87  BUN 8  --  10  --  10 6*  CREATININE 1.01* 0.92 1.18*  --  1.03* 0.75  CALCIUM 8.5*  --  8.3*  --  8.0* 7.8*  MG  --   --   --  2.0 2.0 1.9  PHOS  --   --   --  4.3 2.3* 2.7   GFR: Estimated Creatinine Clearance: 51.2 mL/min (by C-G formula based on SCr of 0.75 mg/dL). Liver Function Tests: Recent Labs  Lab 12/13/23 1100 12/14/23 0513 12/15/23 0514  AST 15 14* 14*  ALT 10 9 7   ALKPHOS 54 47 43  BILITOT 1.4* 0.4 0.4  PROT 7.8 7.6 6.5  ALBUMIN 2.2* 2.0* 1.7*   No results for input(s): LIPASE, AMYLASE in the last 168 hours. No results for input(s): AMMONIA in the last 168 hours. Coagulation Profile: No results for input(s): INR, PROTIME in the last 168 hours. Cardiac Enzymes: No results for  input(s): CKTOTAL, CKMB, CKMBINDEX, TROPONINI in the last 168 hours. BNP (last 3 results) No results for input(s): PROBNP in the last 8760 hours. HbA1C: No results for input(s): HGBA1C in the last 72 hours. CBG: No results for input(s): GLUCAP in the last 168 hours. Lipid Profile: No results for input(s): CHOL, HDL, LDLCALC, TRIG, CHOLHDL, LDLDIRECT in the last 72 hours. Thyroid Function Tests: No results for input(s): TSH, T4TOTAL, FREET4, T3FREE, THYROIDAB in the last 72 hours. Anemia Panel: Recent Labs    12/14/23 0513  VITAMINB12 893  FOLATE 8.4  FERRITIN 172  TIBC 153*  IRON 30  RETICCTPCT 1.0   Sepsis Labs: Recent Labs  Lab 12/12/23 0250  LATICACIDVEN 1.4  Recent Results (from the past 240 hours)  Urine Culture     Status: None   Collection Time: 12/12/23  1:55 AM   Specimen: Urine, Clean Catch  Result Value Ref Range Status   Specimen Description URINE, CLEAN CATCH  Final   Special Requests NONE  Final   Culture   Final    NO GROWTH Performed at [facility], [address], [place], [place] [address]    Report Status 12/13/2023 FINAL  Final  Wet prep, genital     Status: None   Collection Time: 12/12/23  2:09 AM  Result Value Ref Range Status   Yeast Wet Prep HPF POC NONE SEEN NONE SEEN Final   Trich, Wet Prep NONE SEEN NONE SEEN Final   Clue Cells Wet Prep HPF POC NONE SEEN NONE SEEN Final   WBC, Wet Prep HPF POC <10 <10 Final   Sperm NONE SEEN  Final    Comment: Performed at [facility], [address] [place], [place] [address]  Blood culture (routine x 2)     Status: None (Preliminary result)   Collection Time: 12/12/23  2:48 AM   Specimen: BLOOD RIGHT HAND  Result Value Ref Range Status   Specimen Description BLOOD RIGHT HAND  Final   Special Requests   Final    BOTTLES DRAWN AEROBIC ONLY Blood Culture results may not be optimal due to an inadequate volume of blood received in culture bottles    Culture   Final    NO GROWTH 4 DAYS Performed at [facility], [address], [place], [place] [address]    Report Status PENDING  Incomplete  Blood culture (routine x 2)     Status: None (Preliminary result)   Collection Time: 12/12/23  2:50 AM   Specimen: Left; Blood  Result Value Ref Range Status   Specimen Description LEFT WRIST  Final   Special Requests   Final    BOTTLES DRAWN AEROBIC AND ANAEROBIC Blood Culture results may not be optimal due to an inadequate volume of blood received in culture bottles   Culture   Final    NO GROWTH 4 DAYS Performed at [facility], [address] [place], [place] [address]    Report Status PENDING  Incomplete         Radiology Studies: No results found.      LOS: 4 days   Time spent= 40 mins    Deliliah Room, MD [facility]  If 7PM-7AM, please contact night-coverage  12/16/2023, 12:17 PM

## 2023-12-17 LAB — CULTURE, BLOOD (ROUTINE X 2)
Culture: NO GROWTH
Culture: NO GROWTH

## 2023-12-17 MED ORDER — OXYCODONE HCL 5 MG PO TABS: 5.0000 mg | ORAL_TABLET | Freq: Four times a day (QID) | ORAL | 0 refills | Status: DC | PRN

## 2023-12-17 MED ORDER — ACETAMINOPHEN 325 MG PO TABS: 650.0000 mg | ORAL_TABLET | Freq: Four times a day (QID) | ORAL | 0 refills | Status: DC | PRN

## 2023-12-17 MED ORDER — LORATADINE 10 MG PO TABS: 10.0000 mg | ORAL_TABLET | Freq: Every day | ORAL | 0 refills | Status: DC | PRN

## 2023-12-17 NOTE — Plan of Care (Signed)

## 2023-12-17 NOTE — Plan of Care (Signed)
 Spoke with Pt's daughter Remonia Otte who's at home waiting for her mother.

## 2023-12-17 NOTE — Discharge Instructions (Signed)
 Social Security Administration [id number] [employer] reviews Social security office in [place], [employer]  Website Directions Reviews [employer] facility dealing with Social Security matters, such as card replacement. Address: [address] [place], [place] [address] Phone: [phone] Hours:  Open ? Closes 4?PM

## 2023-12-17 NOTE — TOC Transition Note (Addendum)
 Transition of Care [facility]) - Discharge Note   Patient Details  Name: Carolyn Huynh MRN: [id number] Date of Birth: 12/15/1955  Transition of Care [facility]) CM/SW Contact:  Corean JAYSON Canary, RN Phone Number: 12/17/2023, 8:52 AM   Clinical Narrative:    Physician will be discharging today, the patient does not feel ready to go home. Called daughter [name] to discuss discharge planning. Left confidential voice message to call this RNCM back. The patient is currently uninsured., but has filed for [employer]. Will await collateral information from daughter 86 resent message to daughter requesting a call back to discuss discharge planning  1130 [name] called this RNCM back. Her mother lives with her. She is concerned about  the decisions she has been making however she does say that shehas been this way for years Went to talk to the patient at the bedside. She is agreeable to a walker with seat, ordered from [employer]. If she is stable enough for discharge, she is ok with it. She states her daughter lives with her and she is planning on going back to [place] with her husband. She says he is in and out, when asks if he lives with her. Her husband is not listed in contacts. She states it would be ok to get home health if she needs it. Encouraged her to make a plan with her daughters  for health care etc.   1310 Daughter cannot find a ride to come get her, Preparing taxi voucher to be sent home.  Patient consented to give daughters information.  Taxi voucher given to Diplomatic Services operational officer  Patient Goals and CMS Choice            Discharge Placement               Home c/o daughter      Discharge Plan and Services Additional resources added to the After Visit Summary for                                       Social Drivers of Health (SDOH) Interventions SDOH Screenings   Food Insecurity: No Food Insecurity (12/13/2023)  Housing: Unknown (12/13/2023)  Transportation Needs: Unmet  Transportation Needs (12/13/2023)  Utilities: Not At Risk (12/13/2023)  Alcohol Screen: Low Risk  (09/09/2021)  Social Connections: Unknown (12/13/2023)  Tobacco Use: Low Risk  (12/11/2023)     Readmission Risk Interventions    12/15/2023    4:42 PM  Readmission Risk Prevention Plan  Post Dischage Appt Complete  Medication Screening Complete  Transportation Screening Complete

## 2023-12-17 NOTE — Discharge Summary (Signed)
 Physician Discharge Summary   Patient: Carolyn Huynh MRN: [id number] DOB: [birth year]  Admit date:     12/11/2023  Discharge date: 12/17/23  Discharge Physician: Deliliah Room   PCP: One week follow up.  Recommendations at discharge:    Follow up with your PCP in one week. Continue taking meds as prescribed Return to ED if you develop chest pain, fever or abdominal pain.  Discharge Diagnoses: Principal Problem:   Acute diverticulitis Active Problems:   Diverticulitis   Hospital Course:   Syncope, POA: Concern for syncope versus unwitnessed fall.  No evidence of any abnormal rhythms on cardiac telemetry.  May need outpatient ZIO/Holter monitoring on discharge.  No episodes of syncope in the hospital.  Continue PT/OT evaluation. EF of 60 to 65% with grade 1 diastolic dysfunction on echocardiogram which was done on 12/14/2023 and no significant valvular abnormality seen.   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  She has been evaluated by general surgery and upon review of the CAT scan, they think that patient only has omental fat incarcerated without the bowel so there is no need for any surgical intervention.  [name].  Continue with the diet. Intravenous fluids stopped on 12/15/2023.   CKD stage IIIa, POA: No acute issues.  Continue to monitor urine function closely.  Avoid nephrotoxic agents.   Normocytic anemia, POA: No acute issues.  No evidence of acute bleeding.     Physical deconditioning, POA: PT/OT on board.  [name].  Case management consulted as well.   Moderate protein calorie malnutrition, POA: As evidenced by low albumin in the setting of pelvic deconditioning and multiple medical comorbidities.  Disposition: Home with HHS.       Consultants: GI Procedures performed:  None  Disposition: Home health Diet recommendation:  Regular diet DISCHARGE MEDICATION: Allergies as of 12/17/2023       Reactions   Aspirin Shortness Of Breath   Shellfish Allergy Anaphylaxis   Ivp Dye [iodinated Contrast Media] Other (See Comments)   Chest pain        Medication List     TAKE these medications    acetaminophen  325 MG tablet Commonly known as: TYLENOL  Take 2 tablets (650 mg total) by mouth every 6 (six) hours as needed for moderate pain (pain score 4-6), headache or fever.   loratadine  10 MG tablet Commonly known as: CLARITIN  Take 1 tablet (10 mg total) by mouth daily as needed for allergies.   oxyCODONE  5 MG immediate release tablet Commonly known as: Oxy IR/ROXICODONE  Take 1 tablet (5 mg total) by mouth every 6 (six) hours as needed for severe pain (pain score 7-10).               Durable Medical Equipment  (From admission, onward)           Start     Ordered   12/17/23 1218  For home use only DME 4 wheeled rolling walker with seat  Once       Question:  Patient needs a walker to treat with the following condition  Answer:  Weakness   12/17/23 1217            Follow-up Information     [facility] Follow up.   Specialty: Family Medicine Why: need primary care doctor Contact information: [address], [address]  [place]  [address] [phone] Additional information: [address]  [address], [place] [address]    - Parking lot with [employer], [employer], [employer]; Next to [facility]   - [facility] shopping - Off of [address]                 Discharge Exam: Filed Weights   12/12/23 1422  Weight: 49.6 kg   Constitutional: NAD, calm, comfortable Eyes: PERRL, lids and conjunctivae normal ENMT: Mucous membranes are moist. Posterior pharynx clear of any exudate or lesions.Normal dentition.  Neck: normal, supple, no masses, no  thyromegaly Respiratory: clear to auscultation bilaterally, no wheezing, no crackles. Normal respiratory effort. No accessory muscle use.  Cardiovascular: Regular rate and rhythm, no murmurs / rubs / gallops. No extremity edema. 2+ pedal pulses. No carotid bruits.  Abdomen: no tenderness, no masses palpated. No hepatosplenomegaly. Bowel sounds positive.  Musculoskeletal: no clubbing / cyanosis. No joint deformity upper and lower extremities. Good ROM, no contractures. Normal muscle tone.  Skin: no rashes, lesions, ulcers. No induration Neurologic: CN 2-12 grossly intact. Sensation intact, DTR normal. Strength 5/5 x all 4 extremities.  Psychiatric: Normal judgment and insight. Alert and oriented x 3. Normal mood.    Condition at discharge: good  The results of significant diagnostics from this hospitalization (including imaging, microbiology, ancillary and laboratory) are listed below for reference.   Imaging Studies: ECHOCARDIOGRAM COMPLETE BUBBLE STUDY Result Date: 12/14/2023    ECHOCARDIOGRAM REPORT   [name] Date of Exam: 12/14/2023 Medical Rec #:  [id number]       Height:       64.0 in Accession #:    [id number]      Weight:       109.3 lb Date of Birth:  August 12, 1955        BSA:          1.514 m Patient Age:    68 years        BP:           129/79 mmHg Patient Gender: F               HR:           89 bpm. Exam Location:  Inpatient Procedure: 2D Echo, Cardiac Doppler and Color Doppler (Both Spectral and Color            Flow Doppler were utilized during procedure). Indications:    Syncope  History:        [name].  Sonographer:    Benard Stallion Referring Phys: [id number] MARSHA ADA IMPRESSIONS  1. Left ventricular ejection fraction, by estimation, is 60 to 65%. The left ventricle has normal function. The left ventricle has no regional wall motion abnormalities. Left ventricular diastolic parameters are consistent with Grade I  diastolic dysfunction (impaired relaxation).  2. Right ventricular systolic function is normal. The right ventricular size is normal. There is moderately elevated pulmonary artery systolic pressure. The estimated right ventricular systolic pressure is 52.9 mmHg.  3. Left atrial size was mild to moderately dilated.  4. The mitral valve is normal in structure. No evidence of mitral valve regurgitation. No evidence of mitral stenosis.  5. The aortic valve is tricuspid. Aortic valve regurgitation is not visualized. No aortic stenosis is present.  6. The inferior vena cava is normal in size with greater than 50% respiratory variability, suggesting right atrial pressure of 3  mmHg. FINDINGS  Left Ventricle: Left ventricular ejection fraction, by estimation, is 60 to 65%. The left ventricle has normal function. The left ventricle has no regional wall motion abnormalities. The left ventricular internal cavity size was normal in size. There is  no left ventricular hypertrophy. Left ventricular diastolic parameters are consistent with Grade I diastolic dysfunction (impaired relaxation). Normal left ventricular filling pressure. Right Ventricle: The right ventricular size is normal. No increase in right ventricular wall thickness. Right ventricular systolic function is normal. There is moderately elevated pulmonary artery systolic pressure. The tricuspid regurgitant velocity is 3.46 m/s, and with an assumed right atrial pressure of 5 mmHg, the estimated right ventricular systolic pressure is 52.9 mmHg. Left Atrium: Left atrial size was mild to moderately dilated. Right Atrium: Right atrial size was normal in size. Pericardium: There is no evidence of pericardial effusion. Mitral Valve: The mitral valve is normal in structure. No evidence of mitral valve regurgitation. No evidence of mitral valve stenosis. Tricuspid Valve: The tricuspid valve is normal in structure. Tricuspid valve regurgitation is not demonstrated. No evidence  of tricuspid stenosis. Aortic Valve: The aortic valve is tricuspid. Aortic valve regurgitation is not visualized. No aortic stenosis is present. Aortic valve mean gradient measures 4.0 mmHg. Aortic valve peak gradient measures 6.7 mmHg. Aortic valve area, by VTI measures 2.12 cm. Pulmonic Valve: The pulmonic valve was normal in structure. Pulmonic valve regurgitation is not visualized. No evidence of pulmonic stenosis. Aorta: The aortic root is normal in size and structure. Venous: The inferior vena cava was not well visualized. The inferior vena cava is normal in size with greater than 50% respiratory variability, suggesting right atrial pressure of 3 mmHg. IAS/Shunts: The interatrial septum is aneurysmal. No atrial level shunt detected by color flow Doppler.  LEFT VENTRICLE PLAX 2D LVIDd:         3.50 cm   Diastology LVIDs:         2.00 cm   LV e' medial:    6.96 cm/s LV PW:         0.90 cm   LV E/e' medial:  8.2 LV IVS:        0.90 cm   LV e' lateral:   8.27 cm/s LVOT diam:     1.80 cm   LV E/e' lateral: 6.9 LV SV:         56 LV SV Index:   37 LVOT Area:     2.54 cm  RIGHT VENTRICLE RV Basal diam:  3.50 cm RV Mid diam:    3.50 cm RV S prime:     17.80 cm/s TAPSE (M-mode): 2.7 cm LEFT ATRIUM             Index        RIGHT ATRIUM           Index LA diam:        3.40 cm 2.25 cm/m   RA Area:     14.00 cm LA Vol (A2C):   66.5 ml 43.94 ml/m  RA Volume:   33.50 ml  22.13 ml/m LA Vol (A4C):   37.6 ml 24.84 ml/m LA Biplane Vol: 53.3 ml 35.21 ml/m  AORTIC VALVE AV Area (Vmax):    2.27 cm AV Area (Vmean):   2.03 cm AV Area (VTI):     2.12 cm AV Vmax:           129.00 cm/s AV Vmean:          97.600 cm/s AV  VTI:            0.267 m AV Peak Grad:      6.7 mmHg AV Mean Grad:      4.0 mmHg LVOT Vmax:         115.00 cm/s LVOT Vmean:        77.700 cm/s LVOT VTI:          0.222 m LVOT/AV VTI ratio: 0.83  AORTA Ao Root diam: 2.90 cm Ao Asc diam:  3.20 cm MITRAL VALVE               TRICUSPID VALVE MV Area (PHT): 4.68 cm     TR Peak grad:   47.9 mmHg MV Decel Time: 162 msec    TR Vmax:        346.00 cm/s MV E velocity: 57.00 cm/s MV A velocity: 88.30 cm/s  SHUNTS MV E/A ratio:  0.65        Systemic VTI:  0.22 m                            Systemic Diam: 1.80 cm Jerel Croitoru MD Electronically signed by Jerel Balding MD Signature Date/Time: 12/14/2023/2:33:26 PM    Final    CT CHEST ABDOMEN PELVIS WO CONTRAST Result Date: 12/12/2023 CLINICAL DATA:  Blunt polytrauma.  Iodinated contrast allergy. EXAM: CT CHEST, ABDOMEN AND PELVIS WITHOUT CONTRAST TECHNIQUE: Multidetector CT imaging of the chest, abdomen and pelvis was performed following the standard protocol without IV contrast. RADIATION DOSE REDUCTION: This exam was performed according to the departmental dose-optimization program which includes automated exposure control, adjustment of the mA and/or kV according to patient size and/or use of iterative reconstruction technique. COMPARISON:  Chest radiograph 12/11/2023 and CT abdomen pelvis 08/25/2018 FINDINGS: CT CHEST FINDINGS Cardiovascular: Small pericardial effusion. Normal heart size. Normal caliber thoracic aorta. Aortic atherosclerotic calcification. Evaluation for vascular injury is limited without IV contrast. Mediastinum/Nodes: Trachea and esophagus are unremarkable. No mediastinal hematoma. Shotty mediastinal lymph nodes are likely reactive. Lungs/Pleura: Emphysema. Chronic scarring in the anterior left upper lobe. Possible superimposed infection in the left upper lobe in the areas of scarring versus progressive fibrosis compared to 2019. Postoperative change in the right apex. No pleural effusion or pneumothorax. Musculoskeletal: No acute fracture. 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 CT ABDOMEN PELVIS FINDINGS Hepatobiliary: Evaluation for visceral organ injury is limited without IV contrast. Unremarkable noncontrast appearance of the liver and biliary tree. Pancreas: Unremarkable.  Spleen: Unremarkable. Adrenals/Urinary Tract: No adrenal hemorrhage or renal injury identified. Bladder is unremarkable. Stomach/Bowel: No bowel obstruction. Wall thickening about the sigmoid colon in an area of diverticulosis (series 7/image 445). Minimal if any adjacent stranding. No adjacent fluid. Stomach is within normal limits. Vascular/Lymphatic: Aortic atherosclerotic calcification. No lymphadenopathy. Reproductive: Calcified fibroid in the uterus.  No adnexal mass. Other: No free intraperitoneal fluid or air. Stranding and fluid within the 2 periumbilical ventral abdominal wall hernias. Partially visualized soft tissue thickening and edema/stranding in the left gluteal soft tissues near the gluteal cleft (series 3/image 119). Correlate for cellulitis. Musculoskeletal: No acute fracture. IMPRESSION: 1. No evidence of acute traumatic injury in the chest, abdomen, or pelvis. 2. Chronic scarring in the anterior left upper lobe. Possible superimposed infection in the areas of scarring versus progressive fibrosis compared to 2019. 3. Wall thickening about the sigmoid colon in an area of diverticulosis. Minimal if any adjacent stranding. Findings may represent early/mild diverticulitis. Primary differential  consideration is malignancy and correlation with colonoscopy is recommended when clinically appropriate. 4. Stranding and fluid within the 2 periumbilical ventral abdominal wall hernias. Correlate for strangulation/incarceration. 5. Partially visualized soft tissue thickening and edema/stranding in the left gluteal soft tissues near the gluteal cleft. Correlate for cellulitis. Aortic Atherosclerosis (ICD10-I70.0) and Emphysema (ICD10-J43.9). Electronically Signed   By: Norman Gatlin M.D.   On: 12/12/2023 01:45   CT Head Wo Contrast Result Date: 12/11/2023 CLINICAL DATA:  Dementia, head trauma best obtainable images as patient had difficulty following commands and holding still EXAM: CT HEAD WITHOUT  CONTRAST CT CERVICAL SPINE WITHOUT CONTRAST TECHNIQUE: Multidetector CT imaging of the head and cervical spine was performed following the standard protocol without intravenous contrast. Multiplanar CT image reconstructions of the cervical spine were also generated. RADIATION DOSE REDUCTION: This exam was performed according to the departmental dose-optimization program which includes automated exposure control, adjustment of the mA and/or kV according to patient size and/or use of iterative reconstruction technique. COMPARISON:  None Available. FINDINGS: CT HEAD FINDINGS Brain: No intracranial hemorrhage, mass effect, or evidence of acute infarct. No hydrocephalus. No extra-axial fluid collection. Mild cerebral atrophy and advanced chronic small vessel ischemic disease. Vascular: No hyperdense vessel. Intracranial arterial calcification. Skull: No fracture or focal lesion. Sinuses/Orbits: No acute finding. Other: None. CT CERVICAL SPINE FINDINGS Alignment: No evidence of traumatic malalignment. Skull base and vertebrae: No acute fracture. No primary bone lesion or focal pathologic process. Soft tissues and spinal canal: No prevertebral fluid or swelling. No visible canal hematoma. Disc levels: Mild multilevel spondylosis. No severe spinal canal narrowing. Upper chest: Reported separately. Other: Carotid calcification. IMPRESSION: 1. No acute intracranial abnormality. 2. No acute fracture in the cervical spine. Electronically Signed   By: Norman Gatlin M.D.   On: 12/11/2023 22:50   CT Cervical Spine Wo Contrast Result Date: 12/11/2023 CLINICAL DATA:  Dementia, head trauma best obtainable images as patient had difficulty following commands and holding still EXAM: CT HEAD WITHOUT CONTRAST CT CERVICAL SPINE WITHOUT CONTRAST TECHNIQUE: Multidetector CT imaging of the head and cervical spine was performed following the standard protocol without intravenous contrast. Multiplanar CT image reconstructions of the  cervical spine were also generated. RADIATION DOSE REDUCTION: This exam was performed according to the departmental dose-optimization program which includes automated exposure control, adjustment of the mA and/or kV according to patient size and/or use of iterative reconstruction technique. COMPARISON:  None Available. FINDINGS: CT HEAD FINDINGS Brain: No intracranial hemorrhage, mass effect, or evidence of acute infarct. No hydrocephalus. No extra-axial fluid collection. Mild cerebral atrophy and advanced chronic small vessel ischemic disease. Vascular: No hyperdense vessel. Intracranial arterial calcification. Skull: No fracture or focal lesion. Sinuses/Orbits: No acute finding. Other: None. CT CERVICAL SPINE FINDINGS Alignment: No evidence of traumatic malalignment. Skull base and vertebrae: No acute fracture. No primary bone lesion or focal pathologic process. Soft tissues and spinal canal: No prevertebral fluid or swelling. No visible canal hematoma. Disc levels: Mild multilevel spondylosis. No severe spinal canal narrowing. Upper chest: Reported separately. Other: Carotid calcification. IMPRESSION: 1. No acute intracranial abnormality. 2. No acute fracture in the cervical spine. Electronically Signed   By: Norman Gatlin M.D.   On: 12/11/2023 22:50   DG Chest 1 View Result Date: 12/11/2023 CLINICAL DATA:  Status post fall. EXAM: CHEST  1 VIEW COMPARISON:  August 15, 2019 FINDINGS: The heart size and mediastinal contours are within normal limits. Mild chronic appearing increased interstitial lung markings are seen with mild scarring and/or atelectasis noted  along the periphery of the mid left lung. Surgical sutures are seen overlying the bilateral upper lobes. No pleural effusion or pneumothorax is identified. Multilevel degenerative changes are present throughout the thoracic spine. IMPRESSION: Postoperative changes and chronic appearing increased interstitial lung markings with mild scarring and/or  atelectasis along the periphery of the mid left lung. Electronically Signed   By: Suzen Dials M.D.   On: 12/11/2023 22:37   DG Shoulder Left Result Date: 12/11/2023 CLINICAL DATA:  Status post fall. EXAM: LEFT SHOULDER - 2+ VIEW COMPARISON:  None Available. FINDINGS: There is no evidence of an acute fracture or dislocation. Mild degenerative changes are seen involving the left acromioclavicular joint. Soft tissues are unremarkable. IMPRESSION: Mild degenerative changes without evidence of an acute osseous abnormality. Electronically Signed   By: Suzen Dials M.D.   On: 12/11/2023 22:34   DG HIP UNILAT WITH PELVIS 2-3 VIEWS RIGHT Result Date: 12/11/2023 CLINICAL DATA:  Status post fall. EXAM: DG HIP (WITH OR WITHOUT PELVIS) 2-3V RIGHT COMPARISON:  None Available. FINDINGS: There is no evidence of an acute hip fracture or dislocation. Mild degenerative changes are seen involving both hips in the form of joint space narrowing and acetabular sclerosis. IMPRESSION: Mild degenerative changes without evidence of an acute osseous abnormality. Electronically Signed   By: Suzen Dials M.D.   On: 12/11/2023 22:32   DG HIP UNILAT WITH PELVIS 2-3 VIEWS LEFT Result Date: 12/11/2023 CLINICAL DATA:  Status post fall. EXAM: DG HIP (WITH OR WITHOUT PELVIS) 2-3V LEFT COMPARISON:  None Available. FINDINGS: There is no evidence of hip fracture or dislocation. There is no evidence of arthropathy or other focal bone abnormality. IMPRESSION: Negative. Electronically Signed   By: Suzen Dials M.D.   On: 12/11/2023 22:31    Microbiology: Results for orders placed or performed during the hospital encounter of 12/11/23  Urine Culture     Status: None   Collection Time: 12/12/23  1:55 AM   Specimen: Urine, Clean Catch  Result Value Ref Range Status   Specimen Description URINE, CLEAN CATCH  Final   Special Requests NONE  Final   Culture   Final    NO GROWTH Performed at [facility], [address], [place], [place] [address]    Report Status 12/13/2023 FINAL  Final  Wet prep, genital     Status: None   Collection Time: 12/12/23  2:09 AM  Result Value Ref Range Status   Yeast Wet Prep HPF POC NONE SEEN NONE SEEN Final   Trich, Wet Prep NONE SEEN NONE SEEN Final   Clue Cells Wet Prep HPF POC NONE SEEN NONE SEEN Final   WBC, Wet Prep HPF POC <10 <10 Final   Sperm NONE SEEN  Final    Comment: Performed at [facility], [address], [place], [place] [address]  Blood culture (routine x 2)     Status: None   Collection Time: 12/12/23  2:48 AM   Specimen: BLOOD RIGHT HAND  Result Value Ref Range Status   Specimen Description BLOOD RIGHT HAND  Final   Special Requests   Final    BOTTLES DRAWN AEROBIC ONLY Blood Culture results may not be optimal due to an inadequate volume of blood received in culture bottles   Culture   Final    NO GROWTH 5 DAYS Performed at [facility], [address], [place], [place] [address]    Report Status 12/17/2023 FINAL  Final  Blood culture (  routine x 2)     Status: None   Collection Time: 12/12/23  2:50 AM   Specimen: Left; Blood  Result Value Ref Range Status   Specimen Description LEFT WRIST  Final   Special Requests   Final    BOTTLES DRAWN AEROBIC AND ANAEROBIC Blood Culture results may not be optimal due to an inadequate volume of blood received in culture bottles   Culture   Final    NO GROWTH 5 DAYS Performed at [facility], [address], [place], [place] [address]    Report Status 12/17/2023 FINAL  Final    Labs: CBC: Recent Labs  Lab 12/11/23 2340 12/12/23 1558 12/13/23 1100 12/14/23 0513 12/15/23 0514  WBC 13.1* 15.1* 13.5* 10.3 8.4  NEUTROABS 11.2*  --  11.5* 8.1* 5.9  HGB 12.6 12.6 11.5* 11.5* 10.6*  HCT 39.7 39.1 36.0 36.0 32.5*  MCV 92.8 90.7 91.4 91.1 89.8  PLT 306 304 336 362 325   Basic Metabolic Panel: Recent Labs  Lab 12/11/23 2340 12/12/23 1558 12/13/23 0542 12/13/23 1100  12/14/23 0513 12/15/23 0514  NA 136  --  135  --  138 139  K 5.0  --  3.3*  --  4.1 3.9  CL 101  --  99  --  106 108  CO2 23  --  19*  --  27 24  GLUCOSE 110*  --  67*  --  80 87  BUN 8  --  10  --  10 6*  CREATININE 1.01* 0.92 1.18*  --  1.03* 0.75  CALCIUM 8.5*  --  8.3*  --  8.0* 7.8*  MG  --   --   --  2.0 2.0 1.9  PHOS  --   --   --  4.3 2.3* 2.7   Liver Function Tests: Recent Labs  Lab 12/13/23 1100 12/14/23 0513 12/15/23 0514  AST 15 14* 14*  ALT 10 9 7   ALKPHOS 54 47 43  BILITOT 1.4* 0.4 0.4  PROT 7.8 7.6 6.5  ALBUMIN 2.2* 2.0* 1.7*   CBG: No results for input(s): GLUCAP in the last 168 hours.  Discharge time spent: 40 minutes  Signed: Deliliah Room, MD [facility] 12/17/2023

## 2024-05-03 ENCOUNTER — Other Ambulatory Visit: Payer: Self-pay

## 2024-05-03 ENCOUNTER — Encounter (HOSPITAL_COMMUNITY): Payer: Self-pay | Admitting: Internal Medicine

## 2024-05-03 ENCOUNTER — Inpatient Hospital Stay (HOSPITAL_COMMUNITY)
Admission: EM | Admit: 2024-05-03 | Discharge: 2024-05-30 | DRG: 391 | Disposition: A | Payer: MEDICAID | Attending: Family Medicine | Admitting: Family Medicine

## 2024-05-03 ENCOUNTER — Observation Stay (HOSPITAL_COMMUNITY): Payer: MEDICAID

## 2024-05-03 ENCOUNTER — Emergency Department (HOSPITAL_COMMUNITY): Payer: MEDICAID

## 2024-05-03 DIAGNOSIS — L8990 Pressure ulcer of unspecified site, unspecified stage: Secondary | ICD-10-CM | POA: Insufficient documentation

## 2024-05-03 DIAGNOSIS — R109 Unspecified abdominal pain: Secondary | ICD-10-CM

## 2024-05-03 DIAGNOSIS — F0152 Vascular dementia, unspecified severity, with psychotic disturbance: Secondary | ICD-10-CM

## 2024-05-03 DIAGNOSIS — I10 Essential (primary) hypertension: Secondary | ICD-10-CM | POA: Diagnosis present

## 2024-05-03 DIAGNOSIS — R197 Diarrhea, unspecified: Secondary | ICD-10-CM

## 2024-05-03 DIAGNOSIS — K604 Rectal fistula, unspecified: Secondary | ICD-10-CM

## 2024-05-03 DIAGNOSIS — E43 Unspecified severe protein-calorie malnutrition: Secondary | ICD-10-CM | POA: Insufficient documentation

## 2024-05-03 DIAGNOSIS — R1032 Left lower quadrant pain: Secondary | ICD-10-CM

## 2024-05-03 DIAGNOSIS — K625 Hemorrhage of anus and rectum: Principal | ICD-10-CM

## 2024-05-03 DIAGNOSIS — F200 Paranoid schizophrenia: Secondary | ICD-10-CM | POA: Diagnosis present

## 2024-05-03 DIAGNOSIS — F0150 Vascular dementia without behavioral disturbance: Secondary | ICD-10-CM | POA: Diagnosis present

## 2024-05-03 DIAGNOSIS — F312 Bipolar disorder, current episode manic severe with psychotic features: Secondary | ICD-10-CM | POA: Diagnosis present

## 2024-05-03 DIAGNOSIS — R195 Other fecal abnormalities: Secondary | ICD-10-CM

## 2024-05-03 DIAGNOSIS — J9383 Other pneumothorax: Secondary | ICD-10-CM | POA: Diagnosis present

## 2024-05-03 DIAGNOSIS — K922 Gastrointestinal hemorrhage, unspecified: Secondary | ICD-10-CM | POA: Diagnosis present

## 2024-05-03 DIAGNOSIS — F1021 Alcohol dependence, in remission: Secondary | ICD-10-CM

## 2024-05-03 DIAGNOSIS — K5900 Constipation, unspecified: Secondary | ICD-10-CM

## 2024-05-03 DIAGNOSIS — R531 Weakness: Secondary | ICD-10-CM

## 2024-05-03 LAB — POC OCCULT BLOOD, ED: Fecal Occult Bld: POSITIVE — AB

## 2024-05-03 LAB — TYPE AND SCREEN
ABO/RH(D): A POS
Antibody Screen: NEGATIVE

## 2024-05-03 LAB — COMPREHENSIVE METABOLIC PANEL WITH GFR
ALT: 12 U/L (ref 0–44)
AST: 21 U/L (ref 15–41)
Albumin: 3.2 g/dL — ABNORMAL LOW (ref 3.5–5.0)
Alkaline Phosphatase: 84 U/L (ref 38–126)
Anion gap: 17 — ABNORMAL HIGH (ref 5–15)
BUN: 15 mg/dL (ref 8–23)
CO2: 24 mmol/L (ref 22–32)
Calcium: 9.4 mg/dL (ref 8.9–10.3)
Chloride: 101 mmol/L (ref 98–111)
Creatinine, Ser: 1.13 mg/dL — ABNORMAL HIGH (ref 0.44–1.00)
GFR, Estimated: 52 mL/min — ABNORMAL LOW (ref >60)
Glucose, Bld: 178 mg/dL — ABNORMAL HIGH (ref 70–99)
Potassium: 3.5 mmol/L (ref 3.5–5.1)
Sodium: 142 mmol/L (ref 135–145)
Total Bilirubin: 0.6 mg/dL (ref 0.0–1.2)
Total Protein: 11.2 g/dL — ABNORMAL HIGH (ref 6.5–8.1)

## 2024-05-03 LAB — URINALYSIS, ROUTINE W REFLEX MICROSCOPIC
Bilirubin Urine: NEGATIVE
Glucose, UA: 50 mg/dL — AB
Ketones, ur: 5 mg/dL — AB
Nitrite: NEGATIVE
Protein, ur: 100 mg/dL — AB
Specific Gravity, Urine: 1.024 (ref 1.005–1.030)
WBC, UA: >50 WBC/hpf (ref 0–5)
pH: 5 (ref 5.0–8.0)

## 2024-05-03 LAB — BASIC METABOLIC PANEL WITH GFR
Anion gap: 12 (ref 5–15)
BUN: 12 mg/dL (ref 8–23)
CO2: 24 mmol/L (ref 22–32)
Calcium: 8.8 mg/dL — ABNORMAL LOW (ref 8.9–10.3)
Chloride: 102 mmol/L (ref 98–111)
Creatinine, Ser: 0.94 mg/dL (ref 0.44–1.00)
GFR, Estimated: >60 mL/min (ref >60)
Glucose, Bld: 120 mg/dL — ABNORMAL HIGH (ref 70–99)
Potassium: 3.6 mmol/L (ref 3.5–5.1)
Sodium: 138 mmol/L (ref 135–145)

## 2024-05-03 LAB — CBC
HCT: 44.6 % (ref 36.0–46.0)
HCT: 40.9 % (ref 36.0–46.0)
Hemoglobin: 14.3 g/dL (ref 12.0–15.0)
Hemoglobin: 13.5 g/dL (ref 12.0–15.0)
MCH: 29.5 pg (ref 26.0–34.0)
MCH: 29.3 pg (ref 26.0–34.0)
MCHC: 32.1 g/dL (ref 30.0–36.0)
MCHC: 33 g/dL (ref 30.0–36.0)
MCV: 92.1 fL (ref 80.0–100.0)
MCV: 88.9 fL (ref 80.0–100.0)
Platelets: 349 K/uL (ref 150–400)
Platelets: 276 K/uL (ref 150–400)
RBC: 4.84 MIL/uL (ref 3.87–5.11)
RBC: 4.6 MIL/uL (ref 3.87–5.11)
RDW: 13.9 % (ref 11.5–15.5)
RDW: 13.5 % (ref 11.5–15.5)
WBC: 10.8 K/uL — ABNORMAL HIGH (ref 4.0–10.5)
WBC: 9.3 K/uL (ref 4.0–10.5)
nRBC: 0 % (ref 0.0–0.2)
nRBC: 0 % (ref 0.0–0.2)

## 2024-05-03 LAB — MAGNESIUM: Magnesium: 2.2 mg/dL (ref 1.7–2.4)

## 2024-05-03 LAB — CBG MONITORING, ED: Glucose-Capillary: 205 mg/dL — ABNORMAL HIGH (ref 70–99)

## 2024-05-03 MED ORDER — ACETAMINOPHEN 325 MG PO TABS
650.0000 mg | ORAL_TABLET | Freq: Four times a day (QID) | ORAL | Status: DC | PRN
  Administered 2024-05-04 – 2024-05-29 (×22): 650 mg via ORAL
  Filled 2024-05-03 (×22): qty 2

## 2024-05-03 MED ORDER — POLYETHYLENE GLYCOL 3350 17 G PO PACK
17.0000 g | PACK | Freq: Two times a day (BID) | ORAL | Status: DC
  Administered 2024-05-03 – 2024-05-04 (×2): 17 g via ORAL
  Filled 2024-05-03 (×2): qty 1

## 2024-05-03 MED ORDER — POLYETHYLENE GLYCOL 3350 17 G PO PACK
17.0000 g | PACK | Freq: Once | ORAL | Status: AC
  Administered 2024-05-03: 17 g via ORAL
  Filled 2024-05-03: qty 1

## 2024-05-03 MED ORDER — SODIUM CHLORIDE 0.9% FLUSH
3.0000 mL | Freq: Two times a day (BID) | INTRAVENOUS | Status: DC
  Administered 2024-05-04 – 2024-05-30 (×48): 3 mL via INTRAVENOUS

## 2024-05-03 MED ORDER — ACETAMINOPHEN 325 MG PO TABS
650.0000 mg | ORAL_TABLET | Freq: Once | ORAL | Status: DC
  Filled 2024-05-03: qty 2

## 2024-05-03 MED ORDER — ACETAMINOPHEN 650 MG RE SUPP: 650.0000 mg | Freq: Four times a day (QID) | RECTAL | Status: DC | PRN

## 2024-05-03 MED ORDER — SENNA 8.6 MG PO TABS
1.0000 | ORAL_TABLET | Freq: Once | ORAL | Status: AC
  Administered 2024-05-03: 8.6 mg via ORAL
  Filled 2024-05-03: qty 1

## 2024-05-03 MED ORDER — SODIUM CHLORIDE 0.9 % IV BOLUS
500.0000 mL | Freq: Once | INTRAVENOUS | Status: AC
  Administered 2024-05-03: 500 mL via INTRAVENOUS

## 2024-05-03 NOTE — ED Notes (Signed)
 Patient transported to CT

## 2024-05-03 NOTE — ED Notes (Signed)
 Chaperoned PA w/ hemoccult sample.

## 2024-05-03 NOTE — ED Triage Notes (Signed)
 Pt BIB [facility] from home with caregiver Daughter. Pt endorses increase generalized weakness  Associated with decreased po intake and watery reddish brown stools. Hx dementia

## 2024-05-03 NOTE — ED Notes (Signed)
 Phlebotomy requested for type and screen

## 2024-05-03 NOTE — ED Notes (Signed)
 Pt has had a small bright red BM. Was assisted into clean hospital gown. Placed on a stretcher. Charge Nurse informed pt needs room.

## 2024-05-03 NOTE — ED Provider Triage Note (Signed)
 Emergency Medicine Provider Triage Evaluation Note  Naomie DELENA Kerns , a 68 y.o. female  was evaluated in triage.  Pt complains of generalized weakness.  She also endorses decreased p.o. intake.  On the way to the emergency department she experienced an episode of loose stools.  EMS reports reddish-brown stools concerning for possible bleeding.  Patient endorses upset stomach.  She also states that earlier in the day she felt that she was paralyzed from the waist down.  EMS reports the patient was able to stand and walk without difficulty.  Patient does have history of dementia  Review of Systems  Positive:  Negative:   Physical Exam  BP (!) 155/97 (BP Location: Right Arm)   Pulse (!) 109   Temp (!) 97 F (36.1 C)   Resp 16   SpO2 96%  Gen:   Awake, no distress   Resp:  Normal effort  MSK:   Moves extremities without difficulty  Other:    Medical Decision Making  Medically screening exam initiated at 5:24 AM.  Appropriate orders placed.  Naomie DELENA Kerns was informed that the remainder of the evaluation will be completed by another provider, this initial triage assessment does not replace that evaluation, and the importance of remaining in the ED until their evaluation is complete.     Logan Ubaldo [place], [place] 05/03/24 [phone]

## 2024-05-03 NOTE — H&P (Addendum)
 History and Physical   Carolyn Huynh FMW:[id number] DOB: 02/13/1956 DOA: 05/03/2024  PCP: Patient, [name]   Patient coming from: Home  Chief Complaint: Weakness, red/watery stool  HPI: Carolyn Huynh is a 68 y.o. female with medical history significant of hypertension, dementia, bipolar disorder, schizophrenia, recurrent pneumothorax, alcohol use, diverticulosis presenting with weakness and red/watery stool.  History obtained with assistance of daughter who is caregiver in the setting of patient's dementia.  Patient has had worsening weakness over the last several days with decreased p.o. intake and watery/red-brown stool.  Patient denies fevers, chills, chest pain, shortness of breath, nausea.  Does have baseline dementia but appears to be able to participate in review of systems.    ED Course: Vitals in the ED notable for heart rate in the 90s-100s, blood pressure in the 110s-150 systolic.  Lab workup included CMP with normal bicarb, gap 17, creatinine stable 1.13, glucose 178, protein 11.2, albumin 3.2.  CBC with leukocytosis at 10.8, hemoglobin stable at 14 but baseline for months ago was 11-12.  FOBT positive.  Urinalysis with glucose, hemoglobin, leukocytes, bacteria.  Patient typed and screened in the ED.  CT head showed no acute abnormality.  Patient received 500 cc IVF bolus and GI was consulted in the ED.  Review of Systems: As per HPI otherwise all other systems reviewed and are negative.  Past Medical History:  Diagnosis Date   Asthma    COPD (chronic obstructive pulmonary disease) (HCC)    Dementia (HCC)    Hypertension    Pneumothorax     Past Surgical History:  Procedure Laterality Date   CHEST TUBE INSERTION     VIDEO ASSISTED THORACOSCOPY Right 02/01/2018   Procedure: VIDEO ASSISTED THORACOSCOPY, BLEB STAPLING;  Surgeon: Fleeta Hanford Coy, MD;  Location: [facility] OR;  Service: Thoracic;  Laterality: Right;    Social History  reports that she has never smoked.  She has never used smokeless tobacco. She reports that she does not currently use alcohol. She reports that she does not use drugs.  Allergies  Allergen Reactions   Aspirin Shortness Of Breath   Shellfish Allergy Anaphylaxis   Ivp Dye [Iodinated Contrast Media] Other (See Comments)    Chest pain   No family history on file.  Prior to Admission medications   Medication Sig Start Date End Date Taking? Authorizing Provider  acetaminophen  (TYLENOL ) 325 MG tablet Take 2 tablets (650 mg total) by mouth every 6 (six) hours as needed for moderate pain (pain score 4-6), headache or fever. 12/17/23   Rashid, Farhan, MD  loratadine  (CLARITIN ) 10 MG tablet Take 1 tablet (10 mg total) by mouth daily as needed for allergies. 12/17/23   Dino Antu, MD    Physical Exam: Vitals:   05/03/24 0452 05/03/24 0810 05/03/24 1029 05/03/24 1346  BP: (!) 155/97 (!) 146/90 (!) 149/94 (!) 114/99  Pulse: (!) 109 92 91 91  Resp: 16 15 15 15   Temp: (!) 97 F (36.1 C) 97.8 F (36.6 C) 98.3 F (36.8 C) 98.4 F (36.9 C)  TempSrc:  Oral Oral Oral  SpO2: 96% 95% 95% 95%    Physical Exam Constitutional:      General: She is not in acute distress.    Appearance: Normal appearance.     Comments: Thin elderly female  HENT:     Head: Normocephalic and atraumatic.     Mouth/Throat:     Mouth: Mucous membranes are moist.     Pharynx: Oropharynx is clear.  Eyes:     Extraocular Movements: Extraocular movements intact.     Pupils: Pupils are equal, round, and reactive to light.  Cardiovascular:     Rate and Rhythm: Normal rate and regular rhythm.     Pulses: Normal pulses.     Heart sounds: Normal heart sounds.  Pulmonary:     Effort: Pulmonary effort is normal. No respiratory distress.     Breath sounds: Normal breath sounds.  Abdominal:     General: Bowel sounds are normal. There is no distension.     Palpations: Abdomen is soft.     Tenderness: There is abdominal tenderness in the left lower quadrant.   Musculoskeletal:        General: No swelling or deformity.  Skin:    General: Skin is warm and dry.  Neurological:     General: No focal deficit present.     Mental Status: Mental status is at baseline.    Labs on Admission: I have personally reviewed following labs and imaging studies  CBC: Recent Labs  Lab 05/03/24 0509  WBC 10.8*  HGB 14.3  HCT 44.6  MCV 92.1  PLT 349    Basic Metabolic Panel: Recent Labs  Lab 05/03/24 0638  NA 142  K 3.5  CL 101  CO2 24  GLUCOSE 178*  BUN 15  CREATININE 1.13*  CALCIUM 9.4    GFR: CrCl cannot be calculated (Unknown ideal weight.).  Liver Function Tests: Recent Labs  Lab 05/03/24 0638  AST 21  ALT 12  ALKPHOS 84  BILITOT 0.6  PROT 11.2*  ALBUMIN 3.2*    Urine analysis:    Component Value Date/Time   COLORURINE AMBER (A) 05/03/2024 1233   APPEARANCEUR CLOUDY (A) 05/03/2024 1233   LABSPEC 1.024 05/03/2024 1233   PHURINE 5.0 05/03/2024 1233   GLUCOSEU 50 (A) 05/03/2024 1233   HGBUR LARGE (A) 05/03/2024 1233   BILIRUBINUR NEGATIVE 05/03/2024 1233   KETONESUR 5 (A) 05/03/2024 1233   PROTEINUR 100 (A) 05/03/2024 1233   NITRITE NEGATIVE 05/03/2024 1233   LEUKOCYTESUR LARGE (A) 05/03/2024 1233    Radiological Exams on Admission: CT Head Wo Contrast Result Date: 05/03/2024 EXAM: CT HEAD WITHOUT CONTRAST 05/03/2024 06:16:30 AM TECHNIQUE: CT of the head was performed without the administration of intravenous contrast. Automated exposure control, iterative reconstruction, and/or weight based adjustment of the mA/kV was utilized to reduce the radiation dose to as low as reasonably achievable. COMPARISON: Head CT 12/11/2023. CLINICAL HISTORY: 68 year old female with mental status change of unknown cause. FINDINGS: BRAIN AND VENTRICLES: No acute hemorrhage. No evidence of acute infarct. No hydrocephalus. No extra-axial collection. No mass effect or midline shift. Brain volume stable and normal for age. Patchy mild to  moderate bilateral cerebral white matter hypodensity in both hemispheres is stable, more conspicuous on the left. Faint right side basal ganglia calcifications are stable. No suspicious intracranial vascular hyperdensity. ORBITS: No acute abnormality. SINUSES: Paranasal sinuses, middle ears and mastoids remain well aerated. SOFT TISSUES AND SKULL: No acute soft tissue abnormality. No skull fracture. IMPRESSION: 1. No acute intracranial abnormality. 2. Stable moderate for age cerebral white matter disease, most commonly due to small vessel ischemia. Electronically signed by: Helayne Hurst MD 05/03/2024 06:23 AM EST RP Workstation: [id number]   EKG: Independently reviewed.  Sinus rhythm at 90 bpm.  Nonspecific T wave changes.  Baseline artifact.  Baseline wander.  Assessment/Plan Principal Problem:   Acute GI bleeding Active Problems:   Recurrent spontaneous pneumothorax   Schizophrenia, paranoid type (  HCC)   Severe manic bipolar 1 disorder with psychotic behavior (HCC)   Vascular dementia with delusions (HCC)   Hypertension   Severe alcohol use disorder, in sustained remission (HCC)   GI bleed Diarrhea Diverticulosis > Patient presenting with watery stools that are red-brown in color for the past 2 to 3 days. > Hemoglobin is stable/elevated from baseline at 14 from a baseline of 11-12 4 months ago.  Possibly secondary to dehydration though creatinine is stable. > Positive FOBT. > No recent EGD or colonoscopy in the chart.  Did have CT scan showing diverticulosis/diverticulitis in June of this year. > On exam, LLQ TTP. > Received 500 cc IV fluid in the ED and GI consulted.  - Monitor on telemetry - Anticipate clear liquid diet - Trend hemoglobin - CT abdomen pelvis WO contrast (Hx of contrast allergy, but lists only chest pain; so could be considered if needed) - GI pathogen panel  - Supportive care  Urinary frequency Abnormal urinalysis > Family reports increased urinary  frequency/incontinence.  Urinalysis with hemoglobin, leukocytes, bacteria. - Check urine culture  Elevated protein gap > Protein 11.2, albumin 3.2. > In setting of acute illness, will confirm with repeat labs tomorrow. - If persistent, may benefit from SPEP/immunofixation.  Dementia Bipolar disorder Schizophrenia - Not on any medication for this  History of recurrent pneumothorax - Noted  History of alcohol use > Sustained abstinence - Noted  DVT prophylaxis: SCDs Code Status:   Full Family Communication:  Daughters Carolyn Huynh) Updated by phone.   Attempted to reach Carolyn Huynh, by request of Carolyn Huynh, but number listed was not in service and when I attempted to call back Carolyn Huynh there was no answer.  Disposition Plan:   Patient is from:  Home  Anticipated DC to:  Home  Anticipated DC date:  1 to 2 days  Anticipated DC barriers: None  Consults called:  GI Admission status:  Observation, telemetry  Severity of Illness: The appropriate patient status for this patient is OBSERVATION. Observation status is judged to be reasonable and necessary in order to provide the required intensity of service to ensure the patient's safety. The patient's presenting symptoms, physical exam findings, and initial radiographic and laboratory data in the context of their medical condition is felt to place them at decreased risk for further clinical deterioration. Furthermore, it is anticipated that the patient will be medically stable for discharge from the hospital within 2 midnights of admission.    Marsa KATHEE Scurry MD [facility]  How to contact the [facility] Attending or Consulting provider 7A - 7P or covering provider during after hours 7P -7A, for this patient?   Check the care team in [facility] and look for a) attending/consulting [facility] provider listed and b) the [facility] team listed Log into [email or web address] and use [facility] universal password to access. If you do not have the  password, please contact the hospital operator. Locate the [facility] provider you are looking for under [facility] and page to a number that you can be directly reached. If you still have difficulty reaching the provider, please page the [facility] (Director on Call) for the Hospitalists listed on amion for assistance.  05/03/2024, 3:46 PM

## 2024-05-03 NOTE — ED Provider Notes (Cosign Needed Addendum)
 [place] EMERGENCY DEPARTMENT AT [facility] Provider Note   CSN: [id number] Arrival date & time: 05/03/24  0448     Patient presents with: Weakness   Carolyn Huynh is a 68 y.o. female.   Pt denies any complaints.  Pt patient's daughter sent patient to the emergency department because she has had bright red rectal bleeding.  Patient's daughter reported that patient has had increasing weakness and decreased appetite.  Patient is reported to have had increasing confusion.  Patient has a past medical history of hypertension, GI bleed,mixed  Alzheimer's and previous pneumothorax.   The history is provided by the patient and the EMS personnel. No language interpreter was used.  Weakness      Prior to Admission medications   Medication Sig Start Date End Date Taking? Authorizing Provider  acetaminophen  (TYLENOL ) 325 MG tablet Take 2 tablets (650 mg total) by mouth every 6 (six) hours as needed for moderate pain (pain score 4-6), headache or fever. 12/17/23   Rashid, Farhan, MD  loratadine  (CLARITIN ) 10 MG tablet Take 1 tablet (10 mg total) by mouth daily as needed for allergies. 12/17/23   Rashid, Farhan, MD  oxyCODONE  (OXY IR/ROXICODONE ) 5 MG immediate release tablet Take 1 tablet (5 mg total) by mouth every 6 (six) hours as needed for severe pain (pain score 7-10). 12/17/23   Dino Antu, MD    Allergies: Aspirin, Shellfish allergy, and Ivp dye [iodinated contrast media]    Review of Systems  Neurological:  Positive for weakness.  All other systems reviewed and are negative.   Updated Vital Signs BP (!) 114/99 (BP Location: Left Arm)   Pulse 91   Temp 98.4 F (36.9 C) (Oral)   Resp 15   SpO2 95%   Physical Exam Vitals and nursing note reviewed.  Constitutional:      Appearance: She is well-developed.  HENT:     Head: Normocephalic.     Mouth/Throat:     Mouth: Mucous membranes are moist.  Eyes:     Extraocular Movements: Extraocular movements intact.      Pupils: Pupils are equal, round, and reactive to light.  Cardiovascular:     Rate and Rhythm: Normal rate.  Pulmonary:     Effort: Pulmonary effort is normal.  Abdominal:     General: Abdomen is flat. There is no distension.  Musculoskeletal:        General: Normal range of motion.     Cervical back: Normal range of motion.  Skin:    General: Skin is warm.  Neurological:     General: No focal deficit present.     Mental Status: She is alert. She is disoriented.     (all labs ordered are listed, but only abnormal results are displayed) Labs Reviewed  CBC - Abnormal; Notable for the following components:      Result Value   WBC 10.8 (*)    All other components within normal limits  COMPREHENSIVE METABOLIC PANEL WITH GFR - Abnormal; Notable for the following components:   Glucose, Bld 178 (*)    Creatinine, Ser 1.13 (*)    Total Protein 11.2 (*)    Albumin 3.2 (*)    GFR, Estimated 52 (*)    Anion gap 17 (*)    All other components within normal limits  URINALYSIS, ROUTINE W REFLEX MICROSCOPIC - Abnormal; Notable for the following components:   Color, Urine AMBER (*)    APPearance CLOUDY (*)    Glucose, UA 50 (*)  Hgb urine dipstick LARGE (*)    Ketones, ur 5 (*)    Protein, ur 100 (*)    Leukocytes,Ua LARGE (*)    Bacteria, UA MANY (*)    All other components within normal limits  POC OCCULT BLOOD, ED - Abnormal; Notable for the following components:   Fecal Occult Bld POSITIVE (*)    All other components within normal limits  CBG MONITORING, ED - Abnormal; Notable for the following components:   Glucose-Capillary 205 (*)    All other components within normal limits  TYPE AND SCREEN    EKG: None  Radiology: CT Head Wo Contrast Result Date: 05/03/2024 EXAM: CT HEAD WITHOUT CONTRAST 05/03/2024 06:16:30 AM TECHNIQUE: CT of the head was performed without the administration of intravenous contrast. Automated exposure control, iterative reconstruction, and/or weight  based adjustment of the mA/kV was utilized to reduce the radiation dose to as low as reasonably achievable. COMPARISON: Head CT 12/11/2023. CLINICAL HISTORY: 68 year old female with mental status change of unknown cause. FINDINGS: BRAIN AND VENTRICLES: No acute hemorrhage. No evidence of acute infarct. No hydrocephalus. No extra-axial collection. No mass effect or midline shift. Brain volume stable and normal for age. Patchy mild to moderate bilateral cerebral white matter hypodensity in both hemispheres is stable, more conspicuous on the left. Faint right side basal ganglia calcifications are stable. No suspicious intracranial vascular hyperdensity. ORBITS: No acute abnormality. SINUSES: Paranasal sinuses, middle ears and mastoids remain well aerated. SOFT TISSUES AND SKULL: No acute soft tissue abnormality. No skull fracture. IMPRESSION: 1. No acute intracranial abnormality. 2. Stable moderate for age cerebral white matter disease, most commonly due to small vessel ischemia. Electronically signed by: Helayne Hurst MD 05/03/2024 06:23 AM EST RP Workstation: [id number]     Procedures   Medications Ordered in the ED  sodium chloride  0.9 % bolus 500 mL (0 mLs Intravenous Stopped 05/03/24 1029)    Clinical Course as of 05/03/24 1511  Wed May 03, 2024  1509 Fecal Occult Blood, POC(!): POSITIVE [JS]    Clinical Course User Index [JS] Huynh, Johana, PA-C                                 Medical Decision Making Patient sent to the emergency department by her family for evaluation of rectal bleeding  Amount and/or Complexity of Data Reviewed External Data Reviewed: notes. Labs: ordered. Decision-making details documented in ED Course.    Details: Labs ordered reviewed and interpreted.  Patient has Hemoccult positive stool.  UA shows large leukocytes and many bacteria.  White blood cell count is 10.8. Radiology: ordered and independent interpretation performed. Decision-making details documented in ED  Course.    Details: CT head shows no acute findings. Discussion of management or test interpretation with external provider(s): Hospitalist consulted.  Dr. Seena will admit   Risk Decision regarding hospitalization.        Final diagnoses:  Rectal bleeding  Weakness    ED Discharge Orders     None          [place] Sonny MARLA DEVONNA 05/03/24 1523    Fl[place] Sonny MARLA, [place] 05/03/24 1523    Rogelia Jerilynn RAMAN, MD 05/04/24 [phone]

## 2024-05-04 DIAGNOSIS — K5900 Constipation, unspecified: Secondary | ICD-10-CM

## 2024-05-04 DIAGNOSIS — R195 Other fecal abnormalities: Secondary | ICD-10-CM

## 2024-05-04 DIAGNOSIS — R109 Unspecified abdominal pain: Secondary | ICD-10-CM

## 2024-05-04 LAB — COMPREHENSIVE METABOLIC PANEL WITH GFR
ALT: 9 U/L (ref 0–44)
AST: 14 U/L — ABNORMAL LOW (ref 15–41)
Albumin: 2.4 g/dL — ABNORMAL LOW (ref 3.5–5.0)
Alkaline Phosphatase: 63 U/L (ref 38–126)
Anion gap: 13 (ref 5–15)
BUN: 14 mg/dL (ref 8–23)
CO2: 25 mmol/L (ref 22–32)
Calcium: 8.6 mg/dL — ABNORMAL LOW (ref 8.9–10.3)
Chloride: 100 mmol/L (ref 98–111)
Creatinine, Ser: 1.08 mg/dL — ABNORMAL HIGH (ref 0.44–1.00)
GFR, Estimated: 55 mL/min — ABNORMAL LOW (ref >60)
Glucose, Bld: 99 mg/dL (ref 70–99)
Potassium: 3.4 mmol/L — ABNORMAL LOW (ref 3.5–5.1)
Sodium: 138 mmol/L (ref 135–145)
Total Bilirubin: 1 mg/dL (ref 0.0–1.2)
Total Protein: 8.1 g/dL (ref 6.5–8.1)

## 2024-05-04 LAB — CBC
HCT: 37.5 % (ref 36.0–46.0)
Hemoglobin: 12.3 g/dL (ref 12.0–15.0)
MCH: 29.4 pg (ref 26.0–34.0)
MCHC: 32.8 g/dL (ref 30.0–36.0)
MCV: 89.7 fL (ref 80.0–100.0)
Platelets: 286 K/uL (ref 150–400)
RBC: 4.18 MIL/uL (ref 3.87–5.11)
RDW: 13.7 % (ref 11.5–15.5)
WBC: 9.2 K/uL (ref 4.0–10.5)
nRBC: 0 % (ref 0.0–0.2)

## 2024-05-04 LAB — OCCULT BLOOD X 1 CARD TO LAB, STOOL: Fecal Occult Bld: NEGATIVE

## 2024-05-04 MED ORDER — POLYETHYLENE GLYCOL 3350 17 GM/SCOOP PO POWD
238.0000 g | Freq: Once | ORAL | Status: AC
  Administered 2024-05-04: 238 g via ORAL
  Filled 2024-05-04: qty 238

## 2024-05-04 MED ORDER — POLYETHYLENE GLYCOL 3350 17 G PO PACK
17.0000 g | PACK | Freq: Two times a day (BID) | ORAL | Status: DC
  Filled 2024-05-04: qty 1

## 2024-05-04 MED ORDER — FLEET ENEMA RE ENEM
1.0000 | ENEMA | Freq: Once | RECTAL | Status: AC
  Administered 2024-05-04: 1 via RECTAL
  Filled 2024-05-04: qty 1

## 2024-05-04 MED ORDER — ENSURE PLUS HIGH PROTEIN PO LIQD
237.0000 mL | Freq: Two times a day (BID) | ORAL | Status: DC
  Administered 2024-05-04 – 2024-05-06 (×5): 237 mL via ORAL

## 2024-05-04 NOTE — Consult Note (Addendum)
 [facility] Nurse Consult Note: Reason for Consult: Consult for bilat groin areas.  Performed remotely after review of progress notes and photos in the EMR.  Pt has full thickness draining wounds to inner labia/groin skin folds, red and moist with mod amt tan drainage and strong foul odor. CT scan of pelvis did not indicate an abscess.    Topical treatment orders provided for bedside nurses to perform as follows to provide antimicrobial benefits and absorb drainage: Cut piece of Aquacel in half Soila # [phone]) and tuck into groin/labia folds over draining areas Q day.    Please re-consult if further assistance is needed.  Thank-you,  Stephane Fought MSN, RN, CWOCN, CWCN-AP, CNS Contact Mon-Fri 0700-1500: [phone]
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
## 2024-05-04 NOTE — Plan of Care (Signed)
  Problem: Education: Goal: Knowledge of General Education information will improve Description: Including pain rating scale, medication(s)/side effects and non-pharmacologic comfort measures Outcome: Progressing   Problem: Clinical Measurements: Goal: Ability to maintain clinical measurements within normal limits will improve Outcome: Progressing Goal: Will remain free from infection Outcome: Progressing Goal: Diagnostic test results will improve Outcome: Progressing Goal: Respiratory complications will improve Outcome: Progressing Goal: Cardiovascular complication will be avoided Outcome: Progressing   Problem: Clinical Measurements: Goal: Ability to maintain clinical measurements within normal limits will improve Outcome: Progressing Goal: Will remain free from infection Outcome: Progressing Goal: Diagnostic test results will improve Outcome: Progressing Goal: Respiratory complications will improve Outcome: Progressing Goal: Cardiovascular complication will be avoided Outcome: Progressing   Problem: Clinical Measurements: Goal: Ability to maintain clinical measurements within normal limits will improve Outcome: Progressing Goal: Will remain free from infection Outcome: Progressing Goal: Diagnostic test results will improve Outcome: Progressing Goal: Respiratory complications will improve Outcome: Progressing Goal: Cardiovascular complication will be avoided Outcome: Progressing   Problem: Clinical Measurements: Goal: Ability to maintain clinical measurements within normal limits will improve Outcome: Progressing Goal: Will remain free from infection Outcome: Progressing Goal: Diagnostic test results will improve Outcome: Progressing Goal: Respiratory complications will improve Outcome: Progressing Goal: Cardiovascular complication will be avoided Outcome: Progressing   Problem: Elimination: Goal: Will not experience complications related to bowel  motility Outcome: Progressing Goal: Will not experience complications related to urinary retention Outcome: Progressing   Problem: Nutrition: Goal: Adequate nutrition will be maintained Outcome: Progressing

## 2024-05-04 NOTE — Progress Notes (Signed)
 [facility]   ROUNDING   NOTE Carolyn Huynh FMW:[id number]  DOB: 1955-07-21  DOA: 05/03/2024  PCP: Patient, [name]  05/04/2024,10:36 AM  LOS: 0 days    Code Status: Full code     from: Home   68 year old female Longstanding schizophrenia/delusional disorder/EtOH abuse with psychiatric hospitalization in 2023 Known history of pneumothorax with surgery to left chest in 2000 complicated by spontaneous pneumothoraces managed by Dr. Thurmon recent admission for this 2019 undergoing a right VATS with stapling of blebs and mechanical pleurodesis  Chronology  11/19 brought to ED from home with daughter increasing weakness red water stools-evaluation in the ED no obvious hemorrhoids had bright red blood per rectum-hemoglobin was 14 with WBC of 10-FOBT was positive Large leukocytes on urinalysis large hemoglobin as well  Pertinent imaging/studies till date  CT head without contrast no intracranial abnormality small matter changes with some small vessel ischemia CT abdomen pelvis no acute abnormalities moderate fecal retention 2.9 cm renal cyst moderate umbilical hernia adjacent periumbilical fat emphysema noted   Assessment  & Plan :    GI bleed?  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  No ancillary information Will get pharmacist to review to ensure she is on the right stuff Previous heavy EtOH Previous pneumothoraces Moderate Alzheimer's She is able to orient to person place but seems more delusional than demented Severe P-EM Bilateral groin skin breakdown inner labia Appreciated with wound care input-probably does need continued care  to area with aquacel dressings--would watch closel    Data Reviewed today:  138 potassium 3.4 BUN/creatinine 14/1.0 WBC 9.2 hemoglobin 12.3 platelet 286   DVT prophylaxis: scd  Status is: Observation The patient will require care spanning > 2 midnights and should be moved to inpatient because:     Dispo/Global plan:  called daughter cheryl but didn't rreach her  Called [name] [phone] and updated Lives with sister---the sister lives here locally---I then got disconnected  Time  60   Subjective:   1 stool earlier today Some confusion at times, no fever no chills States is hungry   Objective + exam Vitals:   05/03/24 2124 05/04/24 0104 05/04/24 0544 05/04/24 0734  BP: (!) 159/95 (!) 143/86 136/85 (!) 123/96  Pulse: 89 87 83 77  Resp:    18  Temp: 98.6 F (37 C) 98.6 F (37 C) 98.3 F (36.8 C)   TempSrc: Oral Oral Oral   SpO2: 100% 95% 98% 98%   There were no vitals filed for this visit.   Examination: Cachectic black female no distress S1-S2 no murmur ROM intact Abdomen soft no rebound no guarding, but tender on deep pressure ein lower quadrant Neuro intact   Scheduled Meds:  feeding supplement  237 mL Oral BID BM   polyethylene glycol  17 g Oral BID   polyethylene glycol  17 g Oral BID   sodium chloride  flush  3 mL Intravenous Q12H   sodium phosphate   1 enema Rectal Once   Continuous Infusions: acetaminophen  **OR** acetaminophen   Jai-Gurmukh Arriyana Rodell, MD  [facility]

## 2024-05-04 NOTE — Plan of Care (Signed)

## 2024-05-05 DIAGNOSIS — R109 Unspecified abdominal pain: Secondary | ICD-10-CM

## 2024-05-05 LAB — CBC WITH DIFFERENTIAL/PLATELET
Abs Immature Granulocytes: 0.04 K/uL (ref 0.00–0.07)
Abs Immature Granulocytes: 0.05 K/uL (ref 0.00–0.07)
Basophils Absolute: 0 K/uL (ref 0.0–0.1)
Basophils Absolute: 0 K/uL (ref 0.0–0.1)
Basophils Relative: 0 %
Basophils Relative: 0 %
Eosinophils Absolute: 0.3 K/uL (ref 0.0–0.5)
Eosinophils Absolute: 0.2 K/uL (ref 0.0–0.5)
Eosinophils Relative: 3 %
Eosinophils Relative: 2 %
HCT: 38.8 % (ref 36.0–46.0)
HCT: 40.5 % (ref 36.0–46.0)
Hemoglobin: 12.7 g/dL (ref 12.0–15.0)
Hemoglobin: 13.2 g/dL (ref 12.0–15.0)
Immature Granulocytes: 0 %
Immature Granulocytes: 1 %
Lymphocytes Relative: 7 %
Lymphocytes Relative: 7 %
Lymphs Abs: 0.8 K/uL (ref 0.7–4.0)
Lymphs Abs: 0.7 K/uL (ref 0.7–4.0)
MCH: 29.3 pg (ref 26.0–34.0)
MCH: 29.2 pg (ref 26.0–34.0)
MCHC: 32.7 g/dL (ref 30.0–36.0)
MCHC: 32.6 g/dL (ref 30.0–36.0)
MCV: 89.4 fL (ref 80.0–100.0)
MCV: 89.6 fL (ref 80.0–100.0)
Monocytes Absolute: 0.9 K/uL (ref 0.1–1.0)
Monocytes Absolute: 0.6 K/uL (ref 0.1–1.0)
Monocytes Relative: 8 %
Monocytes Relative: 6 %
Neutro Abs: 8.7 K/uL — ABNORMAL HIGH (ref 1.7–7.7)
Neutro Abs: 8.5 K/uL — ABNORMAL HIGH (ref 1.7–7.7)
Neutrophils Relative %: 82 %
Neutrophils Relative %: 84 %
Platelets: 294 K/uL (ref 150–400)
Platelets: 291 K/uL (ref 150–400)
RBC: 4.34 MIL/uL (ref 3.87–5.11)
RBC: 4.52 MIL/uL (ref 3.87–5.11)
RDW: 13.4 % (ref 11.5–15.5)
RDW: 13.5 % (ref 11.5–15.5)
WBC: 10.7 K/uL — ABNORMAL HIGH (ref 4.0–10.5)
WBC: 10.1 K/uL (ref 4.0–10.5)
nRBC: 0 % (ref 0.0–0.2)
nRBC: 0 % (ref 0.0–0.2)

## 2024-05-05 LAB — BASIC METABOLIC PANEL WITH GFR
Anion gap: 12 (ref 5–15)
BUN: 17 mg/dL (ref 8–23)
CO2: 25 mmol/L (ref 22–32)
Calcium: 8.6 mg/dL — ABNORMAL LOW (ref 8.9–10.3)
Chloride: 99 mmol/L (ref 98–111)
Creatinine, Ser: 0.83 mg/dL (ref 0.44–1.00)
GFR, Estimated: >60 mL/min (ref >60)
Glucose, Bld: 160 mg/dL — ABNORMAL HIGH (ref 70–99)
Potassium: 3.4 mmol/L — ABNORMAL LOW (ref 3.5–5.1)
Sodium: 136 mmol/L (ref 135–145)

## 2024-05-05 MED ORDER — FLEET ENEMA RE ENEM
1.0000 | ENEMA | Freq: Once | RECTAL | Status: AC
  Administered 2024-05-05: 1 via RECTAL
  Filled 2024-05-05: qty 1

## 2024-05-05 MED ORDER — POTASSIUM CHLORIDE 10 MEQ/100ML IV SOLN
10.0000 meq | INTRAVENOUS | Status: AC
  Administered 2024-05-05: 10 meq via INTRAVENOUS
  Filled 2024-05-05 (×2): qty 100

## 2024-05-05 MED ORDER — POLYETHYLENE GLYCOL 3350 17 G PO PACK
17.0000 g | PACK | Freq: Two times a day (BID) | ORAL | Status: DC
  Administered 2024-05-05 (×2): 17 g via ORAL
  Filled 2024-05-05 (×3): qty 1

## 2024-05-05 MED ORDER — ADULT MULTIVITAMIN W/MINERALS CH
1.0000 | ORAL_TABLET | Freq: Every day | ORAL | Status: DC
  Administered 2024-05-06 – 2024-05-29 (×22): 1 via ORAL
  Filled 2024-05-05 (×25): qty 1
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
## 2024-05-05 NOTE — Progress Notes (Addendum)
 Patient ID: Carolyn Huynh, female   DOB: [birth year], 68 y.o.   MRN: [id number]    Progress Note   Subjective   Day # 3 CC; weakness, poor oral intake, blood noted in stool  Stool documented Hemoccult negative yesterday WBC 10.7/hemoglobin 12.7/hematocrit 38.8 stable Stool documented Hemoccult negative yesterday  Patient pleasant and present today complaining of some mild suprapubic discomfort, says rectal discomfort has improved.  She refused to drink the MiraLAX  Gatorade prep last night and says she has been passing a little bit of stool Discussed possible flexible sigmoidoscopy versus colonoscopy again and she states today that she has had that in the past and she does not want to go through any procedures.    Objective   Vital signs in last 24 hours: Temp:  [97.4 F (36.3 C)-98.5 F (36.9 C)] 98.5 F (36.9 C) (11/21 1211) Pulse Rate:  [79-89] 89 (11/21 1211) Resp:  [18-20] 20 (11/21 1211) BP: (115-133)/(72-89) 132/89 (11/21 1211) SpO2:  [92 %-100 %] 95 % (11/21 1211) Last BM Date : 05/03/24 General:    Older African-American female in NAD, very thin and somewhat frail appearing Heart:  Regular rate and rhythm; no murmurs Lungs: Respirations even and unlabored, lungs CTA bilaterally Abdomen:  Soft, nontender and nondistended. Normal bowel sounds. Extremities:  Without edema. Neurologic:  Alert and oriented,  grossly normal neurologically. Psych:  Cooperative. Normal mood and affect.  Intake/Output from previous day: 11/20 0701 - 11/21 0700 In: 3 [I.V.:3] Out: -  Intake/Output this shift: No intake/output data recorded.  Lab Results: Recent Labs    05/03/24 1914 05/04/24 0530 05/05/24 0526  WBC 9.3 9.2 10.7*  HGB 13.5 12.3 12.7  HCT 40.9 37.5 38.8  PLT 276 286 294   BMET Recent Labs    05/03/24 0638 05/03/24 2324 05/04/24 0530  NA 142 138 138  K 3.5 3.6 3.4*  CL 101 102 100  CO2 24 24 25   GLUCOSE 178* 120* 99  BUN 15 12 14   CREATININE 1.13* 0.94  1.08*  CALCIUM 9.4 8.8* 8.6*   LFT Recent Labs    05/04/24 0530  PROT 8.1  ALBUMIN 2.4*  AST 14*  ALT 9  ALKPHOS 63  BILITOT 1.0   PT/INR No results for input(s): LABPROT, INR in the last 72 hours.  Studies/Results: CT ABDOMEN PELVIS WO CONTRAST Result Date: 05/03/2024 CLINICAL DATA:  Left lower quadrant pain. Bright red blood per rectum. EXAM: CT ABDOMEN AND PELVIS WITHOUT CONTRAST TECHNIQUE: Multidetector CT imaging of the abdomen and pelvis was performed following the standard protocol without IV contrast. RADIATION DOSE REDUCTION: This exam was performed according to the departmental dose-optimization program which includes automated exposure control, adjustment of the mA and/or kV according to patient size and/or use of iterative reconstruction technique. COMPARISON:  12/11/2023 FINDINGS: Lower chest: Heart is normal size. Visualized lung bases demonstrate emphysematous disease with associated mild interstitial prominence. No airspace disease or effusion. Hepatobiliary: Gallbladder not visualized. Minimal prominence of the main common bile duct and central intrahepatic ducts unchanged. Liver is otherwise unremarkable. Pancreas: Normal. Spleen: Normal. Adrenals/Urinary Tract: Adrenal glands are normal. Kidneys are normal in size without hydronephrosis or nephrolithiasis. Stable 2.9 cm cyst over the lower pole right kidney. Ureters and bladder are normal. Stomach/Bowel: Stomach is normal. Small bowel is unremarkable. Moderate fecal retention within the colon most prominent over the rectosigmoid colon. Appendix not visualized. Vascular/Lymphatic: Mild calcified plaque over the abdominal aorta which is normal caliber. No adenopathy. Reproductive: Uterus and bilateral adnexa are  unremarkable. 1 cm calcified uterine fibroids present. Other: Moderate umbilical hernia containing peritoneal fat and fluid without significant change. There is an adjacent periumbilical hernia just left of midline  containing only peritoneal fat unchanged. No free peritoneal fluid or focal inflammatory change. Musculoskeletal: No focal abnormality. IMPRESSION: 1. No acute findings in the abdomen/pelvis. 2. Moderate fecal retention within the colon most prominent over the rectosigmoid colon. 3. Stable 2.9 cm right renal cyst. 4. Stable moderate umbilical hernia and stable adjacent periumbilical hernia just left of midline both containing only peritoneal fat. 5. Aortic atherosclerosis. 6. Emphysematous disease with associated mild interstitial prominence over the lung bases. Aortic Atherosclerosis (ICD10-I70.0) and Emphysema (ICD10-J43.9). Electronically Signed   By: Toribio Agreste M.D.   On: 05/03/2024 16:35       Assessment / Plan:    #47 68 year old African-American female with history of dementia, schizophrenia with psychosis of bipolar disorder who is brought to the emergency room due to family concerns of weakness, poor oral intake and blood in stool.  Patient has not had any evidence of active bleeding during this admission, and in fact stool was heme-negative yesterday though positive on admission.  On contrasted CT abdomen pelvis did show fecal retention in the colon in the rectosigmoid and umbilical/ventral hernias and was otherwise negative.  Hemoglobin has remained quite stable  Suspect that she has had constipation/obstipation and may have had some rectal irritation or even a small stercoral ulcer responsible for the rectal bleeding.  Of course cannot absolutely rule out other colonic mucosal lesions.  Patient refused to drink the bowel prep to attempt to purge her bowel last evening.  Declining colonoscopy or flexible sigmoidoscopy  I left a message for the patient's daughter today  #2 probable UTI #3.  Status postcholecystectomy #4.  History of EtOH use disorder #5 groin/labial skin breakdown-wound care following, this may have accounted for some of her symptoms as well.  Plan; will advance  back to regular diet Go back to twice daily MiraLAX  and water, hopefully she will drink that-could also put in juice No plans for attempt at endoscopic evaluation at this time, patient will not drink a prep. GI will sign off, available if needed       Principal Problem:   Acute GI bleeding Active Problems:   Recurrent spontaneous pneumothorax   Schizophrenia, paranoid type (HCC)   Severe manic bipolar 1 disorder with psychotic behavior (HCC)   Vascular dementia with delusions (HCC)   Hypertension   Severe alcohol use disorder, in sustained remission (HCC)   Constipation   Heme positive stool   Abdominal cramping     LOS: 0 days   Amy Esterwood  PA-C11/21/2025, 12:32 PM   Attending physician's note   I have taken a history, reviewed the chart, and examined the patient. I performed a substantive portion of this encounter, including complete performance of at least one of the key components, in conjunction with the APP. I agree with the APP's note, impression, and recommendations with my edits.  Patient refusing prep.  Hopefully she will at least take 1 capful of MiraLAX  twice daily to try to facilitate soft stools.  Otherwise no bleeding reported and H/H still normal/uptrending.  No role for endoscopic evaluation at this juncture (plus patient adamantly declines).  Inpatient GI service will sign off at this time.  [address] DO, FACG [phone] office

## 2024-05-05 NOTE — Progress Notes (Signed)
 Initial Nutrition Assessment  DOCUMENTATION CODES:   Underweight, Severe malnutrition in context of social or environmental circumstances  INTERVENTION:  Regular diet  Encourage PO intake  Ensure Plus High Protein po BID, each supplement provides 350 kcal and 20 grams of protein. Add MVI w/minerals daily    NUTRITION DIAGNOSIS:   Severe Malnutrition related to social / environmental circumstances as evidenced by energy intake < 75% for > or equal to 3 months, severe muscle depletion, severe fat depletion.   GOAL:   Patient will meet greater than or equal to 90% of their needs    MONITOR:   PO intake, Supplement acceptance, Weight trends  REASON FOR ASSESSMENT:   Malnutrition Screening Tool    ASSESSMENT:   medical history significant of hypertension, dementia, bipolar disorder, schizophrenia, recurrent pneumothorax, alcohol use, diverticulosis presenting with weakness and red/watery stool.  Patient seen in room. Evaluated by GI for rectal bleeding. Started on full liquids and advanced to regular diet for dinner tonight. Pt reports she was tolerating liquids. She reports PO intake has varied over the past few months, she will either have normal days where she eats three meals a day and other days where she will skip 1-2 meals. Pt has ensure bid ordered, drank half of chocolate this morning. Encouraged pt to continue to supplement ensure between meals, and to ordered preferred foods off room service menu.   24 hr recall: B - cereal or oatmeal with milk and juice  L - tuna or ham sandwich or a salad  D - baked chicken, string beans, macaroni and cheese    Admit weight: 49.6 kg  Current weight: 47.2 kg Pt reports UBW 110 lbs but unsure when she last weighed that amount, does report weight loss due to decreased PO intake. 5% weight loss x 6 months per chart hx, not clinically significant.   Nutritionally Relevant Medications: Reviewed   Labs Reviewed: K 3.4, Cr 1.08,  Calciu, 8.6, GFR 55    NUTRITION - FOCUSED PHYSICAL EXAM:  Flowsheet Row Most Recent Value  Orbital Region Severe depletion  Upper Arm Region Severe depletion  Thoracic and Lumbar Region Severe depletion  Buccal Region Severe depletion  Temple Region Severe depletion  Clavicle Bone Region Severe depletion  Clavicle and Acromion Bone Region Severe depletion  Scapular Bone Region Severe depletion  Dorsal Hand Severe depletion  Patellar Region Severe depletion  Anterior Thigh Region Severe depletion  Posterior Calf Region Severe depletion  Hair Reviewed  Eyes Reviewed  Mouth Reviewed  Skin Reviewed  Nails Reviewed    Diet Order:   Diet Order             Diet regular Room service appropriate? Yes; Fluid consistency: Thin  Diet effective now                   EDUCATION NEEDS:   Education needs have been addressed  Skin:  Skin Assessment: Reviewed RN Assessment  Last BM:  11/21  Height:   Ht Readings from Last 1 Encounters:  12/12/23 5' 4 (1.626 m)    Weight:   Wt Readings from Last 1 Encounters:  05/05/24 47.2 kg    Ideal Body Weight:  54.5 kg  BMI:  Body mass index is 17.86 kg/m.  Estimated Nutritional Needs:   Kcal:  8634-8364  Protein:  55-65 g  Fluid:  1.2-1.5 L/d  Madalyn Potters, MS, RD, LDN Clinical Dietitian  Contact via secure chat. If unavailable, use group chat RD Inpatient.

## 2024-05-05 NOTE — Plan of Care (Signed)

## 2024-05-05 NOTE — Progress Notes (Signed)
 Patient refused to drink the miralax  with gatorade during this shift. Offered the miralax  throughout the shift pt. refused. Patient stated it is nasty and there something mixed in there. I don't want it. Educated patient on the reason for drinking the Miralax  pt. Verbalized understanding and still refused to drink the Miralax .

## 2024-05-05 NOTE — TOC Initial Note (Signed)
 Transition of Care [facility]) - Initial/Assessment Note    Patient Details  Name: Carolyn Huynh MRN: [id number] Date of Birth: 07-13-1955  [facility]) CM/SW Contact:    Sherline Clack, LCSWA Phone Number: 05/05/2024, 5:00 PM  Clinical Narrative:                  CSW spoke with patient's daughters, [name] and [name], regarding guardianship, insurance, and wounds located around patient's groin.   Patient was living with daughter, [name], prior to hospitalization. Patient and daughter have been living together in [place] for about five years. [name] informed CSW patient talks to herself at home and is very secretive. Patient will talk to [place], [place] [employer] player from Ozell Jordan's time, and will make accusations based on what Scotty tells her, eg Scotty says you stole my social security check. Patient has gotten agitated with daughters in the past, eg. pushing. Daughters shared patient's sister, their cousin, and maternal grandmother's brother lived with schizophrenia as well.    According to [facility], patient will agree to take medications when she is in the hospital to be able to discharge home, and will then not follow medication regimen. When patient's daughters try to encourage her to take medications or go to the doctor, patient will say she is grown and can make her own decisions. Patient is not currently on any medication for her schizophrenia and is not following a mental health provider in the community. Daughters told CSW patient has been connected to community resources in the past, but patient will not follow up or will not let people into her home. Patient has been referred for Medicaid screenings in the past, but once financial counselors reach out to patient the patient will say she has insurance already and Medicaid application is canceled. APS has been contacted by providers on multiple occasions per daughters, but patient will not talk to the APS workers  and will not accept their help. [name] had attempted to apply for guardianship and at the time was told patient was able to make her own decisions (this was about five years ago). [name] and [name] think this hospitalization shows patient is not able to make her own decisions and hope to have evidence that will allow them to petition for guardianship once more.   When asked about patient's wounds around her groin area, [name] recounted she noticed patient was itching around her groin area and repeatedly asked patient why she was itching. Per [name], patient would say she had a rash and would become guarded. [name] noticed patient would hide in the bathroom and one day found patient in the bathroom with Ajax powder between her legs. [name] stated patient repeatedly said she would not go to the doctor to get her rash checked out, despite being heard whispering to herself she had a yeast infection and was hurting.   CSW informed daughter psych has been consulted by provider. CSW will reach out to daughters to share what psych recommends once their consult has been completed. CSW mentioned capacity evaluation to the provider and will await psychiatry's note. CSW will continue to follow current Medicaid screening and look for memory care placements for patient. CSW explained guardianship process to daughters, as well as HCPOA. Daughters do not think patient would agree to [employer]. Patient's support system consists of three daughters Phylis, [name], and [place]) as well as the daughters' dad who will send money to patient and [name] when he is able.    Expected  Discharge Plan: Skilled Nursing Facility, Memory Care Barriers to Discharge:  (Inpatient Pending Psych Consult)   Patient Goals and CMS Choice            Expected Discharge Plan and Services                                              Prior Living Arrangements/Services                       Activities of Daily Living    ADL Screening (condition at time of admission) Independently performs ADLs?: Yes (appropriate for developmental age) Is the patient deaf or have difficulty hearing?: Yes Does the patient have difficulty seeing, even when wearing glasses/contacts?: Yes Does the patient have difficulty concentrating, remembering, or making decisions?: No  Permission Sought/Granted                  Emotional Assessment   Attitude/Demeanor/Rapport: Unable to Assess Affect (typically observed): Unable to Assess Orientation: : Oriented to Self      Admission diagnosis:  Rectal bleeding [K62.5] Weakness [R53.1] Acute GI bleeding [K92.2] Patient Active Problem List   Diagnosis Date Noted   Constipation 05/04/2024   Heme positive stool 05/04/2024   Abdominal cramping 05/04/2024   Hypertension 05/03/2024   Acute GI bleeding 05/03/2024   Acute diverticulitis 12/12/2023   Diverticulitis 12/12/2023   Vascular dementia with delusions (HCC) 09/18/2021   Severe manic bipolar 1 disorder with psychotic behavior (HCC) 09/09/2021   Schizophrenia, paranoid type (HCC) 09/08/2021   History of alcohol use disorder 09/08/2021   Mixed Alzheimer's and vascular dementia (HCC) 09/08/2021   Severe alcohol use disorder, in sustained remission (HCC) 09/03/2019   Pneumothorax, right 02/01/2018   Recurrent spontaneous pneumothorax 01/31/2018   Pneumothorax on right 01/22/2018   Pneumothorax 01/15/2018   PCP:  Patient, [name] Pharmacy:   [id number] [place], [place] - [address] AT [facility] OF [address] [place] [address] Phone: [phone] Fax: [phone]  [facility] - [place] - [address] [place] [address] Phone: [phone] Fax: [phone]  [facility] [address] [place] [address] Phone: [phone] Fax:  [phone]     Social Drivers of Health (SDOH) Social History: SDOH Screenings   Food Insecurity: No Food Insecurity (05/03/2024)  Housing: Low Risk  (05/03/2024)  Transportation Needs: Unmet Transportation Needs (05/03/2024)  Utilities: Not At Risk (05/03/2024)  Alcohol Screen: Low Risk  (09/09/2021)  Social Connections: Unknown (05/03/2024)  Tobacco Use: Low Risk  (12/11/2023)   SDOH Interventions:     Readmission Risk Interventions    12/15/2023    4:42 PM  Readmission Risk Prevention Plan  Post Dischage Appt Complete  Medication Screening Complete  Transportation Screening Complete

## 2024-05-06 DIAGNOSIS — K922 Gastrointestinal hemorrhage, unspecified: Secondary | ICD-10-CM | POA: Diagnosis not present

## 2024-05-06 DIAGNOSIS — E43 Unspecified severe protein-calorie malnutrition: Secondary | ICD-10-CM | POA: Insufficient documentation

## 2024-05-06 LAB — URINE CULTURE

## 2024-05-06 MED ORDER — PALIPERIDONE ER 1.5 MG PO TB24
1.5000 mg | ORAL_TABLET | Freq: Every day | ORAL | Status: DC
  Administered 2024-05-06 – 2024-05-10 (×5): 1.5 mg via ORAL
  Filled 2024-05-06 (×6): qty 1

## 2024-05-06 MED ORDER — POTASSIUM CHLORIDE 20 MEQ PO PACK
20.0000 meq | PACK | Freq: Once | ORAL | Status: AC
  Administered 2024-05-06: 20 meq via ORAL
  Filled 2024-05-06: qty 1

## 2024-05-06 NOTE — Progress Notes (Signed)
 TRH   ROUNDING   NOTE Carolyn Huynh FMW:[id number]  DOB: [birth year]  DOA: 05/03/2024  PCP: Patient, [name]  05/06/2024,11:54 AM  LOS: 0 days    Code Status: Full code     from: Home   68 year old female Longstanding schizophrenia/delusional disorder/EtOH abuse with psychiatric hospitalization in 2023 Known history of pneumothorax with surgery to left chest in 2000 complicated by spontaneous pneumothoraces managed by Dr. Thurmon recent admission for this 2019 undergoing a right VATS with stapling of blebs and mechanical pleurodesis  Chronology  11/19 brought to ED from home with daughter increasing weakness red water stools-evaluation in the ED no obvious hemorrhoids had bright red blood per rectum-hemoglobin was 14 with WBC of 10-FOBT was positive Large leukocytes on urinalysis large hemoglobin as well   Pertinent imaging/studies till date  CT head without contrast no intracranial abnormality small matter changes with some small vessel ischemia CT abdomen pelvis no acute abnormalities moderate fecal retention 2.9 cm renal cyst moderate umbilical hernia adjacent periumbilical fat emphysema noted   Assessment  & Plan :    GI bleed?  Diverticular disease versus stercoral colitis-FOBT +2 days ago Large stool burden on CT Repeat FOBT 11/20 was neg GI signed off as patient refuses scope and will not drink prep Having multiple loose bowel movements so I have stopped twice daily MiraLAX  and patient will go to once daily MiraLAX  at home Bilateral groin wounds and breakdown Patient allegedly was cleaning her groin with Ajax-would not allow further follow-up The wounds are being cleansed with currently loss and dressings daily as per wound care No need for antibiotics will review wounds later today SVT this morning Had 7 beats-Will check magnesium  in a.m. and start low-dose Toprol if it recurs Schizophrenia delusional disorder Patient lacks capacity to make decisions Apparently not on  any meds?  See social work and psychiatry consult 11/21 and 11/22 Has been prescribed Invega  1.5 daily with the goal to transition to Invega  Sustenna for long-term relief and I have discussed this to some degree with the daughters-- Mild hypokalemia Recheck labs a.m. 2.9 cm renal cyst  Outpatient workup Previous pneumothoraces Moderate Alzheimer's She is able to orient to person place but seems more delusional than demented Severe P-EM BMI is 17   Overall she has a very poor prognosis-I will discuss further with the daughters  Data Reviewed today:   No labs today but last labs show stable hemoglobin and mild hypokalemia  DVT prophylaxis: scd  Status is: Observation The patient will require care spanning > 2 midnights and should be moved to inpatient because:     Dispo/Global plan:   See above---spoke with [name] as well as [name] her daughters on the phone  Time  60   Subjective:   Refuses to see psychiatry I do not need it Loose stools today   Objective + exam Vitals:   05/05/24 2008 05/06/24 0311 05/06/24 0824 05/06/24 1127  BP: 101/62 127/73 127/81 114/81  Pulse: 84 78 78 81  Resp: 18 18    Temp: 97.7 F (36.5 C) 98.2 F (36.8 C) 97.9 F (36.6 C) 97.6 F (36.4 C)  TempSrc: Oral  Oral Oral  SpO2: 97% 94% 97% 99%  Weight:       Filed Weights   05/05/24 1522  Weight: 47.2 kg     Examination: Cachectic black female no distress S1-S2 no murmur-7 beats of SVT earlier today ROM intact Abdomen soft no rebound no guarding Chest is clear  Scheduled Meds:  feeding supplement  237 mL Oral BID BM   multivitamin with minerals  1 tablet Oral Daily   paliperidone   1.5 mg Oral Daily   sodium chloride  flush  3 mL Intravenous Q12H   Continuous Infusions: acetaminophen  **OR** acetaminophen   Jai-Gurmukh Nickolas Chalfin, MD  [facility]

## 2024-05-06 NOTE — Discharge Instructions (Signed)
 ? ?  ? ?  Outpatient Psychiatry and Counseling ? ?[facility]: Mobile Crisis Management:  [phone] ? ?[facility])         ?[address] ?[place], [place] [address] ?[phone] ? ?[facility] [employer] sliding scale fee and walk in schedule: M-F 8am-12pm/1pm-3pm ?[address] ?[place], [place] [address] ?[phone] ? ?[facility]/ Intensive Outpatient Therapy Program ?[address] ?[place], [place] [address] ?[phone] ? ?[facility]   [facility] ?[address]   [address] ?[place], [place] [address]    [place], [place] [address] ?[phone]     [phone] ? ?[facility] ?[address] [facility] [address]  [address] ?[place] [address]    [place] [address] ?[phone]     [phone] ? ?Andee Poles, MD    [facility] ?[address]    [address] ?[place] [address]    [place] [address] ?[phone]       [phone] ? ?[facility]   [facility] ?[address]   [address] ?[place], [place] ?[phone]     [phone] ? ?[facility]    [employer] ?[address]    Eulogio Ditch, MD ?[place], [place]                  [address] ?[phone]     [place], [place] [address] ?[phone] ?[facility]: (Spanish speakin) ?[phone] ? ?[facility]    [facility] ?[address]    [address] ?[place], [place] [address]    [place], [place] [address] ?[phone]     [phone]  ?

## 2024-05-06 NOTE — Consult Note (Signed)
 [facility] Psychiatric Consult Initial  Patient Name: .Carolyn Huynh  MRN: [id number]  DOB: 11-10-1955  Consult Order details:  Orders (From admission, onward)     Start     Ordered   05/05/24 1340  IP CONSULT TO PSYCHIATRY       Ordering Provider: Samtani, Jai-Gurmukh, MD  Provider:  (Not yet assigned)  Question Answer Comment  Location [facility]   Reason for Consult? shcizooid--non complait on meds---needs to be seen--read my note pls      05/05/24 1341             Mode of Visit: In person    Psychiatry Consult Evaluation  Service Date: May 06, 2024 LOS:  LOS: 0 days  Chief Complaint the patient is a 68 year old female with a past history of schizoaffective disorder/schizophrenia and a history of mild cognitive impairment referred for medication management.  Primary Psychiatric Diagnoses  Schizophrenia, paranoid type 2.  Delusional disorder 3.  Vascular dementia, moderate  Assessment  Carolyn Huynh is a 68 y.o. female admitted: Medicallyfor 05/03/2024  4:48 AM for increasing weakness and watery stools.. She carries the psychiatric diagnoses of delusional disorder/schizophrenia and has a past medical history of vascular dementia.   The patient's current presentation with delusions, thinking that she is adopted and that she was born in [place] and she was fluent in Chinese is consistent with schizophrenia and delusional disorder.  Patient lacks insight and some of her delusions are quite fixed and have been consistent for many years.  Antipsychotics appear to help her when she is in the hospital but she has been noncompliant. Given her noncompliance we will consider starting her on Invega  and if she improves on this medication with no side effects we will consider Invega  Sustenna for long-term relief. Given her vascular dementia, antipsychotics will be used with caution. Full recommendations below.  Diagnoses:  Active Hospital problems: Principal  Problem:   Acute GI bleeding Active Problems:   Recurrent spontaneous pneumothorax   Schizophrenia, paranoid type (HCC)   Severe manic bipolar 1 disorder with psychotic behavior (HCC)   Vascular dementia with delusions (HCC)   Hypertension   Severe alcohol use disorder, in sustained remission (HCC)   Constipation   Heme positive stool   Abdominal cramping   Protein-calorie malnutrition, severe    Plan   ## Psychiatric Medication Recommendations:  Begin the Invega  1.5 mg a day.   ## Medical Decision Making Capacity: Not specifically addressed in this encounter However patient does not appear to have much of capacity to make informed decisions and it might be a good idea for the family to pursue guardianship.  ## Further Work-up:  -- Per the hospitalist EKG -- most recent EKG on 05/05/2024 had QtCb of 452 -- Pertinent labwork reviewed earlier this admission includes: As per the hospitalist - Recommend neurology follow-up for vascular dementia and possibly starting on medications if indicated.  ## Disposition:-- Plan Post Discharge/Psychiatric Care Follow-up resources if patient ends up on long-acting injectable, she will need routine follow-up with psychiatry.  Recommend TOCfollow-up prior to discharge  ## Behavioral / Environmental: -Delirium Precautions: Delirium Interventions for Nursing and Staff: - RN to open blinds every AM. - To Bedside: Glasses, hearing aide, and pt's own shoes. Make available to patients. when possible and encourage use. - Encourage po fluids when appropriate, keep fluids within reach. - OOB to chair with meals. - Passive ROM exercises to all extremities with AM & PM care. - RN  to assess orientation to person, time and place QAM and PRN. - Recommend extended visitation hours with familiar family/friends as feasible. - Staff to minimize disturbances at night. Turn off television when pt asleep or when not in use. or Utilize compassion and acknowledge the  patient's experiences while setting clear and realistic expectations for care.    ## Safety and Observation Level:  - Based on my clinical evaluation, I estimate the patient to be at low risk of self harm in the current setting. - At this time, we recommend  routine. This decision is based on my review of the chart including patient's history and current presentation, interview of the patient, mental status examination, and consideration of suicide risk including evaluating suicidal ideation, plan, intent, suicidal or self-harm behaviors, risk factors, and protective factors. This judgment is based on our ability to directly address suicide risk, implement suicide prevention strategies, and develop a safety plan while the patient is in the clinical setting. Please contact our team if there is a concern that risk level has changed.  CSSR Risk Category:C-SSRS RISK CATEGORY: No Risk  Suicide Risk Assessment: Patient has following modifiable risk factors for suicide: medication noncompliance and triggering events, which we are addressing by outpatient follow-up appointment and medication management. Patient has following non-modifiable or demographic risk factors for suicide: psychiatric hospitalization Patient has the following protective factors against suicide: Access to outpatient mental health care and Supportive family  Thank you for this consult request. Recommendations have been communicated to the primary team.  We will continue to follow at this time.   [name], MD       History of Present Illness  Relevant Aspects of [facility] Course:  Admitted on 05/03/2024 for weakness and nausea and vomiting. They the following her for her medical condition including pneumothorax, diverticulosis..   Patient Report:  The patient is a 68 year old female who lives with her daughter in [place]  but is originally from [place] .  Collateral from the family indicates that the  patient has a long history of alcoholism and psychosis they remember event when she was young, drinking beer and talking to herself.  She has always been delusional and tends to talk to herself all the time.  She had a few hospitalizations for psychosis in [place]  but was mainly hospitalized for her alcoholism, lung disease and other medical conditions.  She moved to [place]  to stay with one of her daughters.  Patient has 3 daughters.  Apparently she has been hospitalized twice in 2021 and 23 for psychosis but has been noncompliant with medications.  Her last hospitalization 20[facility] to [facility] and she was placed on Zyprexa  which she promptly stopped after discharge. 05/06/2024: The patient was seen and evaluated.  She is lying in bed and appears fairly emaciated and dehydrated.  She is oriented to place and person only.  She maintained fair to good eye contact.  Her speech was low volume without any looseness of associations or flight of ideas.  She reports she lives with her daughter in [place]  for the last 5 to 6 years.  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  When asked if you can speak Chinese she started speaking some gibberish that sounded like Chinese.  Although she did not get agitated she continued to ramble but in general was calm during this interview.  She is not a great historian and was unable to give  any details but gave me permission to talk to her daughters.  She denied any active SI/HI/AVH.  She has minimal insight and judgment and at the present time suspect that she lacks capacity to make informed decisions.  Will continue to evaluate her status after initiating a low-dose antipsychotic.  Psych ROS:  Depression: Denied Anxiety: Denied Mania (lifetime and current): Unknown Psychosis: (lifetime and current): Lifelong history of psychosis and delusions  Collateral information:  Contacted patient's 2  daughters Estefana and [name] at [phone] on 05/06/2024 Both the daughters are good historians and report that they have been taking care of the mother and that she has always been delusional and paranoid which seems to exacerbate when she is drinking.  They have been also trying to get guardianship but so far they do not have guardianship.  Patient lives with one of her daughters.  Review of Systems  Psychiatric/Behavioral:  Positive for hallucinations. The patient is nervous/anxious.      Psychiatric and Social History  Psychiatric History:  Information collected from medical records and family.  Prev Dx/Sx: Schizophrenia, delusional disorder, vascular dementia Current Psych Provider: Last hospitalized at [facility] Home Meds (current): Zyprexa , noncompliant Previous Med Trials: Unknown Therapy: Unknown  Prior Psych Hospitalization: At least 2 hospitalizations in [place]  in 2021 at [facility] in 2023 at [facility] Prior Self Harm: None Prior Violence: None  Family Psych History: Unknown Family Hx suicide: Unknown  Social History:  The patient is single, currently living with her daughter in [place] .  Prior to that she was living in [place]  with her other daughter. Access to weapons/lethal means: None  Substance History Extensive history of alcoholism.  Exam Findings  Physical Exam: Per the hospitalist Vital Signs:  Temp:  [97.4 F (36.3 C)-98.5 F (36.9 C)] 97.9 F (36.6 C) (11/22 0824) Pulse Rate:  [78-90] 78 (11/22 0824) Resp:  [18-20] 18 (11/22 0311) BP: (101-135)/(62-89) 127/81 (11/22 0824) SpO2:  [94 %-97 %] 97 % (11/22 0824) Weight:  [47.2 kg] 47.2 kg (11/21 1522) Blood pressure 127/81, pulse 78, temperature 97.9 F (36.6 C), temperature source Oral, resp. rate 18, weight 47.2 kg, SpO2 97%. Body mass index is 17.86 kg/m.  Physical Exam Constitutional:      Appearance: Normal appearance. She is ill-appearing.  Neurological:     Mental Status: She is  alert. Mental status is at baseline.     Mental Status Exam: General Appearance: Casual and Disheveled  Orientation:  Other:  Oriented to place and person only  Memory:  Immediate;   Fair Recent;   Poor Remote;   Poor  Concentration:  Concentration: Fair and Attention Span: Fair  Recall:  Poor  Attention  Poor  Eye Contact:  Fair  Speech:  Slow  Language:  Fair  Volume:  Decreased  Mood: Mildly labile  Affect:  Constricted and Restricted  Thought Process:  Disorganized  Thought Content:  Delusions and Paranoid Ideation  Suicidal Thoughts:  No  Homicidal Thoughts:  No  Judgement:  Impaired  Insight:  Lacking  Psychomotor Activity:  Decreased  Akathisia:  No  Fund of Knowledge:  Poor      Assets:  Communication Skills Housing Social Support  Cognition:  Impaired,  Moderate  ADL's:  Impaired  AIMS (if indicated):        Other History   These have been pulled in through the EMR, reviewed, and updated if appropriate.  Family History:  The patient's family history is not on file.  Medical History: Past Medical  History:  Diagnosis Date   Asthma    COPD (chronic obstructive pulmonary disease) (HCC)    Dementia (HCC)    Hypertension    Pneumothorax     Surgical History: Past Surgical History:  Procedure Laterality Date   CHEST TUBE INSERTION     VIDEO ASSISTED THORACOSCOPY Right 02/01/2018   Procedure: VIDEO ASSISTED THORACOSCOPY, BLEB STAPLING;  Surgeon: Fleeta Hanford Coy, MD;  Location: [facility] OR;  Service: Thoracic;  Laterality: Right;     Medications:   Current Facility-Administered Medications:    acetaminophen  (TYLENOL ) tablet 650 mg, 650 mg, Oral, Q6H PRN, 650 mg at 05/06/24 0514 **OR** acetaminophen  (TYLENOL ) suppository 650 mg, 650 mg, Rectal, Q6H PRN, Melvin, Alexander B, MD   feeding supplement (ENSURE PLUS HIGH PROTEIN) liquid 237 mL, 237 mL, Oral, BID BM, Melvin, Alexander B, MD, 237 mL at 05/05/24 1431   multivitamin with minerals tablet 1 tablet, 1  tablet, Oral, Daily, Samtani, Jai-Gurmukh, MD   paliperidone  (INVEGA ) 24 hr tablet 1.5 mg, 1.5 mg, Oral, Daily, Kratos Ruscitti, MD   polyethylene glycol (MIRALAX  / GLYCOLAX ) packet 17 g, 17 g, Oral, BID, Esterwood, Amy S, PA-C, 17 g at 05/05/24 2116   sodium chloride  flush (NS) 0.9 % injection 3 mL, 3 mL, Intravenous, Q12H, Melvin, Alexander B, MD, 3 mL at 05/05/24 2116  Allergies: Allergies  Allergen Reactions   Aspirin Shortness Of Breath   Shellfish Allergy Anaphylaxis   Ivp Dye [Iodinated Contrast Media] Other (See Comments)    Chest pain    [name], MD

## 2024-05-06 NOTE — Plan of Care (Signed)

## 2024-05-07 DIAGNOSIS — K922 Gastrointestinal hemorrhage, unspecified: Secondary | ICD-10-CM | POA: Diagnosis not present

## 2024-05-07 LAB — BASIC METABOLIC PANEL WITH GFR
Anion gap: 13 (ref 5–15)
BUN: 9 mg/dL (ref 8–23)
CO2: 27 mmol/L (ref 22–32)
Calcium: 8.4 mg/dL — ABNORMAL LOW (ref 8.9–10.3)
Chloride: 99 mmol/L (ref 98–111)
Creatinine, Ser: 0.84 mg/dL (ref 0.44–1.00)
GFR, Estimated: >60 mL/min (ref >60)
Glucose, Bld: 104 mg/dL — ABNORMAL HIGH (ref 70–99)
Potassium: 3.8 mmol/L (ref 3.5–5.1)
Sodium: 139 mmol/L (ref 135–145)

## 2024-05-07 MED ORDER — PIPERACILLIN-TAZOBACTAM 3.375 G IVPB
3.3750 g | Freq: Three times a day (TID) | INTRAVENOUS | Status: DC
  Administered 2024-05-07 – 2024-05-12 (×15): 3.375 g via INTRAVENOUS
  Filled 2024-05-07 (×15): qty 50

## 2024-05-07 NOTE — Plan of Care (Signed)

## 2024-05-07 NOTE — Consult Note (Signed)
 [facility] Consult Follow-up  Patient Name: .LENICE KOPER  MRN: [id number]  DOB: [birth year]  Consult Order details:  Orders (From admission, onward)     Start     Ordered   05/05/24 1340  IP CONSULT TO PSYCHIATRY       Ordering Provider: Samtani, Jai-Gurmukh, MD  Provider:  (Not yet assigned)  Question Answer Comment  Location [facility]   Reason for Consult? shcizooid--non complait on meds---needs to be seen--read my note pls      05/05/24 1341             Mode of Visit: In person    Psychiatry Consult Evaluation  Service Date: May 07, 2024 LOS:  LOS: 0 days  Chief Complaint the patient is a 68 year old female with a past history of schizoaffective disorder/schizophrenia and a history of mild cognitive impairment referred for medication management.  Primary Psychiatric Diagnoses  Schizophrenia, paranoid type 2.  Delusional disorder 3.  Vascular dementia, moderate  Assessment  EDIE VALLANDINGHAM is a 68 y.o. female admitted: Medicallyfor 05/03/2024  4:48 AM for increasing weakness and watery stools.. She carries the psychiatric diagnoses of delusional disorder/schizophrenia and has a past medical history of vascular dementia.   The patient's current presentation with delusions, thinking that she is adopted and that she was born in [place] and she was fluent in Chinese is consistent with schizophrenia and delusional disorder.  Patient lacks insight and some of her delusions are quite fixed and have been consistent for many years.  Antipsychotics appear to help her when she is in the hospital but she has been noncompliant. Given her noncompliance we will consider starting her on Invega  and if she improves on this medication with no side effects we will consider Invega  Sustenna for long-term relief. Given her vascular dementia, antipsychotics will be used with caution. Full recommendations below.  Diagnoses:  Active Hospital  problems: Principal Problem:   Acute GI bleeding Active Problems:   Recurrent spontaneous pneumothorax   Schizophrenia, paranoid type (HCC)   Severe manic bipolar 1 disorder with psychotic behavior (HCC)   Vascular dementia with delusions (HCC)   Hypertension   Severe alcohol use disorder, in sustained remission (HCC)   Constipation   Heme positive stool   Abdominal cramping   Protein-calorie malnutrition, severe    Plan   ## Psychiatric Medication Recommendations:  Continue with the Invega  1.5 mg a day. Consider Invega  LAI once patient is stabilized.  She can be referred to her outpatient psychiatric appointment for ongoing care.  ## Medical Decision Making Capacity: Not specifically addressed in this encounter However patient does not appear to have much of capacity to make informed decisions and it might be a good idea for the family to pursue guardianship.  ## Further Work-up:  -- Per the hospitalist EKG -- most recent EKG on 05/05/2024 had QtCb of 452 -- Pertinent labwork reviewed earlier this admission includes: As per the hospitalist - Recommend neurology follow-up for vascular dementia and possibly starting on medications if indicated.  ## Disposition:-- Plan Post Discharge/Psychiatric Care Follow-up resources if patient ends up on long-acting injectable, she will need routine follow-up with psychiatry.  Recommend TOCfollow-up prior to discharge  ## Behavioral / Environmental: -Delirium Precautions: Delirium Interventions for Nursing and Staff: - RN to open blinds every AM. - To Bedside: Glasses, hearing aide, and pt's own shoes. Make available to patients. when possible and encourage use. - Encourage po fluids when appropriate, keep fluids within reach. -  OOB to chair with meals. - Passive ROM exercises to all extremities with AM & PM care. - RN to assess orientation to person, time and place QAM and PRN. - Recommend extended visitation hours with familiar family/friends as  feasible. - Staff to minimize disturbances at night. Turn off television when pt asleep or when not in use. or Utilize compassion and acknowledge the patient's experiences while setting clear and realistic expectations for care.    ## Safety and Observation Level:  - Based on my clinical evaluation, I estimate the patient to be at low risk of self harm in the current setting. - At this time, we recommend  routine. This decision is based on my review of the chart including patient's history and current presentation, interview of the patient, mental status examination, and consideration of suicide risk including evaluating suicidal ideation, plan, intent, suicidal or self-harm behaviors, risk factors, and protective factors. This judgment is based on our ability to directly address suicide risk, implement suicide prevention strategies, and develop a safety plan while the patient is in the clinical setting. Please contact our team if there is a concern that risk level has changed.  CSSR Risk Category:C-SSRS RISK CATEGORY: No Risk  Suicide Risk Assessment: Patient has following modifiable risk factors for suicide: medication noncompliance and triggering events, which we are addressing by outpatient follow-up appointment and medication management. Patient has following non-modifiable or demographic risk factors for suicide: psychiatric hospitalization Patient has the following protective factors against suicide: Access to outpatient mental health care and Supportive family  Thank you for this consult request. Recommendations have been communicated to the primary team.  We will continue to follow at this time.   [name], MD       History of Present Illness  Relevant Aspects of [facility] Course:  Admitted on 05/03/2024 for weakness and nausea and vomiting. They the following her for her medical condition including pneumothorax, diverticulosis..   Patient Report:  The patient is a  68 year old female who lives with her daughter in [place]  but is originally from [place] .  Collateral from the family indicates that the patient has a long history of alcoholism and psychosis they remember event when she was young, drinking beer and talking to herself.  She has always been delusional and tends to talk to herself all the time.  She had a few hospitalizations for psychosis in [place]  but was mainly hospitalized for her alcoholism, lung disease and other medical conditions.  She moved to [place]  to stay with one of her daughters.  Patient has 3 daughters.  Apparently she has been hospitalized twice in 2021 and 23 for psychosis but has been noncompliant with medications.  Her last hospitalization 2023 to [facility] and she was placed on Zyprexa  which she promptly stopped after discharge. 05/06/2024: The patient was seen and evaluated.  She is lying in bed and appears fairly emaciated and dehydrated.  She is oriented to place and person only.  She maintained fair to good eye contact.  Her speech was low volume without any looseness of associations or flight of ideas.  She reports she lives with her daughter in [place]  for the last 5 to 6 years.  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  When asked if you can speak Chinese she started speaking some gibberish that sounded like Chinese.  Although she did not get agitated she continued to ramble but  in general was calm during this interview.  She is not a great historian and was unable to give any details but gave me permission to talk to her daughters.  She denied any active SI/HI/AVH.  She has minimal insight and judgment and at the present time suspect that she lacks capacity to make informed decisions.  Will continue to evaluate her status after initiating a low-dose antipsychotic. 05/07/2024: The patient was seen and reevaluated today.  She is alert oriented  and cooperative and in general is fairly pleasant without any agitation or behavioral disturbance.  However she remains delusional and still believes that she is a physician and that she was born and raised in [place].  Other than these fixed delusions patient denies any auditory visual hallucinations.  She denies suicidal ideations.  She has minimal insight and reports that she would prefer to live on her own and stay with her daughter.   Psych ROS:  Depression: Denied Anxiety: Denied Mania (lifetime and current): Unknown Psychosis: (lifetime and current): Lifelong history of psychosis and delusions  Collateral information:  Contacted patient's 2 daughters Estefana and [name] at [phone] on 05/06/2024 Both the daughters are good historians and report that they have been taking care of the mother and that she has always been delusional and paranoid which seems to exacerbate when she is drinking.  They have been also trying to get guardianship but so far they do not have guardianship.  Patient lives with one of her daughters. Her daughters are very supportive and are willing to be involved in discharge planning. Review of Systems  Psychiatric/Behavioral:  Positive for hallucinations. The patient is nervous/anxious.      Psychiatric and Social History  Psychiatric History:  Information collected from medical records and family.  Prev Dx/Sx: Schizophrenia, delusional disorder, vascular dementia Current Psych Provider: Last hospitalized at [facility]. Home Meds (current): Zyprexa , noncompliant Previous Med Trials: Unknown Therapy: Unknown  Prior Psych Hospitalization: At least 2 hospitalizations in [place]  in 2021 at [facility] in 2023 at [facility] Prior Self Harm: None Prior Violence: None  Family Psych History: Unknown Family Hx suicide: Unknown  Social History:  The patient is single, currently living with her daughter in [place] .  Prior to that she was living in [place]   with her other daughter. Access to weapons/lethal means: None  Substance History Extensive history of alcoholism.  Exam Findings  Physical Exam: Per the hospitalist Vital Signs:  Temp:  [97.6 F (36.4 C)-99.6 F (37.6 C)] 98 F (36.7 C) (11/23 0826) Pulse Rate:  [81-94] 85 (11/23 0826) Resp:  [18] 18 (11/23 0513) BP: (100-127)/(71-81) 115/71 (11/23 0826) SpO2:  [95 %-99 %] 97 % (11/23 0826) Blood pressure 115/71, pulse 85, temperature 98 F (36.7 C), temperature source Oral, resp. rate 18, weight 47.2 kg, SpO2 97%. Body mass index is 17.86 kg/m.  Physical Exam Constitutional:      Appearance: Normal appearance. She is ill-appearing.  Neurological:     Mental Status: She is alert. Mental status is at baseline.     Mental Status Exam: General Appearance: Casual and Disheveled  Orientation:  Other:  Oriented to place and person only  Memory:  Immediate;   Fair Recent;   Poor Remote;   Poor  Concentration:  Concentration: Fair and Attention Span: Fair  Recall:  Poor  Attention  Poor  Eye Contact:  Fair  Speech:  Slow  Language:  Fair  Volume:  Decreased  Mood: Mildly labile  Affect:  Constricted and Restricted  Thought Process:  Disorganized  Thought Content:  Delusions and Paranoid Ideation  Suicidal Thoughts:  No  Homicidal Thoughts:  No  Judgement:  Impaired  Insight:  Lacking  Psychomotor Activity:  Decreased  Akathisia:  No  Fund of Knowledge:  Poor      Assets:  Communication Skills Housing Social Support  Cognition:  Impaired,  Moderate  ADL's:  Impaired  AIMS (if indicated):        Other History   These have been pulled in through the EMR, reviewed, and updated if appropriate.  Family History:  The patient's family history is not on file.  Medical History: Past Medical History:  Diagnosis Date   Asthma    COPD (chronic obstructive pulmonary disease) (HCC)    Dementia (HCC)    Hypertension    Pneumothorax     Surgical History: Past  Surgical History:  Procedure Laterality Date   CHEST TUBE INSERTION     VIDEO ASSISTED THORACOSCOPY Right 02/01/2018   Procedure: VIDEO ASSISTED THORACOSCOPY, BLEB STAPLING;  Surgeon: Fleeta Hanford Coy, MD;  Location: [facility] OR;  Service: Thoracic;  Laterality: Right;     Medications:   Current Facility-Administered Medications:    acetaminophen  (TYLENOL ) tablet 650 mg, 650 mg, Oral, Q6H PRN, 650 mg at 05/06/24 0514 **OR** acetaminophen  (TYLENOL ) suppository 650 mg, 650 mg, Rectal, Q6H PRN, Melvin, Alexander B, MD   feeding supplement (ENSURE PLUS HIGH PROTEIN) liquid 237 mL, 237 mL, Oral, BID BM, Melvin, Alexander B, MD, 237 mL at 05/06/24 1411   multivitamin with minerals tablet 1 tablet, 1 tablet, Oral, Daily, Samtani, Jai-Gurmukh, MD, 1 tablet at 05/06/24 1206   paliperidone  (INVEGA ) 24 hr tablet 1.5 mg, 1.5 mg, Oral, Daily, Zamiyah Resendes, MD, 1.5 mg at 05/06/24 1635   sodium chloride  flush (NS) 0.9 % injection 3 mL, 3 mL, Intravenous, Q12H, Melvin, Alexander B, MD, 3 mL at 05/06/24 2043  Allergies: Allergies  Allergen Reactions   Aspirin Shortness Of Breath   Shellfish Allergy Anaphylaxis   Ivp Dye [Iodinated Contrast Media] Other (See Comments)    Chest pain    [name], MD

## 2024-05-07 NOTE — Consult Note (Signed)
 Reason for Consult:inflammation in groin Referring Physician: Royal MD  Carolyn Huynh is an 68 y.o. female. No obstetric history on file. Patient is postmenopausal and has purulent drainage in both groins, uncertain duration as patient is an unreliable historian. She is on antibiotics. No fever or significant pain  Pertinent Gynecological History: Sexually transmitted diseases: unknown Menstrual History: No LMP recorded. Patient is postmenopausal.    Past Medical History:  Diagnosis Date   Asthma    COPD (chronic obstructive pulmonary disease) (HCC)    Dementia (HCC)    Hypertension    Pneumothorax    Current Facility-Administered Medications:    acetaminophen  (TYLENOL ) tablet 650 mg, 650 mg, Oral, Q6H PRN, 650 mg at 05/06/24 0514 **OR** acetaminophen  (TYLENOL ) suppository 650 mg, 650 mg, Rectal, Q6H PRN, Melvin, Alexander B, MD   feeding supplement (ENSURE PLUS HIGH PROTEIN) liquid 237 mL, 237 mL, Oral, BID BM, Melvin, Alexander B, MD, 237 mL at 05/06/24 1411   multivitamin with minerals tablet 1 tablet, 1 tablet, Oral, Daily, Samtani, Jai-Gurmukh, MD, 1 tablet at 05/07/24 1317   paliperidone  (INVEGA ) 24 hr tablet 1.5 mg, 1.5 mg, Oral, Daily, Goli, Veeraindar, MD, 1.5 mg at 05/07/24 1317   piperacillin -tazobactam (ZOSYN ) IVPB 3.375 g, 3.375 g, Intravenous, Q8H, Samtani, Jai-Gurmukh, MD   sodium chloride  flush (NS) 0.9 % injection 3 mL, 3 mL, Intravenous, Q12H, Melvin, Alexander B, MD, 3 mL at 05/07/24 1318   Past Surgical History:  Procedure Laterality Date   CHEST TUBE INSERTION     VIDEO ASSISTED THORACOSCOPY Right 02/01/2018   Procedure: VIDEO ASSISTED THORACOSCOPY, BLEB STAPLING;  Surgeon: Fleeta Hanford Coy, MD;  Location: [facility] OR;  Service: Thoracic;  Laterality: Right;    No family history on file.  Social History:  reports that she has never smoked. She has never used smokeless tobacco. She reports that she does not currently use alcohol. She reports that she does not  use drugs.  Allergies:  Allergies  Allergen Reactions   Aspirin Shortness Of Breath   Shellfish Allergy Anaphylaxis    Medications: I have reviewed the patient's current medications.  Review of Systems  Constitutional:  Negative for unexpected weight change.  Respiratory: Negative.    Cardiovascular: Negative.   Gastrointestinal:  Positive for diarrhea.  Genitourinary:  Negative for menstrual problem, pelvic pain and vaginal pain.    Blood pressure 109/65, pulse 86, temperature 98 F (36.7 C), temperature source Oral, resp. rate 18, weight 47.2 kg, SpO2 97%. Physical Exam Vitals and nursing note reviewed. Exam conducted with a chaperone present.  Constitutional:      Appearance: She is ill-appearing.     Comments: underweight  HENT:     Head: Normocephalic and atraumatic.  Cardiovascular:     Rate and Rhythm: Normal rate.  Pulmonary:     Effort: Pulmonary effort is normal.  Abdominal:     General: Abdomen is flat.  Genitourinary:    Comments: Vulva not swollen or tender but bilateral scarring in both groin areas with some blood and possible pus c/w with chronic inflammation, no abscess. Vaginal atrophy Musculoskeletal:     Cervical back: Normal range of motion.  Neurological:     Mental Status: She is alert.  Psychiatric:        Mood and Affect: Mood normal.     Comments: Responses may not be appropriate      No results found for this or any previous visit (from the past 48 hours).  No results found.  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 Continue with this for now and may be able to see improvement with skin care in a few days We will follow up this week  Lynwood Solomons 05/07/2024

## 2024-05-07 NOTE — Progress Notes (Signed)
 [facility]   ROUNDING   NOTE Carolyn Huynh FMW:[id number]  DOB: [birth year]  DOA: 05/03/2024  PCP: Patient, [name]  05/07/2024,6:25 PM  LOS: 0 days    Code Status: Full code     from: Home   68 year old female Longstanding schizophrenia/delusional disorder/EtOH abuse with psychiatric hospitalization in 2023 Known history of pneumothorax with surgery to left chest in 2000 complicated by spontaneous pneumothoraces managed by Dr. Thurmon recent admission for this 2019 undergoing a right VATS with stapling of blebs and mechanical pleurodesis  Chronology  11/19 brought to ED from home with daughter increasing weakness red water stools-evaluation in the ED no obvious hemorrhoids had bright red blood per rectum-hemoglobin was 14 with WBC of 10-FOBT was positive Large leukocytes on urinalysis large hemoglobin as well   Pertinent imaging/studies till date  CT head without contrast no intracranial abnormality small matter changes with some small vessel ischemia CT abdomen pelvis no acute abnormalities moderate fecal retention 2.9 cm renal cyst moderate umbilical hernia adjacent periumbilical fat emphysema noted   Assessment  & Plan :    GI bleed?  Diverticular disease versus stercoral colitis-FOBT +2 days ago Large stool burden on CT Repeat FOBT 11/20 was neg GI signed off as patient refuses scope and will not drink prep MiraLAX  every other day scheduled as was having diarrhea with twice daily Bilateral groin wounds and breakdown Patient allegedly was cleaning her groin with Ajax-would not allow further follow-up Patient examined with chaperone today and oozing purulent discharge from both areas with blood coming out of the labia She does not have a contrast allergy is confirmed with Dr. Annis of radiology who recommends a CT pelvis to rule out deeper abscess, I have spoken with Dr. Lynwood Solomons of gynecology to evaluate patient for surgical management in case deep abscesses present Zosyn  3.375  every 12 has been ordered for antibiotic coverage at this time SVT this morning SVT has resolved is in sinus rhythm without other concerns Schizophrenia delusional disorder Patient lacks capacity to make decisions Apparently not on any meds?  See social work and psychiatry consult 11/21 and 11/22 Has been prescribed Invega  1.5 daily with the goal to transition to Invega  Sustenna for long-ter Social worker following up psychiatry continuing to see 2.9 cm renal cyst  Outpatient workup Previous pneumothoraces Moderate Alzheimer's Intermittently confused and talking Severe P-EM BMI is 17   Needs CT abdomen pelvis-her daughter Carolyn Huynh listed in the chart has been contacted on several days and I have updated her about today's findings  Data Reviewed today:   No labs today but last labs show stable hemoglobin and mild hypokalemia  DVT prophylaxis: scd  Status is: Observation The patient will require care spanning > 2 midnights and should be moved to inpatient because:     Dispo/Global plan:    Time  60   Subjective:   She seems about the same today She absolutely refutes that she was putting Ajax in her groin previously I examined her in setting of chaperone as below She has no pain no fever She is quite delusional still   Objective + exam Vitals:   05/07/24 0513 05/07/24 0826 05/07/24 1128 05/07/24 1717  BP: 125/76 115/71 109/65 113/66  Pulse: 88 85 86 83  Resp: 18     Temp: 98 F (36.7 C) 98 F (36.7 C)    TempSrc:  Oral    SpO2: 95% 97% 97% 99%  Weight:       [employer]  05/05/24 1522  Weight: 47.2 kg     Examination: Cachectic black female no distress S1-S2 no murmur-7 beats of SVT earlier today ROM intact Abdomen is soft When I examined her groin in the presence of chaperone she had liquid stool but purulent areas in the groin-when I cleaned the area with a wet cloth she had pus coming out of very small areas in the groin with blood coming out of the  same leading me to think that there is a deeper abscess inside I discussed these findings with GYN  Scheduled Meds:  multivitamin with minerals  1 tablet Oral Daily   paliperidone   1.5 mg Oral Daily   sodium chloride  flush  3 mL Intravenous Q12H   Continuous Infusions:  piperacillin -tazobactam (ZOSYN )  IV 3.375 g (05/07/24 1620)   acetaminophen  **OR** acetaminophen   Jai-Gurmukh Avontae Burkhead, MD  [facility]

## 2024-05-08 ENCOUNTER — Observation Stay (HOSPITAL_COMMUNITY): Payer: MEDICAID

## 2024-05-08 ENCOUNTER — Observation Stay (HOSPITAL_COMMUNITY): Payer: Self-pay

## 2024-05-08 DIAGNOSIS — E43 Unspecified severe protein-calorie malnutrition: Secondary | ICD-10-CM

## 2024-05-08 DIAGNOSIS — Z515 Encounter for palliative care: Secondary | ICD-10-CM

## 2024-05-08 DIAGNOSIS — Z7189 Other specified counseling: Secondary | ICD-10-CM

## 2024-05-08 LAB — COMPREHENSIVE METABOLIC PANEL WITH GFR
ALT: 8 U/L (ref 0–44)
AST: 16 U/L (ref 15–41)
Albumin: 2 g/dL — ABNORMAL LOW (ref 3.5–5.0)
Alkaline Phosphatase: 55 U/L (ref 38–126)
Anion gap: 9 (ref 5–15)
BUN: 9 mg/dL (ref 8–23)
CO2: 27 mmol/L (ref 22–32)
Calcium: 8.3 mg/dL — ABNORMAL LOW (ref 8.9–10.3)
Chloride: 101 mmol/L (ref 98–111)
Creatinine, Ser: 1.01 mg/dL — ABNORMAL HIGH (ref 0.44–1.00)
GFR, Estimated: 60 mL/min — ABNORMAL LOW (ref >60)
Glucose, Bld: 104 mg/dL — ABNORMAL HIGH (ref 70–99)
Potassium: 4 mmol/L (ref 3.5–5.1)
Sodium: 137 mmol/L (ref 135–145)
Total Bilirubin: 0.3 mg/dL (ref 0.0–1.2)
Total Protein: 7.6 g/dL (ref 6.5–8.1)

## 2024-05-08 LAB — CBC WITH DIFFERENTIAL/PLATELET
Abs Immature Granulocytes: 0.03 K/uL (ref 0.00–0.07)
Basophils Absolute: 0 K/uL (ref 0.0–0.1)
Basophils Relative: 1 %
Eosinophils Absolute: 0.5 K/uL (ref 0.0–0.5)
Eosinophils Relative: 7 %
HCT: 36.6 % (ref 36.0–46.0)
Hemoglobin: 11.9 g/dL — ABNORMAL LOW (ref 12.0–15.0)
Immature Granulocytes: 0 %
Lymphocytes Relative: 16 %
Lymphs Abs: 1.1 K/uL (ref 0.7–4.0)
MCH: 29.3 pg (ref 26.0–34.0)
MCHC: 32.5 g/dL (ref 30.0–36.0)
MCV: 90.1 fL (ref 80.0–100.0)
Monocytes Absolute: 0.7 K/uL (ref 0.1–1.0)
Monocytes Relative: 10 %
Neutro Abs: 4.8 K/uL (ref 1.7–7.7)
Neutrophils Relative %: 66 %
Platelets: 332 K/uL (ref 150–400)
RBC: 4.06 MIL/uL (ref 3.87–5.11)
RDW: 13.7 % (ref 11.5–15.5)
WBC: 7.2 K/uL (ref 4.0–10.5)
nRBC: 0 % (ref 0.0–0.2)

## 2024-05-08 MED ORDER — IOHEXOL 350 MG/ML SOLN
75.0000 mL | Freq: Once | INTRAVENOUS | Status: AC | PRN
  Administered 2024-05-08: 75 mL via INTRAVENOUS

## 2024-05-08 NOTE — Plan of Care (Signed)

## 2024-05-08 NOTE — Progress Notes (Addendum)
 [facility]   ROUNDING   NOTE BRIANI MAUL FMW:[id number]  DOB: 05-Jul-1955  DOA: 05/03/2024  PCP: Patient, [name]  05/08/2024,5:00 PM  LOS: 0 days    Code Status: Full code     from: Home   68 year old female Longstanding schizophrenia/delusional disorder/EtOH abuse with psychiatric hospitalization in 2023 Known history of pneumothorax with surgery to left chest in 2000 complicated by spontaneous pneumothoraces managed by Dr. Thurmon recent admission for this 2019 undergoing a right VATS with stapling of blebs and mechanical pleurodesis  Chronology  11/19 brought to ED from home with daughter increasing weakness red water stools-evaluation in the ED no obvious hemorrhoids had bright red blood per rectum-hemoglobin was 14 with WBC of 10-FOBT was positive Large leukocytes on urinalysis large hemoglobin as well   Pertinent imaging/studies till date  CT head without contrast no intracranial abnormality small matter changes with some small vessel ischemia CT abdomen pelvis no acute abnormalities moderate fecal retention 2.9 cm renal cyst moderate umbilical hernia adjacent periumbilical fat emphysema noted CT pelvis with contrast-shows skin thickening and areas of enhancement in inguinal folds with tiny gas bubbles probably gas trapped in the inguinal folds no rim-enhancing discrete abscess-concern for cellulitis-linear band of enhancing soft tissue attenuation deep to the right skin posteriorly of the intergluteal fold?  Perianal fistula pelvic MRI with and without contrast could be used to further characterize Prominent stool in pelvic colonic loops   Assessment  & Plan :    GI bleed?  Diverticular disease versus stercoral colitis-FOBT +2 days ago Large stool burden on CT Perianal fistula Repeat FOBT 11/20 was neg GI signed off as patient refuses scope and will not drink prep CT with contrast shows perianal fistula-have discussed with general surgery and as there is no clear drainable  abscess we will hold off on management of this currently and have this managed as an outpatient Patient does not have capacity to refuse medical interventions although may not cooperate with the same based on discussion with psychiatry Bilateral groin wounds and breakdown Patient allegedly was cleaning her groin with Ajax-would not allow further follow-up in the outpatient setting Reexamination 11/23 shows labia/introital lesions with oozing serosanguineous discharge and foul odor-I discussed the case with the GYN Dr. Zina on 11/24 and he says there is no role for surgical management of this at this time Wound nurse to be reconsulted with regards to updated recommendations-she has been on Zosyn  since 11/23 SVT this morning SVT has resolved is in sinus rhythm without other concerns Schizophrenia delusional disorder Patient lacks capacity to make decisions Apparently not on any meds?  See social work and psychiatry consult 11/21 and 11/22 Has been prescribed Invega  1.5 daily with the goal to transition to Invega  Sustenna for long-term Social worker following up psychiatry continuing to see 2.9 cm renal cyst  Outpatient workup Previous pneumothoraces Moderate Alzheimer's Intermittently confused and talking sometimes nonsensically but today she is redirectable Severe P-EM BMI is 17  D/w daughter clara in detail this evening conservative management-discussed overall that medically stabilizing will probably need several more days of antibiotics and will need wound care to reevaluate and no clear surgical plan in place Overall disposition is quite complicated and we will defer to social worker next apps as she is not safe for discharge in current state   Data Reviewed today:   Sodium 137 potassium 4.0 BUN/creatinine 9/1.0 WBC 7.2 hemoglobin 11.9 platelet 332  DVT prophylaxis: scd  Status is: Observation The patient will require care  spanning > 2 midnights and should be moved to inpatient  because:    Needs a clear and discernible plan of action   Time  60   Subjective:   Examined her groin today in presence of nursing and the wounds look better She is able to ambulate to the bathroom today with nursing staff   Objective + exam Vitals:   05/08/24 0127 05/08/24 0901 05/08/24 1152 05/08/24 1512  BP: 127/84 106/79 125/76 134/77  Pulse: 92 77 79 93  Resp:      Temp: 98.9 F (37.2 C) 97.8 F (36.6 C)  98.4 F (36.9 C)  TempSrc: Oral Oral  Oral  SpO2: 97% 98% 98% 94%  Weight:       Filed Weights   05/05/24 1522  Weight: 47.2 kg     Examination: Cachectic black female no distress S1-S2 no murmur no rub no gallop-sinus rhythm Abdomen is soft When I examined her groin in the presence of chaperone she had liquid stool but purulent areas in the groin  Scheduled Meds:  multivitamin with minerals  1 tablet Oral Daily   paliperidone   1.5 mg Oral Daily   sodium chloride  flush  3 mL Intravenous Q12H   Continuous Infusions:  piperacillin -tazobactam (ZOSYN )  IV 3.375 g (05/08/24 1403)   acetaminophen  **OR** acetaminophen   Jai-Gurmukh Naarah Borgerding, MD  [facility]

## 2024-05-08 NOTE — Consult Note (Addendum)
 [facility] Consult Follow-up  Patient Name: .Carolyn Huynh  MRN: [id number]  DOB: May 17, 1956  Consult Order details:  Orders (From admission, onward)     Start     Ordered   05/05/24 1340  IP CONSULT TO PSYCHIATRY       Ordering Provider: Samtani, Jai-Gurmukh, MD  Provider:  (Not yet assigned)  Question Answer Comment  Location [facility]   Reason for Consult? shcizooid--non complait on meds---needs to be seen--read my note pls      05/05/24 1341             Mode of Visit: In person    Psychiatry Consult Evaluation  Service Date: May 08, 2024 LOS:  LOS: 0 days  Chief Complaint the patient is a 68 year old female with a past history of schizoaffective disorder/schizophrenia and a history of mild cognitive impairment referred for medication management.  Primary Psychiatric Diagnoses  Schizophrenia, paranoid type 2.  Delusional disorder 3.  Vascular dementia, moderate  Assessment  Carolyn Huynh is a 68 y.o. female admitted: Medicallyfor 05/03/2024  4:48 AM for increasing weakness and watery stools.. She carries the psychiatric diagnoses of delusional disorder/schizophrenia and has a past medical history of vascular dementia.   The patient's current presentation with delusions, thinking that she is adopted and that she was born in [place] and she was fluent in Chinese is consistent with schizophrenia and delusional disorder.  Patient lacks insight and some of her delusions are quite fixed and have been consistent for many years.  Antipsychotics appear to help her when she is in the hospital but she has been noncompliant. Given her noncompliance we will consider starting her on Invega  and if she improves on this medication with no side effects we will consider Invega  Sustenna for long-term relief. Given her vascular dementia, antipsychotics will be used with caution. Full recommendations below.  Diagnoses:  Active Hospital  problems: Principal Problem:   Acute GI bleeding Active Problems:   Recurrent spontaneous pneumothorax   Schizophrenia, paranoid type (HCC)   Severe manic bipolar 1 disorder with psychotic behavior (HCC)   Vascular dementia with delusions (HCC)   Hypertension   Severe alcohol use disorder, in sustained remission (HCC)   Constipation   Heme positive stool   Abdominal cramping   Protein-calorie malnutrition, severe    Plan   ## Psychiatric Medication Recommendations:  Continue with the Invega  1.5 mg a day. Consider Invega  LAI once patient is stabilized.  She can be referred to her outpatient psychiatric appointment for ongoing care.  ## Medical Decision Making Capacity: Not specifically addressed in this encounter Patient does not have capacity to refuse medical care at this time.  ## Further Work-up:  -- Per the hospitalist EKG -- most recent EKG on 05/05/2024 had QtCb of 452 -- Pertinent labwork reviewed earlier this admission includes: As per the hospitalist - Recommend neurology follow-up for vascular dementia and possibly starting on medications if indicated.  ## Disposition:-- Plan Post Discharge/Psychiatric Care Follow-up resources if patient ends up on long-acting injectable, she will need routine follow-up with psychiatry.  Recommend TOCfollow-up prior to discharge  ## Behavioral / Environmental: -Delirium Precautions: Delirium Interventions for Nursing and Staff: - RN to open blinds every AM. - To Bedside: Glasses, hearing aide, and pt's own shoes. Make available to patients. when possible and encourage use. - Encourage po fluids when appropriate, keep fluids within reach. - OOB to chair with meals. - Passive ROM exercises to all extremities with AM &  PM care. - RN to assess orientation to person, time and place QAM and PRN. - Recommend extended visitation hours with familiar family/friends as feasible. - Staff to minimize disturbances at night. Turn off television when pt  asleep or when not in use. or Utilize compassion and acknowledge the patient's experiences while setting clear and realistic expectations for care.    ## Safety and Observation Level:  - Based on my clinical evaluation, I estimate the patient to be at low risk of self harm in the current setting. - At this time, we recommend  routine. This decision is based on my review of the chart including patient's history and current presentation, interview of the patient, mental status examination, and consideration of suicide risk including evaluating suicidal ideation, plan, intent, suicidal or self-harm behaviors, risk factors, and protective factors. This judgment is based on our ability to directly address suicide risk, implement suicide prevention strategies, and develop a safety plan while the patient is in the clinical setting. Please contact our team if there is a concern that risk level has changed.  CSSR Risk Category:C-SSRS RISK CATEGORY: No Risk  Suicide Risk Assessment: Patient has following modifiable risk factors for suicide: medication noncompliance and triggering events, which we are addressing by outpatient follow-up appointment and medication management. Patient has following non-modifiable or demographic risk factors for suicide: psychiatric hospitalization Patient has the following protective factors against suicide: Access to outpatient mental health care and Supportive family  Thank you for this consult request. Recommendations have been communicated to the primary team.  We will continue to follow at this time.   Carolyn LITTIE Glatter, DO       History of Present Illness  Relevant Aspects of [facility] Course:  Admitted on 05/03/2024 for weakness and nausea and vomiting. They the following her for her medical condition including pneumothorax, diverticulosis..   Patient Report:  The patient is a 68 year old female who lives with her daughter in [place]  but is  originally from [place] .  Collateral from the family indicates that the patient has a long history of alcoholism and psychosis they remember event when she was young, drinking beer and talking to herself.  She has always been delusional and tends to talk to herself all the time.  She had a few hospitalizations for psychosis in [place]  but was mainly hospitalized for her alcoholism, lung disease and other medical conditions.  She moved to [place]  to stay with one of her daughters.  Patient has 3 daughters.  Apparently she has been hospitalized twice in 2021 and 23 for psychosis but has been noncompliant with medications.  Her last hospitalization 2023 to [facility] and she was placed on Zyprexa  which she promptly stopped after discharge. 05/06/2024: The patient was seen and evaluated.  She is lying in bed and appears fairly emaciated and dehydrated.  She is oriented to place and person only.  She maintained fair to good eye contact.  Her speech was low volume without any looseness of associations or flight of ideas.  She reports she lives with her daughter in [place]  for the last 5 to 6 years.  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  When asked if you can speak Chinese she started speaking some gibberish that sounded like Chinese.  Although she did not get agitated she continued to ramble but in general was calm during this interview.  She is not a great historian  and was unable to give any details but gave me permission to talk to her daughters.  She denied any active SI/HI/AVH.  She has minimal insight and judgment and at the present time suspect that she lacks capacity to make informed decisions.  Will continue to evaluate her status after initiating a low-dose antipsychotic. 05/07/2024: The patient was seen and reevaluated today.  She is alert oriented and cooperative and in general is fairly pleasant without any agitation  or behavioral disturbance.  However she remains delusional and still believes that she is a physician and that she was born and raised in [place].  Other than these fixed delusions patient denies any auditory visual hallucinations.  She denies suicidal ideations.  She has minimal insight and reports that she would prefer to live on her own and stay with her daughter. 05/08/2024: The patient was seen and reevaluated today.  She is alert oriented and cooperative and in general is fairly pleasant without any agitation or behavioral disturbance. Patient remains delusional stating that she is a doctor. She does not have any insight into why she is in the hospital but she has been compliant with treatment and has not required any medications for agitation. The patient states that she does not need any medications for mental health and denies any SI/HI/AVH or concerns that the treatment team is trying to hurt her.   Psych ROS:  Depression: Denied Anxiety: Denied Mania (lifetime and current): Unknown Psychosis: (lifetime and current): Lifelong history of psychosis and delusions  Collateral information:  Contacted patient's 2 daughters Estefana and [name] at [phone] on 05/06/2024 Both the daughters are good historians and report that they have been taking care of the mother and that she has always been delusional and paranoid which seems to exacerbate when she is drinking.  They have been also trying to get guardianship but so far they do not have guardianship.  Patient lives with one of her daughters. Her daughters are very supportive and are willing to be involved in discharge planning.  Daughter [name] Cassis [phone] 05/08/2024 She reports that she has attempted to get guardianship over her mother in the past however she does not have the money to pay for a lawyer. She is asking for assistance with this process because she does not think that her mother will stay compliant with her treatment when she  leaves the hospital and in the past she has not been forthcoming with her discharge paperwork making it more difficult for the family to help her with treatment.    Review of Systems  Psychiatric/Behavioral:  Positive for hallucinations. The patient is nervous/anxious.      Psychiatric and Social History  Psychiatric History:  Information collected from medical records and family.  Prev Dx/Sx: Schizophrenia, delusional disorder, vascular dementia Current Psych Provider: Last hospitalized at [facility]. Home Meds (current): Zyprexa , noncompliant Previous Med Trials: Unknown Therapy: Unknown  Prior Psych Hospitalization: At least 2 hospitalizations in [place]  in 2021 at [facility] in 2023 at [facility] Prior Self Harm: None Prior Violence: None  Family Psych History: Unknown Family Hx suicide: Unknown  Social History:  The patient is single, currently living with her daughter in [place] .  Prior to that she was living in [place]  with her other daughter. Access to weapons/lethal means: None  Substance History Extensive history of alcoholism.  Exam Findings  Physical Exam: Per the hospitalist Vital Signs:  Temp:  [97.8 F (36.6 C)-98.9 F (37.2 C)] (P) 97.8 F (36.6 C) (11/24 0901) Pulse  Rate:  [77-96] (P) 77 (11/24 0901) Resp:  [18] 18 (11/23 2016) BP: (109-135)/(65-84) (P) 106/79 (11/24 0901) SpO2:  [94 %-99 %] (P) 98 % (11/24 0901) Blood pressure (P) 106/79, pulse (P) 77, temperature (P) 97.8 F (36.6 C), temperature source (P) Oral, resp. rate 18, weight 47.2 kg, SpO2 (P) 98%. Body mass index is 17.86 kg/m.  Physical Exam Constitutional:      Appearance: Normal appearance. She is ill-appearing.  Neurological:     Mental Status: She is alert. Mental status is at baseline.     Mental Status Exam: General Appearance: Casual and Disheveled  Orientation:  Other:  Oriented to place and person only  Memory:  Immediate;   Fair Recent;   Poor Remote;   Poor   Concentration:  Concentration: Fair and Attention Span: Fair  Recall:  Poor  Attention  Poor  Eye Contact:  Fair  Speech:  Slow  Language:  Fair  Volume:  Decreased  Mood: Mildly labile  Affect:  Constricted and Restricted  Thought Process:  Disorganized  Thought Content:  Delusions and Paranoid Ideation  Suicidal Thoughts:  No  Homicidal Thoughts:  No  Judgement:  Impaired  Insight:  Lacking  Psychomotor Activity:  Decreased  Akathisia:  No  Fund of Knowledge:  Poor      Assets:  Communication Skills Housing Social Support  Cognition:  Impaired,  Moderate  ADL's:  Impaired  AIMS (if indicated):        Other History   These have been pulled in through the EMR, reviewed, and updated if appropriate.  Family History:  The patient's family history is not on file.  Medical History: Past Medical History:  Diagnosis Date   Asthma    COPD (chronic obstructive pulmonary disease) (HCC)    Dementia (HCC)    Hypertension    Pneumothorax     Surgical History: Past Surgical History:  Procedure Laterality Date   CHEST TUBE INSERTION     VIDEO ASSISTED THORACOSCOPY Right 02/01/2018   Procedure: VIDEO ASSISTED THORACOSCOPY, BLEB STAPLING;  Surgeon: Fleeta Hanford Coy, MD;  Location: [facility] OR;  Service: Thoracic;  Laterality: Right;     Medications:   Current Facility-Administered Medications:    acetaminophen  (TYLENOL ) tablet 650 mg, 650 mg, Oral, Q6H PRN, 650 mg at 05/06/24 0514 **OR** acetaminophen  (TYLENOL ) suppository 650 mg, 650 mg, Rectal, Q6H PRN, Melvin, Alexander B, MD   iohexol  (OMNIPAQUE ) 350 MG/ML injection 75 mL, 75 mL, Intravenous, Once PRN, Samtani, Jai-Gurmukh, MD   multivitamin with minerals tablet 1 tablet, 1 tablet, Oral, Daily, Samtani, Jai-Gurmukh, MD, 1 tablet at 05/07/24 1317   paliperidone  (INVEGA ) 24 hr tablet 1.5 mg, 1.5 mg, Oral, Daily, Goli, Veeraindar, MD, 1.5 mg at 05/07/24 1317   piperacillin -tazobactam (ZOSYN ) IVPB 3.375 g, 3.375 g, Intravenous,  Q8H, Samtani, Jai-Gurmukh, MD, Last Rate: 12.5 mL/hr at 05/08/24 0518, 3.375 g at 05/08/24 0518   sodium chloride  flush (NS) 0.9 % injection 3 mL, 3 mL, Intravenous, Q12H, Melvin, Alexander B, MD, 3 mL at 05/07/24 2120  Allergies: Allergies  Allergen Reactions   Aspirin Shortness Of Breath   Shellfish Allergy Anaphylaxis    Kelsi Benham L Terrall Bley, DO

## 2024-05-08 NOTE — Progress Notes (Signed)
 05/08/2024 8:06 AM [facility] Brief Progress Note   _____________________________________________________________________________________________________________  [name] Patient DOB: 07/04/1955 Date: @TODAY @      Data: Received EPIC secure chat message from MD enquiring about STAT CT ordered 05/07/24.    Action: Contacted Radiology by phone.    Response:  Confirmed with radiology patient was called for CT last PM but patient declined. Radiology to reach back out to care team and re-attempt this morning.   _____________________________________________________________________________________________________________  [facility] RN Expeditor Jeren Dufrane, Corean Lobstein Please contact us  directly via secure chat (search for [facility]) or by calling us  at [phone] [facility]).

## 2024-05-08 NOTE — Consult Note (Signed)
 Consultation Note Date: 05/08/2024   [name]  DOB: 10-Sep-1955  MRN: [id number]  Age / Sex: 68 y.o., female  PCP: Patient, [name] Referring Physician: Royal Sill, MD  Reason for Consultation: Establishing goals of care  HPI/Patient Profile: 68 y.o. female   admitted on 05/03/2024 with past medical history significant for hypertension, dementia, bipolar disorder, schizophrenia, recurrent pneumothorax, alcohol use/long remission, diverticulosis presenting with weakness and watery red stool.  Admitted for treatment and stabilization.  Patient followed closely by psychiatry.    Clinical Assessment and Goals of Care:  This NP Ronal Plants reviewed medical records, received report from team, assessed the patient and then spoke to patient's 3 daughters Phylis Cassis, [place], Carolyn Huynh), by telephone to discuss diagnosis, prognosis, GOC, EOL wishes disposition and options.      (Patient is alert to person, continues with altered mental status.  Does not have medical decision-making capacity at this time.  Appears comfortable)   Concept of Palliative Care was introduced as specialized medical care for people and their families living with serious illness.  If focuses on providing relief from the symptoms and stress of a serious illness.  The goal is to improve quality of life for both the patient and the family. Values and goals of care important to patient and family were attempted to be elicited.  Created space and opportunity for  family to explore thoughts and feelings regarding current medical situation.  Daughters offer brief history.  Patient lived in [place]  until 5 years ago when she moved to [place]  to live with her daughter Carolyn.  They report life long history of mental illness/psychosis/ delusions/ paranoia.  Patient never had adequate  follow-up, she was noncompliant with recommendations.  Currently according to daughter that she lives with describes it as hard time, patient is obstinate, refuses to bathe, at times she wanders.  Family report attempting to get guardianship several years ago but cannot navigate the court system.  Conversation  had regarding the unlikelihood that patient can return to previous living situation when stable for discharge, secondary to complicated psychiatric history  Education offered today specifically to medical situation; dementia (education offered on natural trajectory and expectation as dementia progresses), underlying lung disease, and now labia infection/OB/GYN consulted.      A  discussion was had today regarding advanced directives.  Concepts specific to code status, artifical feeding and hydration, continued IV antibiotics and rehospitalization was had.    The difference between a aggressive medical intervention path  and a palliative comfort care path for this patient at this time was had.  Education offered on hospice benefit; philosophy and eligibility   At this time family request and are open to all offered and available medical interventions to prolong life.    They understand the patient may be difficult to work with and may verbally object to ongoing medical interventions, however due to the fact the patient lacks medical decision-making capacity family wish for medical interventions to continue despite the patient's  objection.        Questions and concerns addressed.  Patient  encouraged to call with questions or concerns.     PMT will continue to support holistically.   A family meeting is scheduled for tomorrow at 11:00. I have communicated this with treatment team to include attending,Psychiatry,  LCSW via secure chat. Will be present tomorrow for ongoing conversation regarding treatment option decisions advanced directive decisions and anticipatory care  needs.    There is no documented H POA or advance care planning documents.  All 3 daughters agree that [name] Cassis should be the main contact and decision maker for this patient.   All 3 daughters will work together for decisions focusing on best interest of this patient   Education offered on capacity versus competency in the process to begin process to secure guardianship.      SUMMARY OF RECOMMENDATIONS    Code Status/Advance Care Planning: Full code Educated patient/family to consider DNR/DNI status understanding evidenced based poor outcomes in similar hospitalized patient, as the cause of arrest is likely associated with advanced chronic illness rather than an easily reversible acute cardio-pulmonary event.   Symptom Management:  Per attending and psychiatry   Palliative Prophylaxis:  Delirium Protocol and Frequent Pain Assessment  Additional Recommendations (Limitations, Scope, Preferences): Full Scope Treatment  Psycho-social/Spiritual:  Desire for further Chaplaincy support:no Additional Recommendations: Education on Hospice  Prognosis:  Unable to determine  Discharge Planning: To Be Determined      Primary Diagnoses: Present on Admission:  Acute GI bleeding  Schizophrenia, paranoid type (HCC)  Severe manic bipolar 1 disorder with psychotic behavior (HCC)  Vascular dementia with delusions (HCC)  Recurrent spontaneous pneumothorax  Hypertension   I have reviewed the medical record, interviewed the patient and family, and examined the patient. The following aspects are pertinent.  Past Medical History:  Diagnosis Date   Asthma    COPD (chronic obstructive pulmonary disease) (HCC)    Dementia (HCC)    Hypertension    Pneumothorax    Social History   Socioeconomic History   Marital status: Significant Other    Spouse name: Not on file   Number of children: Not on file   Years of education: Not on file   Highest education level: Not on file   Occupational History   Not on file  Tobacco Use   Smoking status: Never   Smokeless tobacco: Never  Vaping Use   Vaping status: Never Used  Substance and Sexual Activity   Alcohol use: Not Currently   Drug use: Never   Sexual activity: Not Currently  Other Topics Concern   Not on file  Social History Narrative   Not on file   Social Drivers of Health   Financial Resource Strain: Not on file  Food Insecurity: No Food Insecurity (05/03/2024)   Hunger Vital Sign    Worried About Running Out of Food in the Last Year: Never true    Ran Out of Food in the Last Year: Never true  Transportation Needs: Unmet Transportation Needs (05/03/2024)   PRAPARE - Administrator, Civil Service (Medical): Yes    Lack of Transportation (Non-Medical): Yes  Physical Activity: Not on file  Stress: Not on file  Social Connections: Unknown (05/03/2024)   Social Connection and Isolation Panel    Frequency of Communication with Friends and Family: Never    Frequency of Social Gatherings with Friends and Family: Never    Attends Religious Services: Never  Active Member of Clubs or Organizations: No    Attends Banker Meetings: Never    Marital Status: Patient declined   No family history on file. Scheduled Meds:  multivitamin with minerals  1 tablet Oral Daily   paliperidone   1.5 mg Oral Daily   sodium chloride  flush  3 mL Intravenous Q12H   Continuous Infusions:  piperacillin -tazobactam (ZOSYN )  IV 3.375 g (05/08/24 0518)   PRN Meds:.acetaminophen  **OR** acetaminophen  Medications Prior to Admission:  Prior to Admission medications   Not on File   Allergies  Allergen Reactions   Aspirin Shortness Of Breath   Shellfish Allergy Anaphylaxis   Review of Systems  All other systems reviewed and are negative.   Physical Exam Constitutional:      Appearance: She is cachectic. She is ill-appearing.  Cardiovascular:     Rate and Rhythm: Normal rate.  Pulmonary:      Effort: Pulmonary effort is normal.  Neurological:     Mental Status: She is alert.  Psychiatric:        Speech: Speech normal.        Cognition and Memory: Cognition is impaired.     Vital Signs: BP (P) 106/79 (BP Location: Left Arm)   Pulse (P) 77   Temp (P) 97.8 F (36.6 C) (Oral)   Resp 18   Wt 47.2 kg   SpO2 (P) 98%   BMI 17.86 kg/m  Pain Scale: 0-10   Pain Score: 0-No pain   SpO2: SpO2: (P) 98 % O2 Device:SpO2: (P) 98 % O2 Flow Rate: .   IO: Intake/output summary: No intake or output data in the 24 hours ending 05/08/24 1053  LBM: Last BM Date : 05/07/24 Baseline Weight: Weight: 47.2 kg Most recent weight: Weight: 47.2 kg     Palliative Assessment/Data: 50 % at best   Discussed with Dr. Royal, LCSW and nursing via secure chat  Time:   90 minutes Signed by: Ronal Plants, NP   Please contact Palliative Medicine Team phone at [phone] for questions and concerns.  For individual provider: See Tracey

## 2024-05-09 DIAGNOSIS — K922 Gastrointestinal hemorrhage, unspecified: Secondary | ICD-10-CM | POA: Diagnosis not present

## 2024-05-09 DIAGNOSIS — F01B0 Vascular dementia, moderate, without behavioral disturbance, psychotic disturbance, mood disturbance, and anxiety: Secondary | ICD-10-CM | POA: Diagnosis not present

## 2024-05-09 DIAGNOSIS — J9383 Other pneumothorax: Secondary | ICD-10-CM

## 2024-05-09 DIAGNOSIS — F200 Paranoid schizophrenia: Secondary | ICD-10-CM | POA: Diagnosis not present

## 2024-05-09 DIAGNOSIS — F3113 Bipolar disorder, current episode manic without psychotic features, severe: Secondary | ICD-10-CM

## 2024-05-09 DIAGNOSIS — F0150 Vascular dementia without behavioral disturbance: Secondary | ICD-10-CM

## 2024-05-09 DIAGNOSIS — I10 Essential (primary) hypertension: Secondary | ICD-10-CM

## 2024-05-09 LAB — COMPREHENSIVE METABOLIC PANEL WITH GFR
ALT: 9 U/L (ref 0–44)
AST: 15 U/L (ref 15–41)
Albumin: 2 g/dL — ABNORMAL LOW (ref 3.5–5.0)
Alkaline Phosphatase: 54 U/L (ref 38–126)
Anion gap: 9 (ref 5–15)
BUN: 9 mg/dL (ref 8–23)
CO2: 28 mmol/L (ref 22–32)
Calcium: 8.5 mg/dL — ABNORMAL LOW (ref 8.9–10.3)
Chloride: 100 mmol/L (ref 98–111)
Creatinine, Ser: 1.02 mg/dL — ABNORMAL HIGH (ref 0.44–1.00)
GFR, Estimated: 59 mL/min — ABNORMAL LOW (ref >60)
Glucose, Bld: 141 mg/dL — ABNORMAL HIGH (ref 70–99)
Potassium: 3.9 mmol/L (ref 3.5–5.1)
Sodium: 137 mmol/L (ref 135–145)
Total Bilirubin: 0.4 mg/dL (ref 0.0–1.2)
Total Protein: 7.4 g/dL (ref 6.5–8.1)

## 2024-05-09 LAB — CBC WITH DIFFERENTIAL/PLATELET
Abs Immature Granulocytes: 0.02 K/uL (ref 0.00–0.07)
Basophils Absolute: 0 K/uL (ref 0.0–0.1)
Basophils Relative: 1 %
Eosinophils Absolute: 0.5 K/uL (ref 0.0–0.5)
Eosinophils Relative: 8 %
HCT: 36.6 % (ref 36.0–46.0)
Hemoglobin: 11.7 g/dL — ABNORMAL LOW (ref 12.0–15.0)
Immature Granulocytes: 0 %
Lymphocytes Relative: 18 %
Lymphs Abs: 1.2 K/uL (ref 0.7–4.0)
MCH: 29 pg (ref 26.0–34.0)
MCHC: 32 g/dL (ref 30.0–36.0)
MCV: 90.6 fL (ref 80.0–100.0)
Monocytes Absolute: 0.7 K/uL (ref 0.1–1.0)
Monocytes Relative: 11 %
Neutro Abs: 4 K/uL (ref 1.7–7.7)
Neutrophils Relative %: 62 %
Platelets: 362 K/uL (ref 150–400)
RBC: 4.04 MIL/uL (ref 3.87–5.11)
RDW: 13.7 % (ref 11.5–15.5)
WBC: 6.4 K/uL (ref 4.0–10.5)
nRBC: 0 % (ref 0.0–0.2)

## 2024-05-09 MED ORDER — GERHARDT'S BUTT CREAM
TOPICAL_CREAM | Freq: Two times a day (BID) | CUTANEOUS | Status: DC
  Administered 2024-05-13 – 2024-05-20 (×2): 1 via TOPICAL
  Filled 2024-05-09: qty 60

## 2024-05-09 NOTE — Consult Note (Signed)
 [facility] Consult Follow-up  Patient Name: .Carolyn Huynh  MRN: [id number]  DOB: 1956-02-21  Consult Order details:  Orders (From admission, onward)     Start     Ordered   05/05/24 1340  IP CONSULT TO PSYCHIATRY       Ordering Provider: Samtani, Jai-Gurmukh, MD  Provider:  (Not yet assigned)  Question Answer Comment  Location [facility]   Reason for Consult? shcizooid--non complait on meds---needs to be seen--read my note pls      05/05/24 1341             Mode of Visit: In person    Psychiatry Consult Evaluation  Service Date: May 09, 2024 LOS:  LOS: 1 day  Chief Complaint the patient is a 68 year old female with a past history of schizoaffective disorder/schizophrenia and a history of mild cognitive impairment referred for medication management.  Primary Psychiatric Diagnoses  Schizophrenia, paranoid type 2.  Delusional disorder 3.  Vascular dementia, moderate  Assessment  Carolyn Huynh is a 68 y.o. female admitted: Medicallyfor 05/03/2024  4:48 AM for increasing weakness and watery stools.. She carries the psychiatric diagnoses of delusional disorder/schizophrenia and has a past medical history of vascular dementia.   The patient's current presentation with delusions, thinking that she is adopted and that she was born in [place] and she was fluent in Chinese is consistent with schizophrenia and delusional disorder.  Patient lacks insight and some of her delusions are quite fixed and have been consistent for many years.  Antipsychotics appear to help her when she is in the hospital but she has been noncompliant. Given her noncompliance we will consider starting her on Invega  and if she improves on this medication with no side effects we will consider Invega  Sustenna for long-term relief. Given her vascular dementia, antipsychotics will be used with caution.  05/09/2024: A family meeting was scheduled with the LCSW, palliative care  the patient's children and this clinical research associate.  However the patient's children did not show up for the meeting.  Interviewed the patient and discussed the case with palliative care, Ronal Plants, NP and the social lenetta Clack, [place], [place].  All agree that the patient has multiple comorbidities along with her chronic psychiatric disease with fixed delusions.  She continues to lack capacity but has been refusing interventions and using poor judgment.  Ideally it would be better for the family to obtain guardianship but that might be a long process.  In the meantime, we will continue to treat with antipsychotic and transition to a long-acting injectable when appropriate.  Although this may help with some of her symptoms, it is unlikely that she will be significantly improved to make her own decisions. Full recommendations below.  Diagnoses:  Active Hospital problems: Principal Problem:   Acute GI bleeding Active Problems:   Recurrent spontaneous pneumothorax   Schizophrenia, paranoid type (HCC)   Severe manic bipolar 1 disorder with psychotic behavior (HCC)   Vascular dementia with delusions (HCC)   Hypertension   Severe alcohol use disorder, in sustained remission (HCC)   Constipation   Heme positive stool   Abdominal cramping   Protein-calorie malnutrition, severe    Plan   ## Psychiatric Medication Recommendations:  Continue with the Invega  1.5 mg a day. Consider Invega  LAI once patient is stabilized.  She can be referred to her outpatient psychiatric appointment for ongoing care.  ## Medical Decision Making Capacity: Not specifically addressed in this encounter Patient does not have capacity to  refuse medical care at this time.  ## Further Work-up:  -- Per the hospitalist EKG -- most recent EKG on 05/05/2024 had QtCb of 452 -- Pertinent labwork reviewed earlier this admission includes: As per the hospitalist - Recommend neurology follow-up for vascular dementia and  possibly starting on medications if indicated.  ## Disposition:-- Plan Post Discharge/Psychiatric Care Follow-up resources if patient ends up on long-acting injectable, she will need routine follow-up with psychiatry.  Recommend TOCfollow-up prior to discharge  ## Behavioral / Environmental: -Delirium Precautions: Delirium Interventions for Nursing and Staff: - RN to open blinds every AM. - To Bedside: Glasses, hearing aide, and pt's own shoes. Make available to patients. when possible and encourage use. - Encourage po fluids when appropriate, keep fluids within reach. - OOB to chair with meals. - Passive ROM exercises to all extremities with AM & PM care. - RN to assess orientation to person, time and place QAM and PRN. - Recommend extended visitation hours with familiar family/friends as feasible. - Staff to minimize disturbances at night. Turn off television when pt asleep or when not in use. or Utilize compassion and acknowledge the patient's experiences while setting clear and realistic expectations for care.    ## Safety and Observation Level:  - Based on my clinical evaluation, I estimate the patient to be at low risk of self harm in the current setting. - At this time, we recommend  routine. This decision is based on my review of the chart including patient's history and current presentation, interview of the patient, mental status examination, and consideration of suicide risk including evaluating suicidal ideation, plan, intent, suicidal or self-harm behaviors, risk factors, and protective factors. This judgment is based on our ability to directly address suicide risk, implement suicide prevention strategies, and develop a safety plan while the patient is in the clinical setting. Please contact our team if there is a concern that risk level has changed.  CSSR Risk Category:C-SSRS RISK CATEGORY: No Risk  Suicide Risk Assessment: Patient has following modifiable risk factors for suicide:  medication noncompliance and triggering events, which we are addressing by outpatient follow-up appointment and medication management. Patient has following non-modifiable or demographic risk factors for suicide: psychiatric hospitalization Patient has the following protective factors against suicide: Access to outpatient mental health care and Supportive family  Thank you for this consult request. Recommendations have been communicated to the primary team.  We will continue to follow at this time.   [name], MD       History of Present Illness  Relevant Aspects of [facility] Hospital Course:  Admitted on 05/03/2024 for weakness and nausea and vomiting. They the following her for her medical condition including pneumothorax, diverticulosis..   Patient Report:  The patient is a 68 year old female who lives with her daughter in [place]  but is originally from [place] .  Collateral from the family indicates that the patient has a long history of alcoholism and psychosis they remember event when she was young, drinking beer and talking to herself.  She has always been delusional and tends to talk to herself all the time.  She had a few hospitalizations for psychosis in [place]  but was mainly hospitalized for her alcoholism, lung disease and other medical conditions.  She moved to [place]  to stay with one of her daughters.  Patient has 3 daughters.  Apparently she has been hospitalized twice in 2021 and 23 for psychosis but has been noncompliant with medications.  Her last hospitalization 2023 to [facility]  and she was placed on Zyprexa  which she promptly stopped after discharge. 05/06/2024: The patient was seen and evaluated.  She is lying in bed and appears fairly emaciated and dehydrated.  She is oriented to place and person only.  She maintained fair to good eye contact.  Her speech was low volume without any looseness of associations or flight of ideas.  She reports she  lives with her daughter in [place]  for the last 5 to 6 years.  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  When asked if you can speak Chinese she started speaking some gibberish that sounded like Chinese.  Although she did not get agitated she continued to ramble but in general was calm during this interview.  She is not a great historian and was unable to give any details but gave me permission to talk to her daughters.  She denied any active SI/HI/AVH.  She has minimal insight and judgment and at the present time suspect that she lacks capacity to make informed decisions.  Will continue to evaluate her status after initiating a low-dose antipsychotic. 05/07/2024: The patient was seen and reevaluated today.  She is alert oriented and cooperative and in general is fairly pleasant without any agitation or behavioral disturbance.  However she remains delusional and still believes that she is a physician and that she was born and raised in [place].  Other than these fixed delusions patient denies any auditory visual hallucinations.  She denies suicidal ideations.  She has minimal insight and reports that she would prefer to live on her own and stay with her daughter. 05/08/2024: The patient was seen and reevaluated today.  She is alert oriented and cooperative and in general is fairly pleasant without any agitation or behavioral disturbance. Patient remains delusional stating that she is a doctor. She does not have any insight into why she is in the hospital but she has been compliant with treatment and has not required any medications for agitation. The patient states that she does not need any medications for mental health and denies any SI/HI/AVH or concerns that the treatment team is trying to hurt her.  05/09/2024: The patient is seen and evaluated today.  In addition discussed with the team.  The patient is oriented and in general  cooperative and fairly pleasant until discussing her treatment plan.  She then becomes very aggressive verbally and claims that she is a clinical research associate and a doctor and that she was born in [place] and started showing some agitation.  She shows very poor insight and judgment.  However she clearly denies any active SI/HI/AVH.  Psych ROS:  Depression: Denied Anxiety: Denied Mania (lifetime and current): Unknown Psychosis: (lifetime and current): Lifelong history of psychosis and delusions  Collateral information:  Contacted patient's 2 daughters Estefana and [name] at [phone] on 05/06/2024 Both the daughters are good historians and report that they have been taking care of the mother and that she has always been delusional and paranoid which seems to exacerbate when she is drinking.  They have been also trying to get guardianship but so far they do not have guardianship.  Patient lives with one of her daughters. Her daughters are very supportive and are willing to be involved in discharge planning.  Daughter [name] Cassis [phone] 05/08/2024 She reports that she has attempted to get guardianship over her mother in the past however she does not have the money to pay  for a clinical research associate. She is asking for assistance with this process because she does not think that her mother will stay compliant with her treatment when she leaves the hospital and in the past she has not been forthcoming with her discharge paperwork making it more difficult for the family to help her with treatment.    Review of Systems  Psychiatric/Behavioral:  Positive for hallucinations. The patient is nervous/anxious.      Psychiatric and Social History  Psychiatric History:  Information collected from medical records and family.  Prev Dx/Sx: Schizophrenia, delusional disorder, vascular dementia Current Psych Provider: Last hospitalized at [facility]. Home Meds (current): Zyprexa , noncompliant Previous Med Trials: Unknown Therapy:  Unknown  Prior Psych Hospitalization: At least 2 hospitalizations in [place]  in 2021 at [facility] in 2023 at [facility] Prior Self Harm: None Prior Violence: None  Family Psych History: Unknown Family Hx suicide: Unknown  Social History:  The patient is single, currently living with her daughter in [place] .  Prior to that she was living in [place]  with her other daughter. Access to weapons/lethal means: None  Substance History Extensive history of alcoholism.  Exam Findings  Physical Exam: Per the hospitalist Vital Signs:  Temp:  [98.1 F (36.7 C)-98.6 F (37 C)] 98.1 F (36.7 C) (11/25 0753) Pulse Rate:  [80-93] 80 (11/25 0753) Resp:  [18-20] 18 (11/25 0753) BP: (108-136)/(64-79) 123/76 (11/25 0753) SpO2:  [92 %-96 %] 95 % (11/25 0753) Blood pressure 123/76, pulse 80, temperature 98.1 F (36.7 C), resp. rate 18, weight 47.2 kg, SpO2 95%. Body mass index is 17.86 kg/m.  Physical Exam Constitutional:      Appearance: Normal appearance. She is ill-appearing.  Neurological:     Mental Status: She is alert. Mental status is at baseline.     Mental Status Exam: General Appearance: Casual and Disheveled  Orientation:  Other:  Oriented to place and person only  Memory:  Immediate;   Fair Recent;   Poor Remote;   Poor  Concentration:  Concentration: Fair and Attention Span: Fair  Recall:  Poor  Attention  Poor  Eye Contact:  Fair  Speech:  Slow  Language:  Fair  Volume:  Decreased  Mood: Mildly labile  Affect:  Constricted and Restricted  Thought Process:  Disorganized  Thought Content:  Delusions and Paranoid Ideation  Suicidal Thoughts:  No  Homicidal Thoughts:  No  Judgement:  Impaired  Insight:  Lacking  Psychomotor Activity:  Decreased  Akathisia:  No  Fund of Knowledge:  Poor      Assets:  Communication Skills Housing Social Support  Cognition:  Impaired,  Moderate  ADL's:  Impaired  AIMS (if indicated):        Other History    These have been pulled in through the EMR, reviewed, and updated if appropriate.  Family History:  The patient's family history is not on file.  Medical History: Past Medical History:  Diagnosis Date   Asthma    COPD (chronic obstructive pulmonary disease) (HCC)    Dementia (HCC)    Hypertension    Pneumothorax     Surgical History: Past Surgical History:  Procedure Laterality Date   CHEST TUBE INSERTION     VIDEO ASSISTED THORACOSCOPY Right 02/01/2018   Procedure: VIDEO ASSISTED THORACOSCOPY, BLEB STAPLING;  Surgeon: Fleeta Hanford Coy, MD;  Location: [facility] OR;  Service: Thoracic;  Laterality: Right;     Medications:   Current Facility-Administered Medications:    acetaminophen  (TYLENOL ) tablet 650 mg, 650 mg, Oral,  Q6H PRN, 650 mg at 05/09/24 0634 **OR** acetaminophen  (TYLENOL ) suppository 650 mg, 650 mg, Rectal, Q6H PRN, Melvin, Alexander B, MD   Gerhardt's butt cream, , Topical, BID, Samtani, Jai-Gurmukh, MD   multivitamin with minerals tablet 1 tablet, 1 tablet, Oral, Daily, Samtani, Jai-Gurmukh, MD, 1 tablet at 05/09/24 1012   paliperidone  (INVEGA ) 24 hr tablet 1.5 mg, 1.5 mg, Oral, Daily, Bocephus Cali, MD, 1.5 mg at 05/09/24 1006   piperacillin -tazobactam (ZOSYN ) IVPB 3.375 g, 3.375 g, Intravenous, Q8H, Samtani, Jai-Gurmukh, MD, Last Rate: 12.5 mL/hr at 05/09/24 0516, 3.375 g at 05/09/24 0516   sodium chloride  flush (NS) 0.9 % injection 3 mL, 3 mL, Intravenous, Q12H, Melvin, Alexander B, MD, 3 mL at 05/09/24 1013  Allergies: Allergies  Allergen Reactions   Aspirin Shortness Of Breath   Shellfish Allergy Anaphylaxis    [name], MD

## 2024-05-09 NOTE — Consult Note (Signed)
 WOC Nurse Consult Note:  Reason for Consult: open wound newly found to right lower buttock Wound is located near ischial crease, patient was seen earlier regarding moisture associated dermatitis with open wounds Wound type: chronic appearing open wound that could be related to PI; stage 4, full-thickness skin loss, may have been present for some time. Pressure Injury POA: No Measurement: approx 2x2x1 cm Wound bed: red, pink, open cavity, undermining  Periwound:scar tissue and induration Recommend Vashe to disinfect and provide moist healing, not able to use Iodoform packing due to iodine and shellfish allergy Dressing procedure/placement/frequency: BID  Cleanse right lower buttock wound with normal saline, pat dry and Vashe solution #[id number] to small gauze packing wound, use as wet to dry dressing, cover with Mepilex or ABD pad with tape. Weekly images of wound.       WOC will follow patient and see within 10 days.  Please reconsult if wound worsens in condition and notify provider.   Sherrilyn Hals MSN RN CWOCN WOC [facility]  [phone] (Available from 7-3 pm Mon-Friday)

## 2024-05-09 NOTE — Plan of Care (Signed)

## 2024-05-09 NOTE — Progress Notes (Signed)
 [facility]   ROUNDING   NOTE Carolyn Huynh FMW:[id number]  DOB: 08-Nov-1955  DOA: 05/03/2024  PCP: Patient, [name]  05/09/2024,8:09 PM  LOS: 1 day    Code Status: Full code     from: Home   68 year old female Longstanding schizophrenia/delusional disorder/EtOH abuse with psychiatric hospitalization in 2023 Known history of pneumothorax with surgery to left chest in 2000 complicated by spontaneous pneumothoraces managed by Dr. Thurmon recent admission for this 2019 undergoing a right VATS with stapling of blebs and mechanical pleurodesis  Chronology  11/19 brought to ED from home with daughter increasing weakness red water stools-evaluation in the ED no obvious hemorrhoids had bright red blood per rectum-hemoglobin was 14 with WBC of 10-FOBT was positive Large leukocytes on urinalysis large hemoglobin as well   Pertinent imaging/studies till date  CT head without contrast no intracranial abnormality small matter changes with some small vessel ischemia CT abdomen pelvis no acute abnormalities moderate fecal retention 2.9 cm renal cyst moderate umbilical hernia adjacent periumbilical fat emphysema noted CT pelvis with contrast-shows skin thickening and areas of enhancement in inguinal folds with tiny gas bubbles probably gas trapped in the inguinal folds no rim-enhancing discrete abscess-concern for cellulitis-linear band of enhancing soft tissue attenuation deep to the right skin posteriorly of the intergluteal fold?  Perianal fistula pelvic MRI with and without contrast could be used to further characterize Prominent stool in pelvic colonic loops   Assessment  & Plan :    GI bleed?  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  Wound care nurse consulted and recommends normal saline pat dry Vashe solution small gauze packing with Mepilex and weekly images of wound and they will not follow This needs to be reevaluated by attending physician and wound nurse in conjunction given high risk for breakdown and chronic constipation with need of intermittent laxatives Bilateral groin wounds and breakdown Patient allegedly was cleaning her groin with Ajax-would not allow further follow-up in the outpatient setting Reexamination 11/23 shows labia/introital lesions with oozing serosanguineous discharge and foul odor-I discussed the case with the GYN Dr. Zina on 11/24 and he says there is no role for surgical management of this at this time Wound nurse reevaluation on 11/25 recommends dry well with gauze apply thin layer of Gerhardt's to the area all denuded skin sites and provide buffer to dual incontinence and repositioning I have placed the patient on Zosyn  which can be transitioned in the next several days depending on how the wounds look to Augmentin  would treat until the areas dry up-very high risk for recurrence SVT earlier in the hospital stay SVT has resolved i Schizophrenia delusional disorder Patient lacks capacity to make decisions Apparently not on any meds?  See social work and psychiatry consult 11/21 and 11/22 Has been prescribed Invega  1.5 daily with the goal to transition to Invega  [facility] for long-term Social worker following up psychiatry continuing to see-family did not show up for meeting that was scheduled today with care team 11/25-patient is on Invega   and will continue this going forward until we can find a safe disposition and we are attempting to  obtain guardianship 2.9 cm renal cyst  Outpatient workup Previous pneumothoraces Moderate Alzheimer's Intermittently confused and talking sometimes nonsensically but today she is redirectable Severe P-EM BMI is 17  D/w daughter Carolyn Huynh in detail over the past several days-social worker and palliative care as well as psychiatry are trying to enlist their input to suggest family meetings   Data Reviewed today:   Sodium 137 potassium 3.9 CO2 28 BUN/creatinine 9/1.0 WBC 6.4 hemoglobin 11.7 platelet 362  DVT prophylaxis: scd  Status is: Observation The patient will require care spanning > 2 midnights and should be moved to inpatient because:    Needs a clear and discernible plan of action   Time  60   Subjective:   She does not really want to talk to me today-she tells me she is doing fine and that she got up When I ask her what her plan is regarding going home she says I have spoken to you I do not need to say more    Objective + exam Vitals:   05/09/24 0426 05/09/24 0753 05/09/24 1648 05/09/24 1934  BP: 116/76 123/76 114/76 129/75  Pulse: 85 80 81 86  Resp: 20 18 18 17   Temp: 98.6 F (37 C) 98.1 F (36.7 C) 97.8 F (36.6 C) 97.7 F (36.5 C)  TempSrc:      SpO2: 92% 95% 94% 96%  Weight:       Filed Weights   05/05/24 1522  Weight: 47.2 kg     Examination: Cachectic black female no distress S1-S2 no murmur no rub no gallop-sinus rhythm Abdomen is soft I did not examine her groin or buttocks today  Scheduled Meds:  Gerhardt's butt cream   Topical BID   multivitamin with minerals  1 tablet Oral Daily   paliperidone   1.5 mg Oral Daily   sodium chloride  flush  3 mL Intravenous Q12H   Continuous Infusions:  piperacillin -tazobactam (ZOSYN )  IV 3.375 g (05/09/24 1357)   acetaminophen  **OR** acetaminophen   Colen Grimes, MD  [facility]

## 2024-05-09 NOTE — Progress Notes (Signed)
 Daily Progress Note   [name]       Date: 05/09/2024 DOB: 1955-10-01  Age: 68 y.o. MRN#: [id number] Attending Physician: Samtani, Jai-Gurmukh, MD Primary Care Physician: Patient, [name] Admit Date: 05/03/2024  Reason for Consultation/Follow-up: Establishing goals of care  Patient profile: 68 y.o. female   admitted on 05/03/2024 with past medical history significant for hypertension, dementia, bipolar disorder, schizophrenia, recurrent pneumothorax, alcohol use/long remission, diverticulosis presenting with weakness and watery red stool.  Today is day 6 of this hospitalization.  Multiple comorbidities; large stool burden on CT, perianal fistula, bilateral groin wounds and breakdown complicated by patient's underlying psychiatric disorders.  Patient lacks capacity but is refusing recommended medical interventions.  Decisions regarding treatment options decision, advanced directive decisions and anticipatory care needs ending.   Patient followed closely by psychiatry.  Subjective:  Patient is alert, oriented only to self.  Today she tells me she has a people at in the right side of her head.  She does not have any insight into why she is here in the hospital.  Patient does not have medical decision-making capacity at this time   Length of Stay: 1  Current Medications: Scheduled Meds:   Gerhardt's butt cream   Topical BID   multivitamin with minerals  1 tablet Oral Daily   paliperidone   1.5 mg Oral Daily   sodium chloride  flush  3 mL Intravenous Q12H    Continuous Infusions:  piperacillin -tazobactam (ZOSYN )  IV 3.375 g (05/09/24 0516)    PRN Meds: acetaminophen  **OR** acetaminophen   Physical Exam Cardiovascular:     Rate and Rhythm: Normal rate.   Pulmonary:     Effort: Pulmonary effort is normal.  Skin:    General: Skin is warm and dry.  Neurological:     Mental Status: She is alert.  Psychiatric:        Cognition and Memory: Cognition is impaired.        Judgment: Judgment is inappropriate.             Vital Signs: BP 123/76 (BP Location: Left Arm)   Pulse 80   Temp 98.1 F (36.7 C)   Resp 18   Wt 47.2 kg   SpO2 95%   BMI 17.86 kg/m  SpO2: SpO2: 95 % O2 Device: O2 Device: Room  Air O2 Flow Rate:    Intake/output summary:  Intake/Output Summary (Last 24 hours) at 05/09/2024 1036 Last data filed at 05/08/2024 1800 Gross per 24 hour  Intake 199.38 ml  Output --  Net 199.38 ml   LBM: Last BM Date : 05/07/24 Baseline Weight: Weight: 47.2 kg Most recent weight: Weight: 47.2 kg       Palliative Assessment/Data:      Patient Active Problem List   Diagnosis Date Noted   Protein-calorie malnutrition, severe 05/06/2024   Constipation 05/04/2024   Heme positive stool 05/04/2024   Abdominal cramping 05/04/2024   Hypertension 05/03/2024   Acute GI bleeding 05/03/2024   Acute diverticulitis 12/12/2023   Diverticulitis 12/12/2023   Vascular dementia with delusions (HCC) 09/18/2021   Severe manic bipolar 1 disorder with psychotic behavior (HCC) 09/09/2021   Schizophrenia, paranoid type (HCC) 09/08/2021   History of alcohol use disorder 09/08/2021   Mixed Alzheimer's and vascular dementia (HCC) 09/08/2021   Severe alcohol use disorder, in sustained remission (HCC) 09/03/2019   Pneumothorax, right 02/01/2018   Recurrent spontaneous pneumothorax 01/31/2018   Pneumothorax on right 01/22/2018   Pneumothorax 01/15/2018    Palliative Care Assessment & Plan     Assessment:  Perianal fistula medical management for now no surgical intervention recommended, bilateral groin wounds/wound care consulted, schizophrenia and delusional disorder, patient lacks capacity, dementia, severe protein calorie  malnutrition.  Meeting scheduled for today with treatment team and family.  Unfortunately family were not available.   Detailed collaborative meeting today with psychiatry and transition of care.   I was able to speak with sister Talmadge Cassis by phone.  Continued education regarding complexity of current medical situation compounded by current psychosocial issues.    Family continue to verbalize their desire for all available and offered medical interventions to prolong life.  Family goal is to pursue guardianship.  Recommendations/Plan: Full medical support to treat treatable. Continue psychiatry recommendations Social work to assist with guardianship and disposition  Goals of Care and Additional Recommendations: Limitations on Scope of Treatment: Full Scope Treatment  Code Status:    Code Status Orders  (From admission, onward)           Start     Ordered   05/03/24 1514  Full code  Continuous       Question:  By:  Answer:  Consent: discussion documented in EHR   05/03/24 1517           Code Status History     Date Active Date Inactive Code Status Order ID Comments User Context   12/12/2023 0743 12/17/2023 1906 Full Code [id number]  Georgina Basket, MD ED   09/09/2021 1721 09/29/2021 2136 Full Code [id number]  Cam Charlie Loving, DO Inpatient   09/09/2021 1721 09/09/2021 1721 Full Code [id number]  Cam Charlie Loving, DO Inpatient   09/05/2021 1810 09/09/2021 1715 Full Code [id number]  Primus Larraine SAILOR, PA-C ED   08/14/2019 1949 08/18/2019 0126 Full Code [id number]  Darra Fonda MATSU, MD ED   02/01/2018 1632 02/06/2018 1510 Full Code [id number]  Dwan Kyla HERO, PA-C Inpatient   01/22/2018 0825 01/29/2018 1719 Full Code [id number]  Fleeta Hanford Coy, MD ED   01/15/2018 1101 01/17/2018 1509 Full Code [id number]  Lucien Orren SAILOR, PA-C ED       Prognosis:  Unable to determine  Discharge Planning: To Be Determined  Care plan was discussed with Dr. Larina,  and    [facility] and bedside  RN   60 minutes  Thank  you for allowing the Palliative Medicine Team to assist in the care of this patient.   Ronal Plants, NP  Please contact Palliative Medicine Team phone at [phone] for questions and concerns.

## 2024-05-09 NOTE — Plan of Care (Signed)

## 2024-05-09 NOTE — Consult Note (Signed)
 WOC Nurse Consult Note: Reason for Consult:  labial skin breaks, groin and buttock  Wound type: MASD, unknown why patient has linear skin breaks along groin and labia majora, perineal sloughing Measurement: wide area of involvement Wound bed: shallow, denuded  Drainage serous Periwound: unable to determine if patient has erythema present due to darkly pigmented skin tone Dressing procedure/placement/frequency: BID Cleanse with soap and water, pat dry well with gauze, apply thin layer of Gerhardt's butt cream to labia, groin, perineal, buttock areas. To coat all denuded skin sites and provide buffer to dual incontinence.    Staff to reposition patient every 2 hours or less, off-load heels at all times onto pillows.   05/09/24 labia, groin left   WOC will not follow and will remove patient from census task list.  Please reconsult if wound worsens in condition and notify provider.   Sherrilyn Hals MSN RN CWOCN WOC [facility]  [phone] (Available from 7-3 pm Mon-Friday)

## 2024-05-09 NOTE — TOC Progression Note (Signed)
 Transition of Care [facility]) - Progression Note    Patient Details  Name: Carolyn Huynh MRN: [id number] Date of Birth: 1955/08/11  [facility] CM/SW Contact  Sherline Clack, [place] Phone Number: 05/09/2024, 12:24 PM  Clinical Narrative:     Patient's daughter, [name], was unable to be present at the family meeting with providers today. However, [name] plans to come to the hospital today (11/25) or tomorrow (11/26) to speak with CSW. CSW will reach out to providers if they are available. CSW and Hospice NP called oldest daughter, [name], to talk about next steps for patient. [name] continues to want to pursue guardianship and states her sisters, [name] and [name], will help with the paperwork and taking care of the patient. CSW reviewed drafted capacity letter with [name] over the phone and will send signed letter to her over email ([email or web address]). CSW provided [name] with work phone number to reach out with questions and to pass along to [place].   Expected Discharge Plan: Skilled Nursing Facility Barriers to Discharge:  (Inpatient Pending Psych Consult)               Expected Discharge Plan and Services                                               Social Drivers of Health (SDOH) Interventions SDOH Screenings   Food Insecurity: No Food Insecurity (05/03/2024)  Housing: Low Risk  (05/03/2024)  Transportation Needs: Unmet Transportation Needs (05/03/2024)  Utilities: Not At Risk (05/03/2024)  Alcohol Screen: Low Risk  (09/09/2021)  Social Connections: Unknown (05/03/2024)  Tobacco Use: Low Risk  (12/11/2023)    Readmission Risk Interventions    12/15/2023    4:42 PM  Readmission Risk Prevention Plan  Post Dischage Appt Complete  Medication Screening Complete  Transportation Screening Complete

## 2024-05-10 LAB — COMPREHENSIVE METABOLIC PANEL WITH GFR
ALT: 8 U/L (ref 0–44)
AST: 12 U/L — ABNORMAL LOW (ref 15–41)
Albumin: 2 g/dL — ABNORMAL LOW (ref 3.5–5.0)
Alkaline Phosphatase: 48 U/L (ref 38–126)
Anion gap: 8 (ref 5–15)
BUN: 10 mg/dL (ref 8–23)
CO2: 26 mmol/L (ref 22–32)
Calcium: 8.3 mg/dL — ABNORMAL LOW (ref 8.9–10.3)
Chloride: 102 mmol/L (ref 98–111)
Creatinine, Ser: 1.14 mg/dL — ABNORMAL HIGH (ref 0.44–1.00)
GFR, Estimated: 52 mL/min — ABNORMAL LOW (ref >60)
Glucose, Bld: 109 mg/dL — ABNORMAL HIGH (ref 70–99)
Potassium: 3.7 mmol/L (ref 3.5–5.1)
Sodium: 136 mmol/L (ref 135–145)
Total Bilirubin: 0.4 mg/dL (ref 0.0–1.2)
Total Protein: 7.2 g/dL (ref 6.5–8.1)

## 2024-05-10 LAB — CBC WITH DIFFERENTIAL/PLATELET
Abs Immature Granulocytes: 0.03 K/uL (ref 0.00–0.07)
Basophils Absolute: 0.1 K/uL (ref 0.0–0.1)
Basophils Relative: 1 %
Eosinophils Absolute: 0.5 K/uL (ref 0.0–0.5)
Eosinophils Relative: 7 %
HCT: 34.6 % — ABNORMAL LOW (ref 36.0–46.0)
Hemoglobin: 11.1 g/dL — ABNORMAL LOW (ref 12.0–15.0)
Immature Granulocytes: 0 %
Lymphocytes Relative: 16 %
Lymphs Abs: 1.1 K/uL (ref 0.7–4.0)
MCH: 28.7 pg (ref 26.0–34.0)
MCHC: 32.1 g/dL (ref 30.0–36.0)
MCV: 89.4 fL (ref 80.0–100.0)
Monocytes Absolute: 0.7 K/uL (ref 0.1–1.0)
Monocytes Relative: 10 %
Neutro Abs: 4.7 K/uL (ref 1.7–7.7)
Neutrophils Relative %: 66 %
Platelets: 380 K/uL (ref 150–400)
RBC: 3.87 MIL/uL (ref 3.87–5.11)
RDW: 13.6 % (ref 11.5–15.5)
WBC: 7.1 K/uL (ref 4.0–10.5)
nRBC: 0 % (ref 0.0–0.2)

## 2024-05-10 NOTE — Plan of Care (Signed)

## 2024-05-10 NOTE — Hospital Course (Addendum)
 Brief Narrative:   68 year old female with medical history significant for hypertension, COPD, dementia, bipolar disorder, longstanding schizophrenia, delusional disorder, alcohol use disorder, behavioral health hospitalization in the past,, recurrent pneumothoraces, alcohol use disorder, diverticulosis, presented to the ED on 05/03/2024 with complaints of weakness, poor oral intake and blood in stools. [facility] evaluated, no evidence of active GI bleeding, CT A/P showed fecal retention and low-grade bleeding was felt to be due to that and stercoral ulceration versus unclear etiology. She declined bowel prep, colonoscopy or flexible sigmoidoscopy and GI signed off 11/21. Completed treatment for perianal fistula/buttock abscess. Psychiatry continues to closely follow and manage multiple behavioral health issues.   Assessment & Plan:     Constipation/obstipation Fecal impaction/stercoral colitis Low-volume rectal bleeding [facility] was consulted, patient declined bowel prep, colonoscopy or flex sigmoidoscopy.  GI signed off 11/21.  Continue to monitor as she appears to be having bowel movements.   Perianal fistula Buttock abscess Per GYN, no surgical intervention.  She has completed course of Zosyn  and now on Augmentin  EOT 12/28.  Local wound care.  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 Seen by psychiatry on Abilify  5 mg daily. Psychiatry, family and TOC working on safe disposition   SVT Earlier on in hospital stay.  Resolved   2.9 cm renal cyst Noted on CT A/P.  Will need outpatient follow-up and evaluation and management as deemed necessary.   Anemia Stable.   Nutrition Status: Nutrition Problem: Severe Malnutrition Etiology: social / environmental circumstances Signs/Symptoms: energy intake < 75% for > or equal to 3 months, severe muscle  depletion, severe fat depletion Interventions: Ensure Enlive (each supplement provides 350kcal and 20 grams of protein), MVI Management as per registered dietitian input.   Body mass index is 19.55 kg/m.     DVT prophylaxis: enoxaparin  (LOVENOX ) injection 40 mg Start: 05/13/24 1100 SCDs Start: 05/13/24 1011     Code Status: Full Code:  Family Communication: None at bedside. Disposition:  Status is: Inpatient Remains inpatient appropriate because: Ongoing psychiatric follow-up with medication adjustments and awaiting safe disposition decision.   Discussed in detail with TOC on 12/11.  They last communicated with family/daughters November and at which time daughters were agreeable to taking patient back home.  Requested TOC to reach back out to family to see if that still is the plan.  Also there were overall concerns that if patient returns home, she will continue to refuse medications, care, MD visits like she has in the past and regress again.  Barrier to SNF placement currently is inadequate insurance.  Difficult situation.  Discussed with TOC, they left a message for patient's family yesterday but have not heard back.   PT Follow up Recs:   Subjective: No complaints doing okay   Examination:  General exam: Appears calm and comfortable  Respiratory system: Clear to auscultation. Respiratory effort normal. Cardiovascular system: S1 & S2 heard, RRR. No JVD, murmurs, rubs, gallops or clicks. No pedal edema. Gastrointestinal system: Abdomen is nondistended, soft and nontender. No organomegaly or masses felt. Normal bowel sounds heard. Central nervous system: Alert and oriented. No focal neurological deficits. Extremities: Symmetric 5 x 5 power. Skin: No rashes, lesions or ulcers Psychiatry: Judgement and insight appear poor

## 2024-05-10 NOTE — Consult Note (Addendum)
 [facility] Follow-up  Patient Name: .Carolyn Huynh  MRN: [id number]  DOB: February 04, 1956  Consult Order details:  Orders (From admission, onward)     Start     Ordered   05/05/24 1340  IP CONSULT TO PSYCHIATRY       Ordering Provider: Samtani, Jai-Gurmukh, MD  Provider:  (Not yet assigned)  Question Answer Comment  Location [facility]   Reason for Consult? shcizooid--non complait on meds---needs to be seen--read my note pls      05/05/24 1341             Mode of Visit: In person    Psychiatry Consult Evaluation  Service Date: May 10, 2024 LOS:  LOS: 2 days  Chief Complaint the patient is a 68 year old female with a past history of schizoaffective disorder/schizophrenia and a history of mild cognitive impairment referred for medication management.  Primary Psychiatric Diagnoses  Schizophrenia, paranoid type 2.  Delusional disorder 3.  Vascular dementia, moderate  Assessment  Carolyn Huynh is a 68 y.o. female admitted: Medicallyfor 05/03/2024  4:48 AM for increasing weakness and watery stools.. She carries the psychiatric diagnoses of delusional disorder/schizophrenia and has a past medical history of vascular dementia.   The patient's current presentation with delusions, thinking that she is adopted and that she was born in [place] and she was fluent in Chinese is consistent with schizophrenia and delusional disorder.  Patient lacks insight and some of her delusions are quite fixed and have been consistent for many years.  Antipsychotics appear to help her when she is in the hospital but she has been noncompliant. Given her noncompliance we will consider starting her on Invega  and if she improves on this medication with no side effects we will consider Invega  Sustenna for long-term relief. Given her vascular dementia, antipsychotics will be used with caution.  05/09/2024: A family meeting was scheduled with the LCSW, palliative care  the patient's children and this clinical research associate.  However the patient's children did not show up for the meeting.  Interviewed the patient and discussed the case with palliative care, Ronal Plants, NP and the social lenetta Clack, [place], [place].  All agree that the patient has multiple comorbidities along with her chronic psychiatric disease with fixed delusions.  She continues to lack capacity but has been refusing interventions and using poor judgment.  Ideally it would be better for the family to obtain guardianship but that might be a long process.  In the meantime, we will continue to treat with antipsychotic and transition to a long-acting injectable when appropriate.  Although this may help with some of her symptoms, it is unlikely that she will be significantly improved to make her own decisions.  05/10/2024: No significant change although more cooperative.  Will consider giving her long-acting injectable Invega  if the pharmacist can provide the low dose of 39 mg. (Tried to place the order but based on her creatinine clearance contraindication is showing up for long-acting injectable.) Full recommendations below.  Diagnoses:  Active Hospital problems: Principal Problem:   Acute GI bleeding Active Problems:   Recurrent spontaneous pneumothorax   Schizophrenia, paranoid type (HCC)   Severe manic bipolar 1 disorder with psychotic behavior (HCC)   Vascular dementia with delusions (HCC)   Hypertension   Severe alcohol use disorder, in sustained remission (HCC)   Constipation   Heme positive stool   Abdominal cramping   Protein-calorie malnutrition, severe    Plan   ## Psychiatric Medication Recommendations:  Continue  with the Invega  1.5 mg a day. Invega  Sustenna 39 mg IM given once her creatinine clearance improves.  Then repeat in 1 week and subsequently q. 39 mg IM every 28 days. ## Medical Decision Making Capacity: Not specifically addressed in this encounter Patient does  not have capacity to refuse medical care at this time.  ## Further Work-up:  -- Per the hospitalist EKG -- most recent EKG on 05/05/2024 had QtCb of 452 -- Pertinent labwork reviewed earlier this admission includes: As per the hospitalist - Recommend neurology follow-up for vascular dementia and possibly starting on medications if indicated.  ## Disposition:-- Plan Post Discharge/Psychiatric Care Follow-up resources if patient ends up on long-acting injectable, she will need routine follow-up with psychiatry.  Recommend TOCfollow-up prior to discharge  ## Behavioral / Environmental: -Delirium Precautions: Delirium Interventions for Nursing and Staff: - RN to open blinds every AM. - To Bedside: Glasses, hearing aide, and pt's own shoes. Make available to patients. when possible and encourage use. - Encourage po fluids when appropriate, keep fluids within reach. - OOB to chair with meals. - Passive ROM exercises to all extremities with AM & PM care. - RN to assess orientation to person, time and place QAM and PRN. - Recommend extended visitation hours with familiar family/friends as feasible. - Staff to minimize disturbances at night. Turn off television when pt asleep or when not in use. or Utilize compassion and acknowledge the patient's experiences while setting clear and realistic expectations for care.    ## Safety and Observation Level:  - Based on my clinical evaluation, I estimate the patient to be at low risk of self harm in the current setting. - At this time, we recommend  routine. This decision is based on my review of the chart including patient's history and current presentation, interview of the patient, mental status examination, and consideration of suicide risk including evaluating suicidal ideation, plan, intent, suicidal or self-harm behaviors, risk factors, and protective factors. This judgment is based on our ability to directly address suicide risk, implement suicide prevention  strategies, and develop a safety plan while the patient is in the clinical setting. Please contact our team if there is a concern that risk level has changed.  CSSR Risk Category:C-SSRS RISK CATEGORY: No Risk  Suicide Risk Assessment: Patient has following modifiable risk factors for suicide: medication noncompliance and triggering events, which we are addressing by outpatient follow-up appointment and medication management. Patient has following non-modifiable or demographic risk factors for suicide: psychiatric hospitalization Patient has the following protective factors against suicide: Access to outpatient mental health care and Supportive family  Thank you for this consult request. Recommendations have been communicated to the primary team.  We will continue to follow at this time.   [name], MD       History of Present Illness  Relevant Aspects of [facility] Course:  Admitted on 05/03/2024 for weakness and nausea and vomiting. They the following her for her medical condition including pneumothorax, diverticulosis..   Patient Report:  The patient is a 69 year old female who lives with her daughter in [place]  but is originally from [place] .  Collateral from the family indicates that the patient has a long history of alcoholism and psychosis they remember event when she was young, drinking beer and talking to herself.  She has always been delusional and tends to talk to herself all the time.  She had a few hospitalizations for psychosis in [place]  but was mainly hospitalized for her alcoholism,  lung disease and other medical conditions.  She moved to [place]  to stay with one of her daughters.  Patient has 3 daughters.  Apparently she has been hospitalized twice in 2021 and 23 for psychosis but has been noncompliant with medications.  Her last hospitalization 2023 to [facility] and she was placed on Zyprexa  which she promptly stopped after  discharge. 05/06/2024: The patient was seen and evaluated.  She is lying in bed and appears fairly emaciated and dehydrated.  She is oriented to place and person only.  She maintained fair to good eye contact.  Her speech was low volume without any looseness of associations or flight of ideas.  She reports she lives with her daughter in [place]  for the last 5 to 6 years.  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  When asked if you can speak Chinese she started speaking some gibberish that sounded like Chinese.  Although she did not get agitated she continued to ramble but in general was calm during this interview.  She is not a great historian and was unable to give any details but gave me permission to talk to her daughters.  She denied any active SI/HI/AVH.  She has minimal insight and judgment and at the present time suspect that she lacks capacity to make informed decisions.  Will continue to evaluate her status after initiating a low-dose antipsychotic. 05/07/2024: The patient was seen and reevaluated today.  She is alert oriented and cooperative and in general is fairly pleasant without any agitation or behavioral disturbance.  However she remains delusional and still believes that she is a physician and that she was born and raised in [place].  Other than these fixed delusions patient denies any auditory visual hallucinations.  She denies suicidal ideations.  She has minimal insight and reports that she would prefer to live on her own and stay with her daughter. 05/08/2024: The patient was seen and reevaluated today.  She is alert oriented and cooperative and in general is fairly pleasant without any agitation or behavioral disturbance. Patient remains delusional stating that she is a doctor. She does not have any insight into why she is in the hospital but she has been compliant with treatment and has not required any medications for  agitation. The patient states that she does not need any medications for mental health and denies any SI/HI/AVH or concerns that the treatment team is trying to hurt her.  05/09/2024: The patient is seen and evaluated today.  In addition discussed with the team.  The patient is oriented and in general cooperative and fairly pleasant until discussing her treatment plan.  She then becomes very aggressive verbally and claims that she is a clinical research associate and a doctor and that she was born in [place] and started showing some agitation.  She shows very poor insight and judgment.  However she clearly denies any active SI/HI/AVH. 05/10/2024: The patient was seen and reevaluated.  In general the patient is oriented and cooperative and fairly pleasant.  She is not as verbally aggressive today.  She reports that none of her children visited her.  And examined she is alert oriented and cooperative.  She reports no SI/HI/AVH.  However she remains with fixed delusions and claims that she is a doctor and a lawyer and that next time I come there she is going to show me her diploma.  Other than some of the fixed beliefs,  patient appears fairly cooperative. Psych ROS:  Depression: Denied Anxiety: Denied Mania (lifetime and current): Unknown Psychosis: (lifetime and current): Lifelong history of psychosis and delusions  Collateral information:  Contacted patient's 2 daughters Estefana and [name] at [phone] on 05/06/2024 Both the daughters are good historians and report that they have been taking care of the mother and that she has always been delusional and paranoid which seems to exacerbate when she is drinking.  They have been also trying to get guardianship but so far they do not have guardianship.  Patient lives with one of her daughters. Her daughters are very supportive and are willing to be involved in discharge planning.  Daughter [name] Cassis [phone] 05/08/2024 She reports that she has attempted to get  guardianship over her mother in the past however she does not have the money to pay for a lawyer. She is asking for assistance with this process because she does not think that her mother will stay compliant with her treatment when she leaves the hospital and in the past she has not been forthcoming with her discharge paperwork making it more difficult for the family to help her with treatment.    Review of Systems  Psychiatric/Behavioral:  Positive for hallucinations. The patient is nervous/anxious.      Psychiatric and Social History  Psychiatric History:  Information collected from medical records and family.  Prev Dx/Sx: Schizophrenia, delusional disorder, vascular dementia Current Psych Provider: Last hospitalized at [facility]. Home Meds (current): Zyprexa , noncompliant Previous Med Trials: Unknown Therapy: Unknown  Prior Psych Hospitalization: At least 2 hospitalizations in [place]  in 2021 at [facility] in 2023 at [facility] Prior Self Harm: None Prior Violence: None  Family Psych History: Unknown Family Hx suicide: Unknown  Social History:  The patient is single, currently living with her daughter in [place] .  Prior to that she was living in [place]  with her other daughter. Access to weapons/lethal means: None  Substance History Extensive history of alcoholism.  Exam Findings  Physical Exam: Per the hospitalist Vital Signs:  Temp:  [97.7 F (36.5 C)-98.6 F (37 C)] 98.4 F (36.9 C) (11/26 0413) Pulse Rate:  [81-87] 85 (11/26 0413) Resp:  [17-18] 17 (11/26 0413) BP: (114-133)/(67-76) 133/76 (11/26 0413) SpO2:  [91 %-96 %] 94 % (11/26 0413) Blood pressure 133/76, pulse 85, temperature 98.4 F (36.9 C), resp. rate 17, weight 47.2 kg, SpO2 94%. Body mass index is 17.86 kg/m.  Physical Exam Constitutional:      Appearance: Normal appearance. She is ill-appearing.  Neurological:     Mental Status: She is alert. Mental status is at baseline.     Mental  Status Exam: General Appearance: Casual and Disheveled  Orientation:  Other:  Oriented to place and person only  Memory:  Immediate;   Fair Recent;   Poor Remote;   Poor  Concentration:  Concentration: Fair and Attention Span: Fair  Recall:  Poor  Attention  Poor  Eye Contact:  Fair  Speech:  Slow  Language:  Fair  Volume:  Decreased  Mood: Mildly labile  Affect:  Constricted and Restricted  Thought Process:  Disorganized  Thought Content:  Delusions and Paranoid Ideation  Suicidal Thoughts:  No  Homicidal Thoughts:  No  Judgement:  Impaired  Insight:  Lacking  Psychomotor Activity:  Decreased  Akathisia:  No  Fund of Knowledge:  Poor      Assets:  Communication Skills Housing Social Support  Cognition:  Impaired,  Moderate  ADL's:  Impaired  AIMS (  if indicated):        Other History   These have been pulled in through the EMR, reviewed, and updated if appropriate.  Family History:  The patient's family history is not on file.  Medical History: Past Medical History:  Diagnosis Date   Asthma    COPD (chronic obstructive pulmonary disease) (HCC)    Dementia (HCC)    Hypertension    Pneumothorax     Surgical History: Past Surgical History:  Procedure Laterality Date   CHEST TUBE INSERTION     VIDEO ASSISTED THORACOSCOPY Right 02/01/2018   Procedure: VIDEO ASSISTED THORACOSCOPY, BLEB STAPLING;  Surgeon: Fleeta Hanford Coy, MD;  Location: [facility] OR;  Service: Thoracic;  Laterality: Right;     Medications:   Current Facility-Administered Medications:    acetaminophen  (TYLENOL ) tablet 650 mg, 650 mg, Oral, Q6H PRN, 650 mg at 05/09/24 0634 **OR** acetaminophen  (TYLENOL ) suppository 650 mg, 650 mg, Rectal, Q6H PRN, Seena Marsa NOVAK, MD   Gerhardt's butt cream, , Topical, BID, Samtani, Jai-Gurmukh, MD, Given at 05/10/24 0828   multivitamin with minerals tablet 1 tablet, 1 tablet, Oral, Daily, Samtani, Jai-Gurmukh, MD, 1 tablet at 05/10/24 9171   paliperidone  (INVEGA )  24 hr tablet 1.5 mg, 1.5 mg, Oral, Daily, Kerrie Timm, MD, 1.5 mg at 05/10/24 9171   piperacillin -tazobactam (ZOSYN ) IVPB 3.375 g, 3.375 g, Intravenous, Q8H, Samtani, Jai-Gurmukh, MD, Last Rate: 12.5 mL/hr at 05/10/24 0543, 3.375 g at 05/10/24 0543   sodium chloride  flush (NS) 0.9 % injection 3 mL, 3 mL, Intravenous, Q12H, Melvin, Alexander B, MD, 3 mL at 05/10/24 9171  Allergies: Allergies  Allergen Reactions   Aspirin Shortness Of Breath   Shellfish Allergy Anaphylaxis    [name], MD

## 2024-05-10 NOTE — Progress Notes (Addendum)
 PROGRESS NOTE    Carolyn Huynh  FMW:[id number] DOB: 07-27-1955 DOA: 05/03/2024 PCP: Patient, [name]  Subjective: No acute events overnight. Seen and examined at bedside. No new complaints. Reports feeling better. Reports discharge has decreased but hasn't been able to check her back herself today. Tolerating oral intake without n/v. Denies constipation.   Hospital Course:  68 year old female with longstanding schizophrenia, delusional disorder, EtOH abuse with psychiatric hospitalization in 2023, pneumothorax with surgery to left chest in 2000 complicated by spontaneous pneumothoraces managed by Dr. Vantrigt-most recent admission for this 2019 undergoing a right VATS with stapling of blebs and mechanical pleurodesis. Brought to ED from home with daughter increasing weakness red water stools. Evaluation in the ED no obvious hemorrhoids had bright red blood per rectum. FOBT was positive    Assessment and Plan:  Perianal fistula Buttock abscess Continue on zosyn  Plan to transition to augmentin to finish 7 day course Continue with local wound care as per wound care team recommendations  Stercoral colitis Resolved with bowel regimen Monitor for regular BMs  Renal cyst Noted on CT abdomen/pelvis to be 2.9cm Will need outpatient workup  Schizophrenia Patient lacks capacity for medication decision making Continue Invega  Psych following Will need to follow up with psychiatry  Moderate Alzheimer's dementia Monitor clinically  Bright red blood per rectum Repeat FOBT 11/20 negative GI signed off as patient refused colonoscopy and will not drink preparation Monitor clinically  DVT prophylaxis: SCDs Start: 05/03/24 1514  SCDs   Code Status: Full Code Disposition Plan: SNF Reason for continuing need for hospitalization: IV antibiotics, monitor perianal fistula with discharge  Objective: Vitals:   05/10/24 0127 05/10/24 0413 05/10/24 1149 05/10/24 1615  BP: 118/67 133/76  110/67 117/80  Pulse: 87 85 81 78  Resp: 17 17    Temp: 98.6 F (37 C) 98.4 F (36.9 C) (!) 97.4 F (36.3 C) (!) 97.4 F (36.3 C)  TempSrc:   Oral Oral  SpO2: 91% 94% 96% 98%  Weight:       No intake or output data in the 24 hours ending 05/10/24 1655 Filed Weights   05/05/24 1522  Weight: 47.2 kg    Examination:  Physical Exam Vitals and nursing note reviewed.  Constitutional:      General: She is not in acute distress.    Appearance: She is ill-appearing.  HENT:     Head: Normocephalic and atraumatic.  Cardiovascular:     Rate and Rhythm: Normal rate and regular rhythm.     Pulses: Normal pulses.     Heart sounds: Normal heart sounds.  Pulmonary:     Effort: Pulmonary effort is normal.     Breath sounds: Normal breath sounds.  Abdominal:     General: Bowel sounds are normal.     Palpations: Abdomen is soft.  Neurological:     Mental Status: She is alert.     Data Reviewed: I have personally reviewed following labs and imaging studies  CBC: Recent Labs  Lab 05/05/24 0526 05/05/24 1442 05/08/24 0536 05/09/24 0443 05/10/24 0327  WBC 10.7* 10.1 7.2 6.4 7.1  NEUTROABS 8.7* 8.5* 4.8 4.0 4.7  HGB 12.7 13.2 11.9* 11.7* 11.1*  HCT 38.8 40.5 36.6 36.6 34.6*  MCV 89.4 89.6 90.1 90.6 89.4  PLT 294 291 332 362 380   Basic Metabolic Panel: Recent Labs  Lab 05/03/24 2324 05/04/24 0530 05/05/24 1442 05/07/24 1900 05/08/24 0536 05/09/24 0443 05/10/24 0327  NA 138   < > 136 139 137  137 136  K 3.6   < > 3.4* 3.8 4.0 3.9 3.7  CL 102   < > 99 99 101 100 102  CO2 24   < > 25 27 27 28 26   GLUCOSE 120*   < > 160* 104* 104* 141* 109*  BUN 12   < > 17 9 9 9 10   CREATININE 0.94   < > 0.83 0.84 1.01* 1.02* 1.14*  CALCIUM 8.8*   < > 8.6* 8.4* 8.3* 8.5* 8.3*  MG 2.2  --   --   --   --   --   --    < > = values in this interval not displayed.   GFR: Estimated Creatinine Clearance: 34.2 mL/min (A) (by C-G formula based on SCr of 1.14 mg/dL (H)). Liver Function  Tests: Recent Labs  Lab 05/04/24 0530 05/08/24 0536 05/09/24 0443 05/10/24 0327  AST 14* 16 15 12*  ALT 9 8 9 8   ALKPHOS 63 55 54 48  BILITOT 1.0 0.3 0.4 0.4  PROT 8.1 7.6 7.4 7.2  ALBUMIN 2.4* 2.0* 2.0* 2.0*   No results for input(s): LIPASE, AMYLASE in the last 168 hours. No results for input(s): AMMONIA in the last 168 hours. Coagulation Profile: No results for input(s): INR, PROTIME in the last 168 hours. Cardiac Enzymes: No results for input(s): CKTOTAL, CKMB, CKMBINDEX, TROPONINI in the last 168 hours. ProBNP, BNP (last 5 results) No results for input(s): PROBNP, BNP in the last 8760 hours. HbA1C: No results for input(s): HGBA1C in the last 72 hours. CBG: No results for input(s): GLUCAP in the last 168 hours. Lipid Profile: No results for input(s): CHOL, HDL, LDLCALC, TRIG, CHOLHDL, LDLDIRECT in the last 72 hours. Thyroid Function Tests: No results for input(s): TSH, T4TOTAL, FREET4, T3FREE, THYROIDAB in the last 72 hours. Anemia Panel: No results for input(s): VITAMINB12, FOLATE, FERRITIN, TIBC, IRON, RETICCTPCT in the last 72 hours. Sepsis Labs: No results for input(s): PROCALCITON, LATICACIDVEN in the last 168 hours.  Recent Results (from the past 240 hours)  Urine Culture (for pregnant, neutropenic or urologic patients or patients with an indwelling urinary catheter)     Status: Abnormal   Collection Time: 05/03/24 12:33 PM   Specimen: Urine, Clean Catch  Result Value Ref Range Status   Specimen Description URINE, CLEAN CATCH  Final   Special Requests   Final    NONE Performed at [facility], [address] [place], [place] [address]    Culture MULTIPLE SPECIES PRESENT, SUGGEST RECOLLECTION (A)  Final   Report Status 05/06/2024 FINAL  Final     Radiology Studies: No results found.  Scheduled Meds:  Gerhardt's butt cream   Topical BID   multivitamin with minerals  1 tablet Oral Daily    paliperidone   1.5 mg Oral Daily   sodium chloride  flush  3 mL Intravenous Q12H   Continuous Infusions:  piperacillin -tazobactam (ZOSYN )  IV 3.375 g (05/10/24 1347)     LOS: 2 days   Norval Bar, MD  [facility]  05/10/2024, 4:55 PM

## 2024-05-11 ENCOUNTER — Encounter (HOSPITAL_COMMUNITY): Payer: Self-pay | Admitting: Internal Medicine

## 2024-05-11 DIAGNOSIS — J9383 Other pneumothorax: Secondary | ICD-10-CM | POA: Diagnosis not present

## 2024-05-11 DIAGNOSIS — K922 Gastrointestinal hemorrhage, unspecified: Secondary | ICD-10-CM | POA: Diagnosis not present

## 2024-05-11 DIAGNOSIS — F01B0 Vascular dementia, moderate, without behavioral disturbance, psychotic disturbance, mood disturbance, and anxiety: Secondary | ICD-10-CM | POA: Diagnosis not present

## 2024-05-11 DIAGNOSIS — F200 Paranoid schizophrenia: Secondary | ICD-10-CM | POA: Diagnosis not present

## 2024-05-11 MED ORDER — ARIPIPRAZOLE 2 MG PO TABS
2.0000 mg | ORAL_TABLET | Freq: Every day | ORAL | Status: DC
  Administered 2024-05-11 – 2024-05-19 (×9): 2 mg via ORAL
  Filled 2024-05-11 (×11): qty 1

## 2024-05-11 NOTE — Consult Note (Signed)
 [facility] Consult Follow-up  Patient Name: .Carolyn Huynh  MRN: [id number]  DOB: [birth year]  Consult Order details:  Orders (From admission, onward)     Start     Ordered   05/05/24 1340  IP CONSULT TO PSYCHIATRY       Ordering Provider: Samtani, Jai-Gurmukh, MD  Provider:  (Not yet assigned)  Question Answer Comment  Location [facility]   Reason for Consult? shcizooid--non complait on meds---needs to be seen--read my note pls      05/05/24 1341             Mode of Visit: In person    Psychiatry Consult Evaluation  Service Date: May 11, 2024 LOS:  LOS: 3 days  Chief Complaint the patient is a 68 year old female with a past history of schizoaffective disorder/schizophrenia and a history of mild cognitive impairment referred for medication management.  Primary Psychiatric Diagnoses  Schizophrenia, paranoid type 2.  Delusional disorder 3.  Vascular dementia, moderate  Assessment  Carolyn Huynh is a 68 y.o. female admitted: Medicallyfor 05/03/2024  4:48 AM for increasing weakness and watery stools.. She carries the psychiatric diagnoses of delusional disorder/schizophrenia and has a past medical history of vascular dementia.   The patient's current presentation with delusions, thinking that she is adopted and that she was born in [place] and she was fluent in Chinese is consistent with schizophrenia and delusional disorder.  Patient lacks insight and some of her delusions are quite fixed and have been consistent for many years.  Antipsychotics appear to help her when she is in the hospital but she has been noncompliant. Given her noncompliance we will consider starting her on Invega  and if she improves on this medication with no side effects we will consider Invega  Sustenna for long-term relief. Given her vascular dementia, antipsychotics will be used with caution.  05/09/2024: A family meeting was scheduled with the LCSW, palliative care  the patient's children and this clinical research associate.  However the patient's children did not show up for the meeting.  Interviewed the patient and discussed the case with palliative care, Ronal Plants, NP and the social lenetta Clack, [place], [place].  All agree that the patient has multiple comorbidities along with her chronic psychiatric disease with fixed delusions.  She continues to lack capacity but has been refusing interventions and using poor judgment.  Ideally it would be better for the family to obtain guardianship but that might be a long process.  In the meantime, we will continue to treat with antipsychotic and transition to a long-acting injectable when appropriate.  Although this may help with some of her symptoms, it is unlikely that she will be significantly improved to make her own decisions.  05/10/2024: No significant change although more cooperative.  Will consider giving her long-acting injectable Invega  if the pharmacist can provide the low dose of 39 mg. (Tried to place the order but based on her creatinine clearance contraindication is showing up for long-acting injectable.) 05/11/2024: The patient was seen and reevaluated.  She is essentially unchanged and continues to have the same fixed delusions but is more pleasant and cooperative.  Although the Invega  1.5 mg appears to be effective, there is a relative contraindication given her creatinine clearance.  The plan is to change her to Abilify  2 mg a day.  If she tolerates this we can consider an long-acting injection of Abilify . Full recommendations below.  Diagnoses:  Active Hospital problems: Principal Problem:   Acute GI bleeding Active Problems:  Recurrent spontaneous pneumothorax   Schizophrenia, paranoid type (HCC)   Severe manic bipolar 1 disorder with psychotic behavior (HCC)   Vascular dementia with delusions (HCC)   Hypertension   Severe alcohol use disorder, in sustained remission (HCC)   Constipation   Heme  positive stool   Abdominal cramping   Protein-calorie malnutrition, severe    Plan   ## Psychiatric Medication Recommendations:  Discontinue Invega  1.5 mg a day Begin Abilify  2 mg a day ## Medical Decision Making Capacity: Not specifically addressed in this encounter Patient does not have capacity to refuse medical care at this time.  ## Further Work-up:  -- Per the hospitalist EKG -- most recent EKG on 05/05/2024 had QtCb of 452 -- Pertinent labwork reviewed earlier this admission includes: As per the hospitalist - Recommend neurology follow-up for vascular dementia and possibly starting on medications if indicated.  ## Disposition:-- Plan Post Discharge/Psychiatric Care Follow-up resources if patient ends up on long-acting injectable, she will need routine follow-up with psychiatry.  Recommend TOCfollow-up prior to discharge  ## Behavioral / Environmental: -Delirium Precautions: Delirium Interventions for Nursing and Staff: - RN to open blinds every AM. - To Bedside: Glasses, hearing aide, and pt's own shoes. Make available to patients. when possible and encourage use. - Encourage po fluids when appropriate, keep fluids within reach. - OOB to chair with meals. - Passive ROM exercises to all extremities with AM & PM care. - RN to assess orientation to person, time and place QAM and PRN. - Recommend extended visitation hours with familiar family/friends as feasible. - Staff to minimize disturbances at night. Turn off television when pt asleep or when not in use. or Utilize compassion and acknowledge the patient's experiences while setting clear and realistic expectations for care.    ## Safety and Observation Level:  - Based on my clinical evaluation, I estimate the patient to be at low risk of self harm in the current setting. - At this time, we recommend  routine. This decision is based on my review of the chart including patient's history and current presentation, interview of the  patient, mental status examination, and consideration of suicide risk including evaluating suicidal ideation, plan, intent, suicidal or self-harm behaviors, risk factors, and protective factors. This judgment is based on our ability to directly address suicide risk, implement suicide prevention strategies, and develop a safety plan while the patient is in the clinical setting. Please contact our team if there is a concern that risk level has changed.  CSSR Risk Category:C-SSRS RISK CATEGORY: No Risk  Suicide Risk Assessment: Patient has following modifiable risk factors for suicide: medication noncompliance and triggering events, which we are addressing by outpatient follow-up appointment and medication management. Patient has following non-modifiable or demographic risk factors for suicide: psychiatric hospitalization Patient has the following protective factors against suicide: Access to outpatient mental health care and Supportive family  Thank you for this consult request. Recommendations have been communicated to the primary team.  We will continue to follow at this time.   [name], MD       History of Present Illness  Relevant Aspects of [facility] Course:  Admitted on 05/03/2024 for weakness and nausea and vomiting. They the following her for her medical condition including pneumothorax, diverticulosis..   Patient Report:  The patient is a 68 year old female who lives with her daughter in [place]  but is originally from [place] .  Collateral from the family indicates that the patient has a long history of alcoholism  and psychosis they remember event when she was young, drinking beer and talking to herself.  She has always been delusional and tends to talk to herself all the time.  She had a few hospitalizations for psychosis in [place]  but was mainly hospitalized for her alcoholism, lung disease and other medical conditions.  She moved to [place]  to  stay with one of her daughters.  Patient has 3 daughters.  Apparently she has been hospitalized twice in 2021 and 23 for psychosis but has been noncompliant with medications.  Her last hospitalization 2023 to [facility] and she was placed on Zyprexa  which she promptly stopped after discharge. 05/06/2024: The patient was seen and evaluated.  She is lying in bed and appears fairly emaciated and dehydrated.  She is oriented to place and person only.  She maintained fair to good eye contact.  Her speech was low volume without any looseness of associations or flight of ideas.  She reports she lives with her daughter in [place]  for the last 5 to 6 years.  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  When asked if you can speak Chinese she started speaking some gibberish that sounded like Chinese.  Although she did not get agitated she continued to ramble but in general was calm during this interview.  She is not a great historian and was unable to give any details but gave me permission to talk to her daughters.  She denied any active SI/HI/AVH.  She has minimal insight and judgment and at the present time suspect that she lacks capacity to make informed decisions.  Will continue to evaluate her status after initiating a low-dose antipsychotic. 05/07/2024: The patient was seen and reevaluated today.  She is alert oriented and cooperative and in general is fairly pleasant without any agitation or behavioral disturbance.  However she remains delusional and still believes that she is a physician and that she was born and raised in [place].  Other than these fixed delusions patient denies any auditory visual hallucinations.  She denies suicidal ideations.  She has minimal insight and reports that she would prefer to live on her own and stay with her daughter. 05/08/2024: The patient was seen and reevaluated today.  She is alert oriented and cooperative and in  general is fairly pleasant without any agitation or behavioral disturbance. Patient remains delusional stating that she is a doctor. She does not have any insight into why she is in the hospital but she has been compliant with treatment and has not required any medications for agitation. The patient states that she does not need any medications for mental health and denies any SI/HI/AVH or concerns that the treatment team is trying to hurt her.  05/09/2024: The patient is seen and evaluated today.  In addition discussed with the team.  The patient is oriented and in general cooperative and fairly pleasant until discussing her treatment plan.  She then becomes very aggressive verbally and claims that she is a clinical research associate and a doctor and that she was born in [place] and started showing some agitation.  She shows very poor insight and judgment.  However she clearly denies any active SI/HI/AVH. 05/10/2024: The patient was seen and reevaluated.  In general the patient is oriented and cooperative and fairly pleasant.  She is not as verbally aggressive today.  She reports that none of her children visited her.  And examined she is alert oriented  and cooperative.  She reports no SI/HI/AVH.  However she remains with fixed delusions and claims that she is a doctor and a lawyer and that next time I come there she is going to show me her diploma.  Other than some of the fixed beliefs, patient appears fairly cooperative. 05/11/2024: The patient was seen and evaluated today.  She is laying in bed and appears to be in no acute distress.  Her speech is coherent without any obvious looseness of association or flight of ideas or tangentiality.  She still maintains the same delusions and continues to insist that she has a doctor and a lawyer but does not appear to be currently answering to herself either today.  Overall she denies active SI/HI/AVH and is redirectable.  Given her creatinine clearance will change from Invega  to  Abilify . Psych ROS:  Depression: Denied Anxiety: Denied Mania (lifetime and current): Unknown Psychosis: (lifetime and current): Lifelong history of psychosis and delusions  Collateral information:  Contacted patient's 2 daughters Estefana and [name] at [phone] on 05/06/2024 Both the daughters are good historians and report that they have been taking care of the mother and that she has always been delusional and paranoid which seems to exacerbate when she is drinking.  They have been also trying to get guardianship but so far they do not have guardianship.  Patient lives with one of her daughters. Her daughters are very supportive and are willing to be involved in discharge planning.  Daughter [name] Cassis [phone] 05/08/2024 She reports that she has attempted to get guardianship over her mother in the past however she does not have the money to pay for a lawyer. She is asking for assistance with this process because she does not think that her mother will stay compliant with her treatment when she leaves the hospital and in the past she has not been forthcoming with her discharge paperwork making it more difficult for the family to help her with treatment.    Review of Systems  Psychiatric/Behavioral:  Positive for hallucinations. The patient is nervous/anxious.      Psychiatric and Social History  Psychiatric History:  Information collected from medical records and family.  Prev Dx/Sx: Schizophrenia, delusional disorder, vascular dementia Current Psych Provider: Last hospitalized at [facility]. Home Meds (current): Zyprexa , noncompliant Previous Med Trials: Unknown Therapy: Unknown  Prior Psych Hospitalization: At least 2 hospitalizations in [place]  in 2021 at [facility] in 2023 at [facility] Prior Self Harm: None Prior Violence: None  Family Psych History: Unknown Family Hx suicide: Unknown  Social History:  The patient is single, currently living with her daughter in [place].  Prior to that she was living in [place]  with her other daughter. Access to weapons/lethal means: None  Substance History Extensive history of alcoholism.  Exam Findings  Physical Exam: Per the hospitalist Vital Signs:  Temp:  [97.4 F (36.3 C)-97.9 F (36.6 C)] 97.9 F (36.6 C) (11/27 0816) Pulse Rate:  [70-83] 70 (11/27 0816) Resp:  [17-18] 17 (11/27 0218) BP: (110-152)/(67-84) 134/82 (11/27 0816) SpO2:  [94 %-98 %] 97 % (11/27 0816) Blood pressure 134/82, pulse 70, temperature 97.9 F (36.6 C), resp. rate 17, weight 47.2 kg, SpO2 97%. Body mass index is 17.86 kg/m.  Physical Exam Constitutional:      Appearance: Normal appearance. She is ill-appearing.  Neurological:     Mental Status: She is alert. Mental status is at baseline.     Mental Status Exam: General Appearance: Casual and Disheveled  Orientation:  Other:  Oriented to place and person only  Memory:  Immediate;   Fair Recent;   Poor Remote;   Poor  Concentration:  Concentration: Fair and Attention Span: Fair  Recall:  Poor  Attention  Poor  Eye Contact:  Fair  Speech:  Slow  Language:  Fair  Volume:  Decreased  Mood: Mildly labile  Affect:  Constricted and Restricted  Thought Process:  Disorganized  Thought Content:  Delusions and Paranoid Ideation  Suicidal Thoughts:  No  Homicidal Thoughts:  No  Judgement:  Impaired  Insight:  Lacking  Psychomotor Activity:  Decreased  Akathisia:  No  Fund of Knowledge:  Poor      Assets:  Communication Skills Housing Social Support  Cognition:  Impaired,  Moderate  ADL's:  Impaired  AIMS (if indicated):        Other History   These have been pulled in through the EMR, reviewed, and updated if appropriate.  Family History:  The patient's family history is not on file.  Medical History: Past Medical History:  Diagnosis Date   Asthma    COPD (chronic obstructive pulmonary disease) (HCC)    Dementia (HCC)    Hypertension     Pneumothorax     Surgical History: Past Surgical History:  Procedure Laterality Date   CHEST TUBE INSERTION     VIDEO ASSISTED THORACOSCOPY Right 02/01/2018   Procedure: VIDEO ASSISTED THORACOSCOPY, BLEB STAPLING;  Surgeon: Fleeta Hanford Coy, MD;  Location: [facility] OR;  Service: Thoracic;  Laterality: Right;     Medications:   Current Facility-Administered Medications:    acetaminophen  (TYLENOL ) tablet 650 mg, 650 mg, Oral, Q6H PRN, 650 mg at 05/11/24 0608 **OR** acetaminophen  (TYLENOL ) suppository 650 mg, 650 mg, Rectal, Q6H PRN, Melvin, Alexander B, MD   ARIPiprazole  (ABILIFY ) tablet 2 mg, 2 mg, Oral, Daily, Vonte Rossin, MD, 2 mg at 05/11/24 9175   Gerhardt's butt cream, , Topical, BID, Samtani, Jai-Gurmukh, MD, Given at 05/11/24 0824   multivitamin with minerals tablet 1 tablet, 1 tablet, Oral, Daily, Samtani, Jai-Gurmukh, MD, 1 tablet at 05/11/24 9175   piperacillin -tazobactam (ZOSYN ) IVPB 3.375 g, 3.375 g, Intravenous, Q8H, Samtani, Jai-Gurmukh, MD, Last Rate: 12.5 mL/hr at 05/11/24 0608, 3.375 g at 05/11/24 0608   sodium chloride  flush (NS) 0.9 % injection 3 mL, 3 mL, Intravenous, Q12H, Melvin, Alexander B, MD, 3 mL at 05/10/24 2153  Allergies: Allergies  Allergen Reactions   Aspirin Shortness Of Breath   Shellfish Allergy Anaphylaxis    [name], MD

## 2024-05-11 NOTE — Progress Notes (Signed)
 PROGRESS NOTE    Carolyn Huynh  FMW:[id number] DOB: [birth year] DOA: 05/03/2024 PCP: Patient, [name]  Subjective:  No acute events overnight. Seen and examined at bedside. No new complaints. Reports no discharge today. Tolerating oral intake without n/v. Denies constipation.   Hospital Course: 68 year old female with longstanding schizophrenia, delusional disorder, EtOH abuse with psychiatric hospitalization in 2023, pneumothorax with surgery to left chest in 2000 complicated by spontaneous pneumothoraces managed by Dr. Vantrigt-most recent admission for this 2019 undergoing a right VATS with stapling of blebs and mechanical pleurodesis. Brought to ED from home with daughter increasing weakness red water stools. Evaluation in the ED no obvious hemorrhoids had bright red blood per rectum. FOBT was positive    Assessment and Plan:  Perianal fistula Buttock abscess Continue on zosyn  Plan to transition to augmentin to finish 7 day course Continue with local wound care as per wound care team recommendations   Stercoral colitis Resolved with bowel regimen Monitor for regular BMs   Renal cyst Noted on CT abdomen/pelvis to be 2.9cm Will need outpatient workup   Schizophrenia Patient lacks capacity for medication decision making Stop Invega  Start abilify  Psych following Will need to follow up with psychiatry   Moderate Alzheimer's dementia Monitor clinically   Bright red blood per rectum Repeat FOBT 11/20 negative GI signed off as patient refused colonoscopy and will not drink preparation Monitor clinically  DVT prophylaxis: SCDs Start: 05/03/24 1514  SCDs   Code Status: Full Code Disposition Plan: SNF Reason for continuing need for hospitalization: IV antibiotics, placement  Objective: Vitals:   05/11/24 0218 05/11/24 0441 05/11/24 0816 05/11/24 1134  BP: 130/79 124/80 134/82 130/75  Pulse: 73 74 70 69  Resp: 17     Temp: (!) 97.5 F (36.4 C) 97.9 F (36.6 C)  97.9 F (36.6 C) 97.8 F (36.6 C)  TempSrc: Oral Oral    SpO2: 95% 94% 97% 97%  Weight:        Intake/Output Summary (Last 24 hours) at 05/11/2024 1317 Last data filed at 05/10/2024 2100 Gross per 24 hour  Intake 118 ml  Output --  Net 118 ml   Filed Weights   05/05/24 1522  Weight: 47.2 kg    Examination:  Physical Exam Vitals and nursing note reviewed.  Constitutional:      General: She is not in acute distress.    Appearance: She is ill-appearing.  Cardiovascular:     Rate and Rhythm: Normal rate and regular rhythm.     Pulses: Normal pulses.     Heart sounds: Normal heart sounds.  Pulmonary:     Effort: Pulmonary effort is normal.     Breath sounds: Normal breath sounds.  Abdominal:     General: Bowel sounds are normal.     Palpations: Abdomen is soft.  Neurological:     Mental Status: She is alert.     Data Reviewed: I have personally reviewed following labs and imaging studies  CBC: Recent Labs  Lab 05/05/24 0526 05/05/24 1442 05/08/24 0536 05/09/24 0443 05/10/24 0327  WBC 10.7* 10.1 7.2 6.4 7.1  NEUTROABS 8.7* 8.5* 4.8 4.0 4.7  HGB 12.7 13.2 11.9* 11.7* 11.1*  HCT 38.8 40.5 36.6 36.6 34.6*  MCV 89.4 89.6 90.1 90.6 89.4  PLT 294 291 332 362 380   Basic Metabolic Panel: Recent Labs  Lab 05/05/24 1442 05/07/24 1900 05/08/24 0536 05/09/24 0443 05/10/24 0327  NA 136 139 137 137 136  K 3.4* 3.8 4.0 3.9 3.7  CL 99 99 101 100 102  CO2 25 27 27 28 26   GLUCOSE 160* 104* 104* 141* 109*  BUN 17 9 9 9 10   CREATININE 0.83 0.84 1.01* 1.02* 1.14*  CALCIUM 8.6* 8.4* 8.3* 8.5* 8.3*   GFR: Estimated Creatinine Clearance: 34.2 mL/min (A) (by C-G formula based on SCr of 1.14 mg/dL (H)). Liver Function Tests: Recent Labs  Lab 05/08/24 0536 05/09/24 0443 05/10/24 0327  AST 16 15 12*  ALT 8 9 8   ALKPHOS 55 54 48  BILITOT 0.3 0.4 0.4  PROT 7.6 7.4 7.2  ALBUMIN 2.0* 2.0* 2.0*   No results for input(s): LIPASE, AMYLASE in the last 168  hours. No results for input(s): AMMONIA in the last 168 hours. Coagulation Profile: No results for input(s): INR, PROTIME in the last 168 hours. Cardiac Enzymes: No results for input(s): CKTOTAL, CKMB, CKMBINDEX, TROPONINI in the last 168 hours. ProBNP, BNP (last 5 results) No results for input(s): PROBNP, BNP in the last 8760 hours. HbA1C: No results for input(s): HGBA1C in the last 72 hours. CBG: No results for input(s): GLUCAP in the last 168 hours. Lipid Profile: No results for input(s): CHOL, HDL, LDLCALC, TRIG, CHOLHDL, LDLDIRECT in the last 72 hours. Thyroid Function Tests: No results for input(s): TSH, T4TOTAL, FREET4, T3FREE, THYROIDAB in the last 72 hours. Anemia Panel: No results for input(s): VITAMINB12, FOLATE, FERRITIN, TIBC, IRON, RETICCTPCT in the last 72 hours. Sepsis Labs: No results for input(s): PROCALCITON, LATICACIDVEN in the last 168 hours.  Recent Results (from the past 240 hours)  Urine Culture (for pregnant, neutropenic or urologic patients or patients with an indwelling urinary catheter)     Status: Abnormal   Collection Time: 05/03/24 12:33 PM   Specimen: Urine, Clean Catch  Result Value Ref Range Status   Specimen Description URINE, CLEAN CATCH  Final   Special Requests   Final    NONE Performed at [facility], [address], [place], [place] [address]    Culture MULTIPLE SPECIES PRESENT, SUGGEST RECOLLECTION (A)  Final   Report Status 05/06/2024 FINAL  Final     Radiology Studies: No results found.  Scheduled Meds:  ARIPiprazole   2 mg Oral Daily   Gerhardt's butt cream   Topical BID   multivitamin with minerals  1 tablet Oral Daily   sodium chloride  flush  3 mL Intravenous Q12H   Continuous Infusions:  piperacillin -tazobactam (ZOSYN )  IV 3.375 g (05/11/24 1228)     LOS: 3 days   Norval Bar, MD  [facility]  05/11/2024, 1:17 PM

## 2024-05-11 NOTE — Plan of Care (Signed)

## 2024-05-12 ENCOUNTER — Encounter (HOSPITAL_COMMUNITY): Payer: Self-pay | Admitting: Internal Medicine

## 2024-05-12 DIAGNOSIS — J9383 Other pneumothorax: Secondary | ICD-10-CM | POA: Diagnosis not present

## 2024-05-12 DIAGNOSIS — K922 Gastrointestinal hemorrhage, unspecified: Secondary | ICD-10-CM | POA: Diagnosis not present

## 2024-05-12 DIAGNOSIS — F200 Paranoid schizophrenia: Secondary | ICD-10-CM | POA: Diagnosis not present

## 2024-05-12 DIAGNOSIS — F01B0 Vascular dementia, moderate, without behavioral disturbance, psychotic disturbance, mood disturbance, and anxiety: Secondary | ICD-10-CM | POA: Diagnosis not present

## 2024-05-12 MED ORDER — AMOXICILLIN-POT CLAVULANATE 875-125 MG PO TABS
1.0000 | ORAL_TABLET | Freq: Two times a day (BID) | ORAL | Status: AC
  Administered 2024-05-12 (×2): 1 via ORAL
  Filled 2024-05-12 (×2): qty 1

## 2024-05-12 NOTE — TOC Progression Note (Signed)
 Transition of Care [facility]) - Progression Note    Patient Details  Name: Carolyn Huynh MRN: [id number] Date of Birth: 1956/06/12  [facility] CM/SW Contact  Sherline Clack, [place] Phone Number: 05/12/2024, 2:18 PM  Clinical Narrative:     CSW spoke with [name] and [name] over the phone. [name] received guardianship paperwork CSW sent via email and has reviewed it. CSW answered some questions about the guardianship process and recommended daughters reach out to a lawyer for more clarity on how to fill out guardianship paperwork. CSW and daughters searched for pro bono services online and daughters will start with Legal Aid in [place]. [name] plans to visit patient at the hospital on Monday and will reach out to CSW when she is on her way. [name] will be taking the bus from [employer].  CSW asked about patient's income to aid in facility search; patient used to get social security checks but after moving to [place] has stopped getting them. Patient has no other income and does not have insurance. Financial counseling to review patient for Medicaid screening. CSW will continue to follow and update DC plan. Daughters aware patient might discharge back home.   Expected Discharge Plan: Skilled Nursing Facility, Home Barriers to Discharge:  (Inpatient Pending Psych Consult)               Expected Discharge Plan and Services                                               Social Drivers of Health (SDOH) Interventions SDOH Screenings   Food Insecurity: No Food Insecurity (05/03/2024)  Housing: Low Risk  (05/03/2024)  Transportation Needs: Unmet Transportation Needs (05/03/2024)  Utilities: Not At Risk (05/03/2024)  Alcohol Screen: Low Risk  (09/09/2021)  Social Connections: Unknown (05/03/2024)  Tobacco Use: Low Risk  (05/12/2024)    Readmission Risk Interventions    12/15/2023    4:42 PM  Readmission Risk Prevention Plan  Post Dischage Appt  Complete  Medication Screening Complete  Transportation Screening Complete

## 2024-05-12 NOTE — Progress Notes (Addendum)
 PROGRESS NOTE    Carolyn Huynh  FMW:[id number] DOB: [birth year] DOA: 05/03/2024 PCP: Patient, [name]  Subjective:  No acute events overnight. Seen and examined at bedside. No new complaints. Tolerating oral intake without n/v. Denies constipation.   Hospital Course: 68 year old female with longstanding schizophrenia, delusional disorder, EtOH abuse with psychiatric hospitalization in 2023, pneumothorax with surgery to left chest in 2000 complicated by spontaneous pneumothoraces managed by Dr. Vantrigt-most recent admission for this 2019 undergoing a right VATS with stapling of blebs and mechanical pleurodesis. Brought to ED from home with daughter increasing weakness red water stools. Evaluation in the ED no obvious hemorrhoids had bright red blood per rectum. FOBT was positive    Assessment and Plan:  Perianal fistula Buttock abscess No surgical intervention as per gynecologic surgery Transition zosyn  to augmentin to finish 5-day course on 11/28 Continue with local wound care as per wound care team recommendations Will need to follow up with surgery outpatient for perianal fistula closure   Stercoral colitis Resolved with bowel regimen Monitor for regular BMs   Renal cyst Noted on CT abdomen/pelvis to be 2.9cm Will need outpatient workup   Schizophrenia Patient lacks capacity for medication decision making Switched Invega  to Abilify  Psych following Will need to follow up with psychiatry outpatient   Moderate Alzheimer's dementia Monitor clinically   Bright red blood per rectum Repeat FOBT 11/20 negative GI signed off as patient refused colonoscopy and will not drink preparation Monitor clinically  DVT prophylaxis: SCDs Start: 05/03/24 1514  SCDs   Code Status: Full Code Disposition Plan: To be determined, likely SNF Reason for continuing need for hospitalization: Placement, SW following  Objective: Vitals:   05/11/24 2038 05/12/24 0035 05/12/24 0452 05/12/24  0729  BP: (!) 142/81 113/68 (!) 150/98 (!) 142/88  Pulse: 79 74 73 74  Resp:      Temp: 97.6 F (36.4 C) 98 F (36.7 C) 97.8 F (36.6 C) 98.1 F (36.7 C)  TempSrc:      SpO2: 100% 100% 94% 97%  Weight:      Height:        Intake/Output Summary (Last 24 hours) at 05/12/2024 1227 Last data filed at 05/11/2024 1228 Gross per 24 hour  Intake 50 ml  Output --  Net 50 ml   Filed Weights   05/05/24 1522 05/11/24 1857  Weight: 47.2 kg 47.2 kg    Examination:  Physical Exam Vitals and nursing note reviewed.  Constitutional:      General: She is not in acute distress.    Appearance: She is ill-appearing (chronically).     Comments: Weak, frail  HENT:     Head: Normocephalic and atraumatic.  Cardiovascular:     Rate and Rhythm: Normal rate and regular rhythm.     Pulses: Normal pulses.     Heart sounds: Normal heart sounds.  Pulmonary:     Effort: Pulmonary effort is normal.     Breath sounds: Normal breath sounds.  Abdominal:     General: Bowel sounds are normal.     Palpations: Abdomen is soft.  Neurological:     Mental Status: She is alert.     Data Reviewed: I have personally reviewed following labs and imaging studies  CBC: Recent Labs  Lab 05/05/24 1442 05/08/24 0536 05/09/24 0443 05/10/24 0327  WBC 10.1 7.2 6.4 7.1  NEUTROABS 8.5* 4.8 4.0 4.7  HGB 13.2 11.9* 11.7* 11.1*  HCT 40.5 36.6 36.6 34.6*  MCV 89.6 90.1 90.6 89.4  PLT 291 332 362 380   Basic Metabolic Panel: Recent Labs  Lab 05/05/24 1442 05/07/24 1900 05/08/24 0536 05/09/24 0443 05/10/24 0327  NA 136 139 137 137 136  K 3.4* 3.8 4.0 3.9 3.7  CL 99 99 101 100 102  CO2 25 27 27 28 26   GLUCOSE 160* 104* 104* 141* 109*  BUN 17 9 9 9 10   CREATININE 0.83 0.84 1.01* 1.02* 1.14*  CALCIUM 8.6* 8.4* 8.3* 8.5* 8.3*   GFR: Estimated Creatinine Clearance: 33 mL/min (A) (by C-G formula based on SCr of 1.14 mg/dL (H)). Liver Function Tests: Recent Labs  Lab 05/08/24 0536 05/09/24 0443  05/10/24 0327  AST 16 15 12*  ALT 8 9 8   ALKPHOS 55 54 48  BILITOT 0.3 0.4 0.4  PROT 7.6 7.4 7.2  ALBUMIN 2.0* 2.0* 2.0*   No results for input(s): LIPASE, AMYLASE in the last 168 hours. No results for input(s): AMMONIA in the last 168 hours. Coagulation Profile: No results for input(s): INR, PROTIME in the last 168 hours. Cardiac Enzymes: No results for input(s): CKTOTAL, CKMB, CKMBINDEX, TROPONINI in the last 168 hours. ProBNP, BNP (last 5 results) No results for input(s): PROBNP, BNP in the last 8760 hours. HbA1C: No results for input(s): HGBA1C in the last 72 hours. CBG: No results for input(s): GLUCAP in the last 168 hours. Lipid Profile: No results for input(s): CHOL, HDL, LDLCALC, TRIG, CHOLHDL, LDLDIRECT in the last 72 hours. Thyroid Function Tests: No results for input(s): TSH, T4TOTAL, FREET4, T3FREE, THYROIDAB in the last 72 hours. Anemia Panel: No results for input(s): VITAMINB12, FOLATE, FERRITIN, TIBC, IRON, RETICCTPCT in the last 72 hours. Sepsis Labs: No results for input(s): PROCALCITON, LATICACIDVEN in the last 168 hours.  Recent Results (from the past 240 hours)  Urine Culture (for pregnant, neutropenic or urologic patients or patients with an indwelling urinary catheter)     Status: Abnormal   Collection Time: 05/03/24 12:33 PM   Specimen: Urine, Clean Catch  Result Value Ref Range Status   Specimen Description URINE, CLEAN CATCH  Final   Special Requests   Final    NONE Performed at [facility], [address], [place], [place] [address]    Culture MULTIPLE SPECIES PRESENT, SUGGEST RECOLLECTION (A)  Final   Report Status 05/06/2024 FINAL  Final     Radiology Studies: No results found.  Scheduled Meds:  amoxicillin-clavulanate  1 tablet Oral Q12H   ARIPiprazole   2 mg Oral Daily   Gerhardt's butt cream   Topical BID   multivitamin with minerals  1 tablet Oral Daily   sodium  chloride flush  3 mL Intravenous Q12H   Continuous Infusions:   LOS: 4 days   Norval Bar, MD  [facility]  05/12/2024, 12:27 PM

## 2024-05-12 NOTE — Consult Note (Signed)
 Carolyn Huynh  Patient Name: [name]  MRN: [id number]  DOB: 1956-03-19  Consult Order details:  Orders (From admission, onward)     Start     Ordered   05/05/24 1340  IP CONSULT TO PSYCHIATRY       Ordering Provider: Samtani, Jai-Gurmukh, MD  Provider:  (Not yet assigned)  Question Answer Comment  Location [facility]   Reason for Consult? shcizooid--non complait on meds---needs to be seen--read my note pls      05/05/24 1341             Mode of Visit: In person    Psychiatry Consult Evaluation  Service Date: May 12, 2024 LOS:  LOS: 4 days  Chief Complaint the patient is a 68 year old female with a past history of schizoaffective disorder/schizophrenia and a history of mild cognitive impairment referred for medication management.  Primary Psychiatric Diagnoses  Schizophrenia, paranoid type 2.  Delusional disorder 3.  Vascular dementia, moderate  Assessment  Carolyn Huynh is a 68 y.o. female admitted: Medicallyfor 05/03/2024  4:48 AM for increasing weakness and watery stools.. She carries the psychiatric diagnoses of delusional disorder/schizophrenia and has a past medical history of vascular dementia.   The patient's current presentation with delusions, thinking that she is adopted and that she was born in [place] and she was fluent in Chinese is consistent with schizophrenia and delusional disorder.  Patient lacks insight and some of her delusions are quite fixed and have been consistent for many years.  Antipsychotics appear to help her when she is in the hospital but she has been noncompliant. Given her noncompliance we will consider starting her on Invega  and if she improves on this medication with no side effects we will consider Invega  Sustenna for long-term relief. Given her vascular dementia, antipsychotics will be used with caution.  05/09/2024: A family meeting was scheduled with the LCSW, palliative care  the patient's children and this clinical research associate.  However the patient's children did not show up for the meeting.  Interviewed the patient and discussed the case with palliative care, Carolyn Plants, NP and the social Carolyn Huynh, [place], [place].  All agree that the patient has multiple comorbidities along with her chronic psychiatric disease with fixed delusions.  She continues to lack capacity but has been refusing interventions and using poor judgment.  Ideally it would be better for the family to obtain guardianship but that might be a long process.  In the meantime, we will continue to treat with antipsychotic and transition to a long-acting injectable when appropriate.  Although this may help with some of her symptoms, it is unlikely that she will be significantly improved to make her own decisions.  05/10/2024: No significant change although more cooperative.  Will consider giving her long-acting injectable Invega  if the pharmacist can provide the low dose of 39 mg. (Tried to place the order but based on her creatinine clearance contraindication is showing up for long-acting injectable.) 05/11/2024: The patient was seen and reevaluated.  She is essentially unchanged and continues to have the same fixed delusions but is more pleasant and cooperative.  Although the Invega  1.5 mg appears to be effective, there is a relative contraindication given her creatinine clearance.  The plan is to change her to Abilify  2 mg a day.  If she tolerates this we can consider an long-acting injection of Abilify . 05/12/2024: The patient was seen and reevaluated and chart was reviewed.  She has been compliant with medications  and no agitation or behavioral problems noted.  Placement is still an issue because family consists of 3's daughters and 2 of them are not in town.  Although long-acting injectable was entertained, the patient is taking oral medications at this time and is going to be in a facility, it would be  better off to keep her on the oral.  If she is going home, would consider a long-acting injectable.  Full recommendations below.  Diagnoses:  Active Hospital problems: Principal Problem:   Acute GI bleeding Active Problems:   Recurrent spontaneous pneumothorax   Schizophrenia, paranoid type (HCC)   Severe manic bipolar 1 disorder with psychotic behavior (HCC)   Vascular dementia with delusions (HCC)   Hypertension   Severe alcohol use disorder, in sustained remission (HCC)   Constipation   Heme positive stool   Abdominal cramping   Protein-calorie malnutrition, severe    Plan   ## Psychiatric Medication Recommendations:  Discontinue Invega  1.5 mg a day Begin Abilify  2 mg a day ## Medical Decision Making Capacity: Not specifically addressed in this encounter Patient does not have capacity to refuse medical care at this time.  ## Further Work-up:  -- Per the hospitalist EKG -- most recent EKG on 05/05/2024 had QtCb of 452 -- Pertinent labwork reviewed earlier this admission includes: As per the hospitalist - Recommend neurology Huynh for vascular dementia and possibly starting on medications if indicated.  ## Disposition:-- Plan Post Discharge/Psychiatric Care Huynh resources if patient ends up on long-acting injectable, she will need routine Huynh with psychiatry.  Recommend TOCfollow-up prior to discharge  ## Behavioral / Environmental: -Delirium Precautions: Delirium Interventions for Nursing and Staff: - RN to open blinds every AM. - To Bedside: Glasses, hearing aide, and pt's own shoes. Make available to patients. when possible and encourage use. - Encourage po fluids when appropriate, keep fluids within reach. - OOB to chair with meals. - Passive ROM exercises to all extremities with AM & PM care. - RN to assess orientation to person, time and place QAM and PRN. - Recommend extended visitation hours with familiar family/friends as feasible. - Staff to minimize  disturbances at night. Turn off television when pt asleep or when not in use. or Utilize compassion and acknowledge the patient's experiences while setting clear and realistic expectations for care.    ## Safety and Observation Level:  - Based on my clinical evaluation, I estimate the patient to be at low risk of self harm in the current setting. - At this time, we recommend  routine. This decision is based on my review of the chart including patient's history and current presentation, interview of the patient, mental status examination, and consideration of suicide risk including evaluating suicidal ideation, plan, intent, suicidal or self-harm behaviors, risk factors, and protective factors. This judgment is based on our ability to directly address suicide risk, implement suicide prevention strategies, and develop a safety plan while the patient is in the clinical setting. Please contact our team if there is a concern that risk level has changed.  CSSR Risk Category:C-SSRS RISK CATEGORY: No Risk  Suicide Risk Assessment: Patient has following modifiable risk factors for suicide: medication noncompliance and triggering events, which we are addressing by outpatient Huynh appointment and medication management. Patient has following non-modifiable or demographic risk factors for suicide: psychiatric hospitalization Patient has the following protective factors against suicide: Access to outpatient mental health care and Supportive family  Thank you for this consult request. Recommendations have been communicated to the  primary team.  We will continue to follow at this time.   [name], MD       History of Present Illness  Relevant Aspects of [facility] Course:  Admitted on 05/03/2024 for weakness and nausea and vomiting. They the following her for her medical condition including pneumothorax, diverticulosis..   Patient Report:  The patient is a 68 year old female who lives with  her daughter in [place]  but is originally from [place] .  Collateral from the family indicates that the patient has a long history of alcoholism and psychosis they remember event when she was young, drinking beer and talking to herself.  She has always been delusional and tends to talk to herself all the time.  She had a few hospitalizations for psychosis in [place]  but was mainly hospitalized for her alcoholism, lung disease and other medical conditions.  She moved to [place]  to stay with one of her daughters.  Patient has 3 daughters.  Apparently she has been hospitalized twice in 2021 and 23 for psychosis but has been noncompliant with medications.  Her last hospitalization 2023 to [facility] and she was placed on Zyprexa  which she promptly stopped after discharge. 05/06/2024: The patient was seen and evaluated.  She is lying in bed and appears fairly emaciated and dehydrated.  She is oriented to place and person only.  She maintained fair to good eye contact.  Her speech was low volume without any looseness of associations or flight of ideas.  She reports she lives with her daughter in   for the last 5 to 6 years.  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  When asked if you can speak Chinese she started speaking some gibberish that sounded like Chinese.  Although she did not get agitated she continued to ramble but in general was calm during this interview.  She is not a great historian and was unable to give any details but gave me permission to talk to her daughters.  She denied any active SI/HI/AVH.  She has minimal insight and judgment and at the present time suspect that she lacks capacity to make informed decisions.  Will continue to evaluate her status after initiating a low-dose antipsychotic. 05/07/2024: The patient was seen and reevaluated today.  She is alert oriented and cooperative and in general is  fairly pleasant without any agitation or behavioral disturbance.  However she remains delusional and still believes that she is a physician and that she was born and raised in [place].  Other than these fixed delusions patient denies any auditory visual hallucinations.  She denies suicidal ideations.  She has minimal insight and reports that she would prefer to live on her own and stay with her daughter. 05/08/2024: The patient was seen and reevaluated today.  She is alert oriented and cooperative and in general is fairly pleasant without any agitation or behavioral disturbance. Patient remains delusional stating that she is a doctor. She does not have any insight into why she is in the hospital but she has been compliant with treatment and has not required any medications for agitation. The patient states that she does not need any medications for mental health and denies any SI/HI/AVH or concerns that the treatment team is trying to hurt her.  05/09/2024: The patient is seen and evaluated today.  In addition discussed with the team.  The patient is oriented and in general cooperative and fairly pleasant until  discussing her treatment plan.  She then becomes very aggressive verbally and claims that she is a clinical research associate and a doctor and that she was born in [place] and started showing some agitation.  She shows very poor insight and judgment.  However she clearly denies any active SI/HI/AVH. 05/10/2024: The patient was seen and reevaluated.  In general the patient is oriented and cooperative and fairly pleasant.  She is not as verbally aggressive today.  She reports that none of her children visited her.  And examined she is alert oriented and cooperative.  She reports no SI/HI/AVH.  However she remains with fixed delusions and claims that she is a doctor and a lawyer and that next time I come there she is going to show me her diploma.  Other than some of the fixed beliefs, patient appears fairly  cooperative. 05/11/2024: The patient was seen and evaluated today.  She is laying in bed and appears to be in no acute distress.  Her speech is coherent without any obvious looseness of association or flight of ideas or tangentiality.  She still maintains the same delusions and continues to insist that she has a doctor and a lawyer but does not appear to be currently answering to herself either today.  Overall she denies active SI/HI/AVH and is redirectable.  Given her creatinine clearance will change from Invega  to Abilify . 05/12/2024: Patient was seen and reevaluated.  No significant change.  She still has her fixed delusions but has not been a behavioral problem and has not demonstrated any agitation.  Today she denies any active SI/HI/AVH. She is now on Abilify  2 mg a day.  In this patient if, she is going to have a SNF facility, long-acting injectable may not be needed since she will be monitored and medications will be provided.  However if she goes home to family, it may be worthwhile investigating LAI. Psych ROS:  Depression: Denied Anxiety: Denied Mania (lifetime and current): Unknown Psychosis: (lifetime and current): Lifelong history of psychosis and delusions  Collateral information:  Contacted patient's 2 daughters Estefana and [name] at [phone] on 05/06/2024 Both the daughters are good historians and report that they have been taking care of the mother and that she has always been delusional and paranoid which seems to exacerbate when she is drinking.  They have been also trying to get guardianship but so far they do not have guardianship.  Patient lives with one of her daughters. Her daughters are very supportive and are willing to be involved in discharge planning.   Daughter [name] Cassis [phone] 05/08/2024 She reports that she has attempted to get guardianship over her mother in the past however she does not have the money to pay for a lawyer. She is asking for assistance  with this process because she does not think that her mother will stay compliant with her treatment when she leaves the hospital and in the past she has not been forthcoming with her discharge paperwork making it more difficult for the family to help her with treatment.  On 05/12/2024, contacted Estefana Flatter who reports that they are continuing to pursue guardianship.  Her sister who lives in [place] plans to come and visit the mother but apparently has been having some trouble with her car. Review of Systems  Psychiatric/Behavioral:  Positive for hallucinations. The patient is nervous/anxious.      Psychiatric and Social History  Psychiatric History:  Information collected from medical records and family.  Prev Dx/Sx: Schizophrenia, delusional disorder, vascular  dementia Current Psych Provider: Last hospitalized at [facility]. Home Meds (current): Zyprexa , noncompliant Previous Med Trials: Unknown Therapy: Unknown  Prior Psych Hospitalization: At least 2 hospitalizations in [place]  in 2021 at [facility] in 2023 at [facility] Prior Self Harm: None Prior Violence: None  Family Psych History: Unknown Family Hx suicide: Unknown  Social History:  The patient is single, currently living with her daughter in [place] .  Prior to that she was living in [place]  with her other daughter. Access to weapons/lethal means: None  Substance History Extensive history of alcoholism.  Exam Findings  Physical Exam: Per the hospitalist Vital Signs:  Temp:  [97.5 F (36.4 C)-98.4 F (36.9 C)] 98.1 F (36.7 C) (11/28 0729) Pulse Rate:  [69-79] 74 (11/28 0729) Resp:  [20] 20 (11/27 1857) BP: (113-150)/(68-98) 142/88 (11/28 0729) SpO2:  [94 %-100 %] 97 % (11/28 0729) Weight:  [47.2 kg] 47.2 kg (11/27 1857) Blood pressure (!) 142/88, pulse 74, temperature 98.1 F (36.7 C), resp. rate 20, height 5' (1.524 m), weight 47.2 kg, SpO2 97%. Body mass index is 20.32 kg/m.  Physical  Exam Constitutional:      Appearance: Normal appearance. She is ill-appearing.  Neurological:     Mental Status: She is alert. Mental status is at baseline.     Mental Status Exam: General Appearance: Casual and Disheveled  Orientation:  Other:  Oriented to place and person only  Memory:  Immediate;   Fair Recent;   Poor Remote;   Poor  Concentration:  Concentration: Fair and Attention Span: Fair  Recall:  Poor  Attention  Poor  Eye Contact:  Fair  Speech:  Slow  Language:  Fair  Volume:  Decreased  Mood: Mildly labile  Affect:  Constricted and Restricted  Thought Process:  Disorganized  Thought Content:  Delusions and Paranoid Ideation  Suicidal Thoughts:  No  Homicidal Thoughts:  No  Judgement:  Impaired  Insight:  Lacking  Psychomotor Activity:  Decreased  Akathisia:  No  Fund of Knowledge:  Poor      Assets:  Communication Skills Housing Social Support  Cognition:  Impaired,  Moderate  ADL's:  Impaired  AIMS (if indicated):        Other History   These have been pulled in through the EMR, reviewed, and updated if appropriate.  Family History:  The patient's family history is not on file.  Medical History: Past Medical History:  Diagnosis Date   Asthma    COPD (chronic obstructive pulmonary disease) (HCC)    Dementia (HCC)    Hypertension    Pneumothorax     Surgical History: Past Surgical History:  Procedure Laterality Date   CHEST TUBE INSERTION     VIDEO ASSISTED THORACOSCOPY Right 02/01/2018   Procedure: VIDEO ASSISTED THORACOSCOPY, BLEB STAPLING;  Surgeon: Fleeta Hanford Coy, MD;  Location: [facility] OR;  Service: Thoracic;  Laterality: Right;     Medications:   Current Facility-Administered Medications:    acetaminophen  (TYLENOL ) tablet 650 mg, 650 mg, Oral, Q6H PRN, 650 mg at 05/11/24 2111 **OR** acetaminophen  (TYLENOL ) suppository 650 mg, 650 mg, Rectal, Q6H PRN, Melvin, Alexander B, MD   amoxicillin-clavulanate (AUGMENTIN) 875-125 MG per tablet  1 tablet, 1 tablet, Oral, Q12H, Tariq, Hassan, MD   ARIPiprazole  (ABILIFY ) tablet 2 mg, 2 mg, Oral, Daily, Raevon Broom, MD, 2 mg at 05/12/24 9077   Gerhardt's butt cream, , Topical, BID, Samtani, Jai-Gurmukh, MD, Given at 05/12/24 9077   multivitamin with minerals tablet 1 tablet, 1 tablet, Oral, Daily,  Samtani, Jai-Gurmukh, MD, 1 tablet at 05/12/24 9077   sodium chloride  flush (NS) 0.9 % injection 3 mL, 3 mL, Intravenous, Q12H, Melvin, Alexander B, MD, 3 mL at 05/11/24 2111  Allergies: Allergies  Allergen Reactions   Aspirin Shortness Of Breath   Shellfish Allergy Anaphylaxis    [name], MD

## 2024-05-12 NOTE — Plan of Care (Signed)

## 2024-05-13 LAB — CREATININE, SERUM
Creatinine, Ser: 0.82 mg/dL (ref 0.44–1.00)
GFR, Estimated: >60 mL/min (ref >60)

## 2024-05-13 LAB — CBC
HCT: 38.2 % (ref 36.0–46.0)
Hemoglobin: 12.2 g/dL (ref 12.0–15.0)
MCH: 29 pg (ref 26.0–34.0)
MCHC: 31.9 g/dL (ref 30.0–36.0)
MCV: 90.7 fL (ref 80.0–100.0)
Platelets: 429 K/uL — ABNORMAL HIGH (ref 150–400)
RBC: 4.21 MIL/uL (ref 3.87–5.11)
RDW: 13.7 % (ref 11.5–15.5)
WBC: 7.2 K/uL (ref 4.0–10.5)
nRBC: 0 % (ref 0.0–0.2)

## 2024-05-13 MED ORDER — ENOXAPARIN SODIUM 40 MG/0.4ML IJ SOSY
40.0000 mg | PREFILLED_SYRINGE | INTRAMUSCULAR | Status: DC
  Administered 2024-05-13 – 2024-05-29 (×16): 40 mg via SUBCUTANEOUS
  Filled 2024-05-13 (×17): qty 0.4

## 2024-05-13 NOTE — Plan of Care (Signed)

## 2024-05-13 NOTE — Progress Notes (Signed)
 PROGRESS NOTE    Carolyn Huynh  FMW:[id number] DOB: September 21, 1955 DOA: 05/03/2024 PCP: Patient, [name]  Subjective:   No acute events overnight. Seen and examined at bedside. No new complaints. Tolerating oral intake without n/v. Denies constipation.   Hospital Course: 68 year old female with longstanding schizophrenia, delusional disorder, EtOH abuse with psychiatric hospitalization in 2023, pneumothorax with surgery to left chest in 2000 complicated by spontaneous pneumothoraces managed by Dr. Vantrigt-most recent admission for this 2019 undergoing a right VATS with stapling of blebs and mechanical pleurodesis. Brought to ED from home with daughter increasing weakness red water stools. Evaluation in the ED no obvious hemorrhoids had bright red blood per rectum. FOBT was positive    Assessment and Plan:  Perianal fistula Buttock abscess No surgical intervention as per gynecologic surgery Finished 5 days of antibiotics course on 11/28 Continue with local wound care as per wound care team recommendations Will need to follow up with surgery outpatient for perianal fistula closure   Stercoral colitis Resolved with bowel regimen Monitor for regular BMs   Renal cyst Noted on CT abdomen/pelvis to be 2.9cm Will need outpatient workup   Schizophrenia Patient lacks capacity for medication decision making Cont Abilify  Psych following Will need to follow up with psychiatry outpatient   Moderate Alzheimer's dementia Monitor clinically   Bright red blood per rectum Repeat FOBT 11/20 negative GI signed off as patient refused colonoscopy and will not drink preparation Monitor clinically   DVT prophylaxis: enoxaparin  (LOVENOX ) injection 40 mg Start: 05/13/24 1100  Lovenox    Code Status: Full Code Disposition Plan: TBD, likely SNF Reason for continuing need for hospitalization: Medically ready, placement, SW following  Objective: Vitals:   05/12/24 1619 05/12/24 2004 05/13/24  0544 05/13/24 0741  BP: (!) 147/84 (!) 145/99 139/86 129/81  Pulse: 82 82 77 72  Resp: 17   17  Temp: 98.2 F (36.8 C) 98.3 F (36.8 C) 98.5 F (36.9 C) 97.8 F (36.6 C)  TempSrc: Oral Oral  Oral  SpO2: 94% 93% 96% 96%  Weight:      Height:        Intake/Output Summary (Last 24 hours) at 05/13/2024 1006 Last data filed at 05/12/2024 1638 Gross per 24 hour  Intake 100 ml  Output --  Net 100 ml   Filed Weights   05/05/24 1522 05/11/24 1857  Weight: 47.2 kg 47.2 kg    Examination:  Physical Exam Vitals and nursing note reviewed.  Constitutional:      General: She is not in acute distress.    Appearance: She is ill-appearing (chronically).     Comments: Weak, frail, cachectic  HENT:     Head: Normocephalic and atraumatic.  Cardiovascular:     Rate and Rhythm: Normal rate and regular rhythm.     Pulses: Normal pulses.     Heart sounds: Normal heart sounds.  Pulmonary:     Effort: Pulmonary effort is normal.     Breath sounds: Normal breath sounds.  Abdominal:     General: Bowel sounds are normal.     Palpations: Abdomen is soft.  Neurological:     Mental Status: She is alert.     Data Reviewed: I have personally reviewed following labs and imaging studies  CBC: Recent Labs  Lab 05/08/24 0536 05/09/24 0443 05/10/24 0327  WBC 7.2 6.4 7.1  NEUTROABS 4.8 4.0 4.7  HGB 11.9* 11.7* 11.1*  HCT 36.6 36.6 34.6*  MCV 90.1 90.6 89.4  PLT 332 362 380  Basic Metabolic Panel: Recent Labs  Lab 05/07/24 1900 05/08/24 0536 05/09/24 0443 05/10/24 0327  NA 139 137 137 136  K 3.8 4.0 3.9 3.7  CL 99 101 100 102  CO2 27 27 28 26   GLUCOSE 104* 104* 141* 109*  BUN 9 9 9 10   CREATININE 0.84 1.01* 1.02* 1.14*  CALCIUM 8.4* 8.3* 8.5* 8.3*   GFR: Estimated Creatinine Clearance: 33 mL/min (A) (by C-G formula based on SCr of 1.14 mg/dL (H)). Liver Function Tests: Recent Labs  Lab 05/08/24 0536 05/09/24 0443 05/10/24 0327  AST 16 15 12*  ALT 8 9 8   ALKPHOS 55  54 48  BILITOT 0.3 0.4 0.4  PROT 7.6 7.4 7.2  ALBUMIN 2.0* 2.0* 2.0*   No results for input(s): LIPASE, AMYLASE in the last 168 hours. No results for input(s): AMMONIA in the last 168 hours. Coagulation Profile: No results for input(s): INR, PROTIME in the last 168 hours. Cardiac Enzymes: No results for input(s): CKTOTAL, CKMB, CKMBINDEX, TROPONINI in the last 168 hours. ProBNP, BNP (last 5 results) No results for input(s): PROBNP, BNP in the last 8760 hours. HbA1C: No results for input(s): HGBA1C in the last 72 hours. CBG: No results for input(s): GLUCAP in the last 168 hours. Lipid Profile: No results for input(s): CHOL, HDL, LDLCALC, TRIG, CHOLHDL, LDLDIRECT in the last 72 hours. Thyroid Function Tests: No results for input(s): TSH, T4TOTAL, FREET4, T3FREE, THYROIDAB in the last 72 hours. Anemia Panel: No results for input(s): VITAMINB12, FOLATE, FERRITIN, TIBC, IRON, RETICCTPCT in the last 72 hours. Sepsis Labs: No results for input(s): PROCALCITON, LATICACIDVEN in the last 168 hours.  Recent Results (from the past 240 hours)  Urine Culture (for pregnant, neutropenic or urologic patients or patients with an indwelling urinary catheter)     Status: Abnormal   Collection Time: 05/03/24 12:33 PM   Specimen: Urine, Clean Catch  Result Value Ref Range Status   Specimen Description URINE, CLEAN CATCH  Final   Special Requests   Final    NONE Performed at [facility], [address] [place], [place] [address]    Culture MULTIPLE SPECIES PRESENT, SUGGEST RECOLLECTION (A)  Final   Report Status 05/06/2024 FINAL  Final     Radiology Studies: No results found.  Scheduled Meds:  ARIPiprazole   2 mg Oral Daily   Gerhardt's butt cream   Topical BID   multivitamin with minerals  1 tablet Oral Daily   sodium chloride  flush  3 mL Intravenous Q12H   Continuous Infusions:   LOS: 5 days   Norval Bar,  MD  [facility]  05/13/2024, 10:06 AM

## 2024-05-14 DIAGNOSIS — F22 Delusional disorders: Secondary | ICD-10-CM

## 2024-05-14 DIAGNOSIS — F0152 Vascular dementia, unspecified severity, with psychotic disturbance: Secondary | ICD-10-CM | POA: Diagnosis not present

## 2024-05-14 MED ORDER — ENSURE PLUS HIGH PROTEIN PO LIQD
237.0000 mL | Freq: Two times a day (BID) | ORAL | Status: DC
  Administered 2024-05-15 – 2024-05-29 (×16): 237 mL via ORAL

## 2024-05-14 NOTE — Progress Notes (Signed)
 PROGRESS NOTE    Carolyn Huynh  FMW:[id number] DOB: 08/06/1955 DOA: 05/03/2024 PCP: Patient, [name]  Subjective:  No acute events overnight. Seen and examined at bedside. Reports having mild L ear fullness associated pain that is better than before. Tolerating oral intake without n/v. Denies constipation.    Hospital Course: 68 year old female with longstanding schizophrenia, delusional disorder, EtOH abuse with psychiatric hospitalization in 2023, pneumothorax with surgery to left chest in 2000 complicated by spontaneous pneumothoraces managed by Carolyn Huynh recent admission for this 2019 undergoing a right VATS with stapling of blebs and mechanical pleurodesis. Brought to ED from home with Carolyn Huynh increasing weakness red water stools. Evaluation in the ED no obvious hemorrhoids had bright red blood per rectum. FOBT was positive    Assessment and Plan:  Perianal fistula Buttock abscess No surgical intervention as per gynecologic surgery Finished 5 days of antibiotics course on 11/28 Continue with local wound care as per wound care team recommendations Will need to follow up with surgery outpatient for perianal fistula closure   Stercoral colitis Resolved with bowel regimen Monitor for regular BMs   Renal cyst Noted on CT abdomen/pelvis to be 2.9cm Will need outpatient workup   Schizophrenia Patient lacks capacity for medication decision making Cont Abilify  Psych following Will need to follow up with psychiatry outpatient   Moderate Alzheimer's dementia Monitor clinically   Bright red blood per rectum Repeat FOBT 11/20 negative GI signed off as patient refused colonoscopy and will not drink preparation Monitor clinically  DVT prophylaxis: enoxaparin  (LOVENOX ) injection 40 mg Start: 05/13/24 1100 SCDs Start: 05/13/24 1011  Lovenox    Code Status: Full Code Disposition Plan: TBD, likely SNF Reason for continuing need for hospitalization: medically ready,  placement pending, SW following  Objective: Vitals:   05/13/24 2026 05/13/24 2337 05/14/24 0417 05/14/24 0819  BP: 128/84 (!) 146/86 133/84 137/81  Pulse: 77 72 72 74  Resp: 20 20 19 16   Temp: (!) 97.4 F (36.3 C) 97.7 F (36.5 C) 97.9 F (36.6 C) 97.9 F (36.6 C)  TempSrc: Oral Oral Oral Oral  SpO2: 97% 96% 99% 97%  Weight:      Height:        Intake/Output Summary (Last 24 hours) at 05/14/2024 1058 Last data filed at 05/14/2024 0424 Gross per 24 hour  Intake 417 ml  Output --  Net 417 ml   Filed Weights   05/05/24 1522 05/11/24 1857  Weight: 47.2 kg 47.2 kg    Examination:  Physical Exam Vitals and nursing note reviewed.  Constitutional:      General: She is not in acute distress.    Appearance: She is ill-appearing.     Comments: Weak, frail, cachectic  HENT:     Head: Normocephalic and atraumatic.  Cardiovascular:     Rate and Rhythm: Normal rate and regular rhythm.     Pulses: Normal pulses.     Heart sounds: Normal heart sounds.  Pulmonary:     Effort: Pulmonary effort is normal.     Breath sounds: Normal breath sounds.  Abdominal:     General: Bowel sounds are normal.     Palpations: Abdomen is soft.  Neurological:     Mental Status: She is alert.     Data Reviewed: I have personally reviewed following labs and imaging studies  CBC: Recent Labs  Lab 05/08/24 0536 05/09/24 0443 05/10/24 0327 05/13/24 1027  WBC 7.2 6.4 7.1 7.2  NEUTROABS 4.8 4.0 4.7  --   HGB  11.9* 11.7* 11.1* 12.2  HCT 36.6 36.6 34.6* 38.2  MCV 90.1 90.6 89.4 90.7  PLT 332 362 380 429*   Basic Metabolic Panel: Recent Labs  Lab 05/07/24 1900 05/08/24 0536 05/09/24 0443 05/10/24 0327 05/13/24 1027  NA 139 137 137 136  --   K 3.8 4.0 3.9 3.7  --   CL 99 101 100 102  --   CO2 27 27 28 26   --   GLUCOSE 104* 104* 141* 109*  --   BUN 9 9 9 10   --   CREATININE 0.84 1.01* 1.02* 1.14* 0.82  CALCIUM 8.4* 8.3* 8.5* 8.3*  --    GFR: Estimated Creatinine Clearance:  45.9 mL/min (by C-G formula based on SCr of 0.82 mg/dL). Liver Function Tests: Recent Labs  Lab 05/08/24 0536 05/09/24 0443 05/10/24 0327  AST 16 15 12*  ALT 8 9 8   ALKPHOS 55 54 48  BILITOT 0.3 0.4 0.4  PROT 7.6 7.4 7.2  ALBUMIN 2.0* 2.0* 2.0*   No results for input(s): LIPASE, AMYLASE in the last 168 hours. No results for input(s): AMMONIA in the last 168 hours. Coagulation Profile: No results for input(s): INR, PROTIME in the last 168 hours. Cardiac Enzymes: No results for input(s): CKTOTAL, CKMB, CKMBINDEX, TROPONINI in the last 168 hours. ProBNP, BNP (last 5 results) No results for input(s): PROBNP, BNP in the last 8760 hours. HbA1C: No results for input(s): HGBA1C in the last 72 hours. CBG: No results for input(s): GLUCAP in the last 168 hours. Lipid Profile: No results for input(s): CHOL, HDL, LDLCALC, TRIG, CHOLHDL, LDLDIRECT in the last 72 hours. Thyroid Function Tests: No results for input(s): TSH, T4TOTAL, FREET4, T3FREE, THYROIDAB in the last 72 hours. Anemia Panel: No results for input(s): VITAMINB12, FOLATE, FERRITIN, TIBC, IRON, RETICCTPCT in the last 72 hours. Sepsis Labs: No results for input(s): PROCALCITON, LATICACIDVEN in the last 168 hours.  No results found for this or any previous visit (from the past 240 hours).   Radiology Studies: No results found.  Scheduled Meds:  ARIPiprazole   2 mg Oral Daily   enoxaparin  (LOVENOX ) injection  40 mg Subcutaneous Q24H   Gerhardt's butt cream   Topical BID   multivitamin with minerals  1 tablet Oral Daily   sodium chloride  flush  3 mL Intravenous Q12H   Continuous Infusions:   LOS: 6 days   Carolyn Bar, MD  [facility]  05/14/2024, 10:58 AM

## 2024-05-14 NOTE — Plan of Care (Signed)

## 2024-05-14 NOTE — Plan of Care (Signed)
   Problem: Education: Goal: Knowledge of General Education information will improve Description: Including pain rating scale, medication(s)/side effects and non-pharmacologic comfort measures Outcome: Progressing   Problem: Clinical Measurements: Goal: Will remain free from infection Outcome: Progressing Goal: Diagnostic test results will improve Outcome: Progressing
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
## 2024-05-15 DIAGNOSIS — F209 Schizophrenia, unspecified: Secondary | ICD-10-CM

## 2024-05-15 DIAGNOSIS — S31104A Unspecified open wound of abdominal wall, left lower quadrant without penetration into peritoneal cavity, initial encounter: Secondary | ICD-10-CM

## 2024-05-15 DIAGNOSIS — S31103A Unspecified open wound of abdominal wall, right lower quadrant without penetration into peritoneal cavity, initial encounter: Secondary | ICD-10-CM

## 2024-05-15 DIAGNOSIS — K603 Anal fistula, unspecified: Secondary | ICD-10-CM

## 2024-05-15 DIAGNOSIS — F0390 Unspecified dementia without behavioral disturbance: Secondary | ICD-10-CM

## 2024-05-15 NOTE — Plan of Care (Signed)
   Problem: Education: Goal: Knowledge of General Education information will improve Description: Including pain rating scale, medication(s)/side effects and non-pharmacologic comfort measures Outcome: Progressing   Problem: Health Behavior/Discharge Planning: Goal: Ability to manage health-related needs will improve Outcome: Progressing   Problem: Clinical Measurements: Goal: Diagnostic test results will improve Outcome: Progressing

## 2024-05-15 NOTE — Progress Notes (Signed)
 PROGRESS NOTE    Carolyn Huynh  FMW:[id number] DOB: 1955/07/21 DOA: 05/03/2024 PCP: Patient, [name]  Subjective:  No acute events overnight. Seen and examined at bedside. No new complaints. Tolerating oral intake without n/v. Denies constipation.   Hospital Course: 68 year old female with longstanding schizophrenia, delusional disorder, EtOH abuse with psychiatric hospitalization in 2023, pneumothorax with surgery to left chest in 2000 complicated by spontaneous pneumothoraces managed by Dr. Vantrigt-most recent admission for this 2019 undergoing a right VATS with stapling of blebs and mechanical pleurodesis. Brought to ED from home with daughter increasing weakness red water stools. Evaluation in the ED no obvious hemorrhoids had bright red blood per rectum. FOBT was positive    Assessment and Plan:  Perianal fistula Buttock abscess No surgical intervention as per gynecologic surgery Finished 5 days of antibiotics course on 11/28 Continue with local wound care as per wound care team recommendations Will need to follow up with surgery outpatient for perianal fistula closure   Stercoral colitis Resolved with bowel regimen Monitor for regular BMs   Renal cyst Noted on CT abdomen/pelvis to be 2.9cm Will need outpatient workup   Schizophrenia Patient lacks capacity for medication decision making Cont Abilify  Psych following Will need to follow up with psychiatry outpatient   Moderate Alzheimer's dementia Monitor clinically   Bright red blood per rectum Repeat FOBT 11/20 negative GI signed off as patient refused colonoscopy and will not drink preparation Monitor clinically  DVT prophylaxis: enoxaparin  (LOVENOX ) injection 40 mg Start: 05/13/24 1100 SCDs Start: 05/13/24 1011  Lovenox    Code Status: Full Code Disposition Plan: TBD, likely SNF Reason for continuing need for hospitalization: medically ready, placement pending, SW following   Objective: Vitals:    05/14/24 2031 05/15/24 0027 05/15/24 0349 05/15/24 0754  BP: 125/81 134/84 136/88 (!) 151/85  Pulse: 80 71 74 77  Resp: 20 20 16 18   Temp: 98.1 F (36.7 C) 98 F (36.7 C) 98 F (36.7 C) 97.9 F (36.6 C)  TempSrc: Oral Oral  Oral  SpO2: 95% 96% 95% 96%  Weight:      Height:       No intake or output data in the 24 hours ending 05/15/24 1107 Filed Weights   05/05/24 1522 05/11/24 1857  Weight: 47.2 kg 47.2 kg    Examination:  Physical Exam Vitals and nursing note reviewed.  Constitutional:      General: She is not in acute distress.    Appearance: She is ill-appearing.     Comments: Weak, frail, cachectic  HENT:     Head: Normocephalic and atraumatic.  Cardiovascular:     Rate and Rhythm: Normal rate and regular rhythm.     Pulses: Normal pulses.     Heart sounds: Normal heart sounds.  Pulmonary:     Effort: Pulmonary effort is normal.     Breath sounds: Normal breath sounds.  Abdominal:     General: Bowel sounds are normal.     Palpations: Abdomen is soft.  Neurological:     Mental Status: She is alert.     Data Reviewed: I have personally reviewed following labs and imaging studies  CBC: Recent Labs  Lab 05/09/24 0443 05/10/24 0327 05/13/24 1027  WBC 6.4 7.1 7.2  NEUTROABS 4.0 4.7  --   HGB 11.7* 11.1* 12.2  HCT 36.6 34.6* 38.2  MCV 90.6 89.4 90.7  PLT 362 380 429*   Basic Metabolic Panel: Recent Labs  Lab 05/09/24 0443 05/10/24 0327 05/13/24 1027  NA  137 136  --   K 3.9 3.7  --   CL 100 102  --   CO2 28 26  --   GLUCOSE 141* 109*  --   BUN 9 10  --   CREATININE 1.02* 1.14* 0.82  CALCIUM 8.5* 8.3*  --    GFR: Estimated Creatinine Clearance: 45.9 mL/min (by C-G formula based on SCr of 0.82 mg/dL). Liver Function Tests: Recent Labs  Lab 05/09/24 0443 05/10/24 0327  AST 15 12*  ALT 9 8  ALKPHOS 54 48  BILITOT 0.4 0.4  PROT 7.4 7.2  ALBUMIN 2.0* 2.0*   No results for input(s): LIPASE, AMYLASE in the last 168 hours. No results  for input(s): AMMONIA in the last 168 hours. Coagulation Profile: No results for input(s): INR, PROTIME in the last 168 hours. Cardiac Enzymes: No results for input(s): CKTOTAL, CKMB, CKMBINDEX, TROPONINI in the last 168 hours. ProBNP, BNP (last 5 results) No results for input(s): PROBNP, BNP in the last 8760 hours. HbA1C: No results for input(s): HGBA1C in the last 72 hours. CBG: No results for input(s): GLUCAP in the last 168 hours. Lipid Profile: No results for input(s): CHOL, HDL, LDLCALC, TRIG, CHOLHDL, LDLDIRECT in the last 72 hours. Thyroid Function Tests: No results for input(s): TSH, T4TOTAL, FREET4, T3FREE, THYROIDAB in the last 72 hours. Anemia Panel: No results for input(s): VITAMINB12, FOLATE, FERRITIN, TIBC, IRON, RETICCTPCT in the last 72 hours. Sepsis Labs: No results for input(s): PROCALCITON, LATICACIDVEN in the last 168 hours.  No results found for this or any previous visit (from the past 240 hours).   Radiology Studies: No results found.  Scheduled Meds:  ARIPiprazole   2 mg Oral Daily   enoxaparin  (LOVENOX ) injection  40 mg Subcutaneous Q24H   feeding supplement  237 mL Oral BID BM   Gerhardt's butt cream   Topical BID   multivitamin with minerals  1 tablet Oral Daily   sodium chloride  flush  3 mL Intravenous Q12H   Continuous Infusions:   LOS: 7 days   Norval Bar, MD  [facility]  05/15/2024, 11:07 AM

## 2024-05-15 NOTE — Plan of Care (Signed)

## 2024-05-15 NOTE — Progress Notes (Signed)
 Daily Progress Note   [name]       Date: 05/15/2024 DOB: 10-21-1955  Age: 68 y.o. MRN#: [id number] Attending Physician: Cosette Blackwater, MD Primary Care Physician: Patient, [name] Admit Date: 05/03/2024  Reason for Consultation/Follow-up: Establishing goals of care  Patient profile: 68 y.o. female   admitted on 05/03/2024 with past medical history significant for hypertension, dementia, bipolar disorder, schizophrenia, recurrent pneumothorax, alcohol use/long remission, diverticulosis presenting with weakness and watery red stool.  Today is day 12 of this hospitalization.  Multiple comorbidities; large stool burden on CT, perianal fistula, bilateral groin wounds and breakdown complicated by patient's underlying psychiatric disorders.    Patient lacks capacity   Decisions regarding treatment options decision, advanced directive decisions and anticipatory care needs pending.   Patient followed closely by psychiatry.  Subjective:  Patient is alert, oriented only to self.  Patient is pleasantly delusional,  patient does not have medical decision-making capacity at this time.  No family at bedside.  TOC reported the daughter would be at the bedside today.  Attempted to connect with family multiple times but no family at bedside on several  attempts   Length of Stay: 7  Current Medications: Scheduled Meds:   ARIPiprazole   2 mg Oral Daily   enoxaparin  (LOVENOX ) injection  40 mg Subcutaneous Q24H   feeding supplement  237 mL Oral BID BM   Gerhardt's butt cream   Topical BID   multivitamin with minerals  1 tablet Oral Daily   sodium chloride  flush  3 mL Intravenous Q12H    Continuous Infusions:    PRN Meds: acetaminophen  **OR** acetaminophen   Physical  Exam Cardiovascular:     Rate and Rhythm: Normal rate.  Pulmonary:     Effort: Pulmonary effort is normal.  Skin:    General: Skin is warm and dry.  Neurological:     Mental Status: She is alert.  Psychiatric:        Thought Content: Thought content is delusional.        Cognition and Memory: Cognition is impaired.        Judgment: Judgment is inappropriate.             Vital Signs: BP (!) 143/90 (BP Location: Right Arm)   Pulse 88   Temp 98.1 F (36.7 C) (  Oral)   Resp 18   Ht 5' (1.524 m)   Wt 47.2 kg   SpO2 91%   BMI 20.32 kg/m  SpO2: SpO2: 91 % O2 Device: O2 Device: Room Air O2 Flow Rate:    Intake/output summary: No intake or output data in the 24 hours ending 05/15/24 1233  LBM: Last BM Date : 05/12/24 Baseline Weight: Weight: 47.2 kg Most recent weight: Weight: 47.2 kg       Palliative Assessment/Data:  50 %      Patient Active Problem List   Diagnosis Date Noted   Protein-calorie malnutrition, severe 05/06/2024   Constipation 05/04/2024   Heme positive stool 05/04/2024   Abdominal cramping 05/04/2024   Hypertension 05/03/2024   Acute GI bleeding 05/03/2024   Acute diverticulitis 12/12/2023   Diverticulitis 12/12/2023   Vascular dementia with delusions (HCC) 09/18/2021   Severe manic bipolar 1 disorder with psychotic behavior (HCC) 09/09/2021   Schizophrenia, paranoid type (HCC) 09/08/2021   History of alcohol use disorder 09/08/2021   Mixed Alzheimer's and vascular dementia (HCC) 09/08/2021   Severe alcohol use disorder, in sustained remission (HCC) 09/03/2019   Pneumothorax, right 02/01/2018   Recurrent spontaneous pneumothorax 01/31/2018   Pneumothorax on right 01/22/2018   Pneumothorax 01/15/2018    Palliative Care Assessment & Plan     Assessment:  Perianal fistula medical management for now no surgical intervention recommended, bilateral groin wounds/wound care consulted, schizophrenia and delusional disorder, patient lacks capacity,  dementia, severe protein calorie malnutrition.  No family at bedside on multiple attempts.  Discussed in detail with TOC/ Carolyn Huynh and Dr. Cosette  Family continue to verbalize their desire for all available and offered medical interventions to prolong life.  Family goal is to pursue guardianship.  TOC conversation with family  Recommendations/Plan: Full medical support to treat treatable. Continue psychiatry recommendations Social work to assist with guardianship and disposition PMT will sign off at this time as discussed with attending.  If future needs arise please reconsult  Goals of Care and Additional Recommendations: Limitations on Scope of Treatment: Full Scope Treatment  Code Status:    Code Status Orders  (From admission, onward)           Start     Ordered   05/03/24 1514  Full code  Continuous       Question:  By:  Answer:  Consent: discussion documented in EHR   05/03/24 1517           Code Status History     Date Active Date Inactive Code Status Order ID Comments User Context   12/12/2023 0743 12/17/2023 1906 Full Code [id number]  Georgina Basket, MD ED   09/09/2021 1721 09/29/2021 2136 Full Code [id number]  Cam Charlie Loving, DO Inpatient   09/09/2021 1721 09/09/2021 1721 Full Code [id number]  Cam Charlie Loving, DO Inpatient   09/05/2021 1810 09/09/2021 1715 Full Code [id number]  Primus Larraine SAILOR, PA-C ED   08/14/2019 1949 08/18/2019 0126 Full Code [id number]  Darra Fonda MATSU, MD ED   02/01/2018 1632 02/06/2018 1510 Full Code [id number]  Dwan Kyla HERO, PA-C Inpatient   01/22/2018 0825 01/29/2018 1719 Full Code [id number]  Fleeta Hanford Coy, MD ED   01/15/2018 1101 01/17/2018 1509 Full Code [id number]  Lucien Orren SAILOR, PA-C ED       Prognosis:  Unable to determine  Discharge Planning: To Be Determined  Care plan was discussed with Dr. Cosette  and  [facility] and bedside RN   25 minutes  Thank  you for allowing the Palliative Medicine Team to assist in the care of  this patient.   Ronal Plants, NP  Please contact Palliative Medicine Team phone at [phone] for questions and concerns.

## 2024-05-16 MED ORDER — JUVEN PO PACK
1.0000 | PACK | Freq: Two times a day (BID) | ORAL | Status: DC
  Administered 2024-05-17 – 2024-05-27 (×2): 1 via ORAL
  Filled 2024-05-16 (×11): qty 1

## 2024-05-16 NOTE — Progress Notes (Addendum)
 PROGRESS NOTE    Carolyn Huynh  FMW:[id number] DOB: 1956-03-07 DOA: 05/03/2024 PCP: Patient, [name]  Subjective:  No acute events overnight. Seen and examined at bedside. No new complaints. Tolerating oral intake without n/v. Denies constipation   Hospital Course: 68 year old female with longstanding schizophrenia, delusional disorder, EtOH abuse with psychiatric hospitalization in 2023, pneumothorax with surgery to left chest in 2000 complicated by spontaneous pneumothoraces managed by Dr. Vantrigt-most recent admission for this 2019 undergoing a right VATS with stapling of blebs and mechanical pleurodesis. Brought to ED from home with daughter increasing weakness red water stools. Evaluation in the ED no obvious hemorrhoids had bright red blood per rectum. FOBT was positive. Found to have perianal fistula as likely bleeding source with surrounding soft tissue cellulitis that was treated with antibiotics and local wound care. Seen by gynecologic surgery with no plan for surgical intervention. Patient was seen by GI and refused colonoscopy. Hospital course complicated by stercoral colitis that resolved with bowel regimen, and shizophrenia that was managed with medication adjustments by psychiatry team.      Assessment and Plan:  Perianal fistula Buttock abscess No surgical intervention as per gynecologic surgery Finished 5 days of antibiotics course on 11/28 Continue with local wound care as per wound care team recommendations Will need to follow up with surgery outpatient for perianal fistula closure   Stercoral colitis Resolved with bowel regimen Monitor for regular BMs   Renal cyst Noted on CT abdomen/pelvis to be 2.9cm Will need outpatient workup   Schizophrenia Patient lacks capacity for medication decision making Cont Abilify  Psych following Will need to follow up with psychiatry outpatient   Moderate Alzheimer's dementia Monitor clinically   Bright red blood per  rectum Repeat FOBT 11/20 negative GI signed off as patient refused colonoscopy and will not drink preparation Monitor clinically  DVT prophylaxis: enoxaparin  (LOVENOX ) injection 40 mg Start: 05/13/24 1100 SCDs Start: 05/13/24 1011  Lovenox    Code Status: Full Code Disposition Plan: TBD, likely SNF Reason for continuing need for hospitalization:  medically ready, placement pending, SW following   Objective: Vitals:   05/15/24 1659 05/15/24 1941 05/16/24 0433 05/16/24 0756  BP: (!) 152/84 122/82 131/86 138/71  Pulse: 83 83 77 79  Resp: 18 20 17    Temp:  98.2 F (36.8 C) 98 F (36.7 C) 97.9 F (36.6 C)  TempSrc:  Oral Oral   SpO2: 95% 97% 94% 95%  Weight:      Height:        Intake/Output Summary (Last 24 hours) at 05/16/2024 1116 Last data filed at 05/16/2024 0720 Gross per 24 hour  Intake 500 ml  Output --  Net 500 ml   Filed Weights   05/05/24 1522 05/11/24 1857  Weight: 47.2 kg 47.2 kg    Examination:  Physical Exam Vitals and nursing note reviewed.  Constitutional:      General: She is not in acute distress.    Appearance: She is not ill-appearing.  HENT:     Head: Normocephalic and atraumatic.  Cardiovascular:     Rate and Rhythm: Normal rate and regular rhythm.     Pulses: Normal pulses.     Heart sounds: Normal heart sounds.  Neurological:     Mental Status: She is alert.     Data Reviewed: I have personally reviewed following labs and imaging studies  CBC: Recent Labs  Lab 05/10/24 0327 05/13/24 1027  WBC 7.1 7.2  NEUTROABS 4.7  --   HGB 11.1* 12.2  HCT 34.6* 38.2  MCV 89.4 90.7  PLT 380 429*   Basic Metabolic Panel: Recent Labs  Lab 05/10/24 0327 05/13/24 1027  NA 136  --   K 3.7  --   CL 102  --   CO2 26  --   GLUCOSE 109*  --   BUN 10  --   CREATININE 1.14* 0.82  CALCIUM 8.3*  --    GFR: Estimated Creatinine Clearance: 45.9 mL/min (by C-G formula based on SCr of 0.82 mg/dL). Liver Function Tests: Recent Labs  Lab  05/10/24 0327  AST 12*  ALT 8  ALKPHOS 48  BILITOT 0.4  PROT 7.2  ALBUMIN 2.0*   No results for input(s): LIPASE, AMYLASE in the last 168 hours. No results for input(s): AMMONIA in the last 168 hours. Coagulation Profile: No results for input(s): INR, PROTIME in the last 168 hours. Cardiac Enzymes: No results for input(s): CKTOTAL, CKMB, CKMBINDEX, TROPONINI in the last 168 hours. ProBNP, BNP (last 5 results) No results for input(s): PROBNP, BNP in the last 8760 hours. HbA1C: No results for input(s): HGBA1C in the last 72 hours. CBG: No results for input(s): GLUCAP in the last 168 hours. Lipid Profile: No results for input(s): CHOL, HDL, LDLCALC, TRIG, CHOLHDL, LDLDIRECT in the last 72 hours. Thyroid Function Tests: No results for input(s): TSH, T4TOTAL, FREET4, T3FREE, THYROIDAB in the last 72 hours. Anemia Panel: No results for input(s): VITAMINB12, FOLATE, FERRITIN, TIBC, IRON, RETICCTPCT in the last 72 hours. Sepsis Labs: No results for input(s): PROCALCITON, LATICACIDVEN in the last 168 hours.  No results found for this or any previous visit (from the past 240 hours).   Radiology Studies: No results found.  Scheduled Meds:  ARIPiprazole   2 mg Oral Daily   enoxaparin  (LOVENOX ) injection  40 mg Subcutaneous Q24H   feeding supplement  237 mL Oral BID BM   Gerhardt's butt cream   Topical BID   multivitamin with minerals  1 tablet Oral Daily   sodium chloride  flush  3 mL Intravenous Q12H   Continuous Infusions:   LOS: 8 days   Norval Bar, MD  [facility]  05/16/2024, 11:16 AM

## 2024-05-16 NOTE — Progress Notes (Signed)
 Nutrition Follow-up  DOCUMENTATION CODES:  Underweight, Severe malnutrition in context of social or environmental circumstances  INTERVENTION:  1 packet Juven BID to support wound healing. Each packet provides 95 calories, 2.5 grams of protein (collagen), and 9.8 grams of carbohydrate (3 grams sugar); also contains 7 grams of L-arginine and L-glutamine, 300 mg vitamin C, 15 mg vitamin E, 1.2 mcg vitamin B-12, 9.5 mg zinc, 200 mg calcium, and 1.5 g Calcium Beta-hydroxy-Beta-methylbutyrate. Double protein allowance for each meal. Weigh patient - communicated to RN. Continue Ensure Plus High Protein PO BID. Patient encouraged to request for more if desired. Each supplement provides 350 Kcals and 20 grams of protein.  Continue Multivitamin PO once daily. Continue regular diet.  NUTRITION DIAGNOSIS:  Severe Malnutrition related to social / environmental circumstances as evidenced by energy intake < 75% for > or equal to 3 months, severe muscle depletion, severe fat depletion - remains applicable  GOAL:  Patient will meet greater than or equal to 90% of their needs - ongoing  MONITOR:  PO intake, Supplement acceptance, Weight trends  REASON FOR ASSESSMENT:  Malnutrition Screening Tool    ASSESSMENT:  Patient presented with bright red blood per rectum and was found to have perianal fistula, buttock abscess, and stercoral colitis. PMH significant for Alzheimer's dementia, schizophrenia, delusional disorder, ETOH abuse, psychiatric hospitalization 2023, pneumothorax, VATS 2019, COPD, and HTN.  11/19 admitted 11/21 psych consult = patient lacks capacity for medical decision making; patient reported to RD that her PO intake has varied the past few months with 3-meal days and days where she would skip 1-2 meals. 11/25 wound care consult for new buttock wound, deemed chronic stage 4 PI, not present on arrival 12/01 palliative signed off  Update: Visited the patient who tells me her appetite is  better since admission but she is still eating well only intermittently. She is drinking two Ensures daily and I encouraged her to ask for more if she would like. We discussed Juven for wound healing and I encouraged the patient to increase her protein intake. Educated her on protein choices since she thought vegetables were protein.   Scheduled Meds:  ARIPiprazole   2 mg Oral Daily   enoxaparin  (LOVENOX ) injection  40 mg Subcutaneous Q24H   feeding supplement  237 mL Oral BID BM   Gerhardt's butt cream   Topical BID   multivitamin with minerals  1 tablet Oral Daily   sodium chloride  flush  3 mL Intravenous Q12H    Diet Order             Diet regular Room service appropriate? Yes; Fluid consistency: Thin  Diet effective now                  Meal Intake: 25-90%  Labs:     Latest Ref Rng & Units 05/13/2024   10:27 AM 05/10/2024    3:27 AM 05/09/2024    4:43 AM  CMP  Glucose 70 - 99 mg/dL  890  858   BUN 8 - 23 mg/dL  10  9   Creatinine 9.55 - 1.00 mg/dL 9.17  8.85  8.97   Sodium 135 - 145 mmol/L  136  137   Potassium 3.5 - 5.1 mmol/L  3.7  3.9   Chloride 98 - 111 mmol/L  102  100   CO2 22 - 32 mmol/L  26  28   Calcium 8.9 - 10.3 mg/dL  8.3  8.5   Total Protein 6.5 - 8.1 g/dL  7.2  7.4   Total Bilirubin 0.0 - 1.2 mg/dL  0.4  0.4   Alkaline Phos 38 - 126 U/L  48  54   AST 15 - 41 U/L  12  15   ALT 0 - 44 U/L  8  9    I/O: +2 L since admit  NUTRITION - FOCUSED PHYSICAL EXAM: Flowsheet Row Most Recent Value  Orbital Region Severe depletion  Upper Arm Region Severe depletion  Thoracic and Lumbar Region Severe depletion  Buccal Region Severe depletion  Temple Region Severe depletion  Clavicle Bone Region Severe depletion  Clavicle and Acromion Bone Region Severe depletion  Scapular Bone Region Severe depletion  Dorsal Hand Severe depletion  Patellar Region Severe depletion  Anterior Thigh Region Severe depletion  Posterior Calf Region Severe depletion  Hair  Reviewed  Eyes Reviewed  Mouth Reviewed  Skin Reviewed  Nails Reviewed    EDUCATION NEEDS:  Education needs have been addressed  Skin:  Skin Assessment: Skin Integrity Issues: Skin Integrity Issues:: Stage IV Stage IV: buttocks  Last BM:  12/2 type 4  Height:  Ht Readings from Last 1 Encounters:  05/11/24 5' (1.524 m)   Weight:    Weight Change: 3 Kg (6%) loss in 6 months - not clinically significant  Usual Body Weight: 110 lbs per patient  Edema: none  Ideal Body Weight:  45 kg   BMI:  Body mass index is 20.32 kg/m.  Estimated Nutritional Needs:  Kcal:  1500-1800 Protein:  100-110 Fluid:  >1500    Carolyn Ruth, MS, RDN, LDN [place]. [facility] See AMION for contact information Secure chat preferred
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
## 2024-05-16 NOTE — Progress Notes (Signed)
 Approached patient with intent to do wound care on documented buttocks wound. Patient refused at this time and requested dressing change after lunch. Will follow up later per patient request.

## 2024-05-17 NOTE — Plan of Care (Signed)

## 2024-05-17 NOTE — Progress Notes (Signed)
 PROGRESS NOTE    Carolyn Huynh  FMW:[id number] DOB: 1956/01/04 DOA: 05/03/2024 PCP: Patient, [name]    Hospital Course: 68 year old female with longstanding schizophrenia, delusional disorder, EtOH abuse with psychiatric hospitalization in 2023, pneumothorax with surgery to left chest in 2000 complicated by spontaneous pneumothoraces managed by Dr. Vantrigt-most recent admission for this 2019 undergoing a right VATS with stapling of blebs and mechanical pleurodesis. Brought to ED from home with daughter increasing weakness, red water stools. Evaluation in the ED no obvious hemorrhoids had bright red blood per rectum. FOBT was positive. Found to have perianal fistula as likely bleeding source with surrounding soft tissue cellulitis that was treated with antibiotics and local wound care. Seen by gynecologic surgery with no plan for surgical intervention. Patient was seen by GI and refused colonoscopy. Hospital course complicated by stercoral colitis that resolved with bowel regimen, and shizophrenia that is managed with medication adjustments by psychiatry team.     Today, patient denied any new complaints.      Assessment and Plan:  Perianal fistula Buttock abscess No surgical intervention as per gynecologic surgery Finished 5 days of antibiotics course on 11/28 Continue with local wound care as per wound care team recommendations Will need to follow up with surgery outpatient for perianal fistula closure   Stercoral colitis Resolved with bowel regimen Monitor for regular BMs   Renal cyst Noted on CT abdomen/pelvis to be 2.9cm Will need outpatient workup   Schizophrenia Patient lacks capacity for medication decision making Cont Abilify  Psych following Will need to follow up with psychiatry outpatient   Moderate Alzheimer's dementia Delirium precautions   Bright red blood per rectum Repeat FOBT 11/20 negative GI signed off as patient refused colonoscopy and will not  drink bowel prep Monitor clinically  DVT prophylaxis: enoxaparin  (LOVENOX ) injection 40 mg Start: 05/13/24 1100 SCDs Start: 05/13/24 1011  Lovenox    Code Status: Full Code Disposition Plan: TBD, likely SNF Reason for continuing need for hospitalization:  medically ready, placement pending, SW following   Objective: Vitals:   05/17/24 0027 05/17/24 0437 05/17/24 0901 05/17/24 1537  BP: (!) 140/90 128/83 (!) 159/79 132/88  Pulse: 82 76 78 78  Resp: 20 20    Temp: 97.9 F (36.6 C) 98 F (36.7 C) (!) 97.4 F (36.3 C) 98 F (36.7 C)  TempSrc: Oral Oral    SpO2: 97% 97% 96% 97%  Weight:      Height:        Intake/Output Summary (Last 24 hours) at 05/17/2024 1854 Last data filed at 05/17/2024 0500 Gross per 24 hour  Intake 300 ml  Output --  Net 300 ml   Filed Weights   05/05/24 1522 05/11/24 1857 05/16/24 1704  Weight: 47.2 kg 47.2 kg 45.4 kg    Examination:  General: NAD  Cardiovascular: S1, S2 present Respiratory: CTAB Abdomen: Soft, nontender, nondistended, bowel sounds present Musculoskeletal: No bilateral pedal edema noted Skin: Normal Psychiatry: Normal mood  Data Reviewed: I have personally reviewed following labs and imaging studies  CBC: Recent Labs  Lab 05/13/24 1027  WBC 7.2  HGB 12.2  HCT 38.2  MCV 90.7  PLT 429*   Basic Metabolic Panel: Recent Labs  Lab 05/13/24 1027  CREATININE 0.82   GFR: Estimated Creatinine Clearance: 45.8 mL/min (by C-G formula based on SCr of 0.82 mg/dL). Liver Function Tests: No results for input(s): AST, ALT, ALKPHOS, BILITOT, PROT, ALBUMIN in the last 168 hours.  No results for input(s): LIPASE, AMYLASE in the  last 168 hours. No results for input(s): AMMONIA in the last 168 hours. Coagulation Profile: No results for input(s): INR, PROTIME in the last 168 hours. Cardiac Enzymes: No results for input(s): CKTOTAL, CKMB, CKMBINDEX, TROPONINI in the last 168 hours. ProBNP, BNP (last  5 results) No results for input(s): PROBNP, BNP in the last 8760 hours. HbA1C: No results for input(s): HGBA1C in the last 72 hours. CBG: No results for input(s): GLUCAP in the last 168 hours. Lipid Profile: No results for input(s): CHOL, HDL, LDLCALC, TRIG, CHOLHDL, LDLDIRECT in the last 72 hours. Thyroid Function Tests: No results for input(s): TSH, T4TOTAL, FREET4, T3FREE, THYROIDAB in the last 72 hours. Anemia Panel: No results for input(s): VITAMINB12, FOLATE, FERRITIN, TIBC, IRON, RETICCTPCT in the last 72 hours. Sepsis Labs: No results for input(s): PROCALCITON, LATICACIDVEN in the last 168 hours.  No results found for this or any previous visit (from the past 240 hours).   Radiology Studies: No results found.  Scheduled Meds:  ARIPiprazole   2 mg Oral Daily   enoxaparin  (LOVENOX ) injection  40 mg Subcutaneous Q24H   feeding supplement  237 mL Oral BID BM   Gerhardt's butt cream   Topical BID   multivitamin with minerals  1 tablet Oral Daily   nutrition supplement (JUVEN)  1 packet Oral BID BM   sodium chloride  flush  3 mL Intravenous Q12H   Continuous Infusions:   LOS: 9 days   [name], MD  [facility]  05/17/2024, 6:54 PM

## 2024-05-17 NOTE — Consult Note (Addendum)
 WOC Nurse Follow-Up: Open wound to right lower buttock, moisture associated dermatitis  Wound type: chronic appearing, presently stage 4, full-thickness skin loss, resistance met when q-tipped applicator is placed into wound and bone is palpated, does appear to show some progress today, patient stated its less sensitive to her.  Pressure Injury POA: No Measurement: 1 x .75 x .75 cm Wound bed: pale pink, open cavity Periwound:scar tissue and induration, darker hue, difficult to assess for erythema due to darkly pigmented skin  Recommend to continue use of Vashe to disinfect and provide moist healing, recommend to continue packing with Vashe saturated gauze to lower bioburden. Updated image taken by primary nurse. Not able to use Iodoform packing material due to allergy.   Dressing procedure/placement/frequency: BID   Cleanse right lower buttock wound with normal saline, pat dry and Vashe solution #[id number] to small gauze packing wound, use as wet to dry dressing, cover with Mepilex or ABD pad with tape. Weekly images of wound. May use clear barrier to buttock for moisture skin breakdown.   05/17/24 Buttock    Patient will remain on weekly follow-up by the WOC. Please reconsult if wound worsens in condition and notify provider.   Sherrilyn Hals MSN RN [facility]OCN [facility]  [phone] (Available from 7-3 pm Mon-Friday)

## 2024-05-18 DIAGNOSIS — F200 Paranoid schizophrenia: Secondary | ICD-10-CM | POA: Diagnosis not present

## 2024-05-18 DIAGNOSIS — J9383 Other pneumothorax: Secondary | ICD-10-CM | POA: Diagnosis not present

## 2024-05-18 DIAGNOSIS — F01B0 Vascular dementia, moderate, without behavioral disturbance, psychotic disturbance, mood disturbance, and anxiety: Secondary | ICD-10-CM | POA: Diagnosis not present

## 2024-05-18 DIAGNOSIS — K922 Gastrointestinal hemorrhage, unspecified: Secondary | ICD-10-CM | POA: Diagnosis not present

## 2024-05-18 LAB — CBC WITH DIFFERENTIAL/PLATELET
Abs Immature Granulocytes: 0.02 K/uL (ref 0.00–0.07)
Basophils Absolute: 0.1 K/uL (ref 0.0–0.1)
Basophils Relative: 1 %
Eosinophils Absolute: 0.3 K/uL (ref 0.0–0.5)
Eosinophils Relative: 5 %
HCT: 34.4 % — ABNORMAL LOW (ref 36.0–46.0)
Hemoglobin: 11.1 g/dL — ABNORMAL LOW (ref 12.0–15.0)
Immature Granulocytes: 0 %
Lymphocytes Relative: 18 %
Lymphs Abs: 1.1 K/uL (ref 0.7–4.0)
MCH: 29.4 pg (ref 26.0–34.0)
MCHC: 32.3 g/dL (ref 30.0–36.0)
MCV: 91 fL (ref 80.0–100.0)
Monocytes Absolute: 0.4 K/uL (ref 0.1–1.0)
Monocytes Relative: 7 %
Neutro Abs: 4.4 K/uL (ref 1.7–7.7)
Neutrophils Relative %: 69 %
Platelets: 403 K/uL — ABNORMAL HIGH (ref 150–400)
RBC: 3.78 MIL/uL — ABNORMAL LOW (ref 3.87–5.11)
RDW: 14.3 % (ref 11.5–15.5)
WBC: 6.4 K/uL (ref 4.0–10.5)
nRBC: 0 % (ref 0.0–0.2)

## 2024-05-18 LAB — BASIC METABOLIC PANEL WITH GFR
Anion gap: 5 (ref 5–15)
BUN: 14 mg/dL (ref 8–23)
CO2: 27 mmol/L (ref 22–32)
Calcium: 8.7 mg/dL — ABNORMAL LOW (ref 8.9–10.3)
Chloride: 106 mmol/L (ref 98–111)
Creatinine, Ser: 0.87 mg/dL (ref 0.44–1.00)
GFR, Estimated: >60 mL/min
Glucose, Bld: 85 mg/dL (ref 70–99)
Potassium: 4.1 mmol/L (ref 3.5–5.1)
Sodium: 138 mmol/L (ref 135–145)

## 2024-05-18 MED ORDER — ARIPIPRAZOLE ER 400 MG IM SRER
200.0000 mg | INTRAMUSCULAR | Status: DC
  Filled 2024-05-18: qty 2

## 2024-05-18 NOTE — Plan of Care (Signed)
   Problem: Clinical Measurements: Goal: Respiratory complications will improve Outcome: Progressing Goal: Cardiovascular complication will be avoided Outcome: Progressing   Problem: Activity: Goal: Risk for activity intolerance will decrease Outcome: Progressing   Problem: Nutrition: Goal: Adequate nutrition will be maintained Outcome: Progressing

## 2024-05-18 NOTE — Progress Notes (Signed)
 PROGRESS NOTE    Carolyn Huynh  FMW:[id number] DOB: September 21, 1955 DOA: 05/03/2024 PCP: Patient, [name]    Hospital Course: 68 year old female with longstanding schizophrenia, delusional disorder, EtOH abuse with psychiatric hospitalization in 2023, pneumothorax with surgery to left chest in 2000 complicated by spontaneous pneumothoraces managed by Dr. Vantrigt-most recent admission for this 2019 undergoing a right VATS with stapling of blebs and mechanical pleurodesis. Brought to ED from home with daughter increasing weakness, red water stools. Evaluation in the ED no obvious hemorrhoids had bright red blood per rectum. FOBT was positive. Found to have perianal fistula as likely bleeding source with surrounding soft tissue cellulitis that was treated with antibiotics and local wound care. Seen by gynecologic surgery with no plan for surgical intervention. Patient was seen by GI and refused colonoscopy. Hospital course complicated by stercoral colitis that resolved with bowel regimen, and shizophrenia that is managed with medication adjustments by psychiatry team.     Today, patient denied any new complaints.  Noted to refuse IM Abilify  today.     Assessment and Plan:  Perianal fistula Buttock abscess No surgical intervention as per gynecologic surgery Finished 5 days of antibiotics course on 11/28 Continue with local wound care as per wound care team recommendations Will need to follow up with surgery outpatient for perianal fistula closure   Stercoral colitis Resolved with bowel regimen Monitor for regular BMs   Renal cyst Noted on CT abdomen/pelvis to be 2.9cm Will need outpatient workup   Schizophrenia Patient lacks capacity for medication decision making Cont Abilify  Psych following Will need to follow up with psychiatry outpatient   Moderate Alzheimer's dementia Delirium precautions   Bright red blood per rectum Repeat FOBT 11/20 negative GI signed off as patient  refused colonoscopy and will not drink bowel prep Monitor clinically  DVT prophylaxis: enoxaparin  (LOVENOX ) injection 40 mg Start: 05/13/24 1100 SCDs Start: 05/13/24 1011  Lovenox    Code Status: Full Code Disposition Plan: TBD, likely SNF Reason for continuing need for hospitalization:  medically ready, placement pending, SW following   Objective: Vitals:   05/18/24 0802 05/18/24 1209 05/18/24 1218 05/18/24 1622  BP: 123/79 (!) 144/83 (!) 140/80 (!) 147/90  Pulse: 77 80 82 87  Resp: 18 18 18 18   Temp: (!) 97 F (36.1 C) 98.3 F (36.8 C) 98 F (36.7 C) 98 F (36.7 C)  TempSrc: Oral Oral Oral Oral  SpO2: 100% 97% 93% 94%  Weight:      Height:        Intake/Output Summary (Last 24 hours) at 05/18/2024 1843 Last data filed at 05/18/2024 0816 Gross per 24 hour  Intake 360 ml  Output --  Net 360 ml   Filed Weights   05/05/24 1522 05/11/24 1857 05/16/24 1704  Weight: 47.2 kg 47.2 kg 45.4 kg    Examination:  General: NAD  Cardiovascular: S1, S2 present Respiratory: CTAB Abdomen: Soft, nontender, nondistended, bowel sounds present Musculoskeletal: No bilateral pedal edema noted Skin: Normal Psychiatry: Normal mood  Data Reviewed: I have personally reviewed following labs and imaging studies  CBC: Recent Labs  Lab 05/13/24 1027 05/18/24 0434  WBC 7.2 6.4  NEUTROABS  --  4.4  HGB 12.2 11.1*  HCT 38.2 34.4*  MCV 90.7 91.0  PLT 429* 403*   Basic Metabolic Panel: Recent Labs  Lab 05/13/24 1027 05/18/24 0434  NA  --  138  K  --  4.1  CL  --  106  CO2  --  27  GLUCOSE  --  85  BUN  --  14  CREATININE 0.82 0.87  CALCIUM  --  8.7*   GFR: Estimated Creatinine Clearance: 43.1 mL/min (by C-G formula based on SCr of 0.87 mg/dL). Liver Function Tests: No results for input(s): AST, ALT, ALKPHOS, BILITOT, PROT, ALBUMIN in the last 168 hours.  No results for input(s): LIPASE, AMYLASE in the last 168 hours. No results for input(s): AMMONIA in  the last 168 hours. Coagulation Profile: No results for input(s): INR, PROTIME in the last 168 hours. Cardiac Enzymes: No results for input(s): CKTOTAL, CKMB, CKMBINDEX, TROPONINI in the last 168 hours. ProBNP, BNP (last 5 results) No results for input(s): PROBNP, BNP in the last 8760 hours. HbA1C: No results for input(s): HGBA1C in the last 72 hours. CBG: No results for input(s): GLUCAP in the last 168 hours. Lipid Profile: No results for input(s): CHOL, HDL, LDLCALC, TRIG, CHOLHDL, LDLDIRECT in the last 72 hours. Thyroid Function Tests: No results for input(s): TSH, T4TOTAL, FREET4, T3FREE, THYROIDAB in the last 72 hours. Anemia Panel: No results for input(s): VITAMINB12, FOLATE, FERRITIN, TIBC, IRON, RETICCTPCT in the last 72 hours. Sepsis Labs: No results for input(s): PROCALCITON, LATICACIDVEN in the last 168 hours.  No results found for this or any previous visit (from the past 240 hours).   Radiology Studies: No results found.  Scheduled Meds:  ARIPiprazole   2 mg Oral Daily   ARIPiprazole  ER  200 mg Intramuscular Q28 days   enoxaparin  (LOVENOX ) injection  40 mg Subcutaneous Q24H   feeding supplement  237 mL Oral BID BM   Gerhardt's butt cream   Topical BID   multivitamin with minerals  1 tablet Oral Daily   nutrition supplement (JUVEN)  1 packet Oral BID BM   sodium chloride  flush  3 mL Intravenous Q12H   Continuous Infusions:   LOS: 10 days   [name], MD  [facility]  05/18/2024, 6:43 PM

## 2024-05-18 NOTE — Progress Notes (Signed)
 Pt refused first dose of IM Abilify . RN educated pt on indication. Pt adamant that she will not accept IM injection. Donnamarie, MD Fayrene, MD and Pharmacist made aware.
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
## 2024-05-19 NOTE — Progress Notes (Signed)
 PROGRESS NOTE    Carolyn Huynh  FMW:[id number] DOB: 09-19-1955 DOA: 05/03/2024 PCP: Patient, [name]    Hospital Course: 68 year old female with longstanding schizophrenia, delusional disorder, EtOH abuse with psychiatric hospitalization in 2023, pneumothorax with surgery to left chest in 2000 complicated by spontaneous pneumothoraces managed by Dr. Vantrigt-most recent admission for this 2019 undergoing a right VATS with stapling of blebs and mechanical pleurodesis. Brought to ED from home with daughter increasing weakness, red water stools. Evaluation in the ED no obvious hemorrhoids had bright red blood per rectum. FOBT was positive. Found to have perianal fistula as likely bleeding source with surrounding soft tissue cellulitis that was treated with antibiotics and local wound care. Seen by gynecologic surgery with no plan for surgical intervention. Patient was seen by GI and refused colonoscopy. Hospital course complicated by stercoral colitis that resolved with bowel regimen, and shizophrenia that is managed with medication adjustments by psychiatry team.     Today, denies any new complaints.     Assessment and Plan:  Perianal fistula Buttock abscess No surgical intervention as per gynecologic surgery Finished 5 days of antibiotics course on 11/28 Continue with local wound care as per wound care team recommendations Will need to follow up with surgery outpatient for perianal fistula closure   Stercoral colitis Resolved with bowel regimen Monitor for regular BMs   Renal cyst Noted on CT abdomen/pelvis to be 2.9cm Will need outpatient workup   Schizophrenia Patient lacks capacity for medication decision making Cont oral Abilify  (refused IM abilify  on 12/4) Psych following Will need to follow up with psychiatry outpatient   Moderate Alzheimer's dementia Delirium precautions   Bright red blood per rectum Repeat FOBT 11/20 negative GI signed off as patient refused  colonoscopy and will not drink bowel prep Monitor clinically  DVT prophylaxis: enoxaparin  (LOVENOX ) injection 40 mg Start: 05/13/24 1100 SCDs Start: 05/13/24 1011  Lovenox    Code Status: Full Code Disposition Plan: TBD, likely SNF Reason for continuing need for hospitalization:  medically ready, placement pending, SW following   Objective: Vitals:   05/19/24 0010 05/19/24 0442 05/19/24 0814 05/19/24 1217  BP: (!) 150/87 138/89 128/73 (!) 135/91  Pulse: 80 73 76 81  Resp: 19 18    Temp: 98.4 F (36.9 C) 98.2 F (36.8 C) 97.6 F (36.4 C) 98 F (36.7 C)  TempSrc: Oral Oral Oral Oral  SpO2: 93% 100% 97% 98%  Weight:      Height:        Intake/Output Summary (Last 24 hours) at 05/19/2024 1414 Last data filed at 05/18/2024 2249 Gross per 24 hour  Intake 3 ml  Output --  Net 3 ml   Filed Weights   05/05/24 1522 05/11/24 1857 05/16/24 1704  Weight: 47.2 kg 47.2 kg 45.4 kg    Examination:  General: NAD  Cardiovascular: S1, S2 present Respiratory: CTAB Abdomen: Soft, nontender, nondistended, bowel sounds present Musculoskeletal: No bilateral pedal edema noted Skin: Normal Psychiatry: Normal mood  Data Reviewed: I have personally reviewed following labs and imaging studies  CBC: Recent Labs  Lab 05/13/24 1027 05/18/24 0434  WBC 7.2 6.4  NEUTROABS  --  4.4  HGB 12.2 11.1*  HCT 38.2 34.4*  MCV 90.7 91.0  PLT 429* 403*   Basic Metabolic Panel: Recent Labs  Lab 05/13/24 1027 05/18/24 0434  NA  --  138  K  --  4.1  CL  --  106  CO2  --  27  GLUCOSE  --  85  BUN  --  14  CREATININE 0.82 0.87  CALCIUM  --  8.7*   GFR: Estimated Creatinine Clearance: 43.1 mL/min (by C-G formula based on SCr of 0.87 mg/dL). Liver Function Tests: No results for input(s): AST, ALT, ALKPHOS, BILITOT, PROT, ALBUMIN in the last 168 hours.  No results for input(s): LIPASE, AMYLASE in the last 168 hours. No results for input(s): AMMONIA in the last 168  hours. Coagulation Profile: No results for input(s): INR, PROTIME in the last 168 hours. Cardiac Enzymes: No results for input(s): CKTOTAL, CKMB, CKMBINDEX, TROPONINI in the last 168 hours. ProBNP, BNP (last 5 results) No results for input(s): PROBNP, BNP in the last 8760 hours. HbA1C: No results for input(s): HGBA1C in the last 72 hours. CBG: No results for input(s): GLUCAP in the last 168 hours. Lipid Profile: No results for input(s): CHOL, HDL, LDLCALC, TRIG, CHOLHDL, LDLDIRECT in the last 72 hours. Thyroid Function Tests: No results for input(s): TSH, T4TOTAL, FREET4, T3FREE, THYROIDAB in the last 72 hours. Anemia Panel: No results for input(s): VITAMINB12, FOLATE, FERRITIN, TIBC, IRON, RETICCTPCT in the last 72 hours. Sepsis Labs: No results for input(s): PROCALCITON, LATICACIDVEN in the last 168 hours.  No results found for this or any previous visit (from the past 240 hours).   Radiology Studies: No results found.  Scheduled Meds:  ARIPiprazole   2 mg Oral Daily   ARIPiprazole  ER  200 mg Intramuscular Q28 days   enoxaparin  (LOVENOX ) injection  40 mg Subcutaneous Q24H   feeding supplement  237 mL Oral BID BM   Gerhardt's butt cream   Topical BID   multivitamin with minerals  1 tablet Oral Daily   nutrition supplement (JUVEN)  1 packet Oral BID BM   sodium chloride  flush  3 mL Intravenous Q12H   Continuous Infusions:   LOS: 11 days   [name], MD  [facility]  05/19/2024, 2:14 PM

## 2024-05-19 NOTE — Plan of Care (Signed)

## 2024-05-20 DIAGNOSIS — F01B0 Vascular dementia, moderate, without behavioral disturbance, psychotic disturbance, mood disturbance, and anxiety: Secondary | ICD-10-CM | POA: Diagnosis not present

## 2024-05-20 DIAGNOSIS — K922 Gastrointestinal hemorrhage, unspecified: Secondary | ICD-10-CM | POA: Diagnosis not present

## 2024-05-20 DIAGNOSIS — J9383 Other pneumothorax: Secondary | ICD-10-CM | POA: Diagnosis not present

## 2024-05-20 DIAGNOSIS — F200 Paranoid schizophrenia: Secondary | ICD-10-CM | POA: Diagnosis not present

## 2024-05-20 LAB — CREATININE, SERUM
Creatinine, Ser: 0.7 mg/dL (ref 0.44–1.00)
GFR, Estimated: >60 mL/min (ref >60)

## 2024-05-20 MED ORDER — ARIPIPRAZOLE 2 MG PO TABS
2.0000 mg | ORAL_TABLET | Freq: Two times a day (BID) | ORAL | Status: AC
  Administered 2024-05-20 – 2024-05-23 (×7): 2 mg via ORAL
  Filled 2024-05-20 (×7): qty 1

## 2024-05-20 MED ORDER — STERILE WATER FOR INJECTION IJ SOLN
INTRAMUSCULAR | Status: AC
  Filled 2024-05-20: qty 10

## 2024-05-20 MED ORDER — OLANZAPINE 10 MG IM SOLR
2.5000 mg | Freq: Two times a day (BID) | INTRAMUSCULAR | Status: AC
  Administered 2024-05-20: 2.5 mg via INTRAMUSCULAR
  Filled 2024-05-20 (×2): qty 10

## 2024-05-20 NOTE — Consult Note (Signed)
 Second Physician Opinion Progress Note for Medication Administration to Non-consenting Patients (For Involuntarily Committed Patients)  Patient: Carolyn Huynh Date of Birth: [id number] MRN: [id number]  Reason for the Medication: The patient, without the benefit of the specific treatment measure, is incapable of participating in any available treatment plan that will give the patient a realistic opportunity of improving the patient's condition.  Consideration of Side Effects: Consideration of the side effects related to the medication plan has been given.  Rationale for Medication Administration: The patient is a 68 year old female with a history of schizoaffective disorder and fixed delusions who is also suffering with vascular dementia.  She has not been compliant with medications and continues to refuse medications getting aggressively more irritable.  The patient currently is too symptomatic to engage in meaningful conversations regarding the treatment of her psychiatric illness and is unlikely to improve without administration of forced medications. Patient is not able to participate in a meaningful way in decisions regarding medications or treatment. At this time I am in agreement that the patient needs to continue an antipsychotic in order to have a realistic expectation for improving her psychosis and agitation. It is my opinion that should the patient refuse medications that this would lead to more risk of potential self-harm than if she was on a protocol for medications against objection.  I agree with  Dr. Donnamarie In this assessment.  [name], MD 05/20/24  12:34 PM   This documentation is good for (7) seven days from the date of the MD signature. New documentation must be completed every seven (7) days with detailed justification in the medical record if the patient requires continued non-emergent administration of psychotropic medications.

## 2024-05-20 NOTE — Plan of Care (Signed)

## 2024-05-20 NOTE — Plan of Care (Signed)
  Problem: Education: Goal: Knowledge of General Education information will improve Description: Including pain rating scale, medication(s)/side effects and non-pharmacologic comfort measures 05/20/2024 1825 by Wilbern Isaiah BRAVO, RN Outcome: Progressing 05/20/2024 1823 by Wilbern Isaiah BRAVO, RN Outcome: Progressing   Problem: Clinical Measurements: Goal: Ability to maintain clinical measurements within normal limits will improve 05/20/2024 1825 by Wilbern Isaiah BRAVO, RN Outcome: Progressing 05/20/2024 1823 by Wilbern Isaiah BRAVO, RN Outcome: Progressing Goal: Will remain free from infection 05/20/2024 1825 by Wilbern Isaiah BRAVO, RN Outcome: Progressing 05/20/2024 1823 by Wilbern Isaiah BRAVO, RN Outcome: Progressing Goal: Diagnostic test results will improve 05/20/2024 1825 by Wilbern Isaiah BRAVO, RN Outcome: Progressing 05/20/2024 1823 by Wilbern Isaiah BRAVO, RN Outcome: Progressing Goal: Respiratory complications will improve 05/20/2024 1825 by Wilbern Isaiah BRAVO, RN Outcome: Progressing 05/20/2024 1823 by Wilbern Isaiah BRAVO, RN Outcome: Progressing Goal: Cardiovascular complication will be avoided 05/20/2024 1825 by Wilbern Isaiah BRAVO, RN Outcome: Progressing 05/20/2024 1823 by Wilbern Isaiah BRAVO, RN Outcome: Progressing   Problem: Activity: Goal: Risk for activity intolerance will decrease 05/20/2024 1825 by Wilbern Isaiah BRAVO, RN Outcome: Progressing 05/20/2024 1823 by Wilbern Isaiah BRAVO, RN Outcome: Progressing   Problem: Nutrition: Goal: Adequate nutrition will be maintained 05/20/2024 1825 by Wilbern Isaiah BRAVO, RN Outcome: Progressing 05/20/2024 1823 by Wilbern Isaiah BRAVO, RN Outcome: Progressing   Problem: Coping: Goal: Level of anxiety will decrease 05/20/2024 1825 by Wilbern Isaiah BRAVO, RN Outcome: Progressing 05/20/2024 1823 by Wilbern Isaiah BRAVO, RN Outcome: Progressing   Problem: Elimination: Goal: Will not experience complications related to bowel  motility 05/20/2024 1825 by Wilbern Isaiah BRAVO, RN Outcome: Progressing 05/20/2024 1823 by Wilbern Isaiah BRAVO, RN Outcome: Progressing Goal: Will not experience complications related to urinary retention 05/20/2024 1825 by Wilbern Isaiah BRAVO, RN Outcome: Progressing 05/20/2024 1823 by Wilbern Isaiah BRAVO, RN Outcome: Progressing   Problem: Pain Managment: Goal: General experience of comfort will improve and/or be controlled 05/20/2024 1825 by Wilbern Isaiah BRAVO, RN Outcome: Progressing 05/20/2024 1823 by Wilbern Isaiah BRAVO, RN Outcome: Progressing   Problem: Safety: Goal: Ability to remain free from injury will improve 05/20/2024 1825 by Wilbern Isaiah BRAVO, RN Outcome: Progressing 05/20/2024 1823 by Wilbern Isaiah BRAVO, RN Outcome: Progressing   Problem: Skin Integrity: Goal: Risk for impaired skin integrity will decrease 05/20/2024 1825 by Wilbern Isaiah BRAVO, RN Outcome: Progressing 05/20/2024 1823 by Wilbern Isaiah BRAVO, RN Outcome: Progressing   Problem: Health Behavior/Discharge Planning: Goal: Ability to manage health-related needs will improve 05/20/2024 1825 by Wilbern Isaiah BRAVO, RN Outcome: Not Progressing 05/20/2024 1823 by Wilbern Isaiah BRAVO, RN Outcome: Progressing

## 2024-05-20 NOTE — Progress Notes (Signed)
 PROGRESS NOTE    Carolyn Huynh  FMW:[id number] DOB: [birth year] DOA: 05/03/2024 PCP: Patient, [name]    Hospital Course: 68 year old female with longstanding schizophrenia, delusional disorder, EtOH abuse with psychiatric hospitalization in 2023, pneumothorax with surgery to left chest in 2000 complicated by spontaneous pneumothoraces managed by Dr. Vantrigt-most recent admission for this 2019 undergoing a right VATS with stapling of blebs and mechanical pleurodesis. Brought to ED from home with daughter increasing weakness, red water  stools. Evaluation in the ED no obvious hemorrhoids had bright red blood per rectum. FOBT was positive. Found to have perianal fistula as likely bleeding source with surrounding soft tissue cellulitis that was treated with antibiotics and local wound care. Seen by gynecologic surgery with no plan for surgical intervention. Patient was seen by GI and refused colonoscopy. Hospital course complicated by stercoral colitis that resolved with bowel regimen, and shizophrenia that is managed with medication adjustments by psychiatry team.    Subjective Today, patient continues to refuse all medications, getting increasingly more agitated, with more frequent hallucinations, stating that medications have been contaminated, reporting that she just had a baby and needs to leave the hospital. The patient denies need for treatment and is unable to verbalize understanding of the risks of noncompliance. Multiple attempts at verbal engagement, education, and offering oral medication alternatives have been unsuccessful.     Assessment and Plan:  Perianal fistula Buttock abscess No surgical intervention as per gynecologic surgery Finished 5 days of antibiotics course on 11/28 Continue with local wound care as per wound care team recommendations Will need to follow up with surgery outpatient for perianal fistula closure   Stercoral colitis Resolved with bowel  regimen Monitor for regular BMs   Renal cyst Noted on CT abdomen/pelvis to be 2.9cm Will need outpatient workup   Schizophrenia Severe psychosis with lack of capacity to make informed decisions regarding treatment. Persistent refusal of medication places the patient at risk for serious medical complications and further psychiatric deterioration On 12/6, I agree with psychiatrist Dr. Larina, to proceed with a nonemergency forced medication order under [place] 122C-57(d[place] to ensure patient safety and promote psychiatric stabilization.  Plan to continue to monitor for side effects, encourage voluntary participation, and reassess decision-making capacity regularly Cont oral Abilify  (if refusing, consider nonemergency forced medication using low-dose Zyprexa  as per psychiatry) Patient refused long-term IM Abilify  Psychiatry following   Moderate Alzheimer's dementia Delirium precautions   Bright red blood per rectum Repeat FOBT 11/20 negative GI signed off as patient refused colonoscopy and will not drink bowel prep Monitor clinically  DVT prophylaxis: enoxaparin  (LOVENOX ) injection 40 mg Start: 05/13/24 1100 SCDs Start: 05/13/24 1011  Lovenox    Code Status: Full Code Disposition Plan: TBD, likely SNF Reason for continuing need for hospitalization:  medically ready, placement pending, SW following   Objective: Vitals:   05/19/24 1959 05/20/24 0001 05/20/24 0730 05/20/24 1202  BP: (!) 142/90 (!) 146/87 (!) 129/94 (!) 149/95  Pulse: 79 77 79 79  Resp: 19 18 16 17   Temp: 98.6 F (37 C) 98.1 F (36.7 C) 98.2 F (36.8 C) 98 F (36.7 C)  TempSrc: Oral Oral Oral Oral  SpO2: 93% 96% 95% 95%  Weight:      Height:        Intake/Output Summary (Last 24 hours) at 05/20/2024 1323 Last data filed at 05/19/2024 2032 Gross per 24 hour  Intake 3 ml  Output --  Net 3 ml   Filed Weights   05/05/24 1522  05/11/24 1857 05/16/24 1704  Weight: 47.2 kg 47.2 kg 45.4 kg     Examination:  General: NAD  Cardiovascular: S1, S2 present Respiratory: CTAB Abdomen: Soft, nontender, nondistended, bowel sounds present Musculoskeletal: No bilateral pedal edema noted Skin: Normal Psychiatry: Normal mood  Data Reviewed: I have personally reviewed following labs and imaging studies  CBC: Recent Labs  Lab 05/18/24 0434  WBC 6.4  NEUTROABS 4.4  HGB 11.1*  HCT 34.4*  MCV 91.0  PLT 403*   Basic Metabolic Panel: Recent Labs  Lab 05/18/24 0434 05/20/24 0559  NA 138  --   K 4.1  --   CL 106  --   CO2 27  --   GLUCOSE 85  --   BUN 14  --   CREATININE 0.87 0.70  CALCIUM 8.7*  --    GFR: Estimated Creatinine Clearance: 46.9 mL/min (by C-G formula based on SCr of 0.7 mg/dL). Liver Function Tests: No results for input(s): AST, ALT, ALKPHOS, BILITOT, PROT, ALBUMIN in the last 168 hours.  No results for input(s): LIPASE, AMYLASE in the last 168 hours. No results for input(s): AMMONIA in the last 168 hours. Coagulation Profile: No results for input(s): INR, PROTIME in the last 168 hours. Cardiac Enzymes: No results for input(s): CKTOTAL, CKMB, CKMBINDEX, TROPONINI in the last 168 hours. ProBNP, BNP (last 5 results) No results for input(s): PROBNP, BNP in the last 8760 hours. HbA1C: No results for input(s): HGBA1C in the last 72 hours. CBG: No results for input(s): GLUCAP in the last 168 hours. Lipid Profile: No results for input(s): CHOL, HDL, LDLCALC, TRIG, CHOLHDL, LDLDIRECT in the last 72 hours. Thyroid Function Tests: No results for input(s): TSH, T4TOTAL, FREET4, T3FREE, THYROIDAB in the last 72 hours. Anemia Panel: No results for input(s): VITAMINB12, FOLATE, FERRITIN, TIBC, IRON, RETICCTPCT in the last 72 hours. Sepsis Labs: No results for input(s): PROCALCITON, LATICACIDVEN in the last 168 hours.  No results found for this or any previous visit (from the past 240  hours).   Radiology Studies: No results found.  Scheduled Meds:  OLANZapine   2.5 mg Intramuscular BID   Or   ARIPiprazole   2 mg Oral BID   ARIPiprazole  ER  200 mg Intramuscular Q28 days   enoxaparin  (LOVENOX ) injection  40 mg Subcutaneous Q24H   feeding supplement  237 mL Oral BID BM   Gerhardt's butt cream   Topical BID   multivitamin with minerals  1 tablet Oral Daily   nutrition supplement (JUVEN)  1 packet Oral BID BM   sodium chloride  flush  3 mL Intravenous Q12H   Continuous Infusions:   LOS: 12 days   [name], MD  [facility]  05/20/2024, 1:23 PM

## 2024-05-20 NOTE — Progress Notes (Signed)
 Wound care and dressing change done per provider's orders. Patient tolerated it well, no c/o received. Will continue to monitor.

## 2024-05-20 NOTE — Consult Note (Signed)
 [facility] Consult Follow-up  Patient Name: .Carolyn Huynh  MRN: [id number]  DOB: 1956/05/08  Consult Order details:  Orders (From admission, onward)     Start     Ordered   05/05/24 1340  IP CONSULT TO PSYCHIATRY       Ordering Provider: Samtani, Jai-Gurmukh, MD  Provider:  (Not yet assigned)  Question Answer Comment  Location [facility]   Reason for Consult? shcizooid--non complait on meds---needs to be seen--read my note pls      05/05/24 1341             Mode of Visit: In person    Psychiatry Consult Evaluation  Service Date: May 20, 2024 LOS:  LOS: 12 days  Chief Complaint the patient is a 68 year old female with a past history of schizoaffective disorder/schizophrenia and a history of mild cognitive impairment referred for medication management.  Primary Psychiatric Diagnoses  Schizophrenia, paranoid type 2.  Delusional disorder 3.  Vascular dementia, moderate  Assessment  Carolyn Huynh is a 68 y.o. female admitted: Medicallyfor 05/03/2024  4:48 AM for increasing weakness and watery stools.. She carries the psychiatric diagnoses of delusional disorder/schizophrenia and has a past medical history of vascular dementia.   The patient's current presentation with delusions, thinking that she is adopted and that she was born in [place] and she was fluent in Chinese is consistent with schizophrenia and delusional disorder.  Patient lacks insight and some of her delusions are quite fixed and have been consistent for many years.  Antipsychotics appear to help her when she is in the hospital but she has been noncompliant. Given her noncompliance we will consider starting her on Invega  and if she improves on this medication with no side effects we will consider Invega  Sustenna for long-term relief. Given her vascular dementia, antipsychotics will be used with caution.  05/20/2024: The patient was seen and reevaluated today.  She is very  inconsistent in her responses.  She has been taking oral medications but now apparently has been refusing oral medications.  She is also refused a long-acting injectable claiming that she does not need any injections.  She does remain confused and still has fixed delusions and hallucinations.  Periodically she gets extremely agitated and without treatment she would probably not improved significantly.  Although she does have vascular dementia, her delusions are significant enough and without treatment would not improve. I discussed with Dr.Ezenduka who is in agreement with [facility].  Full recommendations below.  Diagnoses:  Active Hospital problems: Principal Problem:   Acute GI bleeding Active Problems:   Recurrent spontaneous pneumothorax   Schizophrenia, paranoid type (HCC)   Mixed Alzheimer's and vascular dementia (HCC)   Severe manic bipolar 1 disorder with psychotic behavior (HCC)   Vascular dementia with delusions (HCC)   Hypertension   Severe alcohol use disorder, in sustained remission (HCC)   Constipation   Heme positive stool   Abdominal cramping   Protein-calorie malnutrition, severe    Plan   ## Psychiatric Medication Recommendations:  Continue Abilify  2 mg a day - Abilify  Maintena 200 mg IM every 28 days, patient refused the long-acting injectable. -If patient refuses p.o., would consider nonemergency forced medications using low-dose Zyprexa .  ## Medical Decision Making Capacity: Not specifically addressed in this encounter Patient does not have capacity to refuse medical care at this time.  ## Further Work-up:  -- Per the hospitalist EKG -- most recent EKG on 05/05/2024 had QtCb of 452 -- Pertinent labwork  reviewed earlier this admission includes: As per the hospitalist - Recommend neurology follow-up for vascular dementia and possibly starting on medications if indicated.  ## Disposition:-- Plan Post Discharge/Psychiatric Care Follow-up resources if patient ends  up on long-acting injectable, she will need routine follow-up with psychiatry.  Recommend TOCfollow-up prior to discharge  ## Behavioral / Environmental: -Delirium Precautions: Delirium Interventions for Nursing and Staff: - RN to open blinds every AM. - To Bedside: Glasses, hearing aide, and pt's own shoes. Make available to patients. when possible and encourage use. - Encourage po fluids when appropriate, keep fluids within reach. - OOB to chair with meals. - Passive ROM exercises to all extremities with AM & PM care. - RN to assess orientation to person, time and place QAM and PRN. - Recommend extended visitation hours with familiar family/friends as feasible. - Staff to minimize disturbances at night. Turn off television when pt asleep or when not in use. or Utilize compassion and acknowledge the patient's experiences while setting clear and realistic expectations for care.    ## Safety and Observation Level:  - Based on my clinical evaluation, I estimate the patient to be at low risk of self harm in the current setting. - At this time, we recommend  routine. This decision is based on my review of the chart including patient's history and current presentation, interview of the patient, mental status examination, and consideration of suicide risk including evaluating suicidal ideation, plan, intent, suicidal or self-harm behaviors, risk factors, and protective factors. This judgment is based on our ability to directly address suicide risk, implement suicide prevention strategies, and develop a safety plan while the patient is in the clinical setting. Please contact our team if there is a concern that risk level has changed.  CSSR Risk Category:C-SSRS RISK CATEGORY: No Risk  Suicide Risk Assessment: Patient has following modifiable risk factors for suicide: medication noncompliance and triggering events, which we are addressing by outpatient follow-up appointment and medication management. Patient has  following non-modifiable or demographic risk factors for suicide: psychiatric hospitalization Patient has the following protective factors against suicide: Access to outpatient mental health care and Supportive family  Thank you for this consult request. Recommendations have been communicated to the primary team.  We will continue to follow at this time.   [name], MD       History of Present Illness  Relevant Aspects of [facility] Course:  Admitted on 05/03/2024 for weakness and nausea and vomiting. They the following her for her medical condition including pneumothorax, diverticulosis..   Patient Report:  The patient is a 68 year old female who lives with her daughter in [place]  but is originally from [place] .  Collateral from the family indicates that the patient has a long history of alcoholism and psychosis they remember event when she was young, drinking beer and talking to herself.  She has always been delusional and tends to talk to herself all the time.  She had a few hospitalizations for psychosis in [place]  but was mainly hospitalized for her alcoholism, lung disease and other medical conditions.  She moved to [place]  to stay with one of her daughters.  Patient has 3 daughters.  Apparently she has been hospitalized twice in 2021 and 23 for psychosis but has been noncompliant with medications.  Her last hospitalization 2023 to [facility] and she was placed on Zyprexa  which she promptly stopped after discharge. 05/06/2024: The patient was seen and evaluated.  She is lying in bed and appears fairly emaciated  and dehydrated.  She is oriented to place and person only.  She maintained fair to good eye contact.  Her speech was low volume without any looseness of associations or flight of ideas.  She reports she lives with her daughter in [place]  for the last 5 to 6 years.  She has not started stating that she was a doctor of internal medicine and that  she was born in [place].  When asked if you can speak Chinese she started speaking some gibberish that sounded like Chinese.  Although she did not get agitated she continued to ramble but in general was calm during this interview.  She is not a great historian and was unable to give any details but gave me permission to talk to her daughters.  She denied any active SI/HI/AVH.  She has minimal insight and judgment and at the present time suspect that she lacks capacity to make informed decisions.  Will continue to evaluate her status after initiating a low-dose antipsychotic. 05/07/2024: The patient was seen and reevaluated today.  She is alert oriented and cooperative and in general is fairly pleasant without any agitation or behavioral disturbance.  However she remains delusional and still believes that she is a physician and that she was born and raised in [place].  Other than these fixed delusions patient denies any auditory visual hallucinations.  She denies suicidal ideations.  She has minimal insight and reports that she would prefer to live on her own and stay with her daughter. 05/08/2024: The patient was seen and reevaluated today.  She is alert oriented and cooperative and in general is fairly pleasant without any agitation or behavioral disturbance. Patient remains delusional stating that she is a doctor. She does not have any insight into why she is in the hospital but she has been compliant with treatment and has not required any medications for agitation. The patient states that she does not need any medications for mental health and denies any SI/HI/AVH or concerns that the treatment team is trying to hurt her.  05/09/2024: The patient is seen and evaluated today.  In addition discussed with the team.  The patient is oriented and in general cooperative and fairly pleasant until discussing her treatment plan.  She then becomes very aggressive verbally and claims that she is a clinical research associate and a doctor  and that she was born in [place] and started showing some agitation.  She shows very poor insight and judgment.  However she clearly denies any active SI/HI/AVH. 05/10/2024: The patient was seen and reevaluated.  In general the patient is oriented and cooperative and fairly pleasant.  She is not as verbally aggressive today.  She reports that none of her children visited her.  And examined she is alert oriented and cooperative.  She reports no SI/HI/AVH.  However she remains with fixed delusions and claims that she is a doctor and a lawyer and that next time I come there she is going to show me her diploma.  Other than some of the fixed beliefs, patient appears fairly cooperative. 05/11/2024: The patient was seen and evaluated today.  She is laying in bed and appears to be in no acute distress.  Her speech is coherent without any obvious looseness of association or flight of ideas or tangentiality.  She still maintains the same delusions and continues to insist that she has a doctor and a lawyer but does not appear to be currently answering to herself either today.  Overall she denies active  SI/HI/AVH and is redirectable.  Given her creatinine clearance will change from Invega  to Abilify . 05/12/2024: Patient was seen and reevaluated.  No significant change.  She still has her fixed delusions but has not been a behavioral problem and has not demonstrated any agitation. Today she denies any active SI/HI/AVH. She is now on Abilify  2 mg a day.  In this patient if, she is going to have a SNF facility, long-acting injectable may not be needed since she will be monitored and medications will be provided.  However if she goes home to family, it may be worthwhile investigating LAI.  05/14/2024: No significant change.  She continues to have delusions with no agitation.  Her oral medications continue, compliant, consider a LAI if she returns home.  05/16/2024 Patient continues to be delusional.  No agitation events  noted per chart review.  She has been compliant with her oral medications and will continue.  Still awaiting dispo plan for facility placement versus discharge with daughter.  If discharged with daughter will consider LAI 05/18/2024: Patient is alert oriented cooperative and pleasant.  She maintains good eye contact.  Speech is without any obvious looseness of associations flight of ideas or tangentiality.  She is currently on Abilify  2 mg a day and does not have any active auditory visual hallucinations.  Her delusions are fixed and unchanged.  However she is not agitated.  No active SI/HI/AVH.  Plan is to give her Abilify  maintainna 200 mg IM  today.  05/20/2024: The patient was seen and evaluated and chart was reviewed and the case was discussed with the hospitalist.  Patient continues to refuse all medications and apparently had refused the long-acting injectable.  She is not taking the Abilify  by mouth and is getting more irritable labile and paranoid.  She continues with a fixed delusions but is also more agitated now specially when she is missing a few doses of the antipsychotic.  Although she denies any active SI/HI/AVH, she is a lot more irritable labile and refusing treatments and medications.  Will consider starting a nonemergency forced medications and also contacting the sisters to ensure that they can continue to pursue the guardianship which will allow us  to give her the long-acting injectable.  Psych ROS:  Depression: Denied Anxiety: Denied Mania (lifetime and current): Unknown Psychosis: (lifetime and current): Lifelong history of psychosis and delusions  Collateral information:  Contacted patient's 2 daughters Estefana and [name] at [phone] on 05/06/2024 Both the daughters are good historians and report that they have been taking care of the mother and that she has always been delusional and paranoid which seems to exacerbate when she is drinking.  They have been also trying to get  guardianship but so far they do not have guardianship.  Patient lives with one of her daughters. Her daughters are very supportive and are willing to be involved in discharge planning.   Daughter [name] Cassis [phone] 05/08/2024 She reports that she has attempted to get guardianship over her mother in the past however she does not have the money to pay for a lawyer. She is asking for assistance with this process because she does not think that her mother will stay compliant with her treatment when she leaves the hospital and in the past she has not been forthcoming with her discharge paperwork making it more difficult for the family to help her with treatment.   On 05/12/2024, contacted Estefana Flatter who reports that they are continuing to pursue guardianship.  Her sister who  lives in [place] plans to come and visit the mother but apparently has been having some trouble with her car.   Review of Systems  Psychiatric/Behavioral:  Negative for hallucinations. The patient is not nervous/anxious.        Delusional   All other systems reviewed and are negative.    Psychiatric and Social History  Psychiatric History:  Information collected from medical records and family.  Prev Dx/Sx: Schizophrenia, delusional disorder, vascular dementia Current Psych Provider: Last hospitalized at [facility]. Home Meds (current): Zyprexa , noncompliant Previous Med Trials: Unknown Therapy: Unknown  Prior Psych Hospitalization: At least 2 hospitalizations in [place]  in 2021 at [facility] in 2023 at [facility] Prior Self Harm: None Prior Violence: None  Family Psych History: Unknown Family Hx suicide: Unknown  Social History:  The patient is single, currently living with her daughter in [place] .  Prior to that she was living in [place]  with her other daughter. Access to weapons/lethal means: None  Substance History Extensive history of alcoholism.  Exam Findings  Physical Exam: Per  the hospitalist Vital Signs:  Temp:  [98 F (36.7 C)-98.6 F (37 C)] 98 F (36.7 C) (12/06 1202) Pulse Rate:  [77-84] 79 (12/06 1202) Resp:  [16-19] 17 (12/06 1202) BP: (129-162)/(87-95) 149/95 (12/06 1202) SpO2:  [93 %-96 %] 95 % (12/06 1202) Blood pressure (!) 149/95, pulse 79, temperature 98 F (36.7 C), temperature source Oral, resp. rate 17, height 5' (1.524 m), weight 45.4 kg, SpO2 95%. Body mass index is 19.55 kg/m.  Physical Exam Constitutional:      Appearance: Normal appearance. She is not ill-appearing.  Neurological:     Mental Status: She is alert. Mental status is at baseline.     Mental Status Exam: General Appearance: Casual  Orientation:  Other:  Oriented to place and person only  Memory:  Immediate;   Fair Recent;   Poor Remote;   Poor  Concentration:  Concentration: Fair and Attention Span: Fair  Recall:  Poor  Attention  Fair  Eye Contact:  Fair  Speech:  WDL  Language:  Fair  Volume:  WDL  Mood: denies depression and anxiety  Affect:  Appropriate  Thought Process:  confused at times  Thought Content:  Delusions and Paranoid Ideation  Suicidal Thoughts:  No  Homicidal Thoughts:  No  Judgement:  Impaired  Insight:  Lacking  Psychomotor Activity:  Decreased  Akathisia:  No  Fund of Knowledge:  Poor      Assets:  Communication Skills Housing Social Support  Cognition:  Impaired,  Moderate  ADL's:  Impaired  AIMS (if indicated):        Other History   These have been pulled in through the EMR, reviewed, and updated if appropriate.  Family History:  The patient's family history is not on file.  Medical History: Past Medical History:  Diagnosis Date   Asthma    COPD (chronic obstructive pulmonary disease) (HCC)    Dementia (HCC)    Hypertension    Pneumothorax     Surgical History: Past Surgical History:  Procedure Laterality Date   CHEST TUBE INSERTION     VIDEO ASSISTED THORACOSCOPY Right 02/01/2018   Procedure: VIDEO ASSISTED  THORACOSCOPY, BLEB STAPLING;  Surgeon: Fleeta Hanford Coy, MD;  Location: [facility] OR;  Service: Thoracic;  Laterality: Right;     Medications:   Current Facility-Administered Medications:    acetaminophen  (TYLENOL ) tablet 650 mg, 650 mg, Oral, Q6H PRN, 650 mg at 05/20/24 0612 **OR** acetaminophen  (TYLENOL ) suppository  650 mg, 650 mg, Rectal, Q6H PRN, Melvin, Alexander B, MD   ARIPiprazole  (ABILIFY ) tablet 2 mg, 2 mg, Oral, Daily, Staton Markey, MD, 2 mg at 05/19/24 9173   ARIPiprazole  ER (ABILIFY  MAINTENA) injection 200 mg, 200 mg, Intramuscular, Q28 days, Larina Fling, MD   enoxaparin  (LOVENOX ) injection 40 mg, 40 mg, Subcutaneous, Q24H, Tariq, Hassan, MD, 40 mg at 05/19/24 1223   feeding supplement (ENSURE PLUS HIGH PROTEIN) liquid 237 mL, 237 mL, Oral, BID BM, Cosette Blackwater, MD, 237 mL at 05/20/24 9081   Gerhardt's butt cream, , Topical, BID, Samtani, Jai-Gurmukh, MD, Given at 05/20/24 0918   multivitamin with minerals tablet 1 tablet, 1 tablet, Oral, Daily, Samtani, Jai-Gurmukh, MD, 1 tablet at 05/19/24 9173   nutrition supplement (JUVEN) (JUVEN) powder packet 1 packet, 1 packet, Oral, BID BM, Tariq, Hassan, MD, 1 packet at 05/17/24 0853   sodium chloride  flush (NS) 0.9 % injection 3 mL, 3 mL, Intravenous, Q12H, Melvin, Alexander B, MD, 3 mL at 05/20/24 0919  Allergies: Allergies  Allergen Reactions   Aspirin Shortness Of Breath   Shellfish Allergy Anaphylaxis    FLING LARINA, MD

## 2024-05-21 NOTE — Consult Note (Signed)
 [facility]  Patient Name: .Carolyn Huynh  MRN: [id number]  DOB: 07-12-1955  Consult Order details:  Orders (From admission, onward)     Start     Ordered   05/05/24 1340  IP CONSULT TO PSYCHIATRY       Ordering Provider: Samtani, Jai-Gurmukh, MD  Provider:  (Not yet assigned)  Question Answer Comment  Location [facility]   Reason for Consult? shcizooid--non complait on meds---needs to be seen--read my note pls      05/05/24 1341             Mode of Visit: In person    Psychiatry Consult Evaluation  Service Date: May 21, 2024 LOS:  LOS: 13 days  Chief Complaint the patient is a 68 year old female with a past history of schizoaffective disorder/schizophrenia and a history of mild cognitive impairment referred for medication management.  Primary Psychiatric Diagnoses  Schizophrenia, paranoid type 2.  Delusional disorder 3.  Vascular dementia, moderate  Assessment  Carolyn Huynh is a 68 y.o. female admitted: Medicallyfor 05/03/2024  4:48 AM for increasing weakness and watery stools.. She carries the psychiatric diagnoses of delusional disorder/schizophrenia and has a past medical history of vascular dementia.   The patient's current presentation with delusions, thinking that she is adopted and that she was born in [place] and she was fluent in Chinese is consistent with schizophrenia and delusional disorder.  Patient lacks insight and some of her delusions are quite fixed and have been consistent for many years.  Antipsychotics appear to help her when she is in the hospital but she has been noncompliant. Given her noncompliance we will consider starting her on Invega  and if she improves on this medication with no side effects we will consider Invega  Sustenna for long-term relief. Given her vascular dementia, antipsychotics will be used with caution.  05/21/2024: The patient had been refusing oral medications and was getting  increasingly agitated with frequent hallucinations.  She was placed on nonemergency forced medications yesterday after multiple attempts to explain to her about why she needs to take her medications.  She apparently has taken medications by mouth since then.  Will continue to encourage her to take her medication as prescribed and if she refuses the nonemergency forced medication order is still valid for 72 hours.    Full recommendations below.  Diagnoses:  Active Hospital problems: Principal Problem:   Acute GI bleeding Active Problems:   Recurrent spontaneous pneumothorax   Schizophrenia, paranoid type (HCC)   Mixed Alzheimer's and vascular dementia (HCC)   Severe manic bipolar 1 disorder with psychotic behavior (HCC)   Vascular dementia with delusions (HCC)   Hypertension   Severe alcohol use disorder, in sustained remission (HCC)   Constipation   Heme positive stool   Abdominal cramping   Protein-calorie malnutrition, severe    Plan   ## Psychiatric Medication Recommendations:  Continue Abilify  2 mg a day - Continue with nonemergency forced medications x 72 hours.  Once patient is compliant she can resume oral medications only.  ## Medical Decision Making Capacity: Not specifically addressed in this encounter Patient does not have capacity to refuse medical care at this time.  ## Further Work-up:  -- Per the hospitalist EKG -- most recent EKG on 05/05/2024 had QtCb of 452 -- Pertinent labwork reviewed earlier this admission includes: As per the hospitalist - Recommend neurology follow-up for vascular dementia and possibly starting on medications if indicated.  ## Disposition:-- Plan Post Discharge/Psychiatric  Care Follow-up resources if patient ends up on long-acting injectable, she will need routine follow-up with psychiatry.  Recommend TOCfollow-up prior to discharge  ## Behavioral / Environmental: -Delirium Precautions: Delirium Interventions for Nursing and Staff: -  RN to open blinds every AM. - To Bedside: Glasses, hearing aide, and pt's own shoes. Make available to patients. when possible and encourage use. - Encourage po fluids when appropriate, keep fluids within reach. - OOB to chair with meals. - Passive ROM exercises to all extremities with AM & PM care. - RN to assess orientation to person, time and place QAM and PRN. - Recommend extended visitation hours with familiar family/friends as feasible. - Staff to minimize disturbances at night. Turn off television when pt asleep or when not in use. or Utilize compassion and acknowledge the patient's experiences while setting clear and realistic expectations for care.    ## Safety and Observation Level:  - Based on my clinical evaluation, I estimate the patient to be at low risk of self harm in the current setting. - At this time, we recommend  routine. This decision is based on my review of the chart including patient's history and current presentation, interview of the patient, mental status examination, and consideration of suicide risk including evaluating suicidal ideation, plan, intent, suicidal or self-harm behaviors, risk factors, and protective factors. This judgment is based on our ability to directly address suicide risk, implement suicide prevention strategies, and develop a safety plan while the patient is in the clinical setting. Please contact our team if there is a concern that risk level has changed.  CSSR Risk Category:C-SSRS RISK CATEGORY: No Risk  Suicide Risk Assessment: Patient has following modifiable risk factors for suicide: medication noncompliance and triggering events, which we are addressing by outpatient follow-up appointment and medication management. Patient has following non-modifiable or demographic risk factors for suicide: psychiatric hospitalization Patient has the following protective factors against suicide: Access to outpatient mental health care and Supportive  family  Thank you for this consult request. Recommendations have been communicated to the primary team.  We will continue to follow at this time.   Carolyn BEETS, MD       History of Present Illness  Relevant Aspects of [facility] Hospital Course:  Admitted on 05/03/2024 for weakness and nausea and vomiting. They the following her for her medical condition including pneumothorax, diverticulosis..   Patient Report:  The patient is a 68 year old female who lives with her daughter in [place]  but is originally from [place] .  Collateral from the family indicates that the patient has a long history of alcoholism and psychosis they remember event when she was young, drinking beer and talking to herself.  She has always been delusional and tends to talk to herself all the time.  She had a few hospitalizations for psychosis in [place]  but was mainly hospitalized for her alcoholism, lung disease and other medical conditions.  She moved to [place]  to stay with one of her daughters.  Patient has 3 daughters.  Apparently she has been hospitalized twice in 2021 and 23 for psychosis but has been noncompliant with medications.  Her last hospitalization 2023 to [facility] and she was placed on Zyprexa  which she promptly stopped after discharge. 05/06/2024: The patient was seen and evaluated.  She is lying in bed and appears fairly emaciated and dehydrated.  She is oriented to place and person only.  She maintained fair to good eye contact.  Her speech was low volume without any looseness of  associations or flight of ideas.  She reports she lives with her daughter in [place]  for the last 5 to 6 years.  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  When asked if you can speak Chinese she started speaking some gibberish that sounded like Chinese.  Although she did not get agitated she continued to ramble but in general was calm during  this interview.  She is not a great historian and was unable to give any details but gave me permission to talk to her daughters.  She denied any active SI/HI/AVH.  She has minimal insight and judgment and at the present time suspect that she lacks capacity to make informed decisions.  Will continue to evaluate her status after initiating a low-dose antipsychotic. 05/07/2024: The patient was seen and reevaluated today.  She is alert oriented and cooperative and in general is fairly pleasant without any agitation or behavioral disturbance.  However she remains delusional and still believes that she is a physician and that she was born and raised in [place].  Other than these fixed delusions patient denies any auditory visual hallucinations.  She denies suicidal ideations.  She has minimal insight and reports that she would prefer to live on her own and stay with her daughter. 05/08/2024: The patient was seen and reevaluated today.  She is alert oriented and cooperative and in general is fairly pleasant without any agitation or behavioral disturbance. Patient remains delusional stating that she is a doctor. She does not have any insight into why she is in the hospital but she has been compliant with treatment and has not required any medications for agitation. The patient states that she does not need any medications for mental health and denies any SI/HI/AVH or concerns that the treatment team is trying to hurt her.  05/09/2024: The patient is seen and evaluated today.  In addition discussed with the team.  The patient is oriented and in general cooperative and fairly pleasant until discussing her treatment plan.  She then becomes very aggressive verbally and claims that she is a clinical research associate and a doctor and that she was born in [place] and started showing some agitation.  She shows very poor insight and judgment.  However she clearly denies any active SI/HI/AVH. 05/10/2024: The patient was seen and reevaluated.  In  general the patient is oriented and cooperative and fairly pleasant.  She is not as verbally aggressive today.  She reports that none of her children visited her.  And examined she is alert oriented and cooperative.  She reports no SI/HI/AVH.  However she remains with fixed delusions and claims that she is a doctor and a lawyer and that next time I come there she is going to show me her diploma.  Other than some of the fixed beliefs, patient appears fairly cooperative. 05/11/2024: The patient was seen and evaluated today.  She is laying in bed and appears to be in no acute distress.  Her speech is coherent without any obvious looseness of association or flight of ideas or tangentiality.  She still maintains the same delusions and continues to insist that she has a doctor and a lawyer but does not appear to be currently answering to herself either today.  Overall she denies active SI/HI/AVH and is redirectable.  Given her creatinine clearance will change from Invega  to Abilify . 05/12/2024: Patient was seen and reevaluated.  No significant change.  She still has  her fixed delusions but has not been a behavioral problem and has not demonstrated any agitation. Today she denies any active SI/HI/AVH. She is now on Abilify  2 mg a day.  In this patient if, she is going to have a SNF facility, long-acting injectable may not be needed since she will be monitored and medications will be provided.  However if she goes home to family, it may be worthwhile investigating LAI.  05/14/2024: No significant change.  She continues to have delusions with no agitation.  Her oral medications continue, compliant, consider a LAI if she returns home.  05/16/2024 Patient continues to be delusional.  No agitation events noted per chart review.  She has been compliant with her oral medications and will continue.  Still awaiting dispo plan for facility placement versus discharge with daughter.  If discharged with daughter will consider  LAI 05/18/2024: Patient is alert oriented cooperative and pleasant.  She maintains good eye contact.  Speech is without any obvious looseness of associations flight of ideas or tangentiality.  She is currently on Abilify  2 mg a day and does not have any active auditory visual hallucinations.  Her delusions are fixed and unchanged.  However she is not agitated.  No active SI/HI/AVH.  Plan is to give her Abilify  maintainna 200 mg IM  today.  05/20/2024: The patient was seen and evaluated and chart was reviewed and the case was discussed with the hospitalist.  Patient continues to refuse all medications and apparently had refused the long-acting injectable.  She is not taking the Abilify  by mouth and is getting more irritable labile and paranoid.  She continues with a fixed delusions but is also more agitated now specially when she is missing a few doses of the antipsychotic.  Although she denies any active SI/HI/AVH, she is a lot more irritable labile and refusing treatments and medications.  Will consider starting a nonemergency forced medications and also contacting the sisters to ensure that they can continue to pursue the guardianship which will allow us  to give her the long-acting injectable. 05/21/2024: The patient was seen and reevaluated today.  Today she was actually sitting up doing her hygiene and was fairly pleasant cooperative and reports that she is taking her medications and will take her medications by mouth.  She is contracting for safety and no acute agitation or psychosis was noted.  Psych ROS:  Depression: Denied Anxiety: Denied Mania (lifetime and current): Unknown Psychosis: (lifetime and current): Lifelong history of psychosis and delusions  Collateral information:  Contacted patient's 2 daughters Estefana and [name] at [phone] on 05/06/2024 Both the daughters are good historians and report that they have been taking care of the mother and that she has always been delusional and  paranoid which seems to exacerbate when she is drinking.  They have been also trying to get guardianship but so far they do not have guardianship.  Patient lives with one of her daughters. Her daughters are very supportive and are willing to be involved in discharge planning.   Daughter [name] Cassis [phone] 05/08/2024 She reports that she has attempted to get guardianship over her mother in the past however she does not have the money to pay for a lawyer. She is asking for assistance with this process because she does not think that her mother will stay compliant with her treatment when she leaves the hospital and in the past she has not been forthcoming with her discharge paperwork making it more difficult for the family to help  her with treatment.   On 05/12/2024, contacted Estefana Flatter who reports that they are continuing to pursue guardianship.  Her sister who lives in [place] plans to come and visit the mother but apparently has been having some trouble with her car.   Review of Systems  Psychiatric/Behavioral:  Negative for hallucinations. The patient is not nervous/anxious.        Delusional   All other systems reviewed and are negative.    Psychiatric and Social History  Psychiatric History:  Information collected from medical records and family.  Prev Dx/Sx: Schizophrenia, delusional disorder, vascular dementia Current Psych Provider: Last hospitalized at [facility]. Home Meds (current): Zyprexa , noncompliant Previous Med Trials: Unknown Therapy: Unknown  Prior Psych Hospitalization: At least 2 hospitalizations in [place]  in 2021 at [facility] in [facility]23 at [facility] Prior Self Harm: None Prior Violence: None  Family Psych History: Unknown Family Hx suicide: Unknown  Social History:  The patient is single, currently living with her daughter in [place] .  Prior to that she was living in [place]  with her other daughter. Access to weapons/lethal means:  None  Substance History Extensive history of alcoholism.  Exam Findings  Physical Exam: Per the hospitalist Vital Signs:  Temp:  [97.8 F (36.6 C)-98.5 F (36.9 C)] 98.5 F (36.9 C) (12/07 1147) Pulse Rate:  [62-83] 83 (12/07 1147) Resp:  [17-18] 18 (12/07 1147) BP: (132-153)/(77-95) 148/84 (12/07 1147) SpO2:  [94 %-99 %] 99 % (12/07 1147) Blood pressure (!) 148/84, pulse 83, temperature 98.5 F (36.9 C), resp. rate 18, height 5' (1.524 m), weight 45.4 kg, SpO2 99%. Body mass index is 19.55 kg/m.  Physical Exam Constitutional:      Appearance: Normal appearance. She is not ill-appearing.  Neurological:     Mental Status: She is alert. Mental status is at baseline.     Mental Status Exam: General Appearance: Casual  Orientation:  Other:  Oriented to place and person only  Memory:  Immediate;   Fair Recent;   Poor Remote;   Poor  Concentration:  Concentration: Fair and Attention Span: Fair  Recall:  Poor  Attention  Fair  Eye Contact:  Fair  Speech:  WDL  Language:  Fair  Volume:  WDL  Mood: denies depression and anxiety  Affect:  Appropriate  Thought Process:  confused at times  Thought Content:  Delusions and Paranoid Ideation  Suicidal Thoughts:  No  Homicidal Thoughts:  No  Judgement:  Impaired  Insight:  Lacking  Psychomotor Activity:  Decreased  Akathisia:  No  Fund of Knowledge:  Poor      Assets:  Communication Skills Housing Social Support  Cognition:  Impaired,  Moderate  ADL's:  Impaired  AIMS (if indicated):        Other History   These have been pulled in through the EMR, reviewed, and updated if appropriate.  Family History:  The patient's family history is not on file.  Medical History: Past Medical History:  Diagnosis Date   Asthma    COPD (chronic obstructive pulmonary disease) (HCC)    Dementia (HCC)    Hypertension    Pneumothorax     Surgical History: Past Surgical History:  Procedure Laterality Date   CHEST TUBE  INSERTION     VIDEO ASSISTED THORACOSCOPY Right 02/01/2018   Procedure: VIDEO ASSISTED THORACOSCOPY, BLEB STAPLING;  Surgeon: Fleeta Hanford Coy, MD;  Location: [facility] OR;  Service: Thoracic;  Laterality: Right;     Medications:   Current Facility-Administered Medications:  acetaminophen  (TYLENOL ) tablet 650 mg, 650 mg, Oral, Q6H PRN, 650 mg at 05/20/24 0612 **OR** acetaminophen  (TYLENOL ) suppository 650 mg, 650 mg, Rectal, Q6H PRN, Melvin, Alexander B, MD   OLANZapine  (ZYPREXA ) injection 2.5 mg, 2.5 mg, Intramuscular, BID, 2.5 mg at 05/20/24 1803 **OR** ARIPiprazole  (ABILIFY ) tablet 2 mg, 2 mg, Oral, BID, Jaiden Wahab, MD, 2 mg at 05/21/24 1016   ARIPiprazole  ER (ABILIFY  MAINTENA) injection 200 mg, 200 mg, Intramuscular, Q28 days, Larina Fling, MD   enoxaparin  (LOVENOX ) injection 40 mg, 40 mg, Subcutaneous, Q24H, Tariq, Hassan, MD, 40 mg at 05/19/24 1223   feeding supplement (ENSURE PLUS HIGH PROTEIN) liquid 237 mL, 237 mL, Oral, BID BM, Cosette Blackwater, MD, 237 mL at 05/21/24 1018   Gerhardt's butt cream, , Topical, BID, Samtani, Jai-Gurmukh, MD, Given at 05/21/24 1018   multivitamin with minerals tablet 1 tablet, 1 tablet, Oral, Daily, Samtani, Jai-Gurmukh, MD, 1 tablet at 05/19/24 9173   nutrition supplement (JUVEN) (JUVEN) powder packet 1 packet, 1 packet, Oral, BID BM, Tariq, Hassan, MD, 1 packet at 05/17/24 0853   sodium chloride  flush (NS) 0.9 % injection 3 mL, 3 mL, Intravenous, Q12H, Melvin, Alexander B, MD, 3 mL at 05/21/24 1017  Allergies: Allergies  Allergen Reactions   Aspirin Shortness Of Breath   Shellfish Allergy Anaphylaxis    FLING LARINA, MD

## 2024-05-21 NOTE — Progress Notes (Signed)
 Wound care done

## 2024-05-21 NOTE — Progress Notes (Signed)
 PROGRESS NOTE    Carolyn Huynh  FMW:[id number] DOB: 1956/03/18 DOA: 05/03/2024 PCP: Carolyn Huynh    Hospital Course: 68 year old female with longstanding schizophrenia, delusional disorder, EtOH abuse with psychiatric hospitalization in 2023, pneumothorax with surgery to left chest in 2000 complicated by spontaneous pneumothoraces managed by Dr. Vantrigt-most recent admission for this 2019 undergoing a right VATS with stapling of blebs and mechanical pleurodesis. Brought to ED from home with daughter increasing weakness, red water  stools. Evaluation in the ED no obvious hemorrhoids had bright red blood Huynh rectum. FOBT was positive. Found to have perianal fistula as likely bleeding source with surrounding soft tissue cellulitis that was treated with antibiotics and local wound care. Seen by gynecologic surgery with no plan for surgical intervention. Patient was seen by GI and refused colonoscopy. Hospital course complicated by stercoral colitis that resolved with bowel regimen, and shizophrenia that is managed with medication adjustments by psychiatry team.    Subjective Today, pt more pleasant, less agitated.     Assessment and Plan:  Perianal fistula Buttock abscess No surgical intervention as Huynh gynecologic surgery Finished 5 days of antibiotics course on 11/28 Continue with local wound care as Huynh wound care team recommendations Will need to follow up with surgery outpatient for perianal fistula closure   Stercoral colitis Resolved with bowel regimen Monitor for regular BMs   Renal cyst Noted on CT abdomen/pelvis to be 2.9cm Will need outpatient workup   Schizophrenia Severe psychosis with lack of capacity to make informed decisions regarding treatment. Persistent refusal of medication places the patient at risk for serious medical complications and further psychiatric deterioration On 12/6, I agree with psychiatrist Dr. Larina, to proceed with a nonemergency forced  medication order under [place] Stat[place] 122C-57(d) to ensure patient safety and promote psychiatric stabilization.  Plan to continue to monitor for side effects, encourage voluntary participation, and reassess decision-making capacity regularly Cont oral Abilify  (if refusing, consider nonemergency forced medication using low-dose Zyprexa  as Huynh psychiatry) Patient refused long-term IM Abilify  Psychiatry following   Moderate Alzheimer's dementia Delirium precautions   Bright red blood Huynh rectum Repeat FOBT 11/20 negative GI signed off as patient refused colonoscopy and will not drink bowel prep Monitor clinically  DVT prophylaxis: enoxaparin  (LOVENOX ) injection 40 mg Start: 05/13/24 1100 SCDs Start: 05/13/24 1011  Lovenox    Code Status: Full Code Disposition Plan: TBD, likely SNF Reason for continuing need for hospitalization:  medically ready, placement pending, SW following   Objective: Vitals:   05/20/24 2351 05/21/24 0425 05/21/24 0728 05/21/24 1147  BP: (!) 132/90 132/84 (!) 143/77 (!) 148/84  Pulse: 72 62 81 83  Resp: 18  18 18   Temp: 98.4 F (36.9 C) 97.9 F (36.6 C) 98 F (36.7 C) 98.5 F (36.9 C)  TempSrc:      SpO2: 94% 99% 95% 99%  Weight:      Height:        Intake/Output Summary (Last 24 hours) at 05/21/2024 1248 Last data filed at 05/20/2024 2210 Gross Huynh 24 hour  Intake 6 ml  Output --  Net 6 ml   Filed Weights   05/05/24 1522 05/11/24 1857 05/16/24 1704  Weight: 47.2 kg 47.2 kg 45.4 kg    Examination:  General: NAD  Cardiovascular: S1, S2 present Respiratory: CTAB Abdomen: Soft, nontender, nondistended, bowel sounds present Musculoskeletal: No bilateral pedal edema noted Skin: Normal Psychiatry: Normal mood  Data Reviewed: I have personally reviewed following labs and imaging studies  CBC: Recent Labs  Lab 05/18/24 0434  WBC 6.4  NEUTROABS 4.4  HGB 11.1*  HCT 34.4*  MCV 91.0  PLT 403*   Basic Metabolic Panel: Recent Labs  Lab  05/18/24 0434 05/20/24 0559  NA 138  --   K 4.1  --   CL 106  --   CO2 27  --   GLUCOSE 85  --   BUN 14  --   CREATININE 0.87 0.70  CALCIUM 8.7*  --    GFR: Estimated Creatinine Clearance: 46.9 mL/min (by C-G formula based on SCr of 0.7 mg/dL). Liver Function Tests: No results for input(s): AST, ALT, ALKPHOS, BILITOT, PROT, ALBUMIN in the last 168 hours.  No results for input(s): LIPASE, AMYLASE in the last 168 hours. No results for input(s): AMMONIA in the last 168 hours. Coagulation Profile: No results for input(s): INR, PROTIME in the last 168 hours. Cardiac Enzymes: No results for input(s): CKTOTAL, CKMB, CKMBINDEX, TROPONINI in the last 168 hours. ProBNP, BNP (last 5 results) No results for input(s): PROBNP, BNP in the last 8760 hours. HbA1C: No results for input(s): HGBA1C in the last 72 hours. CBG: No results for input(s): GLUCAP in the last 168 hours. Lipid Profile: No results for input(s): CHOL, HDL, LDLCALC, TRIG, CHOLHDL, LDLDIRECT in the last 72 hours. Thyroid Function Tests: No results for input(s): TSH, T4TOTAL, FREET4, T3FREE, THYROIDAB in the last 72 hours. Anemia Panel: No results for input(s): VITAMINB12, FOLATE, FERRITIN, TIBC, IRON, RETICCTPCT in the last 72 hours. Sepsis Labs: No results for input(s): PROCALCITON, LATICACIDVEN in the last 168 hours.  No results found for this or any previous visit (from the past 240 hours).   Radiology Studies: No results found.  Scheduled Meds:  OLANZapine   2.5 mg Intramuscular BID   Or   ARIPiprazole   2 mg Oral BID   ARIPiprazole  ER  200 mg Intramuscular Q28 days   enoxaparin  (LOVENOX ) injection  40 mg Subcutaneous Q24H   feeding supplement  237 mL Oral BID BM   Gerhardt's butt cream   Topical BID   multivitamin with minerals  1 tablet Oral Daily   nutrition supplement (JUVEN)  1 packet Oral BID BM   sodium chloride  flush  3 mL  Intravenous Q12H   Continuous Infusions:   LOS: 13 days   Carolyn JINNY Cage, MD  [facility]  05/21/2024, 12:48 PM

## 2024-05-21 NOTE — Plan of Care (Signed)

## 2024-05-22 DIAGNOSIS — K604 Rectal fistula, unspecified: Secondary | ICD-10-CM

## 2024-05-22 DIAGNOSIS — L8990 Pressure ulcer of unspecified site, unspecified stage: Secondary | ICD-10-CM | POA: Insufficient documentation

## 2024-05-22 DIAGNOSIS — K625 Hemorrhage of anus and rectum: Principal | ICD-10-CM

## 2024-05-22 NOTE — Progress Notes (Signed)
 PROGRESS NOTE    Carolyn Huynh  FMW:[id number] DOB: [birth year] DOA: 05/03/2024 PCP: Patient, [name]    Hospital Course: 68 year old female with longstanding schizophrenia, delusional disorder, EtOH abuse with psychiatric hospitalization in 2023, pneumothorax with surgery to left chest in 2000 complicated by spontaneous pneumothoraces managed by Dr. Vantrigt-most recent admission for this 2019 undergoing a right VATS with stapling of blebs and mechanical pleurodesis. Brought to ED from home with daughter increasing weakness, red water  stools. Evaluation in the ED no obvious hemorrhoids had bright red blood per rectum. FOBT was positive. Found to have perianal fistula as likely bleeding source with surrounding soft tissue cellulitis that was treated with antibiotics and local wound care. Seen by gynecologic surgery with no plan for surgical intervention. Patient was seen by GI and refused colonoscopy. Hospital course complicated by stercoral colitis that resolved with bowel regimen, and shizophrenia that is managed with medication adjustments by psychiatry team.     Subjective Today, met patient washing up over the sink. Denied any new complaints.     Assessment and Plan:  Perianal fistula Buttock abscess No surgical intervention as per gynecologic surgery Finished 5 days of antibiotics course on 11/28 Continue with local wound care as per wound care team recommendations Will need to follow up with surgery outpatient for perianal fistula closure   Stercoral colitis Resolved with bowel regimen Monitor for regular BMs   Renal cyst Noted on CT abdomen/pelvis to be 2.9cm Will need outpatient workup   Schizophrenia Severe psychosis with lack of capacity to make informed decisions regarding treatment. Persistent refusal of medication places the patient at risk for serious medical complications and further psychiatric deterioration On 12/6, I agreed with psychiatrist Dr. Larina, to  proceed with a nonemergency forced medication order under [place] 122C-57(d) to ensure patient safety and promote psychiatric stabilization.  Plan to continue to monitor for side effects, encourage voluntary participation, and reassess decision-making capacity regularly Cont oral Abilify  (if refusing, consider nonemergency forced medication using low-dose Zyprexa  as per psychiatry) Patient refused long-term IM Abilify  Psychiatry following   Moderate Alzheimer's dementia Delirium precautions   Bright red blood per rectum Repeat FOBT 11/20 negative GI signed off as patient refused colonoscopy and will not drink bowel prep Monitor clinically  DVT prophylaxis: enoxaparin  (LOVENOX ) injection 40 mg Start: 05/13/24 1100 SCDs Start: 05/13/24 1011  Lovenox    Code Status: Full Code Disposition Plan: TBD, likely SNF Reason for continuing need for hospitalization:  medically ready, placement pending, SW following   Objective: Vitals:   05/21/24 1147 05/21/24 2317 05/22/24 0552 05/22/24 0825  BP: (!) 148/84 (!) 140/88 (!) 141/79 (!) 158/99  Pulse: 83 76 79 92  Resp: 18 17 18 18   Temp: 98.5 F (36.9 C) 97.8 F (36.6 C) 97.8 F (36.6 C) 98.3 F (36.8 C)  TempSrc:  Oral Oral   SpO2: 99% 95% 95% 94%  Weight:      Height:        Intake/Output Summary (Last 24 hours) at 05/22/2024 1116 Last data filed at 05/21/2024 2049 Gross per 24 hour  Intake 106 ml  Output --  Net 106 ml   Filed Weights   05/05/24 1522 05/11/24 1857 05/16/24 1704  Weight: 47.2 kg 47.2 kg 45.4 kg    Examination:  General: NAD  Cardiovascular: S1, S2 present Respiratory: CTAB Abdomen: Soft, nontender, nondistended, bowel sounds present Musculoskeletal: No bilateral pedal edema noted Skin: Normal Psychiatry: Normal mood  Data Reviewed: I have personally reviewed following  labs and imaging studies  CBC: Recent Labs  Lab 05/18/24 0434  WBC 6.4  NEUTROABS 4.4  HGB 11.1*  HCT 34.4*  MCV 91.0   PLT 403*   Basic Metabolic Panel: Recent Labs  Lab 05/18/24 0434 05/20/24 0559  NA 138  --   K 4.1  --   CL 106  --   CO2 27  --   GLUCOSE 85  --   BUN 14  --   CREATININE 0.87 0.70  CALCIUM 8.7*  --    GFR: Estimated Creatinine Clearance: 46.9 mL/min (by C-G formula based on SCr of 0.7 mg/dL). Liver Function Tests: No results for input(s): AST, ALT, ALKPHOS, BILITOT, PROT, ALBUMIN in the last 168 hours.  No results for input(s): LIPASE, AMYLASE in the last 168 hours. No results for input(s): AMMONIA in the last 168 hours. Coagulation Profile: No results for input(s): INR, PROTIME in the last 168 hours. Cardiac Enzymes: No results for input(s): CKTOTAL, CKMB, CKMBINDEX, TROPONINI in the last 168 hours. ProBNP, BNP (last 5 results) No results for input(s): PROBNP, BNP in the last 8760 hours. HbA1C: No results for input(s): HGBA1C in the last 72 hours. CBG: No results for input(s): GLUCAP in the last 168 hours. Lipid Profile: No results for input(s): CHOL, HDL, LDLCALC, TRIG, CHOLHDL, LDLDIRECT in the last 72 hours. Thyroid Function Tests: No results for input(s): TSH, T4TOTAL, FREET4, T3FREE, THYROIDAB in the last 72 hours. Anemia Panel: No results for input(s): VITAMINB12, FOLATE, FERRITIN, TIBC, IRON, RETICCTPCT in the last 72 hours. Sepsis Labs: No results for input(s): PROCALCITON, LATICACIDVEN in the last 168 hours.  No results found for this or any previous visit (from the past 240 hours).   Radiology Studies: No results found.  Scheduled Meds:  OLANZapine   2.5 mg Intramuscular BID   Or   ARIPiprazole   2 mg Oral BID   ARIPiprazole  ER  200 mg Intramuscular Q28 days   enoxaparin  (LOVENOX ) injection  40 mg Subcutaneous Q24H   feeding supplement  237 mL Oral BID BM   Gerhardt's butt cream   Topical BID   multivitamin with minerals  1 tablet Oral Daily   nutrition supplement (JUVEN)   1 packet Oral BID BM   sodium chloride  flush  3 mL Intravenous Q12H   Continuous Infusions:   LOS: 14 days   Carolyn JINNY Cage, MD  [facility]  05/22/2024, 11:16 AM

## 2024-05-23 DIAGNOSIS — K922 Gastrointestinal hemorrhage, unspecified: Secondary | ICD-10-CM | POA: Diagnosis not present

## 2024-05-23 MED ORDER — ARIPIPRAZOLE 5 MG PO TABS
5.0000 mg | ORAL_TABLET | Freq: Every day | ORAL | Status: DC
  Administered 2024-05-24 – 2024-05-29 (×6): 5 mg via ORAL
  Filled 2024-05-23 (×7): qty 1

## 2024-05-23 NOTE — Plan of Care (Signed)

## 2024-05-23 NOTE — Consult Note (Signed)
 [facility] Consult Follow-up  Patient Name: .Carolyn Huynh  MRN: [id number]  DOB: [birth year]  Consult Order details:  Orders (From admission, onward)     Start     Ordered   05/05/24 1340  IP CONSULT TO PSYCHIATRY       Ordering Provider: Samtani, Jai-Gurmukh, MD  Provider:  (Not yet assigned)  Question Answer Comment  Location [facility]   Reason for Consult? shcizooid--non complait on meds---needs to be seen--read my note pls      05/05/24 1341             Mode of Visit: In person    Psychiatry Consult Evaluation  Service Date: May 23, 2024 LOS:  LOS: 15 days  Chief Complaint the patient is a 68 year old female with a past history of schizoaffective disorder/schizophrenia and a history of mild cognitive impairment referred for medication management.  Primary Psychiatric Diagnoses  Schizophrenia, paranoid type 2.  Delusional disorder 3.  Vascular dementia, moderate  Assessment  Carolyn Huynh is a 68 y.o. female admitted: Medicallyfor 05/03/2024  4:48 AM for increasing weakness and watery stools.. She carries the psychiatric diagnoses of delusional disorder/schizophrenia and has a past medical history of vascular dementia.   The patient's current presentation with delusions, thinking that she is adopted and that she was born in [place] and she was fluent in Chinese is consistent with schizophrenia and delusional disorder.  Patient lacks insight and some of her delusions are quite fixed and have been consistent for many years.  Antipsychotics appear to help her when she is in the hospital but she has been noncompliant. Given her noncompliance we will consider starting her on Invega  and if she improves on this medication with no side effects we will consider Invega  Sustenna for long-term relief. Given her vascular dementia, antipsychotics will be used with caution.  05/23/2024: The patient remains on the nonemergency forced medications to  ensure a safety however she has been taking her psychotropics orally without any concern.  However she is fairly inconsistent.  She is refused to long-acting injectable.  She seems generally pleasant and cooperative.  Recommend continuing the current medication regimen and since she is on Abilify  2 mg twice a day we will consider consolidating it to 5 mg once a day.  Full recommendations below.  Diagnoses:  Active Hospital problems: Principal Problem:   Acute GI bleeding Active Problems:   Recurrent spontaneous pneumothorax   Schizophrenia, paranoid type (HCC)   Mixed Alzheimer's and vascular dementia (HCC)   Severe manic bipolar 1 disorder with psychotic behavior (HCC)   Vascular dementia with delusions (HCC)   Hypertension   Severe alcohol use disorder, in sustained remission (HCC)   Constipation   Heme positive stool   Abdominal cramping   Protein-calorie malnutrition, severe   Rectal bleeding   Rectal fistula   Pressure injury of skin    Plan   ## Psychiatric Medication Recommendations:  Increase Abilify  to 5 mg once a day - Continue with nonemergency forced medications x 72 hours.  Once patient is compliant she can resume oral medications only.  ## Medical Decision Making Capacity: Not specifically addressed in this encounter Patient does not have capacity to refuse medical care at this time.  ## Further Work-up:  -- Per the hospitalist EKG -- most recent EKG on 05/05/2024 had QtCb of 452 -- Pertinent labwork reviewed earlier this admission includes: As per the hospitalist - Recommend neurology follow-up for vascular dementia and possibly starting  on medications if indicated.  ## Disposition:-- Plan Post Discharge/Psychiatric Care Follow-up resources if patient ends up on long-acting injectable, she will need routine follow-up with psychiatry.  Recommend TOCfollow-up prior to discharge  ## Behavioral / Environmental: -Delirium Precautions: Delirium Interventions for  Nursing and Staff: - RN to open blinds every AM. - To Bedside: Glasses, hearing aide, and pt's own shoes. Make available to patients. when possible and encourage use. - Encourage po fluids when appropriate, keep fluids within reach. - OOB to chair with meals. - Passive ROM exercises to all extremities with AM & PM care. - RN to assess orientation to person, time and place QAM and PRN. - Recommend extended visitation hours with familiar family/friends as feasible. - Staff to minimize disturbances at night. Turn off television when pt asleep or when not in use. or Utilize compassion and acknowledge the patient's experiences while setting clear and realistic expectations for care.    ## Safety and Observation Level:  - Based on my clinical evaluation, I estimate the patient to be at low risk of self harm in the current setting. - At this time, we recommend  routine. This decision is based on my review of the chart including patient's history and current presentation, interview of the patient, mental status examination, and consideration of suicide risk including evaluating suicidal ideation, plan, intent, suicidal or self-harm behaviors, risk factors, and protective factors. This judgment is based on our ability to directly address suicide risk, implement suicide prevention strategies, and develop a safety plan while the patient is in the clinical setting. Please contact our team if there is a concern that risk level has changed.  CSSR Risk Category:C-SSRS RISK CATEGORY: No Risk  Suicide Risk Assessment: Patient has following modifiable risk factors for suicide: medication noncompliance and triggering events, which we are addressing by outpatient follow-up appointment and medication management. Patient has following non-modifiable or demographic risk factors for suicide: psychiatric hospitalization Patient has the following protective factors against suicide: Access to outpatient mental health care and  Supportive family  Thank you for this consult request. Recommendations have been communicated to the primary team.  We will continue to follow at this time.   [name], MD       History of Present Illness  Relevant Aspects of [facility] Hospital Course:  Admitted on 05/03/2024 for weakness and nausea and vomiting. They the following her for her medical condition including pneumothorax, diverticulosis..   Patient Report:  The patient is a 68 year old female who lives with her daughter in [place]  but is originally from [place] .  Collateral from the family indicates that the patient has a long history of alcoholism and psychosis they remember event when she was young, drinking beer and talking to herself.  She has always been delusional and tends to talk to herself all the time.  She had a few hospitalizations for psychosis in [place]  but was mainly hospitalized for her alcoholism, lung disease and other medical conditions.  She moved to [place]  to stay with one of her daughters.  Patient has 3 daughters.  Apparently she has been hospitalized twice in 2021 and 23 for psychosis but has been noncompliant with medications.  Her last hospitalization 2023 to [facility] and she was placed on Zyprexa  which she promptly stopped after discharge. 05/06/2024: The patient was seen and evaluated.  She is lying in bed and appears fairly emaciated and dehydrated.  She is oriented to place and person only.  She maintained fair to good eye contact.  Her speech was low volume without any looseness of associations or flight of ideas.  She reports she lives with her daughter in   for the last 5 to 6 years.  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  When asked if you can speak Chinese she started speaking some gibberish that sounded like Chinese.  Although she did not get agitated she continued to ramble but in general was  calm during this interview.  She is not a great historian and was unable to give any details but gave me permission to talk to her daughters.  She denied any active SI/HI/AVH.  She has minimal insight and judgment and at the present time suspect that she lacks capacity to make informed decisions.  Will continue to evaluate her status after initiating a low-dose antipsychotic. 05/07/2024: The patient was seen and reevaluated today.  She is alert oriented and cooperative and in general is fairly pleasant without any agitation or behavioral disturbance.  However she remains delusional and still believes that she is a physician and that she was born and raised in [place].  Other than these fixed delusions patient denies any auditory visual hallucinations.  She denies suicidal ideations.  She has minimal insight and reports that she would prefer to live on her own and stay with her daughter. 05/08/2024: The patient was seen and reevaluated today.  She is alert oriented and cooperative and in general is fairly pleasant without any agitation or behavioral disturbance. Patient remains delusional stating that she is a doctor. She does not have any insight into why she is in the hospital but she has been compliant with treatment and has not required any medications for agitation. The patient states that she does not need any medications for mental health and denies any SI/HI/AVH or concerns that the treatment team is trying to hurt her.  05/09/2024: The patient is seen and evaluated today.  In addition discussed with the team.  The patient is oriented and in general cooperative and fairly pleasant until discussing her treatment plan.  She then becomes very aggressive verbally and claims that she is a clinical research associate and a doctor and that she was born in [place] and started showing some agitation.  She shows very poor insight and judgment.  However she clearly denies any active SI/HI/AVH. 05/10/2024: The patient was seen and  reevaluated.  In general the patient is oriented and cooperative and fairly pleasant.  She is not as verbally aggressive today.  She reports that none of her children visited her.  And examined she is alert oriented and cooperative.  She reports no SI/HI/AVH.  However she remains with fixed delusions and claims that she is a doctor and a lawyer and that next time I come there she is going to show me her diploma.  Other than some of the fixed beliefs, patient appears fairly cooperative. 05/11/2024: The patient was seen and evaluated today.  She is laying in bed and appears to be in no acute distress.  Her speech is coherent without any obvious looseness of association or flight of ideas or tangentiality.  She still maintains the same delusions and continues to insist that she has a doctor and a lawyer but does not appear to be currently answering to herself either today.  Overall she denies active SI/HI/AVH and is redirectable.  Given her creatinine clearance will change from Invega  to Abilify . 05/12/2024: Patient was seen and  reevaluated.  No significant change.  She still has her fixed delusions but has not been a behavioral problem and has not demonstrated any agitation. Today she denies any active SI/HI/AVH. She is now on Abilify  2 mg a day.  In this patient if, she is going to have a SNF facility, long-acting injectable may not be needed since she will be monitored and medications will be provided.  However if she goes home to family, it may be worthwhile investigating LAI.  05/14/2024: No significant change.  She continues to have delusions with no agitation.  Her oral medications continue, compliant, consider a LAI if she returns home.  05/16/2024 Patient continues to be delusional.  No agitation events noted per chart review.  She has been compliant with her oral medications and will continue.  Still awaiting dispo plan for facility placement versus discharge with daughter.  If discharged with  daughter will consider LAI 05/18/2024: Patient is alert oriented cooperative and pleasant.  She maintains good eye contact.  Speech is without any obvious looseness of associations flight of ideas or tangentiality.  She is currently on Abilify  2 mg a day and does not have any active auditory visual hallucinations.  Her delusions are fixed and unchanged.  However she is not agitated.  No active SI/HI/AVH.  Plan is to give her Abilify  maintainna 200 mg IM  today.  05/20/2024: The patient was seen and evaluated and chart was reviewed and the case was discussed with the hospitalist.  Patient continues to refuse all medications and apparently had refused the long-acting injectable.  She is not taking the Abilify  by mouth and is getting more irritable labile and paranoid.  She continues with a fixed delusions but is also more agitated now specially when she is missing a few doses of the antipsychotic.  Although she denies any active SI/HI/AVH, she is a lot more irritable labile and refusing treatments and medications.  Will consider starting a nonemergency forced medications and also contacting the sisters to ensure that they can continue to pursue the guardianship which will allow us  to give her the long-acting injectable. 05/21/2024: The patient was seen and reevaluated today.  Today she was actually sitting up doing her hygiene and was fairly pleasant cooperative and reports that she is taking her medications and will take her medications by mouth.  She is contracting for safety and no acute agitation or psychosis was noted. 05/23/2024: The patient was seen and evaluated.  She was laying in bed fairly cooperative and grooming her hair.  She greeted the clinical research associate, said good afternoon and reported that she was doing okay.  She talked to her children yesterday and continues to insist that although she is staying with them temporarily, she prefers she have her own place.  She has lack of insight about her ability to take  care of herself.  She will end up pursuing guardianship.  She denies any active SI/HI/AVH. Psych ROS:  Depression: Denied Anxiety: Denied Mania (lifetime and current): Unknown Psychosis: (lifetime and current): Lifelong history of psychosis and delusions  Collateral information:  Contacted patient's 2 daughters Estefana and [name] at [phone] on 05/06/2024 Both the daughters are good historians and report that they have been taking care of the mother and that she has always been delusional and paranoid which seems to exacerbate when she is drinking.  They have been also trying to get guardianship but so far they do not have guardianship.  Patient lives with one of her daughters. Her daughters  are very supportive and are willing to be involved in discharge planning.   Daughter [name] Cassis [phone] 05/08/2024 She reports that she has attempted to get guardianship over her mother in the past however she does not have the money to pay for a lawyer. She is asking for assistance with this process because she does not think that her mother will stay compliant with her treatment when she leaves the hospital and in the past she has not been forthcoming with her discharge paperwork making it more difficult for the family to help her with treatment.   On 05/12/2024, contacted Estefana Flatter who reports that they are continuing to pursue guardianship.  Her sister who lives in [place] plans to come and visit the mother but apparently has been having some trouble with her car.   Review of Systems  Psychiatric/Behavioral:  Negative for hallucinations. The patient is not nervous/anxious.        Delusional   All other systems reviewed and are negative.    Psychiatric and Social History  Psychiatric History:  Information collected from medical records and family.  Prev Dx/Sx: Schizophrenia, delusional disorder, vascular dementia Current Psych Provider: Last hospitalized at [facility]. Home Meds  (current): Zyprexa , noncompliant Previous Med Trials: Unknown Therapy: Unknown  Prior Psych Hospitalization: At least 2 hospitalizations in [place]  in 2021 at [facility] in 2023 at [facility] Prior Self Harm: None Prior Violence: None  Family Psych History: Unknown Family Hx suicide: Unknown  Social History:  The patient is single, currently living with her daughter in [place] .  Prior to that she was living in [place]  with her other daughter. Access to weapons/lethal means: None  Substance History Extensive history of alcoholism.  Exam Findings  Physical Exam: Per the hospitalist Vital Signs:  Temp:  [97.4 F (36.3 C)-98.5 F (36.9 C)] 97.5 F (36.4 C) (12/09 1139) Pulse Rate:  [73-79] 79 (12/09 1139) Resp:  [16-20] 18 (12/09 1139) BP: (104-147)/(67-91) 147/72 (12/09 1139) SpO2:  [94 %-100 %] 94 % (12/09 1139) Blood pressure (!) 147/72, pulse 79, temperature (!) 97.5 F (36.4 C), resp. rate 18, height 5' (1.524 m), weight 45.4 kg, SpO2 94%. Body mass index is 19.55 kg/m.  Physical Exam Constitutional:      Appearance: Normal appearance. She is not ill-appearing.  Neurological:     Mental Status: She is alert. Mental status is at baseline.     Mental Status Exam: General Appearance: Casual  Orientation:  Other:  Oriented to place and person only  Memory:  Immediate;   Fair Recent;   Poor Remote;   Poor  Concentration:  Concentration: Fair and Attention Span: Fair  Recall:  Poor  Attention  Fair  Eye Contact:  Fair  Speech:  WDL  Language:  Fair  Volume:  WDL  Mood: denies depression and anxiety  Affect:  Appropriate  Thought Process:  confused at times  Thought Content:  Delusions and Paranoid Ideation  Suicidal Thoughts:  No  Homicidal Thoughts:  No  Judgement:  Impaired  Insight:  Lacking  Psychomotor Activity:  Decreased  Akathisia:  No  Fund of Knowledge:  Poor      Assets:  Communication Skills Housing Social Support  Cognition:   Impaired,  Moderate  ADL's:  Impaired  AIMS (if indicated):        Other History   These have been pulled in through the EMR, reviewed, and updated if appropriate.  Family History:  The patient's family history is not on file.  Medical History: Past Medical History:  Diagnosis Date   Asthma    COPD (chronic obstructive pulmonary disease) (HCC)    Dementia (HCC)    Hypertension    Pneumothorax     Surgical History: Past Surgical History:  Procedure Laterality Date   CHEST TUBE INSERTION     VIDEO ASSISTED THORACOSCOPY Right 02/01/2018   Procedure: VIDEO ASSISTED THORACOSCOPY, BLEB STAPLING;  Surgeon: Fleeta Hanford Coy, MD;  Location: [facility] OR;  Service: Thoracic;  Laterality: Right;     Medications:   Current Facility-Administered Medications:    acetaminophen  (TYLENOL ) tablet 650 mg, 650 mg, Oral, Q6H PRN, 650 mg at 05/21/24 1402 **OR** acetaminophen  (TYLENOL ) suppository 650 mg, 650 mg, Rectal, Q6H PRN, Melvin, Alexander B, MD   OLANZapine  (ZYPREXA ) injection 2.5 mg, 2.5 mg, Intramuscular, BID, 2.5 mg at 05/20/24 1803 **OR** ARIPiprazole  (ABILIFY ) tablet 2 mg, 2 mg, Oral, BID, Randol Zumstein, MD, 2 mg at 05/23/24 0901   ARIPiprazole  ER (ABILIFY  MAINTENA) injection 200 mg, 200 mg, Intramuscular, Q28 days, Larina Fling, MD   enoxaparin  (LOVENOX ) injection 40 mg, 40 mg, Subcutaneous, Q24H, Tariq, Hassan, MD, 40 mg at 05/23/24 1101   feeding supplement (ENSURE PLUS HIGH PROTEIN) liquid 237 mL, 237 mL, Oral, BID BM, Cosette Blackwater, MD, 237 mL at 05/23/24 1100   Gerhardt's butt cream, , Topical, BID, Samtani, Jai-Gurmukh, MD, Given at 05/23/24 0901   multivitamin with minerals tablet 1 tablet, 1 tablet, Oral, Daily, Samtani, Jai-Gurmukh, MD, 1 tablet at 05/23/24 0901   nutrition supplement (JUVEN) (JUVEN) powder packet 1 packet, 1 packet, Oral, BID BM, Tariq, Hassan, MD, 1 packet at 05/17/24 0853   sodium chloride  flush (NS) 0.9 % injection 3 mL, 3 mL, Intravenous, Q12H, Melvin,  Alexander B, MD, 3 mL at 05/22/24 2226  Allergies: Allergies  Allergen Reactions   Aspirin Shortness Of Breath   Shellfish Allergy Anaphylaxis    FLING LARINA, MD

## 2024-05-23 NOTE — Progress Notes (Signed)
 PROGRESS NOTE    Carolyn Huynh  FMW:[id number] DOB: 11-11-1955 DOA: 05/03/2024 PCP: Patient, [name]    Hospital Course: 68 year old female with longstanding schizophrenia, delusional disorder, EtOH abuse with psychiatric hospitalization in 2023, pneumothorax with surgery to left chest in 2000 complicated by spontaneous pneumothoraces managed by Dr. Vantrigt-most recent admission for this 2019 undergoing a right VATS with stapling of blebs and mechanical pleurodesis. Brought to ED from home with daughter increasing weakness, red water  stools. Evaluation in the ED no obvious hemorrhoids had bright red blood per rectum. FOBT was positive. Found to have perianal fistula as likely bleeding source with surrounding soft tissue cellulitis that was treated with antibiotics and local wound care. Seen by gynecologic surgery with no plan for surgical intervention. Patient was seen by GI and refused colonoscopy. Hospital course complicated by stercoral colitis that resolved with bowel regimen, and shizophrenia that is managed with medication adjustments by psychiatry team.     Subjective Today, pt denies any new complaints, eating breakfast     Assessment and Plan:  Perianal fistula Buttock abscess No surgical intervention as per gynecologic surgery Finished 5 days of antibiotics course on 11/28 Continue with local wound care as per wound care team recommendations Will need to follow up with surgery outpatient for perianal fistula closure   Stercoral colitis Resolved with bowel regimen Monitor for regular BMs   Renal cyst Noted on CT abdomen/pelvis to be 2.9cm Will need outpatient workup   Schizophrenia Severe psychosis with lack of capacity to make informed decisions regarding treatment. Persistent refusal of medication places the patient at risk for serious medical complications and further psychiatric deterioration On 12/6, I agreed with psychiatrist Dr. Larina, to proceed with a  nonemergency forced medication order under [place] 122C-57(d) to ensure patient safety and promote psychiatric stabilization.  Plan to continue to monitor for side effects, encourage voluntary participation, and reassess decision-making capacity regularly Cont oral Abilify  (if refusing, consider nonemergency forced medication using low-dose Zyprexa  as per psychiatry) Patient refused long-term IM Abilify  Psychiatry following   Moderate Alzheimer's dementia Delirium precautions   Bright red blood per rectum Repeat FOBT 11/20 negative GI signed off as patient refused colonoscopy and will not drink bowel prep Monitor clinically  DVT prophylaxis: enoxaparin  (LOVENOX ) injection 40 mg Start: 05/13/24 1100 SCDs Start: 05/13/24 1011  Lovenox    Code Status: Full Code Disposition Plan: TBD, likely SNF Reason for continuing need for hospitalization:  medically ready, placement pending, SW following   Objective: Vitals:   05/22/24 2049 05/23/24 0421 05/23/24 0805 05/23/24 1139  BP: 104/67 134/80 (!) 141/84 (!) 147/72  Pulse: 73 75 77 79  Resp: 20 20 16 18   Temp: 98.5 F (36.9 C) (!) 97.4 F (36.3 C) 97.7 F (36.5 C) (!) 97.5 F (36.4 C)  TempSrc: Oral Oral    SpO2: 96% 96% 100% 94%  Weight:      Height:       No intake or output data in the 24 hours ending 05/23/24 1422  Filed Weights   05/05/24 1522 05/11/24 1857 05/16/24 1704  Weight: 47.2 kg 47.2 kg 45.4 kg    Examination:  General: NAD  Cardiovascular: S1, S2 present Respiratory: CTAB Abdomen: Soft, nontender, nondistended, bowel sounds present Musculoskeletal: No bilateral pedal edema noted Skin: Normal Psychiatry: Normal mood  Data Reviewed: I have personally reviewed following labs and imaging studies  CBC: Recent Labs  Lab 05/18/24 0434  WBC 6.4  NEUTROABS 4.4  HGB 11.1*  HCT  34.4*  MCV 91.0  PLT 403*   Basic Metabolic Panel: Recent Labs  Lab 05/18/24 0434 05/20/24 0559  NA 138  --   K 4.1   --   CL 106  --   CO2 27  --   GLUCOSE 85  --   BUN 14  --   CREATININE 0.87 0.70  CALCIUM 8.7*  --    GFR: Estimated Creatinine Clearance: 46.9 mL/min (by C-G formula based on SCr of 0.7 mg/dL). Liver Function Tests: No results for input(s): AST, ALT, ALKPHOS, BILITOT, PROT, ALBUMIN in the last 168 hours.  No results for input(s): LIPASE, AMYLASE in the last 168 hours. No results for input(s): AMMONIA in the last 168 hours. Coagulation Profile: No results for input(s): INR, PROTIME in the last 168 hours. Cardiac Enzymes: No results for input(s): CKTOTAL, CKMB, CKMBINDEX, TROPONINI in the last 168 hours. ProBNP, BNP (last 5 results) No results for input(s): PROBNP, BNP in the last 8760 hours. HbA1C: No results for input(s): HGBA1C in the last 72 hours. CBG: No results for input(s): GLUCAP in the last 168 hours. Lipid Profile: No results for input(s): CHOL, HDL, LDLCALC, TRIG, CHOLHDL, LDLDIRECT in the last 72 hours. Thyroid Function Tests: No results for input(s): TSH, T4TOTAL, FREET4, T3FREE, THYROIDAB in the last 72 hours. Anemia Panel: No results for input(s): VITAMINB12, FOLATE, FERRITIN, TIBC, IRON, RETICCTPCT in the last 72 hours. Sepsis Labs: No results for input(s): PROCALCITON, LATICACIDVEN in the last 168 hours.  No results found for this or any previous visit (from the past 240 hours).   Radiology Studies: No results found.  Scheduled Meds:  OLANZapine   2.5 mg Intramuscular BID   Or   ARIPiprazole   2 mg Oral BID   [START ON 05/24/2024] ARIPiprazole   5 mg Oral Daily   ARIPiprazole  ER  200 mg Intramuscular Q28 days   enoxaparin  (LOVENOX ) injection  40 mg Subcutaneous Q24H   feeding supplement  237 mL Oral BID BM   Gerhardt's butt cream   Topical BID   multivitamin with minerals  1 tablet Oral Daily   nutrition supplement (JUVEN)  1 packet Oral BID BM   sodium chloride  flush  3 mL  Intravenous Q12H   Continuous Infusions:   LOS: 15 days   [name], MD  [facility]  05/23/2024, 2:22 PM

## 2024-05-24 LAB — CBC
HCT: 35.1 % — ABNORMAL LOW (ref 36.0–46.0)
Hemoglobin: 11.2 g/dL — ABNORMAL LOW (ref 12.0–15.0)
MCH: 29.2 pg (ref 26.0–34.0)
MCHC: 31.9 g/dL (ref 30.0–36.0)
MCV: 91.4 fL (ref 80.0–100.0)
Platelets: 331 K/uL (ref 150–400)
RBC: 3.84 MIL/uL — ABNORMAL LOW (ref 3.87–5.11)
RDW: 15 % (ref 11.5–15.5)
WBC: 5.2 K/uL (ref 4.0–10.5)
nRBC: 0 % (ref 0.0–0.2)

## 2024-05-24 LAB — BASIC METABOLIC PANEL WITH GFR
Anion gap: 5 (ref 5–15)
BUN: 12 mg/dL (ref 8–23)
CO2: 29 mmol/L (ref 22–32)
Calcium: 8.5 mg/dL — ABNORMAL LOW (ref 8.9–10.3)
Chloride: 104 mmol/L (ref 98–111)
Creatinine, Ser: 0.78 mg/dL (ref 0.44–1.00)
GFR, Estimated: >60 mL/min (ref >60)
Glucose, Bld: 83 mg/dL (ref 70–99)
Potassium: 3.9 mmol/L (ref 3.5–5.1)
Sodium: 138 mmol/L (ref 135–145)

## 2024-05-24 NOTE — Plan of Care (Signed)

## 2024-05-24 NOTE — Progress Notes (Addendum)
 PROGRESS NOTE   Carolyn Huynh  FMW:[id number]    DOB: [birth year]    DOA: 05/03/2024  PCP: Patient, [name]   I have briefly reviewed patients previous medical records in [facility].   Brief Hospital Course:   68 year old female with medical history significant for hypertension, COPD, dementia, bipolar disorder, longstanding schizophrenia, delusional disorder, alcohol use disorder, behavioral health hospitalization in the past,, recurrent pneumothoraces, alcohol use disorder, diverticulosis, presented to the ED on 05/03/2024 with complaints of weakness, poor oral intake and blood in stools.  [facility] evaluated, no evidence of active GI bleeding, CT A/P showed fecal retention and low-grade bleeding was felt to be due to that and stercoral ulceration versus unclear etiology.  She declined bowel prep, colonoscopy or flexible sigmoidoscopy and GI signed off 11/21.  Completed treatment for perianal fistula/buttock abscess.  Psychiatry continues to closely follow and manage multiple behavioral health issues.   Assessment & Plan:   Constipation/obstipation Fecal impaction/stercoral colitis Low-volume rectal bleeding [facility] was consulted, patient declined bowel prep, colonoscopy or flex sigmoidoscopy.  GI signed off 11/21 Having BMs now, LBM 12/9.  Does not appear to be on any bowel regimen at least since 12/1.  Perianal fistula Buttock abscess Per GYN, no surgical intervention. Completed a course of IV Zosyn  11/23 - 11/27 followed by a dose of Augmentin  on 12/28. Continue local wound care.  WOC RN consulted this admission. Outpatient general surgery follow-up regarding further management of perianal fistula.  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 Psychiatry continue to closely follow and assist with management through the course of this hospital admission. Patient remains on  nonemergency forced medications to ensure safety.  She seems to be taking her oral medications at the current time although inconsistent.  She has refused long-acting injectables. As per psychiatry follow-up 12/9, they have increased Abilify  up to 5 mg once a day (12/10). As per ICM update 12/10, they are coordinating with patient's daughters to see if they are agreeable to patient returning home when she is medically ready.  It appears that family is also working on filing for her guardianship.  SVT Earlier on in hospital stay.  Resolved  2.9 cm renal cyst Noted on CT A/P.  Will need outpatient follow-up and evaluation and management as deemed necessary.  Anemia Stable.  Nutrition Status: Nutrition Problem: Severe Malnutrition Etiology: social / environmental circumstances Signs/Symptoms: energy intake < 75% for > or equal to 3 months, severe muscle depletion, severe fat depletion Interventions: Ensure Enlive (each supplement provides 350kcal and 20 grams of protein), MVI Management as per registered dietitian input.  Body mass index is 19.55 kg/m.   DVT prophylaxis: enoxaparin  (LOVENOX ) injection 40 mg Start: 05/13/24 1100 SCDs Start: 05/13/24 1011     Code Status: Full Code:  Family Communication: None at bedside. Disposition:  Status is: Inpatient Remains inpatient appropriate because: Ongoing psychiatric follow-up with medication adjustments and awaiting safe disposition decision.     Consultants:   Psychiatry [facility] Palliative care medicine  Procedures:     Subjective:  Patient is oriented only to self and hospital.  She was pleasant and interactive but obviously delusional.  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  Denies complaints.  Objective:   Vitals:   05/24/24 0025 05/24/24 0534 05/24/24 0540 05/24/24 0825  BP: 131/89 ROLLEN)  154/92 (!)  142/93 (!) 143/84  Pulse: 73 75 94 80  Resp:    20  Temp: (!) 97.4 F (36.3 C) 98 F (36.7 C) 98.2 F (36.8 C) (!) 97.4 F (36.3 C)  TempSrc:    Oral  SpO2: 92% 95% 98% 99%  Weight:      Height:        General exam: Elderly female, small built, frail and thinly nourished sitting up comfortably in bed without distress. Respiratory system: Clear to auscultation. Respiratory effort normal. Cardiovascular system: S1 & S2 heard, RRR. No JVD, murmurs, rubs, gallops or clicks. No pedal edema. Gastrointestinal system: Abdomen is nondistended, soft and nontender. No organomegaly or masses felt. Normal bowel sounds heard. Central nervous system: Alert and oriented. No focal neurological deficits. Extremities: Symmetric 5 x 5 power. Skin: No rashes, lesions or ulcers Psychiatry: Judgement and insight impaired. Mood & affect pleasant and appropriate.  Ongoing delusions.    Data Reviewed:   I have personally reviewed following labs and imaging studies   CBC: Recent Labs  Lab 05/18/24 0434 05/24/24 0437  WBC 6.4 5.2  NEUTROABS 4.4  --   HGB 11.1* 11.2*  HCT 34.4* 35.1*  MCV 91.0 91.4  PLT 403* 331    Basic Metabolic Panel: Recent Labs  Lab 05/18/24 0434 05/20/24 0559 05/24/24 0437  NA 138  --  138  K 4.1  --  3.9  CL 106  --  104  CO2 27  --  29  GLUCOSE 85  --  83  BUN 14  --  12  CREATININE 0.87 0.70 0.78  CALCIUM 8.7*  --  8.5*    Liver Function Tests: No results for input(s): AST, ALT, ALKPHOS, BILITOT, PROT, ALBUMIN in the last 168 hours.  CBG: No results for input(s): GLUCAP in the last 168 hours.  Microbiology Studies:  No results found for this or any previous visit (from the past 240 hours).  Radiology Studies:  No results found.  Scheduled Meds:    ARIPiprazole   5 mg Oral Daily   ARIPiprazole  ER  200 mg Intramuscular Q28 days   enoxaparin  (LOVENOX ) injection  40 mg Subcutaneous Q24H   feeding supplement  237 mL Oral BID BM    Gerhardt's butt cream   Topical BID   multivitamin with minerals  1 tablet Oral Daily   nutrition supplement (JUVEN)  1 packet Oral BID BM   sodium chloride  flush  3 mL Intravenous Q12H    Continuous Infusions:     LOS: 16 days     Trenda Mar, MD,  FACP, [facility], [facility], [facility]   [facility] [place]      To contact the attending provider between 7A-7P or the covering provider during after hours 7P-7A, please log into the web site [email or web address] and access using universal [place] password for that web site. If you do not have the password, please call the hospital operator.  05/24/2024, 12:47 PM

## 2024-05-25 MED ORDER — GUAIFENESIN-DM 100-10 MG/5ML PO SYRP
5.0000 mL | ORAL_SOLUTION | ORAL | Status: DC | PRN
  Administered 2024-05-25: 5 mL via ORAL
  Filled 2024-05-25: qty 10

## 2024-05-25 NOTE — Plan of Care (Signed)
   Problem: Activity: Goal: Risk for activity intolerance will decrease Outcome: Progressing   Problem: Nutrition: Goal: Adequate nutrition will be maintained Outcome: Progressing   Problem: Coping: Goal: Level of anxiety will decrease Outcome: Progressing   Problem: Elimination: Goal: Will not experience complications related to bowel motility Outcome: Progressing Goal: Will not experience complications related to urinary retention Outcome: Progressing

## 2024-05-25 NOTE — Progress Notes (Signed)
 Day shift RN stated the patient was having difficulty with placement as requested by Psychiatry due to missing appointments and not receiving medications at home.   Patient has generally been very agreeable as I have noticed throughout her hospitalization. I asked patient about her home habits and taking her medications as prescribed and she stated I don't need to take anything. I advised she needs some medications that were recently changed by Psychiatry and she was immediately resistive and seemed agitated it was brought up despite being followed and having recent medication changes very recently by their team.   I did not press any further, but it does seem the patient may not be safe for home discharge due to refusal or stigma of Psychiatry diagnosis or medications. Patient has had no issue taking her PO medications when I or the day shift RN have offered them but it is also my opinion that she would be highly resistive of her family members as I was told.

## 2024-05-25 NOTE — Progress Notes (Signed)
 PROGRESS NOTE   Carolyn Huynh  FMW:[id number]    DOB: Feb 09, 1956    DOA: 05/03/2024  PCP: Patient, [name]   I have briefly reviewed patients previous medical records in [facility]   Brief Hospital Course:   68 year old female with medical history significant for hypertension, COPD, dementia, bipolar disorder, longstanding schizophrenia, delusional disorder, alcohol use disorder, behavioral health hospitalization in the past,, recurrent pneumothoraces, alcohol use disorder, diverticulosis, presented to the ED on 05/03/2024 with complaints of weakness, poor oral intake and blood in stools.  [facility] evaluated, no evidence of active GI bleeding, CT A/P showed fecal retention and low-grade bleeding was felt to be due to that and stercoral ulceration versus unclear etiology.  She declined bowel prep, colonoscopy or flexible sigmoidoscopy and GI signed off 11/21.  Completed treatment for perianal fistula/buttock abscess.  Psychiatry continues to closely follow and manage multiple behavioral health issues.  Awaiting safe disposition-see below.   Assessment & Plan:   Constipation/obstipation Fecal impaction/stercoral colitis Low-volume rectal bleeding [employer] [facility] was consulted, patient declined bowel prep, colonoscopy or flex sigmoidoscopy.  GI signed off 11/21 Having BMs now, LBM 12/9.  Does not appear to be on any bowel regimen at least since 12/1.  Perianal fistula Buttock abscess Per GYN, no surgical intervention. Completed a course of IV Zosyn  11/23 - 11/27 followed by a dose of Augmentin  on 12/28. Continue local wound care.  WOC RN consulted this admission. Outpatient general surgery follow-up regarding further management of perianal fistula.  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 Psychiatry continue to closely follow and assist with management through the course of this hospital  admission. Patient remains on nonemergency forced medications to ensure safety.  She seems to be taking her oral medications at the current time although inconsistent.  She has refused long-acting injectables. As per psychiatry follow-up 12/9, they have increased Abilify  up to 5 mg once a day (12/10). As per ICM update 12/10, they are coordinating with patient's daughters to see if they are agreeable to patient returning home when she is medically ready.  It appears that family is also working on filing for her guardianship.  SVT Earlier on in hospital stay.  Resolved  2.9 cm renal cyst Noted on CT A/P.  Will need outpatient follow-up and evaluation and management as deemed necessary.  Anemia Stable.  Nutrition Status: Nutrition Problem: Severe Malnutrition Etiology: social / environmental circumstances Signs/Symptoms: energy intake < 75% for > or equal to 3 months, severe muscle depletion, severe fat depletion Interventions: Ensure Enlive (each supplement provides 350kcal and 20 grams of protein), MVI Management as per registered dietitian input.  Body mass index is 19.55 kg/m.   DVT prophylaxis: enoxaparin  (LOVENOX ) injection 40 mg Start: 05/13/24 1100 SCDs Start: 05/13/24 1011     Code Status: Full Code:  Family Communication: None at bedside. Disposition:  Status is: Inpatient Remains inpatient appropriate because: Ongoing psychiatric follow-up with medication adjustments and awaiting safe disposition decision.  Discussed in detail with TOC on 12/11.  They last communicated with family/daughters November and at which time daughters were agreeable to taking patient back home.  Requested TOC to reach back out to family to see if that still is the plan.  Also there were overall concerns that if patient returns home, she will continue to refuse medications, care, MD visits like she has in the past and regress again.  Barrier to SNF placement currently is inadequate insurance.  Difficult situation.     Consultants:   Psychiatry [facility] Palliative care medicine  Procedures:     Subjective:  Seen this morning along with her female RN at bedside in the room.  Patient reported that she felt great after taking a shower in the morning.  No complaints reported.  Per RN, no acute issues.  Objective:   Vitals:   05/25/24 0043 05/25/24 0432 05/25/24 0739 05/25/24 1144  BP: (!) 140/90 (!) 141/82 (!) 148/77 125/68  Pulse: 73 78 82 84  Resp: 20 20 20 20   Temp: 97.6 F (36.4 C) 97.7 F (36.5 C) 98.2 F (36.8 C) 97.7 F (36.5 C)  TempSrc: Oral Oral Oral Oral  SpO2: 98% 98% 95% 92%  Weight:      Height:       Stable and unchanged exam compared to 12/10  General exam: Elderly female, small built, frail and thinly nourished sitting up comfortably in bed without distress. Respiratory system: Clear to auscultation. Respiratory effort normal. Cardiovascular system: S1 & S2 heard, RRR. No JVD, murmurs, rubs, gallops or clicks. No pedal edema.  Not on telemetry. Gastrointestinal system: Abdomen is nondistended, soft and nontender. No organomegaly or masses felt. Normal bowel sounds heard. Central nervous system: Alert and oriented. No focal neurological deficits. Extremities: Symmetric 5 x 5 power. Skin: No rashes, lesions or ulcers Psychiatry: Judgement and insight impaired. Mood & affect pleasant and appropriate.  Ongoing delusions.    Data Reviewed:   I have personally reviewed following labs and imaging studies   CBC: Recent Labs  Lab 05/24/24 0437  WBC 5.2  HGB 11.2*  HCT 35.1*  MCV 91.4  PLT 331    Basic Metabolic Panel: Recent Labs  Lab 05/20/24 0559 05/24/24 0437  NA  --  138  K  --  3.9  CL  --  104  CO2  --  29  GLUCOSE  --  83  BUN  --  12  CREATININE 0.70 0.78  CALCIUM  --  8.5*    Liver Function Tests: No results for input(s): AST, ALT, ALKPHOS, BILITOT, PROT, ALBUMIN in the last 168 hours.  CBG: No  results for input(s): GLUCAP in the last 168 hours.  Microbiology Studies:  No results found for this or any previous visit (from the past 240 hours).  Radiology Studies:  No results found.  Scheduled Meds:    ARIPiprazole   5 mg Oral Daily   ARIPiprazole  ER  200 mg Intramuscular Q28 days   enoxaparin  (LOVENOX ) injection  40 mg Subcutaneous Q24H   feeding supplement  237 mL Oral BID BM   Gerhardt's butt cream   Topical BID   multivitamin with minerals  1 tablet Oral Daily   nutrition supplement (JUVEN)  1 packet Oral BID BM   sodium chloride  flush  3 mL Intravenous Q12H    Continuous Infusions:     LOS: 17 days     Trenda Mar, MD,  FACP, [facility], [facility], [facility]   [facility] [place]      To contact the attending provider between 7A-7P or the covering provider during after hours 7P-7A, please log into the web site [email or web address] and access using universal [place] password for that web site. If you do not have the password, please call the hospital operator.  05/25/2024, 2:17 PM

## 2024-05-25 NOTE — Plan of Care (Signed)

## 2024-05-26 NOTE — Plan of Care (Signed)
   Problem: Activity: Goal: Risk for activity intolerance will decrease Outcome: Progressing   Problem: Nutrition: Goal: Adequate nutrition will be maintained Outcome: Progressing   Problem: Coping: Goal: Level of anxiety will decrease Outcome: Progressing   Problem: Safety: Goal: Ability to remain free from injury will improve Outcome: Progressing   Problem: Skin Integrity: Goal: Risk for impaired skin integrity will decrease Outcome: Progressing

## 2024-05-26 NOTE — Plan of Care (Signed)

## 2024-05-26 NOTE — Progress Notes (Signed)
 PROGRESS NOTE   Carolyn Huynh  FMW:[id number]    DOB: [birth year]    DOA: 05/03/2024  PCP: Patient, [name]   I have briefly reviewed patients previous medical records in [facility].   Brief Hospital Course:   68 year old female with medical history significant for hypertension, COPD, dementia, bipolar disorder, longstanding schizophrenia, delusional disorder, alcohol use disorder, behavioral health hospitalization in the past,, recurrent pneumothoraces, alcohol use disorder, diverticulosis, presented to the ED on 05/03/2024 with complaints of weakness, poor oral intake and blood in stools.  [facility] evaluated, no evidence of active GI bleeding, CT A/P showed fecal retention and low-grade bleeding was felt to be due to that and stercoral ulceration versus unclear etiology.  She declined bowel prep, colonoscopy or flexible sigmoidoscopy and GI signed off 11/21.  Completed treatment for perianal fistula/buttock abscess.  Psychiatry continues to closely follow and manage multiple behavioral health issues.  Awaiting safe disposition-see below.   Assessment & Plan:   Constipation/obstipation Fecal impaction/stercoral colitis Low-volume rectal bleeding  GI was consulted, patient declined bowel prep, colonoscopy or flex sigmoidoscopy.  GI signed off 11/21 Having BMs now, LBM 12/9.  Does not appear to be on any bowel regimen at least since 12/1.  Perianal fistula Buttock abscess Per GYN, no surgical intervention. Completed a course of IV Zosyn  11/23 - 11/27 followed by a dose of Augmentin  on 12/28. Continue local wound care.  WOC RN consulted this admission. Outpatient general surgery follow-up regarding further management of perianal fistula.  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 Psychiatry continue to closely follow and assist with management through the course of this hospital  admission. Patient remains on nonemergency forced medications to ensure safety.  She seems to be taking her oral medications at the current time although inconsistent.  She has refused long-acting injectables. As per psychiatry follow-up 12/9, they have increased Abilify  up to 5 mg once a day (12/10). As per ICM update 12/10, they are coordinating with patient's daughters to see if they are agreeable to patient returning home when she is medically ready.  It appears that family is also working on filing for her guardianship.  SVT Earlier on in hospital stay.  Resolved  2.9 cm renal cyst Noted on CT A/P.  Will need outpatient follow-up and evaluation and management as deemed necessary.  Anemia Stable.  Nutrition Status: Nutrition Problem: Severe Malnutrition Etiology: social / environmental circumstances Signs/Symptoms: energy intake < 75% for > or equal to 3 months, severe muscle depletion, severe fat depletion Interventions: Ensure Enlive (each supplement provides 350kcal and 20 grams of protein), MVI Management as per registered dietitian input.  Body mass index is 19.55 kg/m.   DVT prophylaxis: enoxaparin  (LOVENOX ) injection 40 mg Start: 05/13/24 1100 SCDs Start: 05/13/24 1011     Code Status: Full Code:  Family Communication: None at bedside. Disposition:  Status is: Inpatient Remains inpatient appropriate because: Ongoing psychiatric follow-up with medication adjustments and awaiting safe disposition decision.  Discussed in detail with TOC on 12/11.  They last communicated with family/daughters November and at which time daughters were agreeable to taking patient back home.  Requested TOC to reach back out to family to see if that still is the plan.  Also there were overall concerns that if patient returns home, she will continue to refuse medications, care, MD visits like she has in the past and regress again.  Barrier to SNF placement currently is inadequate insurance.  Difficult situation.  Discussed with TOC, they left a message for patient's family yesterday but have not heard back.     Consultants:   Psychiatry [facility] Palliative care medicine  Procedures:     Subjective:  Patient paranoid this morning, stating that she was shot up, when asked to clarify that she meant drugs in her right arm vein while she was in the bathroom.  Objective:   Vitals:   05/25/24 1546 05/25/24 2117 05/26/24 0518 05/26/24 0748  BP: 128/79 (!) 138/92 (!) 142/95 (!) 149/94  Pulse: 79 79 72 78  Resp: 20   20  Temp: (!) 97.3 F (36.3 C) 98.1 F (36.7 C) 97.9 F (36.6 C) 97.8 F (36.6 C)  TempSrc: Oral   Oral  SpO2: 94% 94% 93% 97%  Weight:      Height:        General exam: Elderly female, small built, frail and thinly nourished sitting up comfortably in bed without distress.  Having breakfast.  Smiling.  Then she proceeded to show me on the right cubital fossa with a bulging vein but no obvious needlestick. Respiratory system: Clear to auscultation. Respiratory effort normal. Cardiovascular system: S1 & S2 heard, RRR. No JVD, murmurs, rubs, gallops or clicks. No pedal edema.  Not on telemetry. Gastrointestinal system: Abdomen is nondistended, soft and nontender. No organomegaly or masses felt. Normal bowel sounds heard. Central nervous system: Alert and oriented. No focal neurological deficits. Extremities: Symmetric 5 x 5 power. Skin: No rashes, lesions or ulcers Psychiatry: Judgement and insight impaired. Mood & affect pleasant and appropriate.  Ongoing delusions.    Data Reviewed:   I have personally reviewed following labs and imaging studies   CBC: Recent Labs  Lab 05/24/24 0437  WBC 5.2  HGB 11.2*  HCT 35.1*  MCV 91.4  PLT 331    Basic Metabolic Panel: Recent Labs  Lab 05/20/24 0559 05/24/24 0437  NA  --  138  K  --  3.9  CL  --  104  CO2  --  29  GLUCOSE  --  83  BUN  --  12  CREATININE 0.70 0.78  CALCIUM  --   8.5*    Liver Function Tests: No results for input(s): AST, ALT, ALKPHOS, BILITOT, PROT, ALBUMIN in the last 168 hours.  CBG: No results for input(s): GLUCAP in the last 168 hours.  Microbiology Studies:  No results found for this or any previous visit (from the past 240 hours).  Radiology Studies:  No results found.  Scheduled Meds:    ARIPiprazole   5 mg Oral Daily   ARIPiprazole  ER  200 mg Intramuscular Q28 days   enoxaparin  (LOVENOX ) injection  40 mg Subcutaneous Q24H   feeding supplement  237 mL Oral BID BM   Gerhardt's butt cream   Topical BID   multivitamin with minerals  1 tablet Oral Daily   nutrition supplement (JUVEN)  1 packet Oral BID BM   sodium chloride  flush  3 mL Intravenous Q12H    Continuous Infusions:     LOS: 18 days     Trenda Mar, MD,  FACP, [facility], [facility], [facility]   [facility] [place]      To contact the attending provider between 7A-7P or the covering provider during after hours 7P-7A, please log into the web site [email or web address] and access using universal [place] password for that web site. If you do not have the password, please call the hospital operator.  05/26/2024, 2:05 PM

## 2024-05-27 LAB — CREATININE, SERUM
Creatinine, Ser: 0.67 mg/dL (ref 0.44–1.00)
GFR, Estimated: >60 mL/min (ref >60)

## 2024-05-27 NOTE — Plan of Care (Signed)
  Problem: Coping: Goal: Level of anxiety will decrease Outcome: Progressing   Problem: Pain Managment: Goal: General experience of comfort will improve and/or be controlled Outcome: Progressing   Problem: Education: Goal: Knowledge of General Education information will improve Description: Including pain rating scale, medication(s)/side effects and non-pharmacologic comfort measures Outcome: Not Progressing

## 2024-05-27 NOTE — Progress Notes (Signed)
 PROGRESS NOTE    Carolyn Huynh  FMW:[id number]  DOB: 1955-09-28  DOA: 05/03/2024 PCP: Patient, [name] Outpatient Specialists:   Hospital course:  68 year old female with medical history significant for hypertension, COPD, dementia, bipolar disorder, longstanding schizophrenia, delusional disorder, alcohol use disorder, behavioral health hospitalization in the past,, recurrent pneumothoraces, alcohol use disorder, diverticulosis, presented to the ED on 05/03/2024 with complaints of weakness, poor oral intake and blood in stools. [facility] evaluated, no evidence of active GI bleeding, CT A/P showed fecal retention and low-grade bleeding was felt to be due to that and stercoral ulceration versus unclear etiology. She declined bowel prep, colonoscopy or flexible sigmoidoscopy and GI signed off 11/21. Completed treatment for perianal fistula/buttock abscess. Psychiatry continues to closely follow and manage multiple behavioral health issues.   Subjective:  Patient states she is doing okay.  Denies any pain or constipation, states she moved her bowels yesterday.   Objective: Vitals:   05/26/24 2107 05/27/24 0033 05/27/24 0455 05/27/24 0742  BP: (!) 147/86 (!) 160/92 (!) 149/84 (!) 149/89  Pulse: 79 75 74 80  Resp:  18 18 20   Temp: 98.2 F (36.8 C) 97.9 F (36.6 C) (!) 97.5 F (36.4 C) 98.2 F (36.8 C)  TempSrc:  Oral Oral Oral  SpO2: 95% 94% 97% 96%  Weight:      Height:        Intake/Output Summary (Last 24 hours) at 05/27/2024 1325 Last data filed at 05/26/2024 8161 Gross per 24 hour  Intake 418 ml  Output --  Net 418 ml   Filed Weights   05/05/24 1522 05/11/24 1857 05/16/24 1704  Weight: 47.2 kg 47.2 kg 45.4 kg     Exam:  General: Thin female sitting up in bed, awake, NAD Eyes: sclera anicteric, conjuctiva mild injection bilaterally CVS: S1-S2, regular  Respiratory:  decreased air entry bilaterally secondary to decreased inspiratory effort, rales at bases   GI: NABS, soft, NT  LE: Warm and well-perfused Neuro: grossly nonfocal.   Data Reviewed:  Basic Metabolic Panel: Recent Labs  Lab 05/24/24 0437 05/27/24 0516  NA 138  --   K 3.9  --   CL 104  --   CO2 29  --   GLUCOSE 83  --   BUN 12  --   CREATININE 0.78 0.67  CALCIUM 8.5*  --     CBC: Recent Labs  Lab 05/24/24 0437  WBC 5.2  HGB 11.2*  HCT 35.1*  MCV 91.4  PLT 331     Scheduled Meds:  ARIPiprazole   5 mg Oral Daily   ARIPiprazole  ER  200 mg Intramuscular Q28 days   enoxaparin  (LOVENOX ) injection  40 mg Subcutaneous Q24H   feeding supplement  237 mL Oral BID BM   Gerhardt's butt cream   Topical BID   multivitamin with minerals  1 tablet Oral Daily   nutrition supplement (JUVEN)  1 packet Oral BID BM   sodium chloride  flush  3 mL Intravenous Q12H   Continuous Infusions:   Assessment & Plan:   Fecal impaction/stercoral colitis Low-volume rectal bleeding Patient states she denies constipation, states she had a bowel movement yesterday Patient has previously declined colonoscopy, declines bowel prep H&H has been stable for over a month  Paranoid schizophrenia Dementia, vascular versus Alzheimer's Does not have decisional capacity Continue Abilify  per psychiatry recommendations, last seen by them 12/9 Patient has been on nonemergency forced medications to ensure safety  Disposition Per previous note Dr. Judeth rom yesterday  As per ICM update 12/10, they are coordinating with patient's daughters to see if they are agreeable to patient returning home when she is medically ready.  It appears that family is also working on filing for her guardianship.  Copied and pasted from previous notes: SVT--resolved   2.9 cm renal cyst Noted on CT A/P.   Will need outpatient follow-up and evaluation     Nutrition Status: Nutrition Problem: Severe Malnutrition Etiology: social / environmental circumstances Signs/Symptoms: energy intake < 75% for > or equal to 3  months, severe muscle depletion, severe fat depletion Interventions: Ensure Enlive (each supplement provides 350kcal and 20 grams of protein), MVI Management as per registered dietitian input.   Body mass index is 19.55 kg/m.     DVT prophylaxis: Lovenox  Code Status: Full Family Communication: None today     Studies: No results found.  Principal Problem:   Acute GI bleeding Active Problems:   Recurrent spontaneous pneumothorax   Schizophrenia, paranoid type (HCC)   Mixed Alzheimer's and vascular dementia (HCC)   Severe manic bipolar 1 disorder with psychotic behavior (HCC)   Vascular dementia with delusions (HCC)   Hypertension   Severe alcohol use disorder, in sustained remission (HCC)   Constipation   Heme positive stool   Abdominal cramping   Protein-calorie malnutrition, severe   Rectal bleeding   Rectal fistula   Pressure injury of skin     Carolyn Huynh, [facility]  If 7PM-7AM, please contact night-coverage [email or web address]   LOS: 19 days

## 2024-05-28 NOTE — Plan of Care (Signed)
°  Problem: Elimination: Goal: Will not experience complications related to urinary retention Outcome: Progressing   Problem: Pain Managment: Goal: General experience of comfort will improve and/or be controlled 05/28/2024 0509 by Dreama Bernardino SAUNDERS, RN Outcome: Progressing 05/28/2024 0506 by Dreama Bernardino SAUNDERS, RN Outcome: Progressing   Problem: Education: Goal: Knowledge of General Education information will improve Description: Including pain rating scale, medication(s)/side effects and non-pharmacologic comfort measures 05/28/2024 0509 by Dreama Bernardino SAUNDERS, RN Outcome: Not Progressing 05/28/2024 0504 by Dreama Bernardino SAUNDERS, RN Outcome: Not Progressing   Problem: Health Behavior/Discharge Planning: Goal: Ability to manage health-related needs will improve Outcome: Not Progressing   Problem: Clinical Measurements: Goal: Ability to maintain clinical measurements within normal limits will improve Outcome: Not Progressing   Problem: Safety: Goal: Ability to remain free from injury will improve Outcome: Not Progressing

## 2024-05-28 NOTE — Plan of Care (Signed)

## 2024-05-28 NOTE — Progress Notes (Signed)
 PROGRESS NOTE    Carolyn Huynh  FMW:[id number]  DOB: Dec 19, 1955  DOA: 05/03/2024 PCP: Patient, [name] Outpatient Specialists:   Hospital course:  68 year old female with medical history significant for hypertension, COPD, dementia, bipolar disorder, longstanding schizophrenia, delusional disorder, alcohol use disorder, behavioral health hospitalization in the past,, recurrent pneumothoraces, alcohol use disorder, diverticulosis, presented to the ED on 05/03/2024 with complaints of weakness, poor oral intake and blood in stools. [facility] evaluated, no evidence of active GI bleeding, CT A/P showed fecal retention and low-grade bleeding was felt to be due to that and stercoral ulceration versus unclear etiology. She declined bowel prep, colonoscopy or flexible sigmoidoscopy and GI signed off 11/21. Completed treatment for perianal fistula/buttock abscess. Psychiatry continues to closely follow and manage multiple behavioral health issues.   Subjective:  Patient states she is doing fine, moved her bowels this morning.   Objective: Vitals:   05/28/24 0028 05/28/24 0545 05/28/24 0806 05/28/24 1225  BP: (!) 141/87 130/89 (!) 145/86 (!) 146/85  Pulse: 81 76 92 77  Resp:   20 (!) 8  Temp: 97.8 F (36.6 C) 98.1 F (36.7 C) 98 F (36.7 C) (!) 97.4 F (36.3 C)  TempSrc: Oral  Oral   SpO2: 94% 95% 94% 94%  Weight:      Height:       No intake or output data in the 24 hours ending 05/28/24 1518  Filed Weights   05/05/24 1522 05/11/24 1857 05/16/24 1704  Weight: 47.2 kg 47.2 kg 45.4 kg     Exam:  General: Thin female sitting up in bed, awake, NAD Eyes: sclera anicteric, conjuctiva mild injection bilaterally CVS: S1-S2, regular  Respiratory:  decreased air entry bilaterally secondary to decreased inspiratory effort, rales at bases  GI: NABS, soft, NT  LE: Warm and well-perfused Neuro: grossly nonfocal.   Data Reviewed:  Basic Metabolic Panel: Recent Labs  Lab  05/24/24 0437 05/27/24 0516  NA 138  --   K 3.9  --   CL 104  --   CO2 29  --   GLUCOSE 83  --   BUN 12  --   CREATININE 0.78 0.67  CALCIUM 8.5*  --     CBC: Recent Labs  Lab 05/24/24 0437  WBC 5.2  HGB 11.2*  HCT 35.1*  MCV 91.4  PLT 331     Scheduled Meds:  ARIPiprazole   5 mg Oral Daily   ARIPiprazole  ER  200 mg Intramuscular Q28 days   enoxaparin  (LOVENOX ) injection  40 mg Subcutaneous Q24H   feeding supplement  237 mL Oral BID BM   Gerhardt's butt cream   Topical BID   multivitamin with minerals  1 tablet Oral Daily   nutrition supplement (JUVEN)  1 packet Oral BID BM   sodium chloride  flush  3 mL Intravenous Q12H   Continuous Infusions:   Assessment & Plan:   Fecal impaction/stercoral colitis Low-volume rectal bleeding Patient states she denies constipation, states she had a bowel movement this morning Patient has previously declined colonoscopy, declines bowel prep H&H has been stable for over a month  Paranoid schizophrenia Dementia, vascular versus Alzheimer's Does not have decisional capacity Continue Abilify  per psychiatry recommendations, last seen by them 12/9 Patient has been on nonemergency forced medications to ensure safety  Disposition Per previous note Dr. Judeth rom yesterday As per ICM update 12/10, they are coordinating with patient's daughters to see if they are agreeable to patient returning home when she is medically  ready.  It appears that family is also working on filing for her guardianship.  Copied and pasted from previous notes: SVT--resolved   2.9 cm renal cyst Noted on CT A/P.   Will need outpatient follow-up and evaluation     Nutrition Status: Nutrition Problem: Severe Malnutrition Etiology: social / environmental circumstances Signs/Symptoms: energy intake < 75% for > or equal to 3 months, severe muscle depletion, severe fat depletion Interventions: Ensure Enlive (each supplement provides 350kcal and 20 grams of  protein), MVI Management as per registered dietitian input.   Body mass index is 19.55 kg/m.     DVT prophylaxis: Lovenox  Code Status: Full Family Communication: None today     Studies: No results found.  Principal Problem:   Acute GI bleeding Active Problems:   Recurrent spontaneous pneumothorax   Schizophrenia, paranoid type (HCC)   Mixed Alzheimer's and vascular dementia (HCC)   Severe manic bipolar 1 disorder with psychotic behavior (HCC)   Vascular dementia with delusions (HCC)   Hypertension   Severe alcohol use disorder, in sustained remission (HCC)   Constipation   Heme positive stool   Abdominal cramping   Protein-calorie malnutrition, severe   Rectal bleeding   Rectal fistula   Pressure injury of skin     Carolyn Huynh, [facility]  If 7PM-7AM, please contact night-coverage [email or web address]   LOS: 20 days

## 2024-05-29 NOTE — TOC Progression Note (Signed)
 Transition of Care [facility]) - Progression Note    Patient Details  Name: Carolyn Huynh MRN: [id number] Date of Birth: 1956/03/19  [facility] of [facility] CM/SW Contact  Sherline Clack, [place] Phone Number: 05/29/2024, 5:13 PM  Clinical Narrative:     CSW spoke with daughter, [name], regarding discharge plan. [name] agreeable to patient going home. [name] requested transportation be set up so that [name] and the patient don't have to ride the bus to [employer]. CSW will set up taxi transportation for patient. CSW also reached out to [place] to provide an update. CSW will continue to follow. Provider aware.   Expected Discharge Plan: Skilled Nursing Facility Barriers to Discharge:  (Inpatient Pending Psych Consult)               Expected Discharge Plan and Services                                               Social Drivers of Health (SDOH) Interventions SDOH Screenings   Food Insecurity: No Food Insecurity (05/03/2024)  Housing: Low Risk (05/03/2024)  Transportation Needs: Unmet Transportation Needs (05/03/2024)  Utilities: Not At Risk (05/03/2024)  Alcohol Screen: Low Risk (09/09/2021)  Social Connections: Unknown (05/03/2024)  Tobacco Use: Low Risk (05/12/2024)    Readmission Risk Interventions    12/15/2023    4:42 PM  Readmission Risk Prevention Plan  Post Dischage Appt Complete  Medication Screening Complete  Transportation Screening Complete

## 2024-05-29 NOTE — Progress Notes (Signed)
 PROGRESS NOTE    Carolyn Huynh  FMW:[id number] DOB: 1955/07/07 DOA: 05/03/2024 PCP: Patient, [name]    Brief Narrative:   68 year old female with medical history significant for hypertension, COPD, dementia, bipolar disorder, longstanding schizophrenia, delusional disorder, alcohol use disorder, behavioral health hospitalization in the past,, recurrent pneumothoraces, alcohol use disorder, diverticulosis, presented to the ED on 05/03/2024 with complaints of weakness, poor oral intake and blood in stools. [facility] evaluated, no evidence of active GI bleeding, CT A/P showed fecal retention and low-grade bleeding was felt to be due to that and stercoral ulceration versus unclear etiology. She declined bowel prep, colonoscopy or flexible sigmoidoscopy and GI signed off 11/21. Completed treatment for perianal fistula/buttock abscess. Psychiatry continues to closely follow and manage multiple behavioral health issues.   Assessment & Plan:     Constipation/obstipation Fecal impaction/stercoral colitis Low-volume rectal bleeding [facility] was consulted, patient declined bowel prep, colonoscopy or flex sigmoidoscopy.  GI signed off 11/21.  Continue to monitor as she appears to be having bowel movements.   Perianal fistula Buttock abscess Per GYN, no surgical intervention.  She has completed course of Zosyn  and now on Augmentin  EOT 12/28.  Local wound care.  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 Seen by psychiatry on Abilify  5 mg daily. Psychiatry, family and TOC working on safe disposition   SVT Earlier on in hospital stay.  Resolved   2.9 cm renal cyst Noted on CT A/P.  Will need outpatient follow-up and evaluation and management as deemed necessary.   Anemia Stable.   Nutrition Status: Nutrition Problem: Severe Malnutrition Etiology:  social / environmental circumstances Signs/Symptoms: energy intake < 75% for > or equal to 3 months, severe muscle depletion, severe fat depletion Interventions: Ensure Enlive (each supplement provides 350kcal and 20 grams of protein), MVI Management as per registered dietitian input.   Body mass index is 19.55 kg/m.     DVT prophylaxis: enoxaparin  (LOVENOX ) injection 40 mg Start: 05/13/24 1100 SCDs Start: 05/13/24 1011     Code Status: Full Code:  Family Communication: None at bedside. Disposition:  Status is: Inpatient Remains inpatient appropriate because: Ongoing psychiatric follow-up with medication adjustments and awaiting safe disposition decision.   Discussed in detail with TOC on 12/11.  They last communicated with family/daughters November and at which time daughters were agreeable to taking patient back home.  Requested TOC to reach back out to family to see if that still is the plan.  Also there were overall concerns that if patient returns home, she will continue to refuse medications, care, MD visits like she has in the past and regress again.  Barrier to SNF placement currently is inadequate insurance.  Difficult situation.  Discussed with TOC, they left a message for patient's family yesterday but have not heard back.   PT Follow up Recs:   Subjective: No complaints doing okay   Examination:  General exam: Appears calm and comfortable  Respiratory system: Clear to auscultation. Respiratory effort normal. Cardiovascular system: S1 & S2 heard, RRR. No JVD, murmurs, rubs, gallops or clicks. No pedal edema. Gastrointestinal system: Abdomen is nondistended, soft and nontender. No organomegaly or masses felt. Normal bowel sounds heard. Central nervous system: Alert and oriented. No focal neurological deficits. Extremities: Symmetric 5 x 5 power. 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 (Active)     Diet Orders (From admission, onward)     Start     Ordered   05/05/24 1337  Diet regular Room service appropriate? Yes; Fluid consistency: Thin  Diet effective now       Question Answer Comment  Room service appropriate? Yes   Fluid consistency: Thin      05/05/24 1337            Objective: Vitals:   05/28/24 1928 05/29/24 0439 05/29/24 0923 05/29/24 1142  BP: (!) 152/77 (!) 150/81 (!) 146/82 138/86  Pulse: 86 67 81 76  Resp:      Temp: 97.9 F (36.6 C) 97.8 F (36.6 C) (!) 97.5 F (36.4 C)   TempSrc: Oral     SpO2: 91% (!) 85% 97% 95%  Weight:      Height:       No intake or output data in the 24 hours ending 05/29/24 1221 Filed Weights   05/05/24 1522 05/11/24 1857 05/16/24 1704  Weight: 47.2 kg 47.2 kg 45.4 kg    Scheduled Meds:  ARIPiprazole   5 mg Oral Daily   ARIPiprazole  ER  200 mg Intramuscular Q28 days   enoxaparin  (LOVENOX ) injection  40 mg Subcutaneous Q24H   feeding supplement  237 mL Oral BID BM   Gerhardt's butt cream   Topical BID   multivitamin with minerals  1 tablet Oral Daily   nutrition supplement (JUVEN)  1 packet Oral BID BM   sodium chloride  flush  3 mL Intravenous Q12H   Continuous Infusions:  Nutritional status Signs/Symptoms: energy intake < 75% for > or equal to 3 months, severe muscle depletion, severe fat depletion Interventions: Ensure Enlive (each supplement provides 350kcal and 20 grams of protein), MVI Body mass index is 19.55 kg/m.  Data Reviewed:   CBC: Recent Labs  Lab 05/24/24 0437  WBC 5.2  HGB 11.2*  HCT 35.1*  MCV 91.4  PLT 331   Basic Metabolic Panel: Recent Labs  Lab 05/24/24 0437 05/27/24 0516  NA 138  --   K 3.9  --   CL 104  --   CO2 29  --   GLUCOSE 83  --   BUN 12  --   CREATININE 0.78 0.67  CALCIUM 8.5*  --    GFR: Estimated Creatinine Clearance: 46.9 mL/min (by C-G formula  based on SCr of 0.67 mg/dL). Liver Function Tests: No results for input(s): AST, ALT, ALKPHOS, BILITOT, PROT, ALBUMIN in the last 168 hours. No results for input(s): LIPASE, AMYLASE in the last 168 hours. No results for input(s): AMMONIA in the last 168 hours. Coagulation Profile: No results for input(s): INR, PROTIME in the last 168 hours. Cardiac Enzymes: No results for input(s): CKTOTAL, CKMB, CKMBINDEX, TROPONINI in the last 168 hours. BNP (last 3 results) No results for input(s): PROBNP in the last 8760 hours. HbA1C: No results for input(s): HGBA1C in the last 72 hours. CBG: No results for input(s): GLUCAP in the last 168 hours. Lipid Profile: No results for input(s): CHOL, HDL, LDLCALC, TRIG, CHOLHDL, LDLDIRECT in the last 72 hours. Thyroid Function Tests: No results for input(s): TSH, T4TOTAL, FREET4, T3FREE, THYROIDAB in the last 72 hours. Anemia Panel: No results for input(s): VITAMINB12, FOLATE, FERRITIN, TIBC, IRON, RETICCTPCT in the last 72 hours. Sepsis Labs: No results for input(s): PROCALCITON, LATICACIDVEN in the last 168 hours.  No  results found for this or any previous visit (from the past 240 hours).       Radiology Studies: No results found.         LOS: 21 days   Time spent= 35 mins    Burgess JAYSON Dare, MD [facility]  If 7PM-7AM, please contact night-coverage  05/29/2024, 12:21 PM

## 2024-05-29 NOTE — Plan of Care (Signed)
?  Problem: Nutrition: Goal: Adequate nutrition will be maintained Outcome: Progressing   Problem: Elimination: Goal: Will not experience complications related to urinary retention Outcome: Progressing   Problem: Safety: Goal: Ability to remain free from injury will improve Outcome: Progressing   

## 2024-05-30 ENCOUNTER — Other Ambulatory Visit (HOSPITAL_COMMUNITY): Payer: Self-pay

## 2024-05-30 MED ORDER — ADULT MULTIVITAMIN W/MINERALS CH: 1.0000 | ORAL_TABLET | Freq: Every day | ORAL | Status: DC

## 2024-05-30 MED ORDER — ADULT MULTIVITAMIN W/MINERALS CH
1.0000 | ORAL_TABLET | Freq: Every day | ORAL | 0 refills | Status: AC
  Filled 2024-05-30: qty 100, 100d supply, fill #0

## 2024-05-30 MED ORDER — OLANZAPINE 10 MG IM SOLR: 2.5000 mg | Freq: Once | INTRAMUSCULAR | Status: DC | PRN

## 2024-05-30 MED ORDER — ARIPIPRAZOLE 5 MG PO TABS
5.0000 mg | ORAL_TABLET | Freq: Every day | ORAL | 0 refills | Status: AC
  Filled 2024-05-30: qty 30, 30d supply, fill #0

## 2024-05-30 MED ORDER — ENSURE PLUS HIGH PROTEIN PO LIQD: 237.0000 mL | Freq: Two times a day (BID) | ORAL | Status: AC

## 2024-05-30 NOTE — Evaluation (Signed)
 Occupational Therapy Evaluation Patient Details Name: Carolyn Huynh MRN: [id number] DOB: 1955/06/28 Today's Date: 05/30/2024   History of Present Illness   68 yo F adm 11/19 due to suspected GI bleed. PMH HTN, COPD, Dementia, bipolar, schizophrenia, delusional disorder, ETOH use, recurrent pneumothoraces, diverticulosis     Clinical Impressions PTA, pt lives with daughter and reports typically Independent with ADLs and mobility without need for AD. Pt presents now with deficits in cognition and strength. Pt reports feeling more comfortable using the RW; able to demonstrate in room mobility using RW without issues. Pt demonstrating functional skills to manage ADLs with no more than Supervision though pt declined full ADL retraining this AM. Pt with varying reports of PLOF/abilities but recommend pt to return home with family as long as consistent support is available to assist, especially with med mgmt. Will continue to follow while pt admitted.      If plan is discharge home, recommend the following:   Assistance with cooking/housework;Direct supervision/assist for medications management;Direct supervision/assist for financial management;Assist for transportation;Supervision due to cognitive status     Functional Status Assessment   Patient has had a recent decline in their functional status and demonstrates the ability to make significant improvements in function in a reasonable and predictable amount of time.     Equipment Recommendations   Other (comment) (RW if does not already have at home)     Recommendations for Other Services         Precautions/Restrictions   Precautions Precautions: Fall Recall of Precautions/Restrictions: Impaired Restrictions Weight Bearing Restrictions Per Provider Order: No     Mobility Bed Mobility Overal bed mobility: Modified Independent                  Transfers Overall transfer level: Modified independent Equipment  used: Rolling walker (2 wheels)                      Balance Overall balance assessment: Needs assistance Sitting-balance support: Feet supported Sitting balance-Leahy Scale: Normal     Standing balance support: No upper extremity supported, During functional activity Standing balance-Leahy Scale: Fair                             ADL either performed or assessed with clinical judgement   ADL Overall ADL's : Needs assistance/impaired Eating/Feeding: Independent   Grooming: Modified independent;Standing   Upper Body Bathing: Modified independent   Lower Body Bathing: Supervison/ safety   Upper Body Dressing : Modified independent   Lower Body Dressing: Supervision/safety   Toilet Transfer: Supervision/safety;Ambulation;Rolling walker (2 wheels)   Toileting- Clothing Manipulation and Hygiene: Supervision/safety;Sitting/lateral lean;Sit to/from stand       Functional mobility during ADLs: Supervision/safety;Rolling walker (2 wheels) General ADL Comments: Pt reports feeling shakiness and weaker than normal, also reports dragging LLE. Pt reports feeling more comfortable using RW for mobility, able to demo walking around in room using RW without any safety concerns. Declined to go out in hallway due to not feeling clean yet but declined ADL assist from OT     Vision Ability to See in Adequate Light: 0 Adequate Patient Visual Report: No change from baseline Vision Assessment?: No apparent visual deficits     Perception         Praxis         Pertinent Vitals/Pain Pain Assessment Pain Assessment: No/denies pain     Extremity/Trunk Assessment Upper Extremity Assessment Upper  Extremity Assessment: Overall WFL for tasks assessed;Right hand dominant   Lower Extremity Assessment Lower Extremity Assessment: Defer to PT evaluation   Cervical / Trunk Assessment Cervical / Trunk Assessment: Normal   Communication Communication Communication: No  apparent difficulties   Cognition Arousal: Alert Behavior During Therapy: Flat affect, WFL for tasks assessed/performed Cognition: History of cognitive impairments             OT - Cognition Comments: hx of dementia, schizophrenia and bipolar. Pt with tangential statements, poor overall awareness of situation and delusiuonal statements as well. Unclear PLOF recall. 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                 Following commands: Impaired Following commands impaired: Follows one step commands with increased time     Cueing  General Comments   Cueing Techniques: Verbal cues      Exercises     Shoulder Instructions      Home Living Family/patient expects to be discharged to:: Private residence Living Arrangements: Children Available Help at Discharge: Family Type of Home: House Home Access: Level entry     Home Layout: One level     Bathroom Shower/Tub: Chief Strategy Officer: Standard     Home Equipment: Agricultural Consultant (2 wheels);Cane - single point          Prior Functioning/Environment Prior Level of Function : Independent/Modified Independent;Patient poor historian/Family not available             Mobility Comments: pt reports using cane briefly but typically no Ad ADLs Comments: Pt reports able to manage ADLs w/o issues. noted per chart, pt likely not taking meds as she should    OT Problem List: Decreased activity tolerance;Impaired balance (sitting and/or standing);Decreased cognition;Decreased safety awareness   OT Treatment/Interventions: Self-care/ADL training;Therapeutic exercise;Energy conservation;DME and/or AE instruction;Therapeutic activities;Patient/family education;Balance training      OT Goals(Current goals can be found in the care plan section)   Acute Rehab OT Goals Patient Stated Goal: home soon, get cleaned up sometime today OT Goal  Formulation: With patient Time For Goal Achievement: 06/13/24 Potential to Achieve Goals: Good ADL Goals Pt Will Perform Lower Body Bathing: with modified independence;sit to/from stand Pt Will Transfer to Toilet: with modified independence;ambulating Pt/caregiver will Perform Home Exercise Program: Increased strength;Both right and left upper extremity;With theraband;With Supervision;With written HEP provided Additional ADL Goal #1: Pt to complete functional cognition assessment (ex. pill box test if able) Additional ADL Goal #2: Pt to accurately answer at least 3 safety questions (who to call in emergency, what to do in a fire, etc)   OT Frequency:  Min 2X/week    Co-evaluation              AM-PAC OT 6 Clicks Daily Activity     Outcome Measure Help from another person eating meals?: None Help from another person taking care of personal grooming?: None Help from another person toileting, which includes using toliet, bedpan, or urinal?: A Little Help from another person bathing (including washing, rinsing, drying)?: A Little Help from another person to put on and taking off regular upper body clothing?: None Help from another person to put on and taking off regular lower body clothing?: A Little 6 Click Score: 21   End of Session Equipment Utilized During Treatment: Rolling walker (2 wheels) Nurse Communication: Mobility status  Activity Tolerance: Patient tolerated treatment  well Patient left: in bed;with call bell/phone within reach  OT Visit Diagnosis: Muscle weakness (generalized) (M62.81);Other symptoms and signs involving cognitive function                Time: 9258-9244 OT Time Calculation (min): 14 min Charges:  OT General Charges $OT Visit: 1 Visit OT Evaluation $OT Eval Low Complexity: 1 Low  Mliss NOVAK, OTR/L Acute Rehab Services Office: [phone]   Mliss Fish 05/30/2024, 8:21 AM

## 2024-05-30 NOTE — TOC Transition Note (Addendum)
 Transition of Care [facility]) - Discharge Note   Patient Details  Name: Carolyn Huynh MRN: [id number] Date of Birth: 1955-07-03  [facility] CM/SW Contact:  Marval Gell, RN Phone Number: 05/30/2024, 10:16 AM   Clinical Narrative:     Patient is agitated about leaving. Daughter states she does not have money for the bus, will set up cab to pick up her daughter to come to hospital to assist with getting her home. Cab voucher will also be provided to get them both back home.   11:20 Received call from daughter that she is ready for cab, I have called [employer] to get her to bring her to the hospital, scanned cab voucher to [email or web address]    11:50 Updated leadership, DOROTHA Jes LCSW    Barriers to Discharge:  (Inpatient Pending Psych Consult)   Patient Goals and CMS Choice            Discharge Placement                       Discharge Plan and Services Additional resources added to the After Visit Summary for                                       Social Drivers of Health (SDOH) Interventions SDOH Screenings   Food Insecurity: No Food Insecurity (05/03/2024)  Housing: Low Risk (05/03/2024)  Transportation Needs: Unmet Transportation Needs (05/03/2024)  Utilities: Not At Risk (05/03/2024)  Alcohol Screen: Low Risk (09/09/2021)  Social Connections: Unknown (05/03/2024)  Tobacco Use: Low Risk (05/12/2024)     Readmission Risk Interventions    12/15/2023    4:42 PM  Readmission Risk Prevention Plan  Post Dischage Appt Complete  Medication Screening Complete  Transportation Screening Complete

## 2024-05-30 NOTE — Discharge Summary (Signed)
 Physician Discharge Summary  SHIFA BRISBON FMW:[id number] DOB: 08-11-1955 DOA: 05/03/2024  PCP: Patient, [name]  Admit date: 05/03/2024 Discharge date: 05/30/2024  Admitted From: Home Disposition:  Home  Recommendations for Outpatient Follow-up:  Follow up with PCP in 1-2 weeks Please obtain BMP/CBC in one week your next doctors visit.  Abilify  5mg  po daily per Psych  Discharge Condition: Stable CODE STATUS: Full Diet recommendation: regular  Brief/Interim Summary: Brief Narrative:   68 year old female with medical history significant for hypertension, COPD, dementia, bipolar disorder, longstanding schizophrenia, delusional disorder, alcohol use disorder, behavioral health hospitalization in the past,, recurrent pneumothoraces, alcohol use disorder, diverticulosis, presented to the ED on 05/03/2024 with complaints of weakness, poor oral intake and blood in stools. [facility] evaluated, no evidence of active GI bleeding, CT A/P showed fecal retention and low-grade bleeding was felt to be due to that and stercoral ulceration versus unclear etiology. She declined bowel prep, colonoscopy or flexible sigmoidoscopy and GI signed off 11/21. Completed treatment for perianal fistula/buttock abscess. Psychiatry continues to closely follow and manage multiple behavioral health issues.   Assessment & Plan:     Constipation/obstipation Fecal impaction/stercoral colitis Low-volume rectal bleeding [facility] was consulted, patient declined bowel prep, colonoscopy or flex sigmoidoscopy.  GI signed off 11/21.  Continue to monitor as she appears to be having bowel movements.   Perianal fistula Buttock abscess Per GYN, no surgical intervention.  She has completed course of Zosyn  and now on Augmentin  EOT 12/28.  Local wound care.  Outpatient general surgery and GYN follow-up    Paranoid schizophrenia Delusional disorder Moderate vascular versus Alzheimer's dementia Lacks medical  decision-making capacity. Seen by psychiatry on Abilify  5 mg daily.  Psychiatry now signed off as patient is taking oral medications.   SVT Earlier on in hospital stay.  Resolved   2.9 cm renal cyst Noted on CT A/P.  Will need outpatient follow-up and evaluation and management as deemed necessary.   Anemia Stable.   Nutrition Status: Nutrition Problem: Severe Malnutrition Etiology: social / environmental circumstances Signs/Symptoms: energy intake < 75% for > or equal to 3 months, severe muscle depletion, severe fat depletion Interventions: Ensure Enlive (each supplement provides 350kcal and 20 grams of protein), MVI Management as per registered dietitian input.   Body mass index is 19.55 kg/m.     DVT prophylaxis: enoxaparin     Code Status: Full Code:  Family Communication: None at bedside. Disposition:  Status is: Inpatient Remains inpatient appropriate because: Ongoing psychiatric follow-up with medication adjustments and awaiting safe disposition decision.   Discussed in detail with TOC on 12/11.  They last communicated with family/daughters November and at which time daughters were agreeable to taking patient back home.  Requested TOC to reach back out to family to see if that still is the plan.  Also there were overall concerns that if patient returns home, she will continue to refuse medications, care, MD visits like she has in the past and regress again.  Barrier to SNF placement currently is inadequate insurance.  Difficult situation.  Discussed with TOC, they left a message for patient's family yesterday but have not heard back.   PT Follow up Recs:   Subjective: No complaints doing okay.     Examination:  General exam: Appears calm and comfortable  Respiratory system: Clear to auscultation. Respiratory effort normal. Cardiovascular system: S1 & S2 heard, RRR. No JVD, murmurs, rubs, gallops or clicks. No pedal edema. Gastrointestinal system: Abdomen is  nondistended, soft and nontender.  No organomegaly or masses felt. Normal bowel sounds heard. Central nervous system: Alert and oriented. No focal neurological deficits. Extremities: Symmetric 5 x 5 power. Skin: No rashes, lesions or ulcers Psychiatry: Judgement and insight appear poor   Discharge Diagnoses:  Principal Problem:   Acute GI bleeding Active Problems:   Recurrent spontaneous pneumothorax   Schizophrenia, paranoid type (HCC)   Mixed Alzheimer's and vascular dementia (HCC)   Severe manic bipolar 1 disorder with psychotic behavior (HCC)   Vascular dementia with delusions (HCC)   Hypertension   Severe alcohol use disorder, in sustained remission (HCC)   Constipation   Heme positive stool   Abdominal cramping   Protein-calorie malnutrition, severe   Rectal bleeding   Rectal fistula   Pressure injury of skin      Discharge Exam: Vitals:   05/30/24 0733 05/30/24 1133  BP: 126/72 (!) 158/88  Pulse:  86  Resp: 20 18  Temp: 98.6 F (37 C) 97.9 F (36.6 C)  SpO2:  95%   Vitals:   05/30/24 0030 05/30/24 0453 05/30/24 0733 05/30/24 1133  BP: (!) 146/90 (!) 151/94 126/72 (!) 158/88  Pulse: 70 79  86  Resp:  17 20 18   Temp: 98 F (36.7 C) 98.6 F (37 C) 98.6 F (37 C) 97.9 F (36.6 C)  TempSrc:   Oral   SpO2: 100% 97%  95%  Weight:      Height:          Discharge Instructions   Allergies as of 05/30/2024       Reactions   Aspirin Shortness Of Breath   Shellfish Allergy Anaphylaxis        Medication List     TAKE these medications    ARIPiprazole  5 MG tablet Commonly known as: ABILIFY  Take 1 tablet (5 mg total) by mouth daily.   feeding supplement Liqd Take 237 mLs by mouth 2 (two) times daily between meals.   multivitamin with minerals Tabs tablet Take 1 tablet by mouth daily.        Allergies[1]  You were cared for by a hospitalist during your hospital stay. If you have any questions about your discharge medications or the care  you received while you were in the hospital after you are discharged, you can call the unit and asked to speak with the hospitalist on call if the hospitalist that took care of you is not available. Once you are discharged, your primary care physician will handle any further medical issues. Please note that no refills for any discharge medications will be authorized once you are discharged, as it is imperative that you return to your primary care physician (or establish a relationship with a primary care physician if you do not have one) for your aftercare needs so that they can reassess your need for medications and monitor your lab values.  You were cared for by a hospitalist during your hospital stay. If you have any questions about your discharge medications or the care you received while you were in the hospital after you are discharged, you can call the unit and asked to speak with the hospitalist on call if the hospitalist that took care of you is not available. Once you are discharged, your primary care physician will handle any further medical issues. Please note that NO REFILLS for any discharge medications will be authorized once you are discharged, as it is imperative that you return to your primary care physician (or establish a relationship with a primary  care physician if you do not have one) for your aftercare needs so that they can reassess your need for medications and monitor your lab values.  Please request your Prim.MD to go over all Hospital Tests and Procedure/Radiological results at the follow up, please get all Hospital records sent to your Prim MD by signing hospital release before you go home.  Get CBC, CMP, 2 view Chest X ray checked  by Primary MD during your next visit or SNF MD in 5-7 days ( we routinely change or add medications that can affect your baseline labs and fluid status, therefore we recommend that you get the mentioned basic workup next visit with your PCP, your PCP  may decide not to get them or add new tests based on their clinical decision)  On your next visit with your primary care physician please Get Medicines reviewed and adjusted.  If you experience worsening of your admission symptoms, develop shortness of breath, life threatening emergency, suicidal or homicidal thoughts you must seek medical attention immediately by calling 911 or calling your MD immediately  if symptoms less severe.  You Must read complete instructions/literature along with all the possible adverse reactions/side effects for all the Medicines you take and that have been prescribed to you. Take any new Medicines after you have completely understood and accpet all the possible adverse reactions/side effects.   Do not drive, operate heavy machinery, perform activities at heights, swimming or participation in water  activities or provide baby sitting services if your were admitted for syncope or siezures until you have seen by Primary MD or a Neurologist and advised to do so again.  Do not drive when taking Pain medications.   Procedures/Studies: CT PELVIS W CONTRAST Result Date: 05/08/2024 CLINICAL DATA:  Purulent drainage from both groin regions. EXAM: CT PELVIS WITH CONTRAST TECHNIQUE: Multidetector CT imaging of the pelvis was performed using the standard protocol following the bolus administration of intravenous contrast. RADIATION DOSE REDUCTION: This exam was performed according to the departmental dose-optimization program which includes automated exposure control, adjustment of the mA and/or kV according to patient size and/or use of iterative reconstruction technique. CONTRAST:  75mL OMNIPAQUE  IOHEXOL  350 MG/ML SOLN COMPARISON:  05/03/2024 FINDINGS: Urinary Tract: The urinary bladder appears normal for the degree of distention. Bowel: Prominent stool volume in pelvic colonic loops. No small bowel dilatation in the pelvis. Vascular/Lymphatic: There is moderate atherosclerotic  calcification of the abdominal aorta without aneurysm. No pelvic sidewall lymphadenopathy. No groin lymphadenopathy. Reproductive:  There is no adnexal mass. Other:  No intraperitoneal free fluid. Musculoskeletal: Skin thickening and areas of enhancement are seen in the inguinal folds bilaterally. Tiny gas bubbles in this region bilaterally are probably gas trapped in the inguinal folds. There is no rim enhancing discrete drainable abscess on either side. A linear band of enhancing soft tissue attenuation is seen just deep to the right skin posteriorly along the intergluteal fold (axial 47/2) that appears to track up to the region of the anus. IMPRESSION: 1. Skin thickening and areas of enhancement in the inguinal folds bilaterally. Tiny gas bubbles in this region bilaterally are probably gas trapped in the inguinal folds. There is no rim enhancing discrete drainable abscess on either side. Imaging features raise concern for cellulitis. 2. Linear band of enhancing soft tissue attenuation is seen just deep to the right skin posteriorly along the intergluteal fold that appears to track up to the region of the anus. Imaging features are highly suggestive of a perianal fistula. Pelvic  MRI with and without contrast could be used to further characterize as clinically warranted. 3. Prominent stool volume in pelvic colonic loops. 4.  Aortic Atherosclerosis (ICD10-I70.0). Electronically Signed   By: Camellia Candle M.D.   On: 05/08/2024 10:30   CT ABDOMEN PELVIS WO CONTRAST Result Date: 05/03/2024 CLINICAL DATA:  Left lower quadrant pain. Bright red blood per rectum. EXAM: CT ABDOMEN AND PELVIS WITHOUT CONTRAST TECHNIQUE: Multidetector CT imaging of the abdomen and pelvis was performed following the standard protocol without IV contrast. RADIATION DOSE REDUCTION: This exam was performed according to the departmental dose-optimization program which includes automated exposure control, adjustment of the mA and/or kV  according to patient size and/or use of iterative reconstruction technique. COMPARISON:  12/11/2023 FINDINGS: Lower chest: Heart is normal size. Visualized lung bases demonstrate emphysematous disease with associated mild interstitial prominence. No airspace disease or effusion. Hepatobiliary: Gallbladder not visualized. Minimal prominence of the main common bile duct and central intrahepatic ducts unchanged. Liver is otherwise unremarkable. Pancreas: Normal. Spleen: Normal. Adrenals/Urinary Tract: Adrenal glands are normal. Kidneys are normal in size without hydronephrosis or nephrolithiasis. Stable 2.9 cm cyst over the lower pole right kidney. Ureters and bladder are normal. Stomach/Bowel: Stomach is normal. Small bowel is unremarkable. Moderate fecal retention within the colon most prominent over the rectosigmoid colon. Appendix not visualized. Vascular/Lymphatic: Mild calcified plaque over the abdominal aorta which is normal caliber. No adenopathy. Reproductive: Uterus and bilateral adnexa are unremarkable. 1 cm calcified uterine fibroids present. Other: Moderate umbilical hernia containing peritoneal fat and fluid without significant change. There is an adjacent periumbilical hernia just left of midline containing only peritoneal fat unchanged. No free peritoneal fluid or focal inflammatory change. Musculoskeletal: No focal abnormality. IMPRESSION: 1. No acute findings in the abdomen/pelvis. 2. Moderate fecal retention within the colon most prominent over the rectosigmoid colon. 3. Stable 2.9 cm right renal cyst. 4. Stable moderate umbilical hernia and stable adjacent periumbilical hernia just left of midline both containing only peritoneal fat. 5. Aortic atherosclerosis. 6. Emphysematous disease with associated mild interstitial prominence over the lung bases. Aortic Atherosclerosis (ICD10-I70.0) and Emphysema (ICD10-J43.9). Electronically Signed   By: Toribio Agreste M.D.   On: 05/03/2024 16:35   CT Head Wo  Contrast Result Date: 05/03/2024 EXAM: CT HEAD WITHOUT CONTRAST 05/03/2024 06:16:30 AM TECHNIQUE: CT of the head was performed without the administration of intravenous contrast. Automated exposure control, iterative reconstruction, and/or weight based adjustment of the mA/kV was utilized to reduce the radiation dose to as low as reasonably achievable. COMPARISON: Head CT 12/11/2023. CLINICAL HISTORY: 68 year old female with mental status change of unknown cause. FINDINGS: BRAIN AND VENTRICLES: No acute hemorrhage. No evidence of acute infarct. No hydrocephalus. No extra-axial collection. No mass effect or midline shift. Brain volume stable and normal for age. Patchy mild to moderate bilateral cerebral white matter hypodensity in both hemispheres is stable, more conspicuous on the left. Faint right side basal ganglia calcifications are stable. No suspicious intracranial vascular hyperdensity. ORBITS: No acute abnormality. SINUSES: Paranasal sinuses, middle ears and mastoids remain well aerated. SOFT TISSUES AND SKULL: No acute soft tissue abnormality. No skull fracture. IMPRESSION: 1. No acute intracranial abnormality. 2. Stable moderate for age cerebral white matter disease, most commonly due to small vessel ischemia. Electronically signed by: Helayne Hurst MD 05/03/2024 06:23 AM EST RP Workstation: [id number]     The results of significant diagnostics from this hospitalization (including imaging, microbiology, ancillary and laboratory) are listed below for reference.     Microbiology: No results found  for this or any previous visit (from the past 240 hours).   Labs: BNP (last 3 results) No results for input(s): BNP in the last 8760 hours. Basic Metabolic Panel: Recent Labs  Lab 05/24/24 0437 05/27/24 0516  NA 138  --   K 3.9  --   CL 104  --   CO2 29  --   GLUCOSE 83  --   BUN 12  --   CREATININE 0.78 0.67  CALCIUM 8.5*  --    Liver Function Tests: No results for input(s): AST,  ALT, ALKPHOS, BILITOT, PROT, ALBUMIN in the last 168 hours. No results for input(s): LIPASE, AMYLASE in the last 168 hours. No results for input(s): AMMONIA in the last 168 hours. CBC: Recent Labs  Lab 05/24/24 0437  WBC 5.2  HGB 11.2*  HCT 35.1*  MCV 91.4  PLT 331   Cardiac Enzymes: No results for input(s): CKTOTAL, CKMB, CKMBINDEX, TROPONINI in the last 168 hours. BNP: Invalid input(s): POCBNP CBG: No results for input(s): GLUCAP in the last 168 hours. D-Dimer No results for input(s): DDIMER in the last 72 hours. Hgb A1c No results for input(s): HGBA1C in the last 72 hours. Lipid Profile No results for input(s): CHOL, HDL, LDLCALC, TRIG, CHOLHDL, LDLDIRECT in the last 72 hours. Thyroid function studies No results for input(s): TSH, T4TOTAL, T3FREE, THYROIDAB in the last 72 hours.  Invalid input(s): FREET3 Anemia work up No results for input(s): VITAMINB12, FOLATE, FERRITIN, TIBC, IRON, RETICCTPCT in the last 72 hours. Urinalysis    Component Value Date/Time   COLORURINE AMBER (A) 05/03/2024 1233   APPEARANCEUR CLOUDY (A) 05/03/2024 1233   LABSPEC 1.024 05/03/2024 1233   PHURINE 5.0 05/03/2024 1233   GLUCOSEU 50 (A) 05/03/2024 1233   HGBUR LARGE (A) 05/03/2024 1233   BILIRUBINUR NEGATIVE 05/03/2024 1233   KETONESUR 5 (A) 05/03/2024 1233   PROTEINUR 100 (A) 05/03/2024 1233   NITRITE NEGATIVE 05/03/2024 1233   LEUKOCYTESUR LARGE (A) 05/03/2024 1233   Sepsis Labs Recent Labs  Lab 05/24/24 0437  WBC 5.2   Microbiology No results found for this or any previous visit (from the past 240 hours).   Time coordinating discharge:  I have spent 35 minutes face to face with the patient and on the ward discussing the patients care, assessment, plan and disposition with other care givers. >50% of the time was devoted counseling the patient about the risks and benefits of treatment/Discharge disposition and  coordinating care.   SIGNED:   Burgess JAYSON Dare, MD  [facility] 05/30/2024, 1:07 PM   If 7PM-7AM, please contact night-coverage      [1]  Allergies Allergen Reactions   Aspirin Shortness Of Breath   Shellfish Allergy Anaphylaxis

## 2024-05-30 NOTE — Progress Notes (Signed)
 PT Cancellation Note  Patient Details Name: Carolyn Huynh MRN: [id number] DOB: Mar 23, 1956   Cancelled Treatment:    Reason Eval/Treat Not Completed: Patient declined, no reason specified  Bathing at the sink. Requests therapist come back later. Will check back when schedule allows. Looks like she did very well with OT evaluation earlier.  Leontine Roads, PT, DPT [facility]  [facility] Services Physical Therapist Office: [phone] Website: [email or web address]  Leontine GORMAN Roads 05/30/2024, 9:09 AM

## 2024-05-30 NOTE — Plan of Care (Signed)

## 2024-05-30 NOTE — Progress Notes (Addendum)
 AVS completed for discharge packet and placed with chart. Primary nurse notified

## 2024-05-30 NOTE — Progress Notes (Signed)
 PROGRESS NOTE    Carolyn Huynh  FMW:[id number] DOB: 1955-12-18 DOA: 05/03/2024 PCP: Patient, [name]    Brief Narrative:   68 year old female with medical history significant for hypertension, COPD, dementia, bipolar disorder, longstanding schizophrenia, delusional disorder, alcohol use disorder, behavioral health hospitalization in the past,, recurrent pneumothoraces, alcohol use disorder, diverticulosis, presented to the ED on 05/03/2024 with complaints of weakness, poor oral intake and blood in stools. [facility] evaluated, no evidence of active GI bleeding, CT A/P showed fecal retention and low-grade bleeding was felt to be due to that and stercoral ulceration versus unclear etiology. She declined bowel prep, colonoscopy or flexible sigmoidoscopy and GI signed off 11/21. Completed treatment for perianal fistula/buttock abscess. Psychiatry continues to closely follow and manage multiple behavioral health issues.   Assessment & Plan:     Constipation/obstipation Fecal impaction/stercoral colitis Low-volume rectal bleeding  GI was consulted, patient declined bowel prep, colonoscopy or flex sigmoidoscopy.  GI signed off 11/21.  Continue to monitor as she appears to be having bowel movements.   Perianal fistula Buttock abscess Per GYN, no surgical intervention.  She has completed course of Zosyn  and now on Augmentin  EOT 12/28.  Local wound care.  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 Seen by psychiatry on Abilify  5 mg daily.  Psychiatry now signed off as patient is taking oral medications.   SVT Earlier on in hospital stay.  Resolved   2.9 cm renal cyst Noted on CT A/P.  Will need outpatient follow-up and evaluation and management as deemed necessary.   Anemia Stable.   Nutrition Status: Nutrition Problem: Severe  Malnutrition Etiology: social / environmental circumstances Signs/Symptoms: energy intake < 75% for > or equal to 3 months, severe muscle depletion, severe fat depletion Interventions: Ensure Enlive (each supplement provides 350kcal and 20 grams of protein), MVI Management as per registered dietitian input.   Body mass index is 19.55 kg/m.     DVT prophylaxis: enoxaparin     Code Status: Full Code:  Family Communication: None at bedside. Disposition:  Status is: Inpatient Remains inpatient appropriate because: Ongoing psychiatric follow-up with medication adjustments and awaiting safe disposition decision.   Discussed in detail with TOC on 12/11.  They last communicated with family/daughters November and at which time daughters were agreeable to taking patient back home.  Requested TOC to reach back out to family to see if that still is the plan.  Also there were overall concerns that if patient returns home, she will continue to refuse medications, care, MD visits like she has in the past and regress again.  Barrier to SNF placement currently is inadequate insurance.  Difficult situation.  Discussed with TOC, they left a message for patient's family yesterday but have not heard back.   PT Follow up Recs:   Subjective: No complaints doing okay.     Examination:  General exam: Appears calm and comfortable  Respiratory system: Clear to auscultation. Respiratory effort normal. Cardiovascular system: S1 & S2 heard, RRR. No JVD, murmurs, rubs, gallops or clicks. No pedal edema. Gastrointestinal system: Abdomen is nondistended, soft and nontender. No organomegaly or masses felt. Normal bowel sounds heard. Central nervous system: Alert and oriented. No focal neurological deficits. Extremities: Symmetric 5 x 5 power. Skin: No rashes, lesions or ulcers Psychiatry: Judgement and insight appear poor           Wound 05/09/24 Pressure Injury Buttocks Right Stage  4 - Full thickness  tissue loss with exposed bone, tendon or muscle. (Active)     Diet Orders (From admission, onward)     Start     Ordered   05/05/24 1337  Diet regular Room service appropriate? Yes; Fluid consistency: Thin  Diet effective now       Question Answer Comment  Room service appropriate? Yes   Fluid consistency: Thin      05/05/24 1337            Objective: Vitals:   05/29/24 2143 05/30/24 0030 05/30/24 0453 05/30/24 0733  BP: (!) 145/82 (!) 146/90 (!) 151/94 126/72  Pulse:  70 79   Resp:   17 20  Temp:  98 F (36.7 C) 98.6 F (37 C) 98.6 F (37 C)  TempSrc:    Oral  SpO2:  100% 97%   Weight:      Height:       No intake or output data in the 24 hours ending 05/30/24 1014 Filed Weights   05/05/24 1522 05/11/24 1857 05/16/24 1704  Weight: 47.2 kg 47.2 kg 45.4 kg    Scheduled Meds:  ARIPiprazole   5 mg Oral Daily   ARIPiprazole  ER  200 mg Intramuscular Q28 days   enoxaparin  (LOVENOX ) injection  40 mg Subcutaneous Q24H   feeding supplement  237 mL Oral BID BM   Gerhardt's butt cream   Topical BID   multivitamin with minerals  1 tablet Oral Daily   nutrition supplement (JUVEN)  1 packet Oral BID BM   sodium chloride  flush  3 mL Intravenous Q12H   Continuous Infusions:  Nutritional status Signs/Symptoms: energy intake < 75% for > or equal to 3 months, severe muscle depletion, severe fat depletion Interventions: Ensure Enlive (each supplement provides 350kcal and 20 grams of protein), MVI Body mass index is 19.55 kg/m.  Data Reviewed:   CBC: Recent Labs  Lab 05/24/24 0437  WBC 5.2  HGB 11.2*  HCT 35.1*  MCV 91.4  PLT 331   Basic Metabolic Panel: Recent Labs  Lab 05/24/24 0437 05/27/24 0516  NA 138  --   K 3.9  --   CL 104  --   CO2 29  --   GLUCOSE 83  --   BUN 12  --   CREATININE 0.78 0.67  CALCIUM 8.5*  --    GFR: Estimated Creatinine Clearance: 46.9 mL/min (by C-G formula based on SCr of 0.67 mg/dL). Liver Function Tests: No results for  input(s): AST, ALT, ALKPHOS, BILITOT, PROT, ALBUMIN in the last 168 hours. No results for input(s): LIPASE, AMYLASE in the last 168 hours. No results for input(s): AMMONIA in the last 168 hours. Coagulation Profile: No results for input(s): INR, PROTIME in the last 168 hours. Cardiac Enzymes: No results for input(s): CKTOTAL, CKMB, CKMBINDEX, TROPONINI in the last 168 hours. BNP (last 3 results) No results for input(s): PROBNP in the last 8760 hours. HbA1C: No results for input(s): HGBA1C in the last 72 hours. CBG: No results for input(s): GLUCAP in the last 168 hours. Lipid Profile: No results for input(s): CHOL, HDL, LDLCALC, TRIG, CHOLHDL, LDLDIRECT in the last 72 hours. Thyroid Function Tests: No results for input(s): TSH, T4TOTAL, FREET4, T3FREE, THYROIDAB in the last 72 hours. Anemia Panel: No results for input(s): VITAMINB12, FOLATE, FERRITIN, TIBC, IRON, RETICCTPCT in the last 72 hours. Sepsis Labs: No results for input(s): PROCALCITON, LATICACIDVEN in the last 168 hours.  No results found for this or any previous visit (from  the past 240 hours).       Radiology Studies: No results found.         LOS: 22 days   Time spent= 35 mins    Burgess JAYSON Dare, MD [facility]  If 7PM-7AM, please contact night-coverage  05/30/2024, 10:14 AM

## 2024-05-30 NOTE — Evaluation (Signed)
 Physical Therapy Evaluation and Discharge Patient Details Name: Carolyn Huynh MRN: [id number] DOB: 1956-01-20 Today's Date: 05/30/2024  History of Present Illness  68 yo F adm 11/19 due to suspected GI bleed. PMH HTN, COPD, Dementia, bipolar, schizophrenia, delusional disorder, ETOH use, recurrent pneumothoraces, diverticulosis.  Clinical Impression  Patient evaluated by Physical Therapy with no further acute PT needs identified. All education has been completed and the patient has no further questions. Able to transfer at Mod I level. Supervision for safety with RW to ambulate >200 feet - no signs of LOB or buckling. Short distance in room without AD, less confident, furniture surfing. Recommend she use her RW at home and is amenable. Lives with daughter. Suspect pt mobility at baseline. Patient without concerns or complaints regarding current functional status. See below for any follow-up Physical Therapy or equipment needs. PT is signing off. Thank you for this referral.         If plan is discharge home, recommend the following: Assistance with cooking/housework;Assist for transportation;Supervision due to cognitive status   Can travel by private vehicle        Equipment Recommendations None recommended by PT  Recommendations for Other Services       Functional Status Assessment Patient has had a recent decline in their functional status and demonstrates the ability to make significant improvements in function in a reasonable and predictable amount of time.     Precautions / Restrictions Precautions Precautions: Fall Recall of Precautions/Restrictions: Impaired Restrictions Weight Bearing Restrictions Per Provider Order: No      Mobility  Bed Mobility Overal bed mobility: Modified Independent                  Transfers Overall transfer level: Modified independent Equipment used: Rolling walker (2 wheels)               General transfer comment: Stable upon  rising with RW for light support.    Ambulation/Gait Ambulation/Gait assistance: Supervision Gait Distance (Feet): 225 Feet Assistive device: Rolling walker (2 wheels), None Gait Pattern/deviations: Step-through pattern, Decreased stride length Gait velocity: dec Gait velocity interpretation: <1.8 ft/sec, indicate of risk for recurrent falls   General Gait Details: Grossly stable without evidence of LOB or buckling. Lightly reliant on device and feels more confident with this AD for support. Educated on proper use and placement for support. Completed short in-room distance without AD, appears less confident, reaching for furniture but no overt LOB.  Stairs            Wheelchair Mobility     Tilt Bed    Modified Rankin (Stroke Patients Only)       Balance Overall balance assessment: Needs assistance Sitting-balance support: Feet supported, No upper extremity supported Sitting balance-Leahy Scale: Normal     Standing balance support: No upper extremity supported, During functional activity Standing balance-Leahy Scale: Fair                               Pertinent Vitals/Pain Pain Assessment Pain Assessment: No/denies pain    Home Living Family/patient expects to be discharged to:: Private residence Living Arrangements: Children Available Help at Discharge: Family Type of Home: House Home Access: Level entry       Home Layout: One level Home Equipment: Agricultural Consultant (2 wheels);Cane - single point Additional Comments: pt with some confusion, PLOF and home set up info may need to be verified by family  Prior Function Prior Level of Function : Independent/Modified Independent;Patient poor historian/Family not available             Mobility Comments: pt reports using cane briefly but typically no Ad ADLs Comments: Pt reports able to manage ADLs w/o issues. noted per chart, pt likely not taking meds as she should     Extremity/Trunk  Assessment   Upper Extremity Assessment Upper Extremity Assessment: Defer to OT evaluation    Lower Extremity Assessment Lower Extremity Assessment: Generalized weakness    Cervical / Trunk Assessment Cervical / Trunk Assessment: Normal  Communication   Communication Communication: No apparent difficulties    Cognition Arousal: Alert Behavior During Therapy: Flat affect, WFL for tasks assessed/performed   PT - Cognitive impairments: History of cognitive impairments, No family/caregiver present to determine baseline                         Following commands: Impaired Following commands impaired: Follows one step commands with increased time     Cueing Cueing Techniques: Verbal cues     General Comments General comments (skin integrity, edema, etc.): Needs redirected at times.    Exercises     Assessment/Plan    PT Assessment Patient does not need any further PT services  PT Problem List         PT Treatment Interventions      PT Goals (Current goals can be found in the Care Plan section)  Acute Rehab PT Goals Patient Stated Goal: None stated PT Goal Formulation: All assessment and education complete, DC therapy    Frequency       Co-evaluation               AM-PAC PT 6 Clicks Mobility  Outcome Measure Help needed turning from your back to your side while in a flat bed without using bedrails?: None Help needed moving from lying on your back to sitting on the side of a flat bed without using bedrails?: None Help needed moving to and from a bed to a chair (including a wheelchair)?: None Help needed standing up from a chair using your arms (e.g., wheelchair or bedside chair)?: None Help needed to walk in hospital room?: A Little Help needed climbing 3-5 steps with a railing? : A Little 6 Click Score: 22    End of Session Equipment Utilized During Treatment: Gait belt Activity Tolerance: Patient tolerated treatment well Patient left: in  chair;with call bell/phone within reach;with chair alarm set   PT Visit Diagnosis: Unsteadiness on feet (R26.81);Other abnormalities of gait and mobility (R26.89);Muscle weakness (generalized) (M62.81)    Time: 8983-8968 PT Time Calculation (min) (ACUTE ONLY): 15 min   Charges:   PT Evaluation $PT Eval Low Complexity: 1 Low   PT General Charges $$ ACUTE PT VISIT: 1 Visit         Leontine Roads, PT, DPT [facility]  Rehabilitation Services Physical Therapist Office: [phone] Website: [email or web address]   Leontine GORMAN Roads 05/30/2024, 11:19 AM
